# Patient Record
Sex: Male | Born: 1935 | Race: White | Hispanic: No | Marital: Married | Smoking: Former smoker
Health system: Southern US, Community
[De-identification: ages and names within clinical notes are randomized; demographics above are authoritative.]

## PROBLEM LIST (undated history)

## (undated) DIAGNOSIS — I10 Essential (primary) hypertension: Secondary | ICD-10-CM

## (undated) DIAGNOSIS — E119 Type 2 diabetes mellitus without complications: Secondary | ICD-10-CM

## (undated) DIAGNOSIS — I9789 Other postprocedural complications and disorders of the circulatory system, not elsewhere classified: Secondary | ICD-10-CM

## (undated) DIAGNOSIS — C61 Malignant neoplasm of prostate: Secondary | ICD-10-CM

## (undated) DIAGNOSIS — G459 Transient cerebral ischemic attack, unspecified: Secondary | ICD-10-CM

## (undated) DIAGNOSIS — K219 Gastro-esophageal reflux disease without esophagitis: Secondary | ICD-10-CM

## (undated) DIAGNOSIS — E1142 Type 2 diabetes mellitus with diabetic polyneuropathy: Secondary | ICD-10-CM

## (undated) DIAGNOSIS — E162 Hypoglycemia, unspecified: Secondary | ICD-10-CM

## (undated) DIAGNOSIS — M1990 Unspecified osteoarthritis, unspecified site: Secondary | ICD-10-CM

## (undated) DIAGNOSIS — I2510 Atherosclerotic heart disease of native coronary artery without angina pectoris: Secondary | ICD-10-CM

## (undated) DIAGNOSIS — I639 Cerebral infarction, unspecified: Secondary | ICD-10-CM

## (undated) DIAGNOSIS — I4891 Unspecified atrial fibrillation: Secondary | ICD-10-CM

## (undated) DIAGNOSIS — E785 Hyperlipidemia, unspecified: Secondary | ICD-10-CM

## (undated) HISTORY — DX: Gastro-esophageal reflux disease without esophagitis: K21.9

## (undated) HISTORY — DX: Atherosclerotic heart disease of native coronary artery without angina pectoris: I25.10

## (undated) HISTORY — PX: CATARACT EXTRACTION W/ INTRAOCULAR LENS  IMPLANT, BILATERAL: SHX1307

## (undated) HISTORY — DX: Hyperlipidemia, unspecified: E78.5

## (undated) HISTORY — PX: TONSILLECTOMY: SUR1361

## (undated) HISTORY — DX: Essential (primary) hypertension: I10

---

## 2015-07-16 HISTORY — PX: CORONARY ARTERY BYPASS GRAFT: SHX141

## 2015-07-16 HISTORY — PX: CARDIAC CATHETERIZATION: SHX172

## 2016-07-16 DIAGNOSIS — E119 Type 2 diabetes mellitus without complications: Secondary | ICD-10-CM | POA: Diagnosis not present

## 2016-08-08 DIAGNOSIS — R269 Unspecified abnormalities of gait and mobility: Secondary | ICD-10-CM | POA: Diagnosis not present

## 2016-08-12 DIAGNOSIS — E119 Type 2 diabetes mellitus without complications: Secondary | ICD-10-CM | POA: Diagnosis not present

## 2016-08-13 DIAGNOSIS — R269 Unspecified abnormalities of gait and mobility: Secondary | ICD-10-CM | POA: Diagnosis not present

## 2016-08-19 DIAGNOSIS — E119 Type 2 diabetes mellitus without complications: Secondary | ICD-10-CM | POA: Diagnosis not present

## 2016-08-19 DIAGNOSIS — E7800 Pure hypercholesterolemia, unspecified: Secondary | ICD-10-CM | POA: Diagnosis not present

## 2016-08-20 DIAGNOSIS — R269 Unspecified abnormalities of gait and mobility: Secondary | ICD-10-CM | POA: Diagnosis not present

## 2016-08-22 DIAGNOSIS — R269 Unspecified abnormalities of gait and mobility: Secondary | ICD-10-CM | POA: Diagnosis not present

## 2016-08-26 DIAGNOSIS — R269 Unspecified abnormalities of gait and mobility: Secondary | ICD-10-CM | POA: Diagnosis not present

## 2016-08-30 DIAGNOSIS — R269 Unspecified abnormalities of gait and mobility: Secondary | ICD-10-CM | POA: Diagnosis not present

## 2016-09-03 DIAGNOSIS — R269 Unspecified abnormalities of gait and mobility: Secondary | ICD-10-CM | POA: Diagnosis not present

## 2016-09-04 DIAGNOSIS — H905 Unspecified sensorineural hearing loss: Secondary | ICD-10-CM | POA: Diagnosis not present

## 2016-09-04 DIAGNOSIS — R42 Dizziness and giddiness: Secondary | ICD-10-CM | POA: Diagnosis not present

## 2016-09-05 DIAGNOSIS — R269 Unspecified abnormalities of gait and mobility: Secondary | ICD-10-CM | POA: Diagnosis not present

## 2016-09-10 DIAGNOSIS — R269 Unspecified abnormalities of gait and mobility: Secondary | ICD-10-CM | POA: Diagnosis not present

## 2016-09-12 DIAGNOSIS — R269 Unspecified abnormalities of gait and mobility: Secondary | ICD-10-CM | POA: Diagnosis not present

## 2016-09-18 DIAGNOSIS — R269 Unspecified abnormalities of gait and mobility: Secondary | ICD-10-CM | POA: Diagnosis not present

## 2016-09-20 DIAGNOSIS — R269 Unspecified abnormalities of gait and mobility: Secondary | ICD-10-CM | POA: Diagnosis not present

## 2016-09-23 DIAGNOSIS — R269 Unspecified abnormalities of gait and mobility: Secondary | ICD-10-CM | POA: Diagnosis not present

## 2016-09-25 DIAGNOSIS — R269 Unspecified abnormalities of gait and mobility: Secondary | ICD-10-CM | POA: Diagnosis not present

## 2016-09-30 DIAGNOSIS — R269 Unspecified abnormalities of gait and mobility: Secondary | ICD-10-CM | POA: Diagnosis not present

## 2016-10-01 DIAGNOSIS — H818X3 Other disorders of vestibular function, bilateral: Secondary | ICD-10-CM | POA: Diagnosis not present

## 2016-10-02 DIAGNOSIS — R269 Unspecified abnormalities of gait and mobility: Secondary | ICD-10-CM | POA: Diagnosis not present

## 2016-10-04 DIAGNOSIS — H905 Unspecified sensorineural hearing loss: Secondary | ICD-10-CM | POA: Diagnosis not present

## 2016-10-04 DIAGNOSIS — R42 Dizziness and giddiness: Secondary | ICD-10-CM | POA: Diagnosis not present

## 2016-10-08 DIAGNOSIS — R269 Unspecified abnormalities of gait and mobility: Secondary | ICD-10-CM | POA: Diagnosis not present

## 2016-10-08 DIAGNOSIS — E0842 Diabetes mellitus due to underlying condition with diabetic polyneuropathy: Secondary | ICD-10-CM | POA: Diagnosis not present

## 2016-10-08 DIAGNOSIS — R413 Other amnesia: Secondary | ICD-10-CM | POA: Diagnosis not present

## 2016-10-09 DIAGNOSIS — R269 Unspecified abnormalities of gait and mobility: Secondary | ICD-10-CM | POA: Diagnosis not present

## 2016-12-05 DIAGNOSIS — I10 Essential (primary) hypertension: Secondary | ICD-10-CM | POA: Diagnosis not present

## 2016-12-05 DIAGNOSIS — I2510 Atherosclerotic heart disease of native coronary artery without angina pectoris: Secondary | ICD-10-CM | POA: Diagnosis not present

## 2016-12-05 DIAGNOSIS — E119 Type 2 diabetes mellitus without complications: Secondary | ICD-10-CM | POA: Diagnosis not present

## 2016-12-06 DIAGNOSIS — R300 Dysuria: Secondary | ICD-10-CM | POA: Diagnosis not present

## 2016-12-11 DIAGNOSIS — I10 Essential (primary) hypertension: Secondary | ICD-10-CM | POA: Diagnosis not present

## 2016-12-11 DIAGNOSIS — I480 Paroxysmal atrial fibrillation: Secondary | ICD-10-CM | POA: Diagnosis not present

## 2016-12-11 DIAGNOSIS — E782 Mixed hyperlipidemia: Secondary | ICD-10-CM | POA: Diagnosis not present

## 2016-12-11 DIAGNOSIS — I25119 Atherosclerotic heart disease of native coronary artery with unspecified angina pectoris: Secondary | ICD-10-CM | POA: Diagnosis not present

## 2017-01-02 DIAGNOSIS — I2510 Atherosclerotic heart disease of native coronary artery without angina pectoris: Secondary | ICD-10-CM | POA: Diagnosis not present

## 2017-01-02 DIAGNOSIS — I10 Essential (primary) hypertension: Secondary | ICD-10-CM | POA: Diagnosis not present

## 2017-01-02 DIAGNOSIS — E119 Type 2 diabetes mellitus without complications: Secondary | ICD-10-CM | POA: Diagnosis not present

## 2017-01-28 DIAGNOSIS — N401 Enlarged prostate with lower urinary tract symptoms: Secondary | ICD-10-CM | POA: Diagnosis not present

## 2017-01-28 DIAGNOSIS — C61 Malignant neoplasm of prostate: Secondary | ICD-10-CM | POA: Diagnosis not present

## 2017-01-28 DIAGNOSIS — R351 Nocturia: Secondary | ICD-10-CM | POA: Diagnosis not present

## 2017-03-25 DIAGNOSIS — Z23 Encounter for immunization: Secondary | ICD-10-CM | POA: Diagnosis not present

## 2017-03-25 DIAGNOSIS — K219 Gastro-esophageal reflux disease without esophagitis: Secondary | ICD-10-CM | POA: Diagnosis not present

## 2017-03-25 DIAGNOSIS — E785 Hyperlipidemia, unspecified: Secondary | ICD-10-CM | POA: Diagnosis not present

## 2017-03-25 DIAGNOSIS — E119 Type 2 diabetes mellitus without complications: Secondary | ICD-10-CM | POA: Diagnosis not present

## 2017-06-27 DIAGNOSIS — E119 Type 2 diabetes mellitus without complications: Secondary | ICD-10-CM | POA: Diagnosis not present

## 2017-06-27 DIAGNOSIS — E785 Hyperlipidemia, unspecified: Secondary | ICD-10-CM | POA: Diagnosis not present

## 2017-06-27 DIAGNOSIS — I2510 Atherosclerotic heart disease of native coronary artery without angina pectoris: Secondary | ICD-10-CM | POA: Diagnosis not present

## 2017-08-11 DIAGNOSIS — I25810 Atherosclerosis of coronary artery bypass graft(s) without angina pectoris: Secondary | ICD-10-CM | POA: Diagnosis not present

## 2017-08-11 DIAGNOSIS — C61 Malignant neoplasm of prostate: Secondary | ICD-10-CM | POA: Diagnosis not present

## 2017-08-11 DIAGNOSIS — E119 Type 2 diabetes mellitus without complications: Secondary | ICD-10-CM | POA: Diagnosis not present

## 2017-08-11 DIAGNOSIS — Z7689 Persons encountering health services in other specified circumstances: Secondary | ICD-10-CM | POA: Diagnosis not present

## 2017-08-11 DIAGNOSIS — G629 Polyneuropathy, unspecified: Secondary | ICD-10-CM | POA: Diagnosis not present

## 2017-08-11 DIAGNOSIS — Z794 Long term (current) use of insulin: Secondary | ICD-10-CM | POA: Diagnosis not present

## 2017-11-11 DIAGNOSIS — E1165 Type 2 diabetes mellitus with hyperglycemia: Secondary | ICD-10-CM | POA: Diagnosis not present

## 2017-11-11 DIAGNOSIS — E7800 Pure hypercholesterolemia, unspecified: Secondary | ICD-10-CM | POA: Diagnosis not present

## 2017-11-11 DIAGNOSIS — E1142 Type 2 diabetes mellitus with diabetic polyneuropathy: Secondary | ICD-10-CM | POA: Diagnosis not present

## 2017-11-11 DIAGNOSIS — Z8546 Personal history of malignant neoplasm of prostate: Secondary | ICD-10-CM | POA: Diagnosis not present

## 2017-11-11 DIAGNOSIS — G629 Polyneuropathy, unspecified: Secondary | ICD-10-CM | POA: Diagnosis not present

## 2017-11-11 DIAGNOSIS — I25810 Atherosclerosis of coronary artery bypass graft(s) without angina pectoris: Secondary | ICD-10-CM | POA: Diagnosis not present

## 2017-11-11 DIAGNOSIS — Z794 Long term (current) use of insulin: Secondary | ICD-10-CM | POA: Diagnosis not present

## 2017-11-21 ENCOUNTER — Ambulatory Visit: Payer: Medicare Other | Admitting: Cardiology

## 2017-11-24 NOTE — Progress Notes (Signed)
Cardiology Office Note   Date:  11/27/2017   ID:  Andre Wolfe, DOB 1934/08/07, MRN [id number]  PCP:  [name], MD  Cardiologist:   Orton Capell Martinique, MD   Chief Complaint  Patient presents with  . New Patient (Initial Visit)      History of Present Illness: Andre Wolfe is a 83 y.o. male who presents for evaluation of CAD at the request of Dr. Jacelyn Grip. He moved recently from the [place] area to be closer to family. 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 In 2017 he began having angina and had an abnormal stress test. This led to cardiac cath and subsequent CABG in [place]. He had post op AFib and was on amiodarone for a few weeks and anticoagulated with Eliquis. He had DCCV. Apparently has no recurrence of Afib since then. Also has a history of TIAs ? Hospitalized in [place]. He states he has never really recovered post CABG but appears to be primarily limited by peripheral neuropathy. He has DM on insulin, HTN, HLD. EF is unknown. He denies any angina. No SOB. Complains mostly of lack of strength in legs and fatigue. No edema. Denies any palpitations. Notes imbalance with walking. He does have chronic dizziness. Still smokes a few cigarettes daily.    Past Medical History:  Diagnosis Date  . Arrhythmia    post op Afib.  . Cancer [facility])    prostate  . Coronary artery disease   . Diabetes mellitus without complication ([place])   . GERD (gastroesophageal reflux disease)   . Hyperlipidemia   . Hypertension     Past Surgical History:  Procedure Laterality Date  . CATARACT EXTRACTION    . CORONARY ARTERY BYPASS GRAFT  2017  . TONSILLECTOMY       Current Outpatient Medications  Medication Sig Dispense Refill  . amLODipine (NORVASC) 5 MG tablet   3  . [facility] OMEPRAZOLE PO Take 20 mg by mouth 4 (four) times daily.    Marland Kitchen ELIQUIS 5 MG TABS tablet   99  . gabapentin (NEURONTIN) 600 MG tablet   1  . losartan (COZAAR) 100 MG tablet Take 100 mg by mouth daily.    .  metoprolol tartrate (LOPRESSOR) 25 MG tablet   3  . NOVOLOG FLEXPEN 100 UNIT/ML FlexPen   0  . XULTOPHY 100-3.6 UNIT-MG/ML SOPN   5  . ezetimibe (ZETIA) 10 MG tablet Take 1 tablet (10 mg total) by mouth daily. 90 tablet 3   No current facility-administered medications for this visit.     Allergies:   Patient has no known allergies.    Social History:  The patient  reports that he has been smoking.  He has a 10.00 pack-year smoking history. He has never used smokeless tobacco. He reports that he drank alcohol.   Family History:  The patient's family history includes Breast cancer in his mother and sister; Diabetes in his brother; Heart disease in his mother; Leukemia in his father.    ROS:  Please see the history of present illness.   Otherwise, review of systems are positive for none.   All other systems are reviewed and negative.    PHYSICAL EXAM: VS:  BP 132/64 (BP Location: Right Arm)   Pulse 62   Ht 6' (1.829 m)   Wt 213 lb (96.6 kg)   BMI 28.89 kg/m  , BMI Body mass index is 28.89 kg/m. GEN: Well nourished, well  developed elderly WM, in no acute distress  HEENT: normal  Neck: no JVD, soft left carotid bruit, or masses Cardiac: RRR; no murmurs, rubs, or gallops,no edema. Median sternotomy scar Respiratory:  clear to auscultation bilaterally, normal work of breathing GI: soft, nontender, nondistended, + BS MS: no deformity or atrophy  Skin: warm and dry, no rash Neuro:  Strength and sensation are intact Psych: euthymic mood, full affect   EKG:  EKG is ordered today. The ekg ordered today demonstrates NSR rate 62. biatrial enlargement. Septal infarct age undetermined. I have personally reviewed and interpreted this study.    Recent Labs: No results found for requested labs within last 8760 hours.    Lipid Panel No results found for: CHOL, TRIG, HDL, CHOLHDL, VLDL, LDLCALC, LDLDIRECT    Wt Readings from Last 3 Encounters:  11/27/17 213 lb (96.6 kg)    Dated  11/11/17: cholesterol 215, triglycerides 209, HDL 32, LDL 141. A1c 8.4%. Chemistries normal.  Other studies Reviewed: Additional studies/ records that were reviewed today include: none   ASSESSMENT AND PLAN:  1.  CAD. S/p CABG x 4 in 2017. Details unknown. EF unknown. No current angina. On Metoprolol and amlodipine. Not on statin due to intolerance. Will request records from [facility] hospital  2. Post operative Afib. No clear recurrence. Will request records from his prior cardiologist. If he truly only had post op Afib without recurrence we may be able to stop Eliquis. Until I have more information will continue Eliquis for now but hold ASA.   3. History of TIAs. Extent of work up unclear. Again request old records for any head scans, carotid dopplers. Echo etc.   4. HTN controlled.  5. HLD. Goal LDL <70. Intolerant of statins. He has been reluctant to take Zetia because of prior drug reactions. Explained that this is different than statins and encouraged him to take.  6. DM on insulin with peripheral neuropathy. Per primary care.   7. Tobacco use- recommend complete cessation.    Current medicines are reviewed at length with the patient today.  The patient does not have concerns regarding medicines.  The following changes have been made:  Add Zetia 10 mg daily. Stop ASA.   Labs/ tests ordered today include: none   Old records requested.  Orders Placed This Encounter  Procedures  . EKG 12-Lead     Disposition:   FU with me in 3 months  Signed, Kurk Corniel Martinique, MD  11/27/2017 4:45 PM    [facility] [address], [place], [place], [address] Phone [phone], Fax [phone]

## 2017-11-27 ENCOUNTER — Ambulatory Visit (INDEPENDENT_AMBULATORY_CARE_PROVIDER_SITE_OTHER): Payer: Medicare Other | Admitting: Cardiology

## 2017-11-27 ENCOUNTER — Encounter: Payer: Self-pay | Admitting: Cardiology

## 2017-11-27 VITALS — BP 132/64 | HR 62 | Ht 72.0 in | Wt 213.0 lb

## 2017-11-27 DIAGNOSIS — Z794 Long term (current) use of insulin: Secondary | ICD-10-CM

## 2017-11-27 DIAGNOSIS — E7800 Pure hypercholesterolemia, unspecified: Secondary | ICD-10-CM

## 2017-11-27 DIAGNOSIS — I25810 Atherosclerosis of coronary artery bypass graft(s) without angina pectoris: Secondary | ICD-10-CM | POA: Diagnosis not present

## 2017-11-27 DIAGNOSIS — I480 Paroxysmal atrial fibrillation: Secondary | ICD-10-CM | POA: Diagnosis not present

## 2017-11-27 DIAGNOSIS — E1142 Type 2 diabetes mellitus with diabetic polyneuropathy: Secondary | ICD-10-CM | POA: Diagnosis not present

## 2017-11-27 DIAGNOSIS — I10 Essential (primary) hypertension: Secondary | ICD-10-CM | POA: Diagnosis not present

## 2017-11-27 MED ORDER — EZETIMIBE 10 MG PO TABS: 10.0000 mg | ORAL_TABLET | Freq: Every day | ORAL | 3 refills | Status: DC

## 2017-11-27 NOTE — Patient Instructions (Signed)
Stop taking ASA now  Start Zetia 10 mg daily for cholesterol   We will request your prior records  I will follow up in 3 months.

## 2018-01-22 DIAGNOSIS — I10 Essential (primary) hypertension: Secondary | ICD-10-CM | POA: Diagnosis not present

## 2018-01-22 DIAGNOSIS — E1165 Type 2 diabetes mellitus with hyperglycemia: Secondary | ICD-10-CM | POA: Diagnosis not present

## 2018-01-22 DIAGNOSIS — G629 Polyneuropathy, unspecified: Secondary | ICD-10-CM | POA: Diagnosis not present

## 2018-01-22 DIAGNOSIS — E7800 Pure hypercholesterolemia, unspecified: Secondary | ICD-10-CM | POA: Diagnosis not present

## 2018-03-17 ENCOUNTER — Encounter: Payer: Self-pay | Admitting: Cardiology

## 2018-03-17 DIAGNOSIS — E7800 Pure hypercholesterolemia, unspecified: Secondary | ICD-10-CM | POA: Diagnosis not present

## 2018-03-17 DIAGNOSIS — I25810 Atherosclerosis of coronary artery bypass graft(s) without angina pectoris: Secondary | ICD-10-CM | POA: Diagnosis not present

## 2018-03-17 DIAGNOSIS — G629 Polyneuropathy, unspecified: Secondary | ICD-10-CM | POA: Diagnosis not present

## 2018-03-17 DIAGNOSIS — R29898 Other symptoms and signs involving the musculoskeletal system: Secondary | ICD-10-CM | POA: Diagnosis not present

## 2018-03-17 DIAGNOSIS — R152 Fecal urgency: Secondary | ICD-10-CM | POA: Diagnosis not present

## 2018-03-17 DIAGNOSIS — C61 Malignant neoplasm of prostate: Secondary | ICD-10-CM | POA: Diagnosis not present

## 2018-03-17 DIAGNOSIS — Z794 Long term (current) use of insulin: Secondary | ICD-10-CM | POA: Diagnosis not present

## 2018-03-17 DIAGNOSIS — E1142 Type 2 diabetes mellitus with diabetic polyneuropathy: Secondary | ICD-10-CM | POA: Diagnosis not present

## 2018-03-23 NOTE — Progress Notes (Signed)
Cardiology Office Note   Date:  03/26/2018   ID:  Andre Wolfe, DOB Nov 04, 1934, MRN [id number]  PCP:  [name], MD  Cardiologist:   [name], MD   Chief Complaint  Patient presents with  . Follow-up    denies chest pains, denies SOB, denies swelling in hands/feet  . Coronary Artery Disease      History of Present Illness: Andre Wolfe is a 83 y.o. male who is seen for follow up  of CAD. He moved  from the [place] area to be closer to family. 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 In 2017 he began having angina and had an abnormal stress test. This led to cardiac cath and subsequent CABG in [place] on 01/01/16. This was done off pump and included LIMA to the LAD, SVG to  [place] intermediate and SVG sequentially to OM1 and left posterolateral branch as a Y graft off the other SVG. He had post op AFib and was on amiodarone for a few weeks and anticoagulated with Eliquis. He had DCCV day 4 post op. Apparently has no recurrence of Afib since then. EF noted to be mildly reduced on Op note. No cath report or Echo available.   Also has a history of ? TIAs.  Hospitalized in [place] in October 2017. Had generalized leg weakness. Was in NSR then. CT head was normal. No other work up.    He states he has never really recovered post CABG but appears to be primarily limited by peripheral neuropathy. He has DM on insulin, HTN, HLD.  He denies any angina. No SOB. Occasional indigestion relieved with belching. History of intolerance to statins due to myalgias- states he has tried them all. On Zetia now. Notes some diarrhea with this about once a week.  Has poor strength in legs and fatigue. No edema. Denies any palpitations. Notes imbalance with walking. He does have chronic dizziness. Still smokes a few cigarettes daily.    Past Medical History:  Diagnosis Date  . Arrhythmia    post op Afib.  . Cancer [facility])    prostate  . Coronary artery disease   . Diabetes  mellitus without complication ([place])   . GERD (gastroesophageal reflux disease)   . Hyperlipidemia   . Hypertension   . Peripheral neuropathy     Past Surgical History:  Procedure Laterality Date  . CATARACT EXTRACTION    . CORONARY ARTERY BYPASS GRAFT  2017  . TONSILLECTOMY       Current Outpatient Medications  Medication Sig Dispense Refill  . amLODipine (NORVASC) 5 MG tablet   3  . [facility]S OMEPRAZOLE PO Take 20 mg by mouth 4 (four) times daily.    Marland Kitchen ELIQUIS 5 MG TABS tablet   99  . ezetimibe (ZETIA) 10 MG tablet Take 1 tablet (10 mg total) by mouth daily. 90 tablet 3  . gabapentin (NEURONTIN) 600 MG tablet   1  . losartan (COZAAR) 100 MG tablet Take 100 mg by mouth daily.    . metoprolol tartrate (LOPRESSOR) 25 MG tablet   3  . NOVOLOG FLEXPEN 100 UNIT/ML FlexPen   0  . XULTOPHY 100-3.6 UNIT-MG/ML SOPN   5   No current facility-administered medications for this visit.     Allergies:   Patient has no known allergies.    Social History:  The patient  reports that he has been smoking. He has a 10.00 pack-year smoking history.  He has never used smokeless tobacco. He reports that he drank alcohol.   Family History:  The patient's family history includes Breast cancer in his mother and sister; Diabetes in his brother; Heart disease in his mother; Leukemia in his father.    ROS:  Please see the history of present illness.   Otherwise, review of systems are positive for none.   All other systems are reviewed and negative.    PHYSICAL EXAM: VS:  BP 124/72 (BP Location: Left Arm, Patient Position: Sitting)   Pulse 63   Ht 5\' 11"  (1.803 m)   Wt 209 lb 6.4 oz (95 kg)   SpO2 96%   BMI 29.21 kg/m  , BMI Body mass index is 29.21 kg/m. GENERAL:  Well appearing WM in NAD HEENT:  PERRL, EOMI, sclera are clear. Oropharynx is clear. NECK:  No jugular venous distention, carotid upstroke brisk and symmetric, no bruits, no thyromegaly or adenopathy LUNGS:  Clear to auscultation  bilaterally CHEST:  Unremarkable HEART:  RRR,  PMI not displaced or sustained,S1 and S2 within normal limits, no S3, no S4: no clicks, no rubs, no murmurs ABD:  Soft, nontender. BS +, no masses or bruits. No hepatomegaly, no splenomegaly EXT:  2 + pulses throughout, no edema, no cyanosis no clubbing SKIN:  Warm and dry.  No rashes NEURO:  Alert and oriented x 3. Cranial nerves II through XII intact. PSYCH:  Cognitively intact  EKG:  EKG is not ordered today.   Recent Labs: No results found for requested labs within last 8760 hours.    Lipid Panel No results found for: CHOL, TRIG, HDL, CHOLHDL, VLDL, LDLCALC, LDLDIRECT    Wt Readings from Last 3 Encounters:  03/26/18 209 lb 6.4 oz (95 kg)  11/27/17 213 lb (96.6 kg)    Dated 11/11/17: cholesterol 215, triglycerides 209, HDL 32, LDL 141. A1c 8.4%. Chemistries normal. Dated 03/17/18: cholesterol 183, triglycerides 189, HDL 33, LDL 112.   Other studies Reviewed: Additional studies/ records that were reviewed today include: none   ASSESSMENT AND PLAN:  1.  CAD. S/p CABG x 4 in 2017. See above.  No current angina. On Metoprolol and amlodipine. Not on statin due to intolerance. Will resume ASA 81 mg daily.  2. Post operative Afib. No recurrence. Will stop Eliquis now and resume ASA.  3. Peripheral neuropathy.  4. HTN controlled. Will try and reduce amlodipine to 2.5 mg daily and monitor.   5. HLD. Goal LDL <70. Intolerant of statins. LDL reduced 141>>112 on Zetia. Some diarrhea noted with this. Still significant residual risk. Will have him see Pharm D to see if he can get started on PCSK 9 inhibitor.   6. DM on insulin with peripheral neuropathy. Per primary care.   7. Tobacco use- recommend complete cessation.    Disposition:   FU with me in 6 months  Signed, Andre Elsen Martinique, MD  03/26/2018 1:47 PM    [facility] [address] [place], [place], [address] Phone [phone], Fax [phone]

## 2018-03-26 ENCOUNTER — Ambulatory Visit (INDEPENDENT_AMBULATORY_CARE_PROVIDER_SITE_OTHER): Payer: Medicare Other | Admitting: Cardiology

## 2018-03-26 ENCOUNTER — Encounter: Payer: Self-pay | Admitting: Cardiology

## 2018-03-26 VITALS — BP 124/72 | HR 63 | Ht 71.0 in | Wt 209.4 lb

## 2018-03-26 DIAGNOSIS — E7800 Pure hypercholesterolemia, unspecified: Secondary | ICD-10-CM

## 2018-03-26 DIAGNOSIS — Z794 Long term (current) use of insulin: Secondary | ICD-10-CM

## 2018-03-26 DIAGNOSIS — I10 Essential (primary) hypertension: Secondary | ICD-10-CM | POA: Diagnosis not present

## 2018-03-26 DIAGNOSIS — I25810 Atherosclerosis of coronary artery bypass graft(s) without angina pectoris: Secondary | ICD-10-CM | POA: Diagnosis not present

## 2018-03-26 DIAGNOSIS — E1142 Type 2 diabetes mellitus with diabetic polyneuropathy: Secondary | ICD-10-CM | POA: Diagnosis not present

## 2018-03-26 NOTE — Patient Instructions (Addendum)
Stop Eliquis   Start ASA 81 mg daily  We will have you see our Pharm D to consider cholesterol lowering therapy  Reduce amlodipine to 2.5 mg daily  Follow up in 6 months

## 2018-03-31 ENCOUNTER — Ambulatory Visit: Payer: Medicare Other

## 2018-04-23 DIAGNOSIS — R5383 Other fatigue: Secondary | ICD-10-CM | POA: Diagnosis not present

## 2018-04-23 DIAGNOSIS — I2510 Atherosclerotic heart disease of native coronary artery without angina pectoris: Secondary | ICD-10-CM | POA: Diagnosis not present

## 2018-04-30 ENCOUNTER — Ambulatory Visit (INDEPENDENT_AMBULATORY_CARE_PROVIDER_SITE_OTHER): Payer: Medicare Other | Admitting: Pharmacist

## 2018-04-30 DIAGNOSIS — I25810 Atherosclerosis of coronary artery bypass graft(s) without angina pectoris: Secondary | ICD-10-CM | POA: Diagnosis not present

## 2018-04-30 DIAGNOSIS — E7800 Pure hypercholesterolemia, unspecified: Secondary | ICD-10-CM | POA: Diagnosis not present

## 2018-04-30 NOTE — Progress Notes (Signed)
Patient ID: Andre Wolfe                 DOB: Jul 07, 1935                    MRN: [id number]     HPI: Andre Wolfe is a 83 y.o. male patient referred to lipid clinic by Dr Martinique. PMH is significant for CAD s/p CABG, peripheral neuropathy, DM-II, hypertension, and hyperlipidemia. Noted history of statin intolerance due to severe myalgias. Patient presents to clinic for potential PCSK9i therapy initiation.   Current Medications:  Ezetimibe 10mg  daily  Intolerances:  lipitor (atorvastatin) daily - muscle weakness and arm pain crestor (rosuvsatatin) daily Zocor (simvastatin) daily  LDL goal: 70mg /dL  Diet: egg x 2 week or toast for breakfast; likes bacon 2-3 times per week, fisk mainly fried  Exercise: limited physical activity due to back pain. Waiting to start PT soon  Family History: high cholesterol in brother   Social History: smoke 3/cigarete per day; no alcohol  Labs: 03/17/2018: CHO 183; TG 189; HDL 33; LDL-c 112 (on ezetimibe 10mg )  Past Medical History:  Diagnosis Date  . Arrhythmia    post op Afib.  . Cancer [facility])    prostate  . Coronary artery disease   . Diabetes mellitus without complication ([place])   . GERD (gastroesophageal reflux disease)   . Hyperlipidemia   . Hypertension   . Peripheral neuropathy     Current Outpatient Medications on File Prior to Visit  Medication Sig Dispense Refill  . amLODipine (NORVASC) 5 MG tablet   3  . [facility]S OMEPRAZOLE PO Take 20 mg by mouth 4 (four) times daily.    Marland Kitchen ELIQUIS 5 MG TABS tablet   99  . ezetimibe (ZETIA) 10 MG tablet Take 1 tablet (10 mg total) by mouth daily. 90 tablet 3  . gabapentin (NEURONTIN) 600 MG tablet   1  . losartan (COZAAR) 100 MG tablet Take 100 mg by mouth daily.    . metoprolol tartrate (LOPRESSOR) 25 MG tablet   3  . NOVOLOG FLEXPEN 100 UNIT/ML FlexPen   0  . XULTOPHY 100-3.6 UNIT-MG/ML SOPN   5   No current facility-administered medications on file prior to visit.     No Known  Allergies  Hypercholesterolemia LDL remains above goal for secondary prevention. We spent long time discussing positive lifestyle modifications like increase physical activity and decrease saturated fat in diet. Patient and daughter currently working on positive lifestyle modifications and willing to continue improvement as recommended.   Patient is a good candidate for PCSK9i therapy and willing to start product ASAP. We need to wait until new insurance active but patient will notify clinic and bring new card as soon as available.  PCSK9i Repatha/Praluent indication, MOA, prior authorization process, patient assistance program, common side effects, storage, and administration were discussed during this visit. All questions answered and patient expressed understating.   Andre Wolfe PharmD, BCPS, [place] [address] [place] [address] 05/04/2018 3:54 PM

## 2018-04-30 NOTE — Patient Instructions (Signed)
Lipid Clinic (pharmacy) [phone] Ellana Kawa/Kristin  *START paperwork for Repatha/Praluent*   Cholesterol Cholesterol is a fat. Your body needs a small amount of cholesterol. Cholesterol (plaque) may build up in your blood vessels (arteries). That makes you more likely to have a heart attack or stroke. You cannot feel your cholesterol level. Having a blood test is the only way to find out if your level is high. Keep your test results. Work with your doctor to keep your cholesterol at a good level. What do the results mean?  Total cholesterol is how much cholesterol is in your blood.  LDL is bad cholesterol. This is the type that can build up. Try to have low LDL.  HDL is good cholesterol. It cleans your blood vessels and carries LDL away. Try to have high HDL.  Triglycerides are fat that the body can store or burn for energy. What are good levels of cholesterol?  Total cholesterol below 200.  LDL below 100 is good for people who have health risks. LDL below 70 is good for people who have very high risks.  HDL above 40 is good. It is best to have HDL of 60 or higher.  Triglycerides below 150. How can I lower my cholesterol? Diet Follow your diet program as told by your doctor.  Choose fish, white meat chicken, or [place] that is roasted or baked. Try not to eat red meat, fried foods, sausage, or lunch meats.  Eat lots of fresh fruits and vegetables.  Choose whole grains, beans, pasta, potatoes, and cereals.  Choose olive oil, corn oil, or canola oil. Only use small amounts.  Try not to eat butter, mayonnaise, shortening, or palm kernel oils.  Try not to eat foods with trans fats.  Choose low-fat or nonfat dairy foods. ? Drink skim or nonfat milk. ? Eat low-fat or nonfat yogurt and cheeses. ? Try not to drink whole milk or cream. ? Try not to eat ice cream, egg yolks, or full-fat cheeses.  Healthy desserts include angel food cake, ginger snaps, animal crackers, hard  candy, popsicles, and low-fat or nonfat frozen yogurt. Try not to eat pastries, cakes, pies, and cookies.  Exercise Follow your exercise program as told by your doctor.  Be more active. Try gardening, walking, and taking the stairs.  Ask your doctor about ways that you can be more active.  Medicine  Take over-the-counter and prescription medicines only as told by your doctor. This information is not intended to replace advice given to you by your health care provider. Make sure you discuss any questions you have with your health care provider. Document Released: 09/27/2008 Document Revised: 01/31/2016 Document Reviewed: 01/11/2016 Elsevier Interactive Patient Education  [employer].

## 2018-05-04 ENCOUNTER — Encounter: Payer: Self-pay | Admitting: Pharmacist

## 2018-05-04 DIAGNOSIS — E7800 Pure hypercholesterolemia, unspecified: Secondary | ICD-10-CM | POA: Insufficient documentation

## 2018-05-04 NOTE — Assessment & Plan Note (Signed)
LDL remains above goal for secondary prevention. We spent long time discussing positive lifestyle modifications like increase physical activity and decrease saturated fat in diet. Patient and daughter currently working on positive lifestyle modifications and willing to continue improvement as recommended.   Patient is a good candidate for PCSK9i therapy and willing to start product ASAP. We need to wait until new insurance active but patient will notify clinic and bring new card as soon as available.  PCSK9i Repatha/Praluent indication, MOA, prior authorization process, patient assistance program, common side effects, storage, and administration were discussed during this visit. All questions answered and patient expressed understating.

## 2018-05-25 ENCOUNTER — Other Ambulatory Visit: Payer: Self-pay | Admitting: Cardiology

## 2018-05-25 DIAGNOSIS — I10 Essential (primary) hypertension: Secondary | ICD-10-CM | POA: Diagnosis not present

## 2018-05-25 DIAGNOSIS — E1165 Type 2 diabetes mellitus with hyperglycemia: Secondary | ICD-10-CM | POA: Diagnosis not present

## 2018-05-25 DIAGNOSIS — G629 Polyneuropathy, unspecified: Secondary | ICD-10-CM | POA: Diagnosis not present

## 2018-05-25 DIAGNOSIS — E7800 Pure hypercholesterolemia, unspecified: Secondary | ICD-10-CM | POA: Diagnosis not present

## 2018-05-25 MED ORDER — AMLODIPINE BESYLATE 5 MG PO TABS: 2.5000 mg | ORAL_TABLET | Freq: Every day | ORAL | 3 refills | Status: DC

## 2018-05-25 NOTE — Telephone Encounter (Signed)
°*  STAT* If patient is at the pharmacy, call can be transferred to refill team.   1. Which medications need to be refilled? (please list name of each medication and dose if known)   amLODipine (NORVASC) 2.5 MG tablet   2. Which pharmacy/location (including street and city if local pharmacy) is medication to be sent to?   [place], [place] - [address]  3. Do they need a 30 day or 90 day supply? 90 days

## 2018-05-27 ENCOUNTER — Telehealth: Payer: Self-pay | Admitting: Cardiology

## 2018-05-27 MED ORDER — AMLODIPINE BESYLATE 2.5 MG PO TABS: 2.5000 mg | ORAL_TABLET | Freq: Every day | ORAL | 3 refills | Status: DC

## 2018-05-27 NOTE — Telephone Encounter (Signed)
Informed patient's daughter - prescription amlodipine 2.5 mg daily--- e-sent to pharmacy

## 2018-05-27 NOTE — Telephone Encounter (Signed)
New message  Pt c/o medication issue:  1. Name of Medication: amLODipine (NORVASC) 5 MG tablet  2. How are you currently taking this medication (dosage and times per day)? 1 time daily  3. Are you having a reaction (difficulty breathing--STAT)? No   4. What is your medication issue? Patient's daughter states that need a new prescription for a 2.5 mg patient was previously taking 5 mg

## 2018-06-26 ENCOUNTER — Telehealth: Payer: Self-pay | Admitting: Pharmacist

## 2018-06-26 MED ORDER — EVOLOCUMAB 140 MG/ML ~~LOC~~ SOAJ: 140.0000 mg | SUBCUTANEOUS | 11 refills | Status: AC

## 2018-06-26 NOTE — Telephone Encounter (Signed)
PA for Repatha approved, Rx sent to [facility]. Patient to call back if unable to afford co-pay. Option for patient assistance already discussed with patient.

## 2018-07-13 ENCOUNTER — Encounter (HOSPITAL_COMMUNITY): Payer: Medicare Other

## 2018-07-13 ENCOUNTER — Other Ambulatory Visit: Payer: Self-pay

## 2018-07-13 ENCOUNTER — Inpatient Hospital Stay (HOSPITAL_COMMUNITY)
Admission: EM | Admit: 2018-07-13 | Discharge: 2018-07-16 | DRG: 065 | Disposition: A | Discharge status: Rehabilitation Facility | Payer: Medicare Other | Attending: Internal Medicine | Admitting: Internal Medicine

## 2018-07-13 ENCOUNTER — Emergency Department (HOSPITAL_COMMUNITY): Payer: Medicare Other

## 2018-07-13 ENCOUNTER — Encounter (HOSPITAL_COMMUNITY): Payer: Self-pay | Admitting: Emergency Medicine

## 2018-07-13 ENCOUNTER — Other Ambulatory Visit (HOSPITAL_COMMUNITY): Payer: Medicare Other

## 2018-07-13 DIAGNOSIS — Z720 Tobacco use: Secondary | ICD-10-CM | POA: Diagnosis not present

## 2018-07-13 DIAGNOSIS — I639 Cerebral infarction, unspecified: Secondary | ICD-10-CM

## 2018-07-13 DIAGNOSIS — Z9181 History of falling: Secondary | ICD-10-CM

## 2018-07-13 DIAGNOSIS — I63511 Cerebral infarction due to unspecified occlusion or stenosis of right middle cerebral artery: Secondary | ICD-10-CM | POA: Diagnosis not present

## 2018-07-13 DIAGNOSIS — I2510 Atherosclerotic heart disease of native coronary artery without angina pectoris: Secondary | ICD-10-CM | POA: Diagnosis present

## 2018-07-13 DIAGNOSIS — F17200 Nicotine dependence, unspecified, uncomplicated: Secondary | ICD-10-CM | POA: Diagnosis present

## 2018-07-13 DIAGNOSIS — R402253 Coma scale, best verbal response, oriented, at hospital admission: Secondary | ICD-10-CM | POA: Diagnosis present

## 2018-07-13 DIAGNOSIS — E1151 Type 2 diabetes mellitus with diabetic peripheral angiopathy without gangrene: Secondary | ICD-10-CM | POA: Diagnosis present

## 2018-07-13 DIAGNOSIS — K219 Gastro-esophageal reflux disease without esophagitis: Secondary | ICD-10-CM | POA: Diagnosis present

## 2018-07-13 DIAGNOSIS — Z66 Do not resuscitate: Secondary | ICD-10-CM | POA: Diagnosis present

## 2018-07-13 DIAGNOSIS — I480 Paroxysmal atrial fibrillation: Secondary | ICD-10-CM | POA: Diagnosis present

## 2018-07-13 DIAGNOSIS — I6350 Cerebral infarction due to unspecified occlusion or stenosis of unspecified cerebral artery: Secondary | ICD-10-CM | POA: Diagnosis not present

## 2018-07-13 DIAGNOSIS — Z8546 Personal history of malignant neoplasm of prostate: Secondary | ICD-10-CM

## 2018-07-13 DIAGNOSIS — I63233 Cerebral infarction due to unspecified occlusion or stenosis of bilateral carotid arteries: Secondary | ICD-10-CM | POA: Diagnosis not present

## 2018-07-13 DIAGNOSIS — E1142 Type 2 diabetes mellitus with diabetic polyneuropathy: Secondary | ICD-10-CM | POA: Diagnosis present

## 2018-07-13 DIAGNOSIS — I69322 Dysarthria following cerebral infarction: Secondary | ICD-10-CM | POA: Diagnosis not present

## 2018-07-13 DIAGNOSIS — I499 Cardiac arrhythmia, unspecified: Secondary | ICD-10-CM | POA: Diagnosis not present

## 2018-07-13 DIAGNOSIS — R001 Bradycardia, unspecified: Secondary | ICD-10-CM | POA: Diagnosis not present

## 2018-07-13 DIAGNOSIS — Z8249 Family history of ischemic heart disease and other diseases of the circulatory system: Secondary | ICD-10-CM | POA: Diagnosis not present

## 2018-07-13 DIAGNOSIS — R402143 Coma scale, eyes open, spontaneous, at hospital admission: Secondary | ICD-10-CM | POA: Diagnosis present

## 2018-07-13 DIAGNOSIS — I4430 Unspecified atrioventricular block: Secondary | ICD-10-CM | POA: Diagnosis not present

## 2018-07-13 DIAGNOSIS — Z6831 Body mass index (BMI) 31.0-31.9, adult: Secondary | ICD-10-CM

## 2018-07-13 DIAGNOSIS — I69398 Other sequelae of cerebral infarction: Secondary | ICD-10-CM | POA: Diagnosis not present

## 2018-07-13 DIAGNOSIS — Z806 Family history of leukemia: Secondary | ICD-10-CM | POA: Diagnosis not present

## 2018-07-13 DIAGNOSIS — I440 Atrioventricular block, first degree: Secondary | ICD-10-CM | POA: Diagnosis not present

## 2018-07-13 DIAGNOSIS — Z794 Long term (current) use of insulin: Secondary | ICD-10-CM | POA: Diagnosis not present

## 2018-07-13 DIAGNOSIS — R7309 Other abnormal glucose: Secondary | ICD-10-CM | POA: Diagnosis not present

## 2018-07-13 DIAGNOSIS — R29702 NIHSS score 2: Secondary | ICD-10-CM | POA: Diagnosis present

## 2018-07-13 DIAGNOSIS — R29810 Facial weakness: Secondary | ICD-10-CM | POA: Diagnosis present

## 2018-07-13 DIAGNOSIS — Z79899 Other long term (current) drug therapy: Secondary | ICD-10-CM

## 2018-07-13 DIAGNOSIS — R471 Dysarthria and anarthria: Secondary | ICD-10-CM | POA: Diagnosis present

## 2018-07-13 DIAGNOSIS — E1165 Type 2 diabetes mellitus with hyperglycemia: Secondary | ICD-10-CM | POA: Diagnosis present

## 2018-07-13 DIAGNOSIS — R402363 Coma scale, best motor response, obeys commands, at hospital admission: Secondary | ICD-10-CM | POA: Diagnosis present

## 2018-07-13 DIAGNOSIS — I63533 Cerebral infarction due to unspecified occlusion or stenosis of bilateral posterior cerebral arteries: Secondary | ICD-10-CM | POA: Diagnosis not present

## 2018-07-13 DIAGNOSIS — E119 Type 2 diabetes mellitus without complications: Secondary | ICD-10-CM | POA: Diagnosis not present

## 2018-07-13 DIAGNOSIS — R269 Unspecified abnormalities of gait and mobility: Secondary | ICD-10-CM | POA: Diagnosis not present

## 2018-07-13 DIAGNOSIS — E785 Hyperlipidemia, unspecified: Secondary | ICD-10-CM | POA: Diagnosis present

## 2018-07-13 DIAGNOSIS — I6340 Cerebral infarction due to embolism of unspecified cerebral artery: Principal | ICD-10-CM | POA: Diagnosis present

## 2018-07-13 DIAGNOSIS — R4781 Slurred speech: Secondary | ICD-10-CM | POA: Diagnosis present

## 2018-07-13 DIAGNOSIS — Z951 Presence of aortocoronary bypass graft: Secondary | ICD-10-CM

## 2018-07-13 DIAGNOSIS — G8194 Hemiplegia, unspecified affecting left nondominant side: Secondary | ICD-10-CM | POA: Diagnosis present

## 2018-07-13 DIAGNOSIS — R2681 Unsteadiness on feet: Secondary | ICD-10-CM | POA: Diagnosis present

## 2018-07-13 DIAGNOSIS — Z538 Procedure and treatment not carried out for other reasons: Secondary | ICD-10-CM | POA: Diagnosis not present

## 2018-07-13 DIAGNOSIS — M1990 Unspecified osteoarthritis, unspecified site: Secondary | ICD-10-CM | POA: Diagnosis present

## 2018-07-13 DIAGNOSIS — I10 Essential (primary) hypertension: Secondary | ICD-10-CM | POA: Diagnosis present

## 2018-07-13 DIAGNOSIS — E669 Obesity, unspecified: Secondary | ICD-10-CM | POA: Diagnosis present

## 2018-07-13 DIAGNOSIS — I69318 Other symptoms and signs involving cognitive functions following cerebral infarction: Secondary | ICD-10-CM | POA: Diagnosis not present

## 2018-07-13 DIAGNOSIS — Z833 Family history of diabetes mellitus: Secondary | ICD-10-CM | POA: Diagnosis not present

## 2018-07-13 DIAGNOSIS — F988 Other specified behavioral and emotional disorders with onset usually occurring in childhood and adolescence: Secondary | ICD-10-CM | POA: Diagnosis present

## 2018-07-13 DIAGNOSIS — I69354 Hemiplegia and hemiparesis following cerebral infarction affecting left non-dominant side: Secondary | ICD-10-CM | POA: Diagnosis present

## 2018-07-13 DIAGNOSIS — I6931 Attention and concentration deficit following cerebral infarction: Secondary | ICD-10-CM | POA: Diagnosis not present

## 2018-07-13 DIAGNOSIS — Z7982 Long term (current) use of aspirin: Secondary | ICD-10-CM

## 2018-07-13 DIAGNOSIS — Z803 Family history of malignant neoplasm of breast: Secondary | ICD-10-CM | POA: Diagnosis not present

## 2018-07-13 DIAGNOSIS — F17210 Nicotine dependence, cigarettes, uncomplicated: Secondary | ICD-10-CM | POA: Diagnosis present

## 2018-07-13 DIAGNOSIS — I361 Nonrheumatic tricuspid (valve) insufficiency: Secondary | ICD-10-CM | POA: Diagnosis not present

## 2018-07-13 HISTORY — DX: Malignant neoplasm of prostate: C61

## 2018-07-13 HISTORY — DX: Unspecified atrial fibrillation: I48.91

## 2018-07-13 HISTORY — DX: Other postprocedural complications and disorders of the circulatory system, not elsewhere classified: I97.89

## 2018-07-13 HISTORY — DX: Cerebral infarction, unspecified: I63.9

## 2018-07-13 HISTORY — DX: Type 2 diabetes mellitus without complications: E11.9

## 2018-07-13 HISTORY — DX: Transient cerebral ischemic attack, unspecified: G45.9

## 2018-07-13 HISTORY — DX: Type 2 diabetes mellitus with diabetic polyneuropathy: E11.42

## 2018-07-13 HISTORY — DX: Unspecified osteoarthritis, unspecified site: M19.90

## 2018-07-13 LAB — URINALYSIS, ROUTINE W REFLEX MICROSCOPIC
Bacteria, UA: NONE SEEN
Bilirubin Urine: NEGATIVE
Glucose, UA: >=500 mg/dL — AB
Hgb urine dipstick: NEGATIVE
Ketones, ur: NEGATIVE mg/dL
Leukocytes, UA: NEGATIVE
NITRITE: NEGATIVE
Protein, ur: 30 mg/dL — AB
Specific Gravity, Urine: 1.028 (ref 1.005–1.030)
pH: 5 (ref 5.0–8.0)

## 2018-07-13 LAB — BASIC METABOLIC PANEL
Anion gap: 10 (ref 5–15)
BUN: 12 mg/dL (ref 8–23)
CO2: 25 mmol/L (ref 22–32)
CREATININE: 1.03 mg/dL (ref 0.61–1.24)
Calcium: 9.1 mg/dL (ref 8.9–10.3)
Chloride: 103 mmol/L (ref 98–111)
GFR calc non Af Amer: >60 mL/min (ref >60)
Glucose, Bld: 281 mg/dL — ABNORMAL HIGH (ref 70–99)
Potassium: 4.2 mmol/L (ref 3.5–5.1)
SODIUM: 138 mmol/L (ref 135–145)

## 2018-07-13 LAB — CBC
HCT: 46.9 % (ref 39.0–52.0)
Hemoglobin: 16 g/dL (ref 13.0–17.0)
MCH: 30 pg (ref 26.0–34.0)
MCHC: 34.1 g/dL (ref 30.0–36.0)
MCV: 88 fL (ref 80.0–100.0)
Platelets: 225 10*3/uL (ref 150–400)
RBC: 5.33 MIL/uL (ref 4.22–5.81)
RDW: 12.5 % (ref 11.5–15.5)
WBC: 7.5 10*3/uL (ref 4.0–10.5)
nRBC: 0 % (ref 0.0–0.2)

## 2018-07-13 LAB — CBG MONITORING, ED: GLUCOSE-CAPILLARY: 279 mg/dL — AB (ref 70–99)

## 2018-07-13 LAB — GLUCOSE, CAPILLARY
Glucose-Capillary: 177 mg/dL — ABNORMAL HIGH (ref 70–99)
Glucose-Capillary: 233 mg/dL — ABNORMAL HIGH (ref 70–99)

## 2018-07-13 LAB — APTT: aPTT: 26 seconds (ref 24–36)

## 2018-07-13 LAB — PROTIME-INR
INR: 1.12
PROTHROMBIN TIME: 14.3 s (ref 11.4–15.2)

## 2018-07-13 MED ORDER — INSULIN ASPART 100 UNIT/ML ~~LOC~~ SOLN
0.0000 [IU] | Freq: Every day | SUBCUTANEOUS | Status: DC
  Administered 2018-07-13: 2 [IU] via SUBCUTANEOUS

## 2018-07-13 MED ORDER — INSULIN ASPART PROT & ASPART (70-30 MIX) 100 UNIT/ML ~~LOC~~ SUSP
5.0000 [IU] | Freq: Every day | SUBCUTANEOUS | Status: DC
  Administered 2018-07-14 – 2018-07-16 (×3): 5 [IU] via SUBCUTANEOUS

## 2018-07-13 MED ORDER — PANTOPRAZOLE SODIUM 40 MG PO TBEC
40.0000 mg | DELAYED_RELEASE_TABLET | Freq: Every day | ORAL | Status: DC
  Administered 2018-07-13 – 2018-07-16 (×4): 40 mg via ORAL
  Filled 2018-07-13 (×4): qty 1

## 2018-07-13 MED ORDER — ASPIRIN EC 81 MG PO TBEC
81.0000 mg | DELAYED_RELEASE_TABLET | Freq: Every day | ORAL | Status: DC
  Administered 2018-07-13 – 2018-07-14 (×2): 81 mg via ORAL
  Filled 2018-07-13 (×2): qty 1

## 2018-07-13 MED ORDER — INSULIN ASPART 100 UNIT/ML ~~LOC~~ SOLN
0.0000 [IU] | Freq: Three times a day (TID) | SUBCUTANEOUS | Status: DC
  Administered 2018-07-13 – 2018-07-14 (×3): 3 [IU] via SUBCUTANEOUS
  Administered 2018-07-14: 5 [IU] via SUBCUTANEOUS
  Administered 2018-07-15: 2 [IU] via SUBCUTANEOUS
  Administered 2018-07-15: 5 [IU] via SUBCUTANEOUS
  Administered 2018-07-15: 3 [IU] via SUBCUTANEOUS
  Administered 2018-07-16: 2 [IU] via SUBCUTANEOUS
  Administered 2018-07-16: 3 [IU] via SUBCUTANEOUS

## 2018-07-13 MED ORDER — ACETAMINOPHEN 650 MG RE SUPP: 650.0000 mg | RECTAL | Status: DC | PRN

## 2018-07-13 MED ORDER — SENNOSIDES-DOCUSATE SODIUM 8.6-50 MG PO TABS: 1.0000 | ORAL_TABLET | Freq: Every evening | ORAL | Status: DC | PRN

## 2018-07-13 MED ORDER — STROKE: EARLY STAGES OF RECOVERY BOOK
Freq: Once | Status: AC
  Administered 2018-07-13: 17:00:00
  Filled 2018-07-13: qty 1

## 2018-07-13 MED ORDER — ACETAMINOPHEN 325 MG PO TABS: 650.0000 mg | ORAL_TABLET | ORAL | Status: DC | PRN

## 2018-07-13 MED ORDER — ACETAMINOPHEN 160 MG/5ML PO SOLN: 650.0000 mg | ORAL | Status: DC | PRN

## 2018-07-13 MED ORDER — GABAPENTIN 600 MG PO TABS
600.0000 mg | ORAL_TABLET | Freq: Every day | ORAL | Status: DC
  Administered 2018-07-13 – 2018-07-15 (×3): 600 mg via ORAL
  Filled 2018-07-13 (×3): qty 1

## 2018-07-13 MED ORDER — SODIUM CHLORIDE 0.9 % IV SOLN
INTRAVENOUS | Status: DC
  Administered 2018-07-13: 18:00:00 via INTRAVENOUS

## 2018-07-13 MED ORDER — INSULIN ISOPHANE & REGULAR (HUMAN 70-30)100 UNIT/ML KWIKPEN: 5.0000 [IU] | PEN_INJECTOR | Freq: Three times a day (TID) | SUBCUTANEOUS | Status: DC

## 2018-07-13 MED ORDER — INSULIN ASPART PROT & ASPART (70-30 MIX) 100 UNIT/ML ~~LOC~~ SUSP
25.0000 [IU] | Freq: Two times a day (BID) | SUBCUTANEOUS | Status: DC
  Administered 2018-07-13 – 2018-07-16 (×6): 25 [IU] via SUBCUTANEOUS
  Filled 2018-07-13: qty 10

## 2018-07-13 MED ORDER — HEPARIN (PORCINE) 25000 UT/250ML-% IV SOLN
1200.0000 [IU]/h | INTRAVENOUS | Status: DC
  Administered 2018-07-13 – 2018-07-14 (×2): 1300 [IU]/h via INTRAVENOUS
  Filled 2018-07-13 (×2): qty 250

## 2018-07-13 NOTE — H&P (Signed)
History and Physical    Andre Wolfe ZOX:[id number] DOB: 04/22/1935 DOA: 07/13/2018  PCP: [name], MD Consultants:  Martinique - cardiology; Debbora Presto - endocrinology Patient coming from:  Home - lives with wife; NOK: Wife and daughter, [phone]  Chief Complaint: Left leg Weakness  HPI: Andre Wolfe is a 83 y.o. male with medical history significant of HTN; HLD; CAD s/p CABG; prostate CA; post-op afib; and DM presenting with left leg weakness.  On Christmas Day, his family noticed a change with walking, talking, weakness, increased falls.  He was using a walker recently but lately he can't walk at the grocery or [employer].  Now he just tires too easily.  Left facial droop.  +slurred speech.  No dysphagia - although his wife reported some coughing with eating; he does not think that is an issue.  He has not noticed weakness of one arm or leg - he was dragging his left leg last night and he couldn't tell.   ED Course:   Left-sided weakness since Christmas Day.  Appears to have a completed R pontine stroke.  MRI pending.  Neurology consulted.  Review of Systems: As per HPI; otherwise review of systems reviewed and negative.   Ambulatory Status:  Ambulates with a cane; has used a walker since Christmas Day; currently too unsteady to even use a walker  Past Medical History:  Diagnosis Date  . Arrhythmia    post op Afib.  . Cancer [facility])    prostate  . Coronary artery disease   . Diabetes mellitus without complication ([place])   . GERD (gastroesophageal reflux disease)   . Hyperlipidemia   . Hypertension   . Peripheral neuropathy     Past Surgical History:  Procedure Laterality Date  . CATARACT EXTRACTION    . CORONARY ARTERY BYPASS GRAFT  2017  . TONSILLECTOMY      Social History   Socioeconomic History  . Marital status: Married    Spouse name: Not on file  . Number of children: 2  . Years of education: Not on file  . Highest education level: Not on file  Occupational  History  . Occupation: farmer  Social Needs  . Financial resource strain: Not on file  . Food insecurity:    Worry: Not on file    Inability: Not on file  . Transportation needs:    Medical: Not on file    Non-medical: Not on file  Tobacco Use  . Smoking status: Current Every Day Smoker    Packs/day: 0.25    Years: 40.00    Pack years: 10.00  . Smokeless tobacco: Never Used  Substance and Sexual Activity  . Alcohol use: Not Currently  . Drug use: Never  . Sexual activity: Not on file  Lifestyle  . Physical activity:    Days per week: Not on file    Minutes per session: Not on file  . Stress: Not on file  Relationships  . Social connections:    Talks on phone: Not on file    Gets together: Not on file    Attends religious service: Not on file    Active member of club or organization: Not on file    Attends meetings of clubs or organizations: Not on file    Relationship status: Not on file  . Intimate partner violence:    Fear of current or ex partner: Not on file    Emotionally abused: Not on file    Physically abused: Not  on file    Forced sexual activity: Not on file  Other Topics Concern  . Not on file  Social History Narrative  . Not on file    No Known Allergies  Family History  Problem Relation Age of Onset  . Heart disease Mother   . Breast cancer Mother   . Leukemia Father   . Diabetes Brother   . Breast cancer Sister   . Stroke Neg Hx     Prior to Admission medications   Medication Sig Start Date End Date Taking? Authorizing Provider  amLODipine (NORVASC) 2.5 MG tablet Take 1 tablet (2.5 mg total) by mouth daily. 05/27/18 08/25/18 Yes Martinique, Peter M, MD  aspirin EC 81 MG tablet Take 81 mg by mouth daily.   Yes [provider]  [facility] OMEPRAZOLE PO Take 20 mg by mouth 4 (four) times daily.   Yes [provider]  gabapentin (NEURONTIN) 600 MG tablet Take 600 mg by mouth at bedtime.  11/20/17  Yes [provider]  ibuprofen  (ADVIL,MOTRIN) 200 MG tablet Take 600 mg by mouth every 6 (six) hours as needed for moderate pain.   Yes [provider]  Insulin Isophane & Regular Human (NOVOLIN 70/30 FLEXPEN) (70-30) 100 UNIT/ML PEN Inject 5-25 Units into the skin 3 (three) times daily. Taking 25 units in the Am and 5 units Midday, and 25 units before dinner.   Yes [provider]  losartan (COZAAR) 100 MG tablet Take 100 mg by mouth daily.   Yes [provider]  metoprolol tartrate (LOPRESSOR) 25 MG tablet Take 25 mg by mouth 2 (two) times daily.  11/07/17  Yes [provider]  Evolocumab (REPATHA SURECLICK) 992 MG/ML SOAJ Inject 140 mg into the skin every 14 (fourteen) days. 06/26/18   Martinique, Peter M, MD  ezetimibe (ZETIA) 10 MG tablet Take 1 tablet (10 mg total) by mouth daily. Patient not taking: Reported on 07/13/2018 11/27/17 11/22/18  Martinique, Peter M, MD    Physical Exam: Vitals:   07/13/18 1330 07/13/18 1345 07/13/18 1445 07/13/18 1525  BP: (!) 175/74 (!) 187/79 (!) 209/73 (!) 188/72  Pulse:    (!) 54  Resp: 16 15 16 18   Temp:    97.9 F (36.6 C)  TempSrc:    Oral  SpO2:    97%  Weight:      Height:         General:  Appears calm and comfortable and is NAD; he did become emotionally labile when discussing his CVA and code status Eyes:  PERRL, EOMI, normal lids, iris ENT:  grossly normal hearing, lips & tongue, mmm Neck:  no LAD, masses or thyromegaly; no carotid bruits Cardiovascular:  RRR, no m/r/g. No LE edema.  Respiratory:   CTA bilaterally with no wheezes/rales/rhonchi.  Normal respiratory effort. Abdomen:  soft, NT, ND, NABS Skin:  no rash or induration seen on limited exam Musculoskeletal:  4-5/5 LUE strength, 4/5 LLE strength, good ROM, no bony abnormality Lower extremity:  No LE edema.  Limited foot exam with no ulcerations.  2+ distal pulses. Psychiatric:  Blunted mood and affect with mild emotional lability as above, speech fluent and appropriate,  AOx3 Neurologic:  CN 2-12 grossly intact other than mild left facial droop, moves all extremities in coordinated fashion, sensation intact    Radiological Exams on Admission: Ct Head Wo Contrast  Result Date: 07/13/2018 CLINICAL DATA:  Left leg weakness, left facial droop and slurred speech since 07/08/2018. EXAM: CT HEAD WITHOUT  CONTRAST TECHNIQUE: Contiguous axial images were obtained from the base of the skull through the vertex without intravenous contrast. COMPARISON:  None. FINDINGS: Brain: The patient has an age indeterminate infarct in the right pons. Left occipital lobe infarct appears more remote but can not be definitively characterized. No mass, hemorrhage, midline shift or abnormal extra-axial fluid collection is identified. No hydrocephalus or pneumocephalus. Vascular: Atherosclerosis is noted. Skull: Intact.  No focal lesion. Sinuses/Orbits: Small mucous retention cyst or polyp left maxillary sinus noted. There is minimal mucosal thickening in the right maxillary sinus. Other: None. IMPRESSION: Right pontine infarct is age indeterminate. Left occipital lobe infarct appears more remote but also can not be definitively characterized. Atrophy and chronic microvascular ischemic change. Atherosclerosis. Electronically Signed   By: Inge Rise M.D.   On: 07/13/2018 11:27   Mr Jodene Nam Head Wo Contrast  Result Date: 07/13/2018 CLINICAL DATA:  83 y/o M; left leg weakness, slurred speech, left facial droop 07/08/2018. EXAM: MRI HEAD WITHOUT CONTRAST MRA HEAD WITHOUT CONTRAST TECHNIQUE: Multiplanar, multiecho pulse sequences of the brain and surrounding structures were obtained without intravenous contrast. Angiographic images of the head were obtained using MRA technique without contrast. COMPARISON:  07/13/2018 CT head. FINDINGS: MRI HEAD FINDINGS Brain: Right paramedian pontine focus of reduced diffusion measuring 15 x 10 mm (AP by ML series 5, image 64) compatible with acute/early subacute  infarction. Additional punctate focus of infarction within the left parietal cortex. No associated hemorrhage or mass effect. Several nonspecific T2 FLAIR hyperintensities in subcortical and periventricular white matter are compatible with mild chronic microvascular ischemic changes for age. Mild volume loss of the brain. No extra-axial collection, hydrocephalus, mass effect, or herniation. No abnormal susceptibility hypointensity to indicate intracranial hemorrhage. Vascular: Normal flow voids. Skull and upper cervical spine: Normal marrow signal. Sinuses/Orbits: Mild left maxillary sinus mucosal thickening. No additional abnormal signal of the visible paranasal sinuses and mastoid air cells. Bilateral intra-ocular lens replacement. Other: None. MRA HEAD FINDINGS Internal carotid arteries: Patent. Lumen irregularity of the internal carotid arteries compatible with atherosclerosis. Mild proximal left cavernous stenosis and mild-to-moderate right paraclinoid ICA stenosis. Anterior cerebral arteries: Patent. Large left A1 and anterior communicating artery, normal variant. Middle cerebral arteries: Patent. Mild non stenotic lumen irregularity of M1 segments compatible with atherosclerosis. Right M2 inferior division origin mild stenosis. Anterior communicating artery: Patent. Posterior communicating arteries:  Patent. Posterior cerebral arteries: Patent. Moderate left P1 stenosis. Mild distal right P2 stenosis. Basilar artery:  Patent.  Mild mid basilar stenosis. Vertebral arteries:  Patent. No aneurysm or large vessel occlusion. IMPRESSION: MRI head: 1. Right paramedian pons acute/early subacute infarction. Additional punctate infarction in left parietal cortex. No hemorrhage or mass effect. 2. Mild for age chronic microvascular ischemic changes and volume loss of the brain. MRA head: 1. No large vessel occlusion or aneurysm identified. 2. Intracranial atherosclerosis with multiple segments of mild-to-moderate  stenosis in the anterior and posterior circulation. Electronically Signed   By: Kristine Garbe M.D.   On: 07/13/2018 13:45   Mr Brain Wo Contrast  Result Date: 07/13/2018 CLINICAL DATA:  82 y/o M; left leg weakness, slurred speech, left facial droop 07/08/2018. EXAM: MRI HEAD WITHOUT CONTRAST MRA HEAD WITHOUT CONTRAST TECHNIQUE: Multiplanar, multiecho pulse sequences of the brain and surrounding structures were obtained without intravenous contrast. Angiographic images of the head were obtained using MRA technique without contrast. COMPARISON:  07/13/2018 CT head. FINDINGS: MRI HEAD FINDINGS Brain: Right paramedian pontine focus of reduced diffusion measuring 15 x 10 mm (AP by  ML series 5, image 64) compatible with acute/early subacute infarction. Additional punctate focus of infarction within the left parietal cortex. No associated hemorrhage or mass effect. Several nonspecific T2 FLAIR hyperintensities in subcortical and periventricular white matter are compatible with mild chronic microvascular ischemic changes for age. Mild volume loss of the brain. No extra-axial collection, hydrocephalus, mass effect, or herniation. No abnormal susceptibility hypointensity to indicate intracranial hemorrhage. Vascular: Normal flow voids. Skull and upper cervical spine: Normal marrow signal. Sinuses/Orbits: Mild left maxillary sinus mucosal thickening. No additional abnormal signal of the visible paranasal sinuses and mastoid air cells. Bilateral intra-ocular lens replacement. Other: None. MRA HEAD FINDINGS Internal carotid arteries: Patent. Lumen irregularity of the internal carotid arteries compatible with atherosclerosis. Mild proximal left cavernous stenosis and mild-to-moderate right paraclinoid ICA stenosis. Anterior cerebral arteries: Patent. Large left A1 and anterior communicating artery, normal variant. Middle cerebral arteries: Patent. Mild non stenotic lumen irregularity of M1 segments compatible with  atherosclerosis. Right M2 inferior division origin mild stenosis. Anterior communicating artery: Patent. Posterior communicating arteries:  Patent. Posterior cerebral arteries: Patent. Moderate left P1 stenosis. Mild distal right P2 stenosis. Basilar artery:  Patent.  Mild mid basilar stenosis. Vertebral arteries:  Patent. No aneurysm or large vessel occlusion. IMPRESSION: MRI head: 1. Right paramedian pons acute/early subacute infarction. Additional punctate infarction in left parietal cortex. No hemorrhage or mass effect. 2. Mild for age chronic microvascular ischemic changes and volume loss of the brain. MRA head: 1. No large vessel occlusion or aneurysm identified. 2. Intracranial atherosclerosis with multiple segments of mild-to-moderate stenosis in the anterior and posterior circulation. Electronically Signed   By: Kristine Garbe M.D.   On: 07/13/2018 13:45    EKG: Independently reviewed.  NSR with rate 57; prolonged QT 536; nonspecific ST changes with no evidence of acute ischemia   Labs on Admission: I have personally reviewed the available labs and imaging studies at the time of the admission.  Pertinent labs:   Glucose 281 Normal CBC INR 1.12 UA: >500 glucose, 30 protein  Assessment/Plan Active Problems:   Right pontine CVA (HCC)    CVA -Patient presenting with left leg weakness and left facial droop starting on 12/25 - concerning for completed CVA -CT/MRI confirms acute/subacute pontine CVA as well as left parietal cortex infarct -Will admit for further CVA evaluation -Telemetry monitoring -Carotid dopplers -Echo -Risk stratification with FLP, A1c; will also check TSH and UDS -ASA daily -Neurology consult -PT/OT/ST/Nutrition Consults -Rehab consult for CIR placement  Afib -Patient was seen by Dr. Martinique in 9/19 and taken off Eliquis -At the time, the patient had only had 1 episode of short-term afib which was post-operative in nature and so d/c of AC was  reasonable -However, he appears to have PAF given his multiple infarcts and would likely benefit from [facility] -For now, will start heparin per pharmacy -Cardiology consult requested for tomorrow  HTN -Allow permissive HTN for now -Treat BP only if >220/120, and then with goal of 15% reduction -Hold ARB, CCB, BB and plan to restart in 48-72 hours   HLD -He has had difficulty with statins in the past (myalgias) -I do not see a record of lipid testing in Epic including Care Everywhere; however, his last note from Dr. Martinique reports LDL decreased from 141 to 112 on Zetia but his goal is <70 and so he was referred to PharmD for PCSK 9 inhibitor (Repatha); he has not yet started this medication -Pravastatin would be another consideration since its hydrophilic properties may make  it better tolerated -Will defer to neurology/cardiology   DM -Check A1c -Continue 70/30 -Will order moderate-scale SSI     DVT prophylaxis:  Heparin Code Status: DNR - confirmed with patient/family Family Communication: Daughter present throughout evaluation Disposition Plan:  To be determined - would benefit from CIR if possible Consults called: Neurology; Cardiology; PM&R; PT/OT/ST/Nutrition  Admission status: Admit - It is my clinical opinion that admission to INPATIENT is reasonable and necessary because of the expectation that this patient will require hospital care that crosses at least 2 midnights to treat this condition based on the medical complexity of the problems presented.  Given the aforementioned information, the predictability of an adverse outcome is felt to be significant.    Karmen Bongo MD [facility]  If note is complete, please contact covering daytime or nighttime physician. [email or web address] Password [facility]  07/13/2018, 4:43 PM

## 2018-07-13 NOTE — Plan of Care (Signed)
Pt is progressing toward goal 

## 2018-07-13 NOTE — ED Triage Notes (Signed)
Patient arrived from home via [facility] reporting left leg weakness, slurred speech, left face droop since 07/08/18. Patient reports that he feels unsteady. Walker/cane at baseline.

## 2018-07-13 NOTE — ED Provider Notes (Signed)
[facility] Emergency Department Provider Note MRN:  [id number]  Arrival date & time: 07/13/18     Chief Complaint   Weakness History of Present Illness   Andre Wolfe is a 83 y.o. year-old male with a history of CAD status post CABG presenting to the ED with chief complaint of weakness.  Christmas morning, 5 days ago, patient was feeling his normal self, walking without issue.  At some point in the afternoon he began experiencing weakness to the point where he was unable to walk without the assistance of a walker.  The weakness seems to be more on the left side affecting the left leg.  His walking is only gotten worse over the past 5 days.  Family also noticing a subtle left facial droop.  Patient denies headache or vision change, no chest pain or shortness of breath, no abdominal pain.  Symptoms are constant, no exacerbating relieving factors.  No head trauma.  Review of Systems  A complete 10 system review of systems was obtained and all systems are negative except as noted in the HPI and PMH.   Patient's Health History    Past Medical History:  Diagnosis Date  . Arrhythmia    post op Afib.  . Cancer [facility])    prostate  . Coronary artery disease   . Diabetes mellitus without complication ([place])   . GERD (gastroesophageal reflux disease)   . Hyperlipidemia   . Hypertension   . Peripheral neuropathy     Past Surgical History:  Procedure Laterality Date  . CATARACT EXTRACTION    . CORONARY ARTERY BYPASS GRAFT  2017  . TONSILLECTOMY      Family History  Problem Relation Age of Onset  . Heart disease Mother   . Breast cancer Mother   . Leukemia Father   . Diabetes Brother   . Breast cancer Sister   . Stroke Neg Hx     Social History   Socioeconomic History  . Marital status: Married    Spouse name: Not on file  . Number of children: 2  . Years of education: Not on file  . Highest education level: Not on file  Occupational History  . Occupation:  farmer  Social Needs  . Financial resource strain: Not on file  . Food insecurity:    Worry: Not on file    Inability: Not on file  . Transportation needs:    Medical: Not on file    Non-medical: Not on file  Tobacco Use  . Smoking status: Current Every Day Smoker    Packs/day: 0.25    Years: 40.00    Pack years: 10.00  . Smokeless tobacco: Never Used  Substance and Sexual Activity  . Alcohol use: Not Currently  . Drug use: Never  . Sexual activity: Not on file  Lifestyle  . Physical activity:    Days per week: Not on file    Minutes per session: Not on file  . Stress: Not on file  Relationships  . Social connections:    Talks on phone: Not on file    Gets together: Not on file    Attends religious service: Not on file    Active member of club or organization: Not on file    Attends meetings of clubs or organizations: Not on file    Relationship status: Not on file  . Intimate partner violence:    Fear of current or ex partner: Not on file    Emotionally  abused: Not on file    Physically abused: Not on file    Forced sexual activity: Not on file  Other Topics Concern  . Not on file  Social History Narrative  . Not on file     Physical Exam  Vital Signs and Nursing Notes reviewed Vitals:   07/13/18 1445 07/13/18 1525  BP: (!) 209/73 (!) 188/72  Pulse:  (!) 54  Resp: 16 18  Temp:  97.9 F (36.6 C)  SpO2:      CONSTITUTIONAL: Well-appearing, NAD NEURO:  Alert and oriented x 3, no visual field cuts, slight dysarthria, no aphasia, no neglect, subtle left facial droop, subtle left pronator drift EYES:  eyes equal and reactive ENT/NECK:  no LAD, no JVD CARDIO: Regular rate, well-perfused, normal S1 and S2 PULM:  CTAB no wheezing or rhonchi GI/GU:  normal bowel sounds, non-distended, non-tender MSK/SPINE:  No gross deformities, no edema SKIN:  no rash, atraumatic PSYCH:  Appropriate speech and behavior  Diagnostic and Interventional Summary    EKG  Interpretation  Date/Time:  Monday July 13 2018 11:00:35 EST Ventricular Rate:  57 PR Interval:    QRS Duration: 97 QT Interval:  550 QTC Calculation: 536 R Axis:   -29 Text Interpretation:  Sinus rhythm Probable left atrial enlargement Borderline left axis deviation Anterior infarct, age indeterminate Prolonged QT interval Confirmed by Gerlene Fee [phone]) on 07/13/2018 11:50:50 AM      Labs Reviewed  BASIC METABOLIC PANEL - Abnormal; Notable for the following components:      Result Value   Glucose, Bld 281 (*)    All other components within normal limits  URINALYSIS, ROUTINE W REFLEX MICROSCOPIC - Abnormal; Notable for the following components:   Glucose, UA >=500 (*)    Protein, ur 30 (*)    All other components within normal limits  CBG MONITORING, ED - Abnormal; Notable for the following components:   Glucose-Capillary 279 (*)    All other components within normal limits  CBC  PROTIME-INR  APTT  HEPARIN LEVEL (UNFRACTIONATED)  CBC    MR BRAIN WO CONTRAST  Final Result    MR MRA HEAD WO CONTRAST  Final Result    CT HEAD WO CONTRAST  Final Result    VAS US CAROTID (at [facility] and [facility] only)    (Results Pending)    Medications  aspirin EC tablet 81 mg (has no administration in time range)  gabapentin (NEURONTIN) tablet 600 mg (has no administration in time range)  pantoprazole (PROTONIX) EC tablet 40 mg (has no administration in time range)   stroke: mapping our early stages of recovery book (has no administration in time range)  0.9 %  sodium chloride infusion (has no administration in time range)  acetaminophen (TYLENOL) tablet 650 mg (has no administration in time range)    Or  acetaminophen (TYLENOL) solution 650 mg (has no administration in time range)    Or  acetaminophen (TYLENOL) suppository 650 mg (has no administration in time range)  senna-docusate (Senokot-S) tablet 1 tablet (has no administration in time range)  insulin aspart (novoLOG) injection  0-15 Units (has no administration in time range)  insulin aspart (novoLOG) injection 0-5 Units (has no administration in time range)  insulin aspart protamine- aspart (NOVOLOG MIX 70/30) injection 25 Units (has no administration in time range)  insulin aspart protamine- aspart (NOVOLOG MIX 70/30) injection 5 Units (has no administration in time range)  heparin ADULT infusion 100 units/mL (25000 units/24mL sodium chloride 0.45%) (has no  administration in time range)     Procedures Critical Care Critical Care Documentation Critical care time provided by me (excluding procedures): 33 minutes  Condition necessitating critical care: Acute ischemic stroke  Components of critical care management: reviewing of prior records, laboratory and imaging interpretation, frequent re-examination and reassessment of vital signs, discussion with consulting services.    ED Course and Medical Decision Making  I have reviewed the triage vital signs and the nursing notes.  Pertinent labs & imaging results that were available during my care of the patient were reviewed by me and considered in my medical decision making (see below for details).  Concern for completed stroke in this 83 year old male with history of CAD.  Unable to ambulate without significant assistance, lives alone with elderly wife, will likely need admission.  Will start with CT noncontrast, likely move onto MRI.  MRI pending, neurology made aware, admitted to hospitalist service for further care and evaluation.  Andre Kirks. Andre Wolfe, [place] [email or web address]  Final Clinical Impressions(s) / ED Diagnoses     ICD-10-CM   1. Acute ischemic stroke [facility]) I63.9     ED Discharge Orders    None         Maudie Flakes, MD 07/13/18 1615

## 2018-07-13 NOTE — ED Notes (Signed)
Admitting MD at bedside.

## 2018-07-13 NOTE — Consult Note (Addendum)
Neurology Consultation  Reason for Consult: Stroke Referring Physician: Dr. Lorin Mercy  CC: Left-sided weakness  History is obtained from: Daughter and patient  HPI: Andre Wolfe is a 83 y.o. male with history of peripheral neuropathy, hypertension, hyperlipidemia, diabetes, tobacco abuse, and now atrial fibrillation.  Patient is on aspirin as his prior atrial fibrillation was only secondary to postoperative issue however now it looks as though he is chronically in atrial fibrillation.  Patient and daughter do admit that he does not take care of his glucose, smokes approximately 3 to 4 cigarettes a day of however this is much better than prior.  He has been on statins before for his cholesterol however apparently they do cause myalgias.  As of December 24 he was deconditioned but on Christmas day they noticed a significant difference in his strength in his legs.  He feels as though bilateral legs are seriously weak however family noted that his left was worse than his right.  Thus they brought him in for evaluation of possible stroke.  Daughter admits that the patient is very deconditioned.  On a daily basis patient will sit watch TV, go to the bathroom and then back to the couch.  He also is out of breath even just getting up from the couch and going to the bathroom.  They have tried to get him into Silver sneakers or any type of activity however patient seems to be resistant.   ED course : CT of head was obtained and showed a right pontine infarct which was age-indeterminate.  It also showed left occipital lobe infarcts that appear to be remote.  Glucose was also obtained which was greater than 500  LKW: 07/07/2018 tpa given?: no, out of window Premorbid modified Rankin scale (mRS): 2 NIH stroke scale of 2 ROS: A 14 point ROS was performed and is negative except as noted in the HPI.   Past Medical History:  Diagnosis Date  . Arrhythmia    post op Afib.  . Cancer [facility])    prostate  . Coronary  artery disease   . Diabetes mellitus without complication ([place])   . GERD (gastroesophageal reflux disease)   . Hyperlipidemia   . Hypertension   . Peripheral neuropathy    Family History  Problem Relation Age of Onset  . Heart disease Mother   . Breast cancer Mother   . Leukemia Father   . Diabetes Brother   . Breast cancer Sister    Social History:   reports that he has been smoking. He has a 10.00 pack-year smoking history. He has never used smokeless tobacco. He reports previous alcohol use. No history on file for drug.  Medications No current facility-administered medications for this encounter.   Current Outpatient Medications:  .  amLODipine (NORVASC) 2.5 MG tablet, Take 1 tablet (2.5 mg total) by mouth daily., Disp: 90 tablet, Rfl: 3 .  aspirin EC 81 MG tablet, Take 81 mg by mouth daily., Disp: , Rfl:  .  [employer] OMEPRAZOLE PO, Take 20 mg by mouth 4 (four) times daily., Disp: , Rfl:  .  gabapentin (NEURONTIN) 600 MG tablet, Take 600 mg by mouth at bedtime. , Disp: , Rfl: 1 .  ibuprofen (ADVIL,MOTRIN) 200 MG tablet, Take 600 mg by mouth every 6 (six) hours as needed for moderate pain., Disp: , Rfl:  .  Insulin Isophane & Regular Human (NOVOLIN 70/30 FLEXPEN) (70-30) 100 UNIT/ML PEN, Inject 5-25 Units into the skin 3 (three) times daily. Taking 25 units  in the Am and 5 units Midday, and 25 units before dinner., Disp: , Rfl:  .  losartan (COZAAR) 100 MG tablet, Take 100 mg by mouth daily., Disp: , Rfl:  .  metoprolol tartrate (LOPRESSOR) 25 MG tablet, Take 25 mg by mouth 2 (two) times daily. , Disp: , Rfl: 3 .  Evolocumab (REPATHA SURECLICK) 034 MG/ML SOAJ, Inject 140 mg into the skin every 14 (fourteen) days., Disp: 2 pen, Rfl: 11 .  ezetimibe (ZETIA) 10 MG tablet, Take 1 tablet (10 mg total) by mouth daily. (Patient not taking: Reported on 07/13/2018), Disp: 90 tablet, Rfl: 3  Exam: Current vital signs: BP (!) 165/69   Pulse (!) 54   Temp 98.5 F (36.9 C) (Oral)   Resp 19    Ht 5\' 9"  (1.753 m)   Wt 96.2 kg   SpO2 97%   BMI 31.31 kg/m  Vital signs in last 24 hours: Temp:  [98.5 F (36.9 C)] 98.5 F (36.9 C) (12/30 0937) Pulse Rate:  [51-59] 54 (12/30 1145) Resp:  [15-24] 19 (12/30 1145) BP: (127-165)/(59-115) 165/69 (12/30 1145) SpO2:  [96 %-100 %] 97 % (12/30 1145) Weight:  [96.2 kg] 96.2 kg (12/30 0932)  Physical Exam  Constitutional: Overweight and well-nourished.  Psych: Affect appropriate to situation Eyes: No scleral injection HENT: No OP obstrucion Head: Normocephalic.  Cardiovascular: Normal rate and regular rhythm.  Respiratory: Effort normal, non-labored breathing GI: Soft.  No distension. There is no tenderness.  Skin: WDI  Neuro: Mental Status: Patient is awake, alert, oriented to person, place, month, year, and situation. Patient is able to give a clear and coherent history. No signs of aphasia but does have dysarthria Cranial Nerves: II: Visual Fields are full. Pupils are equal, round, and reactive to light.   III,IV, VI: EOMI without ptosis or diploplia.  V: Facial sensation is symmetric to temperature VII: Left facial droop and smiles as I can see more teeth on the right than the left.  VIII: hearing is intact to voice X: Uvula elevates symmetrically XI: Shoulder shrug is symmetric. XII: tongue is midline without atrophy or fasciculations.  Motor: Right upper extremity shows 5/5, left upper extremity shows 4/5 with tricep extension, right leg is 5 out of 5, left leg is 4/5 with slight drift Sensory: Sensation is symmetric to light touch and temperature in the arms and legs. Deep Tendon Reflexes: 2+ and symmetric upper extremities with 1+ at knees and I did not notice a significant Achilles reflex however patient was not relaxing completely as he became depressed about the issue Plantars: Toes are downgoing bilaterally. Cerebellar: Finger finger showed no dysmetria, left heel-to-shin did show dysmetria and no dysmetria on  right heel shin-believe this is because of strength  Labs I have reviewed labs in epic and the results pertinent to this consultation are:   CBC    Component Value Date/Time   WBC 7.5 07/13/2018 1000   RBC 5.33 07/13/2018 1000   HGB 16.0 07/13/2018 1000   HCT 46.9 07/13/2018 1000   PLT 225 07/13/2018 1000   MCV 88.0 07/13/2018 1000   MCH 30.0 07/13/2018 1000   MCHC 34.1 07/13/2018 1000   RDW 12.5 07/13/2018 1000    CMP     Component Value Date/Time   NA 138 07/13/2018 1000   K 4.2 07/13/2018 1000   CL 103 07/13/2018 1000   CO2 25 07/13/2018 1000   GLUCOSE 281 (H) 07/13/2018 1000   BUN 12 07/13/2018 1000   CREATININE 1.03  07/13/2018 1000   CALCIUM 9.1 07/13/2018 1000   GFRNONAA >60 07/13/2018 1000   GFRAA >60 07/13/2018 1000    Lipid Panel  No results found for: CHOL, TRIG, HDL, CHOLHDL, VLDL, LDLCALC, LDLDIRECT   Imaging I have reviewed the images obtained:  CT-scan of the brain- right pontine infarct which is age-indeterminate.  Left occipital lobe infarct appears more remote but also cannot be definitively characterized.  MRI examination of the brain--right paramedian pontine acute/early subacute infarction.  Additional punctate infarct in left parietal cortex.  No hemorrhage or mass affect.  Patient does have mild for age chronic microvascular ischemia changes and volume loss.  MRA head shows-no large vessel occlusion or aneurysm.  Intracranial atherosclerosis with multiple segments of mild to moderate stenosis in the anterior and posterior circulation  Etta Quill PA-C [facility] [phone]  M-F  (9:00 am- 5:00 PM)  07/13/2018, 2:23 PM  I have seen the patient and reviewed the above note   Assessment:  This is a 83 year old male with multiple stroke risk including new onset A. fib with pontine infarct as well as small cortical infarct.  I suspect cardioembolism given the 2 different locations  Recommendations:Recommend #Transthoracic  Echo,   #Continue  patient on ASA 325mg  daily, though he will likely need long-term anticoagulation #Start statin if patient can tolerate it.  Per daughter he does have a an injection of some form of anti-hyperlipidemic drug # BP goal: permissive HTN upto 220/120 mmHg # HBAIC and Lipid profile # Telemetry monitoring # Frequent neuro checks # NPO until passes stroke swallow screen # please page stroke NP  Or  PA  Or MD from 8am -4 pm  as this patient from this time will be  followed by the stroke.   You can look them up on [email or web address]  Password [email or web address]   Roland Rack, MD [facility] [phone]  If 7pm- 7am, please page neurology on call as listed in [place].

## 2018-07-13 NOTE — ED Notes (Signed)
Patient transported to MRI 

## 2018-07-13 NOTE — Progress Notes (Signed)
ANTICOAGULATION CONSULT NOTE - Initial Consult  Pharmacy Consult for heparin Indication: atrial fibrillation  No Known Allergies  Patient Measurements: Height: 5\' 9"  (175.3 cm) Weight: 212 lb (96.2 kg) IBW/kg (Calculated) : 70.7 Heparin Dosing Weight: 91 kg  Vital Signs: Temp: 98.5 F (36.9 C) (12/30 0937) Temp Source: Oral (12/30 0937) BP: 209/73 (12/30 1445) Pulse Rate: 54 (12/30 1145)  Labs: Recent Labs    07/13/18 1000  HGB 16.0  HCT 46.9  PLT 225  APTT 26  LABPROT 14.3  INR 1.12  CREATININE 1.03    Estimated Creatinine Clearance: 64.4 mL/min (by C-G formula based on SCr of 1.03 mg/dL).   Medical History: Past Medical History:  Diagnosis Date  . Arrhythmia    post op Afib.  . Cancer [facility])    prostate  . Coronary artery disease   . Diabetes mellitus without complication ([place])   . GERD (gastroesophageal reflux disease)   . Hyperlipidemia   . Hypertension   . Peripheral neuropathy     Medications:  Medications Prior to Admission  Medication Sig Dispense Refill Last Dose  . amLODipine (NORVASC) 2.5 MG tablet Take 1 tablet (2.5 mg total) by mouth daily. 90 tablet 3 07/13/2018 at 0730  . aspirin EC 81 MG tablet Take 81 mg by mouth daily.   07/12/2018  . [facility] OMEPRAZOLE PO Take 20 mg by mouth 4 (four) times daily.   07/13/2018 at Unknown time  . gabapentin (NEURONTIN) 600 MG tablet Take 600 mg by mouth at bedtime.   1 07/12/2018 at Unknown time  . ibuprofen (ADVIL,MOTRIN) 200 MG tablet Take 600 mg by mouth every 6 (six) hours as needed for moderate pain.   unk at prn  . Insulin Isophane & Regular Human (NOVOLIN 70/30 FLEXPEN) (70-30) 100 UNIT/ML PEN Inject 5-25 Units into the skin 3 (three) times daily. Taking 25 units in the Am and 5 units Midday, and 25 units before dinner.   07/13/2018 at Unknown time  . losartan (COZAAR) 100 MG tablet Take 100 mg by mouth daily.   07/12/2018 at Unknown time  . metoprolol tartrate (LOPRESSOR) 25 MG tablet Take 25 mg by  mouth 2 (two) times daily.   3 07/13/2018 at 0730  . Evolocumab (REPATHA SURECLICK) 323 MG/ML SOAJ Inject 140 mg into the skin every 14 (fourteen) days. 2 pen 11     Assessment: 83 y/o male who presented to the ED with left leg weakness, slurred speech and left facial droop found to have right pontine infarct. He also has new onset Afib. Pharmacy consulted to begin IV heparin.   Goal of Therapy:  Heparin level 0.3-0.5 units/ml Monitor platelets by anticoagulation protocol: Yes   Plan:  - Begin heparin drip at 1300 units/hr with no bolus - 8 hr heparin level - Daily heparin level and CBC - Monitor for s/sx of bleeding   Renold Genta, PharmD, BCPS Clinical Pharmacist Clinical phone for 07/13/2018 until 10p is [phone] 07/13/2018 3:16 PM  **Pharmacist phone directory can now be found on [email or web address] listed under [place]**

## 2018-07-14 ENCOUNTER — Inpatient Hospital Stay (HOSPITAL_COMMUNITY): Payer: Medicare Other

## 2018-07-14 ENCOUNTER — Encounter (HOSPITAL_COMMUNITY): Payer: Self-pay | Admitting: Physician Assistant

## 2018-07-14 DIAGNOSIS — I10 Essential (primary) hypertension: Secondary | ICD-10-CM

## 2018-07-14 DIAGNOSIS — Z720 Tobacco use: Secondary | ICD-10-CM

## 2018-07-14 DIAGNOSIS — I639 Cerebral infarction, unspecified: Secondary | ICD-10-CM

## 2018-07-14 DIAGNOSIS — E1165 Type 2 diabetes mellitus with hyperglycemia: Secondary | ICD-10-CM

## 2018-07-14 DIAGNOSIS — I361 Nonrheumatic tricuspid (valve) insufficiency: Secondary | ICD-10-CM

## 2018-07-14 DIAGNOSIS — I6350 Cerebral infarction due to unspecified occlusion or stenosis of unspecified cerebral artery: Secondary | ICD-10-CM

## 2018-07-14 DIAGNOSIS — I480 Paroxysmal atrial fibrillation: Secondary | ICD-10-CM

## 2018-07-14 DIAGNOSIS — E1142 Type 2 diabetes mellitus with diabetic polyneuropathy: Secondary | ICD-10-CM

## 2018-07-14 LAB — LIPID PANEL
CHOL/HDL RATIO: 7 ratio
Cholesterol: 211 mg/dL — ABNORMAL HIGH (ref 0–200)
HDL: 30 mg/dL — AB (ref >40)
LDL Cholesterol: 153 mg/dL — ABNORMAL HIGH (ref 0–99)
Triglycerides: 139 mg/dL (ref <150)
VLDL: 28 mg/dL (ref 0–40)

## 2018-07-14 LAB — COMPREHENSIVE METABOLIC PANEL
ALT: 23 U/L (ref 0–44)
ANION GAP: 14 (ref 5–15)
AST: 18 U/L (ref 15–41)
Albumin: 3.5 g/dL (ref 3.5–5.0)
Alkaline Phosphatase: 38 U/L (ref 38–126)
BUN: 15 mg/dL (ref 8–23)
CO2: 21 mmol/L — ABNORMAL LOW (ref 22–32)
Calcium: 8.9 mg/dL (ref 8.9–10.3)
Chloride: 103 mmol/L (ref 98–111)
Creatinine, Ser: 0.94 mg/dL (ref 0.61–1.24)
GFR calc Af Amer: >60 mL/min (ref >60)
GFR calc non Af Amer: >60 mL/min (ref >60)
Glucose, Bld: 158 mg/dL — ABNORMAL HIGH (ref 70–99)
Potassium: 3.5 mmol/L (ref 3.5–5.1)
Sodium: 138 mmol/L (ref 135–145)
Total Bilirubin: 0.7 mg/dL (ref 0.3–1.2)
Total Protein: 6.6 g/dL (ref 6.5–8.1)

## 2018-07-14 LAB — CBC
HCT: 43.4 % (ref 39.0–52.0)
Hemoglobin: 14.7 g/dL (ref 13.0–17.0)
MCH: 29.1 pg (ref 26.0–34.0)
MCHC: 33.9 g/dL (ref 30.0–36.0)
MCV: 85.9 fL (ref 80.0–100.0)
Platelets: 212 10*3/uL (ref 150–400)
RBC: 5.05 MIL/uL (ref 4.22–5.81)
RDW: 12.3 % (ref 11.5–15.5)
WBC: 6.7 10*3/uL (ref 4.0–10.5)
nRBC: 0 % (ref 0.0–0.2)

## 2018-07-14 LAB — TSH: TSH: 2.482 u[IU]/mL (ref 0.350–4.500)

## 2018-07-14 LAB — HEPARIN LEVEL (UNFRACTIONATED)
Heparin Unfractionated: 0.32 IU/mL (ref 0.30–0.70)
Heparin Unfractionated: 0.49 IU/mL (ref 0.30–0.70)

## 2018-07-14 LAB — GLUCOSE, CAPILLARY
GLUCOSE-CAPILLARY: 170 mg/dL — AB (ref 70–99)
Glucose-Capillary: 209 mg/dL — ABNORMAL HIGH (ref 70–99)
Glucose-Capillary: 194 mg/dL — ABNORMAL HIGH (ref 70–99)
Glucose-Capillary: 165 mg/dL — ABNORMAL HIGH (ref 70–99)

## 2018-07-14 LAB — HEMOGLOBIN A1C
HEMOGLOBIN A1C: 8.9 % — AB (ref 4.8–5.6)
MEAN PLASMA GLUCOSE: 208.73 mg/dL

## 2018-07-14 LAB — ECHOCARDIOGRAM COMPLETE
Height: 69 in
Weight: 3392 oz

## 2018-07-14 LAB — MAGNESIUM: Magnesium: 1.7 mg/dL (ref 1.7–2.4)

## 2018-07-14 LAB — RAPID URINE DRUG SCREEN, HOSP PERFORMED
AMPHETAMINES: NOT DETECTED
Barbiturates: NOT DETECTED
Benzodiazepines: NOT DETECTED
Cocaine: NOT DETECTED
Opiates: NOT DETECTED
Tetrahydrocannabinol: NOT DETECTED

## 2018-07-14 LAB — PHOSPHORUS: Phosphorus: 3.7 mg/dL (ref 2.5–4.6)

## 2018-07-14 MED ORDER — PERFLUTREN LIPID MICROSPHERE
1.0000 mL | INTRAVENOUS | Status: AC | PRN
  Administered 2018-07-14: 2 mL via INTRAVENOUS
  Filled 2018-07-14: qty 10

## 2018-07-14 NOTE — Progress Notes (Signed)
Occupational Therapy Evaluation Patient Details Name: Andre Wolfe MRN: [id number] DOB: 04-23-1935 Today's Date: 07/14/2018    History of Present Illness 83 y.o. male with history of peripheral neuropathy, hypertension, hyperlipidemia, diabetes, tobacco abuse, and now atrial fibrillation.    As of December 24 he was deconditioned but on Christmas day they noticed a significant difference in his strength in his legs. MRI + R paramedian pons acute/subacute infarct; additional punctate infact L parietal cortex.   Clinical Impression   PTA, pt lived at home with his wife and was modified independent with ADL and mobility @ cane level in community. Since Christmas, pt has had more difficulty with mobility and started using a RW. Pt has experienced 2 falls since that time and has required more assistance with his self care. Feel pt is an excellent candidate for CIR to reach modified independent level goals. Supportive family who can provide 10 S at DC. OT will follow acutely to maximize independence.  I have discussed the patient's current level of function related to ADL and mobility with the patient and his daughter.  They acknowledge understanding of this and do not feel the patient would be able to have their care needs met at home.  They are interested in post-acute rehab in an inpatient setting.   .     Follow Up Recommendations  CIR;Supervision/Assistance - 24 hour    Equipment Recommendations  None recommended by OT    Recommendations for Other Services Rehab consult     Precautions / Restrictions Precautions Precautions: Fall      Mobility Bed Mobility               General bed mobility comments: OOB in chair  Transfers Overall transfer level: Needs assistance Equipment used: Rolling walker (2 wheeled) Transfers: Sit to/from Stand Sit to Stand: Mod assist         General transfer comment: Pt rocking to get up unsuccessfully; required physical assist to power up  and prevent fall posteriorly    Balance Overall balance assessment: Needs assistance;History of Falls(2 falls in 1 week )   Sitting balance-Leahy Scale: Good       Standing balance-Leahy Scale: Poor                             ADL either performed or assessed with clinical judgement   ADL Overall ADL's : Needs assistance/impaired Eating/Feeding: Set up   Grooming: Minimal assistance;Standing Grooming Details (indicate cue type and reason): Appeasr to demonstrate motor impersistance in standing LLE; "melts down" Upper Body Bathing: Minimal assistance;Sitting   Lower Body Bathing: Minimal assistance;Sit to/from stand   Upper Body Dressing : Minimal assistance;Sitting   Lower Body Dressing: Moderate assistance;Sit to/from stand   Toilet Transfer: Minimal assistance;Ambulation;Comfort height toilet;Grab bars   Toileting- Clothing Manipulation and Hygiene: Minimal assistance;Sit to/from stand Toileting - Clothing Manipulation Details (indicate cue type and reason): vc to clean thoroughly     Functional mobility during ADLs: Minimal assistance;Rolling walker;Cueing for safety;Cueing for sequencing General ADL Comments: Problem solving appeasr slow; decreased awareness at times; unaware of "draggin LLE when walking; trying to step at times when LLE not fully underneath him; posteiror lean whe attempting to donn pants; appesr unaware that he is leaning posteriorly     Vision Baseline Vision/History: Wears glasses Wears Glasses: Reading only Vision Assessment?: Yes Eye Alignment: Within Functional Limits Ocular Range of Motion: Within Functional Limits Alignment/Gaze Preference: Within Defined Limits  Tracking/Visual Pursuits: Able to track stimulus in all quads without difficulty Saccades: Within functional limits Convergence: Within functional limits Visual Fields: (appeasr intact)     Perception Perception Comments: L inattention at times   Praxis  Praxis Praxis tested?: Deficits Deficits: Motor Impersistence    Pertinent Vitals/Pain Pain Assessment: No/denies pain     Hand Dominance Right   Extremity/Trunk Assessment Upper Extremity Assessment Upper Extremity Assessment: LUE deficits/detail LUE Deficits / Details: generalized weakness; moving WFL AROM with exception of shoulder flexion limited to @ 100. Poor inhand manipulation skills; strength @ 3+/5 throughout although stronger proxmially LUE Coordination: decreased fine motor;decreased gross motor   Lower Extremity Assessment Lower Extremity Assessment: Defer to PT evaluation(Difficulty maintaining contraction of LLE in standing)   Cervical / Trunk Assessment Cervical / Trunk Assessment: Kyphotic;Other exceptions(forward head)   Communication Communication Communication: HOH   Cognition Arousal/Alertness: Awake/alert Behavior During Therapy: WFL for tasks assessed/performed                                       General Comments       Exercises     Shoulder Instructions      Home Living Family/patient expects to be discharged to:: Private residence Living Arrangements: Spouse/significant other Available Help at Discharge: Family;Available 24 hours/day Type of Home: House Home Access: Level entry;Ramped entrance     Home Layout: One level     Bathroom Shower/Tub: Occupational psychologist: Standard Bathroom Accessibility: No   Home Equipment: Environmental consultant - 2 wheels;Cane - single point;Bedside commode;Shower seat;Grab bars - tub/shower;Wheelchair - manual          Prior Functioning/Environment Level of Independence: Independent with assistive device(s)        Comments: Just prior to coming to the hospital was using a RW; @ 2 months ago was using a cane in the community only; went shopping with his wife - used grocery cart; does not drive(Had 2 falls in 1 week since Christmas)        OT Problem List: Decreased  strength;Decreased activity tolerance;Decreased range of motion;Impaired balance (sitting and/or standing);Decreased coordination;Decreased cognition;Decreased safety awareness;Decreased knowledge of use of DME or AE;Impaired UE functional use      OT Treatment/Interventions: Self-care/ADL training;Therapeutic exercise;Neuromuscular education;Energy conservation;DME and/or AE instruction;Therapeutic activities;Cognitive remediation/compensation;Visual/perceptual remediation/compensation;Patient/family education;Balance training    OT Goals(Current goals can be found in the care plan section) Acute Rehab OT Goals Patient Stated Goal: " to get stronger" OT Goal Formulation: With patient/family Time For Goal Achievement: 07/28/18 Potential to Achieve Goals: Good  OT Frequency: Min 2X/week   Barriers to D/C:            Co-evaluation              AM-PAC OT "6 Clicks" Daily Activity     Outcome Measure Help from another person eating meals?: None Help from another person taking care of personal grooming?: A Little Help from another person toileting, which includes using toliet, bedpan, or urinal?: A Lot Help from another person bathing (including washing, rinsing, drying)?: A Little Help from another person to put on and taking off regular upper body clothing?: A Lot Help from another person to put on and taking off regular lower body clothing?: A Lot 6 Click Score: 16   End of Session Equipment Utilized During Treatment: Gait belt;Rolling walker Nurse Communication: Mobility status  Activity Tolerance: Patient tolerated treatment  well Patient left: in chair;with call bell/phone within reach;with chair alarm set;with family/visitor present  OT Visit Diagnosis: Other abnormalities of gait and mobility (R26.89);Repeated falls (R29.6);Muscle weakness (generalized) (M62.81);Other symptoms and signs involving cognitive function;Hemiplegia and hemiparesis Hemiplegia - Right/Left:  Left Hemiplegia - dominant/non-dominant: Non-Dominant Hemiplegia - caused by: Cerebral infarction                Time: 8483-5075 OT Time Calculation (min): 34 min Charges:  OT General Charges $OT Visit: 1 Visit OT Evaluation $OT Eval Moderate Complexity: 1 Mod OT Treatments $Self Care/Home Management : 8-22 mins  Maurie Boettcher, OT/L   Acute OT Clinical Specialist Acute Rehabilitation Services Pager [phone] Office [phone]   [facility] 07/14/2018, 12:49 PM

## 2018-07-14 NOTE — Consult Note (Signed)
Physical Medicine and Rehabilitation Consult Reason for Consult:  Left side weakness Referring Physician: [facility]   HPI: Andre Wolfe is a 83 y.o.right handed male with history of peripheral neuropathy, hypertension, hyperlipidemia, diabetes mellitus, tobacco abuse, atrial fibrillation maintained on aspirin. Per chart review, patient, and daughter,patient lives with wife. Used a straight cane prior to admission. One level home with ramped entrance. Daughter works during the day. Presented 07/13/2018 with left-sided weakness. Daughter notes patient very sedentary on a daily basis will sit and watch TV go to the bathroom and back to the couch. Cranial CT scan reviewed showing brainstem infarct.  Per report, right pontine infarction age-indeterminate. Left occipital lobe infarct appeared to be more remote. MRI/MRA showed right paramedian pons acute early subacute infarction additional punctate infarcts in the left parietal cortex. No large vessel occlusion or aneurysm. Patient did not receive TPA. Echocardiogram pending. Neurology consulted and workup currently ongoing maintained on heparin. Therapy evaluations pending. M.D. has requested physical medicine rehabilitation consult.  Review of Systems  Constitutional: Negative for chills and fever.  HENT: Negative for hearing loss.   Eyes: Negative for blurred vision and double vision.  Respiratory: Negative for cough and shortness of breath.   Cardiovascular: Negative for chest pain, palpitations and leg swelling.  Gastrointestinal: Positive for constipation. Negative for nausea.       GERD  Genitourinary: Positive for urgency.  Musculoskeletal: Positive for joint pain and myalgias.  Skin: Negative for rash.  Neurological: Positive for tingling, sensory change, speech change and focal weakness.  All other systems reviewed and are negative.  Past Medical History:  Diagnosis Date  . Arrhythmia    post op Afib.  . Arthritis    "joints"  (07/13/2018)  . Coronary artery disease   . CVA (cerebral vascular accident) ([place]) 07/13/2018   right pontine infarct; "said he's had one in the past; probably 2 yrs ago; same symptoms" (07/13/2018)  . Diabetic peripheral neuropathy ([place])   . GERD (gastroesophageal reflux disease)   . Hyperlipidemia   . Hypertension   . Prostate cancer ([place])    d'x 2015/2016  . Type II diabetes mellitus ([place])    Past Surgical History:  Procedure Laterality Date  . CARDIAC CATHETERIZATION  2017   "not completed; went straight to [facility]"  . CATARACT EXTRACTION W/ INTRAOCULAR LENS  IMPLANT, BILATERAL Bilateral   . CORONARY ARTERY BYPASS GRAFT  2017   "CABG X 4"  . TONSILLECTOMY     Family History  Problem Relation Age of Onset  . Heart disease Mother   . Breast cancer Mother   . Leukemia Father   . Diabetes Brother   . Breast cancer Sister   . Stroke Neg Hx    Social History:  reports that he has been smoking. He has a 7.80 pack-year smoking history. He has never used smokeless tobacco. He reports previous alcohol use. He reports that he does not use drugs. Allergies: No Known Allergies Medications Prior to Admission  Medication Sig Dispense Refill  . amLODipine (NORVASC) 2.5 MG tablet Take 1 tablet (2.5 mg total) by mouth daily. 90 tablet 3  . aspirin EC 81 MG tablet Take 81 mg by mouth daily.    . [facility] OMEPRAZOLE PO Take 20 mg by mouth 4 (four) times daily.    Marland Kitchen gabapentin (NEURONTIN) 600 MG tablet Take 600 mg by mouth at bedtime.   1  . ibuprofen (ADVIL,MOTRIN) 200 MG tablet Take 600 mg by mouth every 6 (  six) hours as needed for moderate pain.    . Insulin Isophane & Regular Human (NOVOLIN 70/30 FLEXPEN) (70-30) 100 UNIT/ML PEN Inject 5-25 Units into the skin 3 (three) times daily. Taking 25 units in the Am and 5 units Midday, and 25 units before dinner.    Marland Kitchen losartan (COZAAR) 100 MG tablet Take 100 mg by mouth daily.    . metoprolol tartrate (LOPRESSOR) 25 MG tablet Take 25 mg by mouth 2 (two)  times daily.   3  . Evolocumab (REPATHA SURECLICK) 409 MG/ML SOAJ Inject 140 mg into the skin every 14 (fourteen) days. 2 pen 11    Home: Home Living Family/patient expects to be discharged to:: Unsure Living Arrangements: Spouse/significant other  Functional History:   Functional Status:  Mobility:          ADL:    Cognition: Cognition Orientation Level: Oriented X4    Blood pressure (!) 128/59, pulse (!) 51, temperature 98.3 F (36.8 C), temperature source Oral, resp. rate 18, height 5\' 9"  (1.753 m), weight 96.2 kg, SpO2 98 %. Physical Exam  Vitals reviewed. Constitutional: He is oriented to person, place, and time. He appears well-developed.  Obese  HENT:  Head: Normocephalic and atraumatic.  Eyes: EOM are normal. Right eye exhibits no discharge. Left eye exhibits no discharge.  Neck: Normal range of motion. Neck supple. No thyromegaly present.  Cardiovascular: Normal rate and regular rhythm.  Respiratory: Effort normal and breath sounds normal. No respiratory distress.  GI: Soft. Bowel sounds are normal. He exhibits no distension.  Musculoskeletal:     Comments: No edema or tenderness in extremities  Neurological: He is alert and oriented to person, place, and time.  Motor: Grossly 4+/5 throughout (right stronger than left) Sensation diminished to light touch plantar feet Dysarthria  Skin: Skin is warm and dry.  Psychiatric: He has a normal mood and affect. His behavior is normal.    Results for orders placed or performed during the hospital encounter of 07/13/18 (from the past 24 hour(s))  CBG monitoring, ED     Status: Abnormal   Collection Time: 07/13/18  9:42 AM  Result Value Ref Range   Glucose-Capillary 279 (H) 70 - 99 mg/dL  CBC     Status: None   Collection Time: 07/13/18 10:00 AM  Result Value Ref Range   WBC 7.5 4.0 - 10.5 K/uL   RBC 5.33 4.22 - 5.81 MIL/uL   Hemoglobin 16.0 13.0 - 17.0 g/dL   HCT 46.9 39.0 - 52.0 %   MCV 88.0 80.0 - 100.0 fL     MCH 30.0 26.0 - 34.0 pg   MCHC 34.1 30.0 - 36.0 g/dL   RDW 12.5 11.5 - 15.5 %   Platelets 225 150 - 400 K/uL   nRBC 0.0 0.0 - 0.2 %  Basic metabolic panel     Status: Abnormal   Collection Time: 07/13/18 10:00 AM  Result Value Ref Range   Sodium 138 135 - 145 mmol/L   Potassium 4.2 3.5 - 5.1 mmol/L   Chloride 103 98 - 111 mmol/L   CO2 25 22 - 32 mmol/L   Glucose, Bld 281 (H) 70 - 99 mg/dL   BUN 12 8 - 23 mg/dL   Creatinine, Ser 1.03 0.61 - 1.24 mg/dL   Calcium 9.1 8.9 - 10.3 mg/dL   GFR calc non Af Amer >60 >60 mL/min   GFR calc Af Amer >60 >60 mL/min   Anion gap 10 5 - 15  Protime-INR  Status: None   Collection Time: 07/13/18 10:00 AM  Result Value Ref Range   Prothrombin Time 14.3 11.4 - 15.2 seconds   INR 1.12   APTT     Status: None   Collection Time: 07/13/18 10:00 AM  Result Value Ref Range   aPTT 26 24 - 36 seconds  Urinalysis, Routine w reflex microscopic     Status: Abnormal   Collection Time: 07/13/18  1:21 PM  Result Value Ref Range   Color, Urine YELLOW YELLOW   APPearance CLEAR CLEAR   Specific Gravity, Urine 1.028 1.005 - 1.030   pH 5.0 5.0 - 8.0   Glucose, UA >=500 (A) NEGATIVE mg/dL   Hgb urine dipstick NEGATIVE NEGATIVE   Bilirubin Urine NEGATIVE NEGATIVE   Ketones, ur NEGATIVE NEGATIVE mg/dL   Protein, ur 30 (A) NEGATIVE mg/dL   Nitrite NEGATIVE NEGATIVE   Leukocytes, UA NEGATIVE NEGATIVE   RBC / HPF 0-5 0 - 5 RBC/hpf   WBC, UA 0-5 0 - 5 WBC/hpf   Bacteria, UA NONE SEEN NONE SEEN   Mucus PRESENT   Glucose, capillary     Status: Abnormal   Collection Time: 07/13/18  4:35 PM  Result Value Ref Range   Glucose-Capillary 177 (H) 70 - 99 mg/dL   Comment 1 Notify RN    Comment 2 Document in Chart   Glucose, capillary     Status: Abnormal   Collection Time: 07/13/18  9:07 PM  Result Value Ref Range   Glucose-Capillary 233 (H) 70 - 99 mg/dL  Heparin level (unfractionated)     Status: None   Collection Time: 07/14/18 12:13 AM  Result Value Ref  Range   Heparin Unfractionated 0.32 0.30 - 0.70 IU/mL  CBC     Status: None   Collection Time: 07/14/18 12:13 AM  Result Value Ref Range   WBC 6.7 4.0 - 10.5 K/uL   RBC 5.05 4.22 - 5.81 MIL/uL   Hemoglobin 14.7 13.0 - 17.0 g/dL   HCT 43.4 39.0 - 52.0 %   MCV 85.9 80.0 - 100.0 fL   MCH 29.1 26.0 - 34.0 pg   MCHC 33.9 30.0 - 36.0 g/dL   RDW 12.3 11.5 - 15.5 %   Platelets 212 150 - 400 K/uL   nRBC 0.0 0.0 - 0.2 %  Hemoglobin A1c     Status: Abnormal   Collection Time: 07/14/18 12:13 AM  Result Value Ref Range   Hgb A1c MFr Bld 8.9 (H) 4.8 - 5.6 %   Mean Plasma Glucose 208.73 mg/dL  Lipid panel     Status: Abnormal   Collection Time: 07/14/18 12:13 AM  Result Value Ref Range   Cholesterol 211 (H) 0 - 200 mg/dL   Triglycerides 139 <150 mg/dL   HDL 30 (L) >40 mg/dL   Total CHOL/HDL Ratio 7.0 RATIO   VLDL 28 0 - 40 mg/dL   LDL Cholesterol 153 (H) 0 - 99 mg/dL  TSH     Status: None   Collection Time: 07/14/18 12:13 AM  Result Value Ref Range   TSH 2.482 0.350 - 4.500 uIU/mL   Ct Head Wo Contrast  Result Date: 07/13/2018 CLINICAL DATA:  Left leg weakness, left facial droop and slurred speech since 07/08/2018. EXAM: CT HEAD WITHOUT CONTRAST TECHNIQUE: Contiguous axial images were obtained from the base of the skull through the vertex without intravenous contrast. COMPARISON:  None. FINDINGS: Brain: The patient has an age indeterminate infarct in the right pons. Left occipital lobe infarct appears  more remote but can not be definitively characterized. No mass, hemorrhage, midline shift or abnormal extra-axial fluid collection is identified. No hydrocephalus or pneumocephalus. Vascular: Atherosclerosis is noted. Skull: Intact.  No focal lesion. Sinuses/Orbits: Small mucous retention cyst or polyp left maxillary sinus noted. There is minimal mucosal thickening in the right maxillary sinus. Other: None. IMPRESSION: Right pontine infarct is age indeterminate. Left occipital lobe infarct appears  more remote but also can not be definitively characterized. Atrophy and chronic microvascular ischemic change. Atherosclerosis. Electronically Signed   By: Inge Rise M.D.   On: 07/13/2018 11:27   Mr Jodene Nam Head Wo Contrast  Result Date: 07/13/2018 CLINICAL DATA:  83 y/o M; left leg weakness, slurred speech, left facial droop 07/08/2018. EXAM: MRI HEAD WITHOUT CONTRAST MRA HEAD WITHOUT CONTRAST TECHNIQUE: Multiplanar, multiecho pulse sequences of the brain and surrounding structures were obtained without intravenous contrast. Angiographic images of the head were obtained using MRA technique without contrast. COMPARISON:  07/13/2018 CT head. FINDINGS: MRI HEAD FINDINGS Brain: Right paramedian pontine focus of reduced diffusion measuring 15 x 10 mm (AP by ML series 5, image 64) compatible with acute/early subacute infarction. Additional punctate focus of infarction within the left parietal cortex. No associated hemorrhage or mass effect. Several nonspecific T2 FLAIR hyperintensities in subcortical and periventricular white matter are compatible with mild chronic microvascular ischemic changes for age. Mild volume loss of the brain. No extra-axial collection, hydrocephalus, mass effect, or herniation. No abnormal susceptibility hypointensity to indicate intracranial hemorrhage. Vascular: Normal flow voids. Skull and upper cervical spine: Normal marrow signal. Sinuses/Orbits: Mild left maxillary sinus mucosal thickening. No additional abnormal signal of the visible paranasal sinuses and mastoid air cells. Bilateral intra-ocular lens replacement. Other: None. MRA HEAD FINDINGS Internal carotid arteries: Patent. Lumen irregularity of the internal carotid arteries compatible with atherosclerosis. Mild proximal left cavernous stenosis and mild-to-moderate right paraclinoid ICA stenosis. Anterior cerebral arteries: Patent. Large left A1 and anterior communicating artery, normal variant. Middle cerebral arteries:  Patent. Mild non stenotic lumen irregularity of M1 segments compatible with atherosclerosis. Right M2 inferior division origin mild stenosis. Anterior communicating artery: Patent. Posterior communicating arteries:  Patent. Posterior cerebral arteries: Patent. Moderate left P1 stenosis. Mild distal right P2 stenosis. Basilar artery:  Patent.  Mild mid basilar stenosis. Vertebral arteries:  Patent. No aneurysm or large vessel occlusion. IMPRESSION: MRI head: 1. Right paramedian pons acute/early subacute infarction. Additional punctate infarction in left parietal cortex. No hemorrhage or mass effect. 2. Mild for age chronic microvascular ischemic changes and volume loss of the brain. MRA head: 1. No large vessel occlusion or aneurysm identified. 2. Intracranial atherosclerosis with multiple segments of mild-to-moderate stenosis in the anterior and posterior circulation. Electronically Signed   By: Kristine Garbe M.D.   On: 07/13/2018 13:45   Mr Brain Wo Contrast  Result Date: 07/13/2018 CLINICAL DATA:  83 y/o M; left leg weakness, slurred speech, left facial droop 07/08/2018. EXAM: MRI HEAD WITHOUT CONTRAST MRA HEAD WITHOUT CONTRAST TECHNIQUE: Multiplanar, multiecho pulse sequences of the brain and surrounding structures were obtained without intravenous contrast. Angiographic images of the head were obtained using MRA technique without contrast. COMPARISON:  07/13/2018 CT head. FINDINGS: MRI HEAD FINDINGS Brain: Right paramedian pontine focus of reduced diffusion measuring 15 x 10 mm (AP by ML series 5, image 64) compatible with acute/early subacute infarction. Additional punctate focus of infarction within the left parietal cortex. No associated hemorrhage or mass effect. Several nonspecific T2 FLAIR hyperintensities in subcortical and periventricular white matter are compatible with  mild chronic microvascular ischemic changes for age. Mild volume loss of the brain. No extra-axial collection,  hydrocephalus, mass effect, or herniation. No abnormal susceptibility hypointensity to indicate intracranial hemorrhage. Vascular: Normal flow voids. Skull and upper cervical spine: Normal marrow signal. Sinuses/Orbits: Mild left maxillary sinus mucosal thickening. No additional abnormal signal of the visible paranasal sinuses and mastoid air cells. Bilateral intra-ocular lens replacement. Other: None. MRA HEAD FINDINGS Internal carotid arteries: Patent. Lumen irregularity of the internal carotid arteries compatible with atherosclerosis. Mild proximal left cavernous stenosis and mild-to-moderate right paraclinoid ICA stenosis. Anterior cerebral arteries: Patent. Large left A1 and anterior communicating artery, normal variant. Middle cerebral arteries: Patent. Mild non stenotic lumen irregularity of M1 segments compatible with atherosclerosis. Right M2 inferior division origin mild stenosis. Anterior communicating artery: Patent. Posterior communicating arteries:  Patent. Posterior cerebral arteries: Patent. Moderate left P1 stenosis. Mild distal right P2 stenosis. Basilar artery:  Patent.  Mild mid basilar stenosis. Vertebral arteries:  Patent. No aneurysm or large vessel occlusion. IMPRESSION: MRI head: 1. Right paramedian pons acute/early subacute infarction. Additional punctate infarction in left parietal cortex. No hemorrhage or mass effect. 2. Mild for age chronic microvascular ischemic changes and volume loss of the brain. MRA head: 1. No large vessel occlusion or aneurysm identified. 2. Intracranial atherosclerosis with multiple segments of mild-to-moderate stenosis in the anterior and posterior circulation. Electronically Signed   By: Kristine Garbe M.D.   On: 07/13/2018 13:45    Assessment/Plan: Diagnosis: Right pontine infarction  Labs and images (see above) independently reviewed.  Records reviewed and summated above. Stroke: Continue secondary stroke prophylaxis and Risk Factor  Modification listed below:   Antiplatelet therapy:   Blood Pressure Management:  Continue current medication with prn's with permisive HTN per primary team Statin Agent:   Diabetes management:   Tobacco abuse:   Right sided hemiparesisMotor recovery: Fluoxetine  1. Does the need for close, 24 hr/day medical supervision in concert with the patient's rehab needs make it unreasonable for this patient to be served in a less intensive setting? Potentially  2. Co-Morbidities requiring supervision/potential complications: peripheral neuropathy, HTN (monitor and provide prns in accordance with increased physical exertion and pain), hyperlipidemia, DM (Monitor in accordance with exercise and adjust meds as necessary), tobacco abuse (counsel), atrial fibrillation (resume meds when appropriate) 3. Due to safety, disease management and patient education, does the patient require 24 hr/day rehab nursing? Potentially 4. Does the patient require coordinated care of a physician, rehab nurse, PT (1-2 hrs/day, 5 days/week) and OT (1-2 hrs/day, 5 days/week) to address physical and functional deficits in the context of the above medical diagnosis(es)? Potentially Addressing deficits in the following areas: balance, endurance, locomotion, strength, transferring, bathing, dressing, toileting and psychosocial support 5. Can the patient actively participate in an intensive therapy program of at least 3 hrs of therapy per day at least 5 days per week? Potentially 6. The potential for patient to make measurable gains while on inpatient rehab is TBD 7. Anticipated functional outcomes upon discharge from inpatient rehab are TBD  with PT, TBD with OT, n/a with SLP. 8. Estimated rehab length of stay to reach the above functional goals is: TBD. 9. Anticipated D/C setting: Home 10. Anticipated post D/C treatments: HH therapy and Home excercise program 11. Overall Rehab/Functional Prognosis: good  RECOMMENDATIONS: This  patient's condition is appropriate for continued rehabilitative care in the following setting: Will await completion of therapy eval's, however patient sedentary at baseline.  Will consider CIR if patient far from baseline  and able to tolerate 3 hours of therapy per day after completion of medical work-up. Patient has agreed to participate in recommended program. Potentially Note that insurance prior authorization may be required for reimbursement for recommended care.  Comment: Rehab Admissions Coordinator to follow up.   I have personally performed a face to face diagnostic evaluation, including, but not limited to relevant history and physical exam findings, of this patient and developed relevant assessment and plan.  Additionally, I have reviewed and concur with the physician assistant's documentation above.   Delice Lesch, MD, ABPMR Lavon Paganini Angiulli, PA-C 07/14/2018

## 2018-07-14 NOTE — Progress Notes (Addendum)
STROKE TEAM PROGRESS NOTE   INTERVAL HISTORY His daughter is at the bedside.  He is anxious to get OOB. Has not yet worked with therapy. Anticipate CIR stay, depending on recommendations. 2D pending. Per dtr, tech said pt had an "event" during echo that sounds to confirms AF and long-term AC decision.   Vitals:   07/13/18 2006 07/13/18 2337 07/14/18 0333 07/14/18 0739  BP: (!) 112/58 (!) 119/51 (!) 128/59 (!) 146/68  Pulse: (!) 54 (!) 55 (!) 51 (!) 58  Resp: 18 18 18 15   Temp: 98.1 F (36.7 C) 98.4 F (36.9 C) 98.3 F (36.8 C) 98 F (36.7 C)  TempSrc: Oral Oral Oral Oral  SpO2: 100% 100% 98% 96%  Weight:      Height:        CBC:  Recent Labs  Lab 07/13/18 1000 07/14/18 0013  WBC 7.5 6.7  HGB 16.0 14.7  HCT 46.9 43.4  MCV 88.0 85.9  PLT 225 354    Basic Metabolic Panel:  Recent Labs  Lab 07/13/18 1000  NA 138  K 4.2  CL 103  CO2 25  GLUCOSE 281*  BUN 12  CREATININE 1.03  CALCIUM 9.1   Lipid Panel:     Component Value Date/Time   CHOL 211 (H) 07/14/2018 0013   TRIG 139 07/14/2018 0013   HDL 30 (L) 07/14/2018 0013   CHOLHDL 7.0 07/14/2018 0013   VLDL 28 07/14/2018 0013   LDLCALC 153 (H) 07/14/2018 0013   HgbA1c:  Lab Results  Component Value Date   HGBA1C 8.9 (H) 07/14/2018   Urine Drug Screen:     Component Value Date/Time   LABOPIA NONE DETECTED 07/14/2018 0754   COCAINSCRNUR NONE DETECTED 07/14/2018 0754   LABBENZ NONE DETECTED 07/14/2018 0754   AMPHETMU NONE DETECTED 07/14/2018 0754   THCU NONE DETECTED 07/14/2018 0754   LABBARB NONE DETECTED 07/14/2018 0754    Alcohol Level No results found for: ETH  IMAGING Ct Head Wo Contrast  Result Date: 07/13/2018 CLINICAL DATA:  Left leg weakness, left facial droop and slurred speech since 07/08/2018. EXAM: CT HEAD WITHOUT CONTRAST TECHNIQUE: Contiguous axial images were obtained from the base of the skull through the vertex without intravenous contrast. COMPARISON:  None. FINDINGS: Brain: The  patient has an age indeterminate infarct in the right pons. Left occipital lobe infarct appears more remote but can not be definitively characterized. No mass, hemorrhage, midline shift or abnormal extra-axial fluid collection is identified. No hydrocephalus or pneumocephalus. Vascular: Atherosclerosis is noted. Skull: Intact.  No focal lesion. Sinuses/Orbits: Small mucous retention cyst or polyp left maxillary sinus noted. There is minimal mucosal thickening in the right maxillary sinus. Other: None. IMPRESSION: Right pontine infarct is age indeterminate. Left occipital lobe infarct appears more remote but also can not be definitively characterized. Atrophy and chronic microvascular ischemic change. Atherosclerosis. Electronically Signed   By: Inge Rise M.D.   On: 07/13/2018 11:27   Mr Jodene Nam Head Wo Contrast  Result Date: 07/13/2018 CLINICAL DATA:  83 y/o M; left leg weakness, slurred speech, left facial droop 07/08/2018. EXAM: MRI HEAD WITHOUT CONTRAST MRA HEAD WITHOUT CONTRAST TECHNIQUE: Multiplanar, multiecho pulse sequences of the brain and surrounding structures were obtained without intravenous contrast. Angiographic images of the head were obtained using MRA technique without contrast. COMPARISON:  07/13/2018 CT head. FINDINGS: MRI HEAD FINDINGS Brain: Right paramedian pontine focus of reduced diffusion measuring 15 x 10 mm (AP by ML series 5, image 64) compatible with acute/early subacute infarction.  Additional punctate focus of infarction within the left parietal cortex. No associated hemorrhage or mass effect. Several nonspecific T2 FLAIR hyperintensities in subcortical and periventricular white matter are compatible with mild chronic microvascular ischemic changes for age. Mild volume loss of the brain. No extra-axial collection, hydrocephalus, mass effect, or herniation. No abnormal susceptibility hypointensity to indicate intracranial hemorrhage. Vascular: Normal flow voids. Skull and upper  cervical spine: Normal marrow signal. Sinuses/Orbits: Mild left maxillary sinus mucosal thickening. No additional abnormal signal of the visible paranasal sinuses and mastoid air cells. Bilateral intra-ocular lens replacement. Other: None. MRA HEAD FINDINGS Internal carotid arteries: Patent. Lumen irregularity of the internal carotid arteries compatible with atherosclerosis. Mild proximal left cavernous stenosis and mild-to-moderate right paraclinoid ICA stenosis. Anterior cerebral arteries: Patent. Large left A1 and anterior communicating artery, normal variant. Middle cerebral arteries: Patent. Mild non stenotic lumen irregularity of M1 segments compatible with atherosclerosis. Right M2 inferior division origin mild stenosis. Anterior communicating artery: Patent. Posterior communicating arteries:  Patent. Posterior cerebral arteries: Patent. Moderate left P1 stenosis. Mild distal right P2 stenosis. Basilar artery:  Patent.  Mild mid basilar stenosis. Vertebral arteries:  Patent. No aneurysm or large vessel occlusion. IMPRESSION: MRI head: 1. Right paramedian pons acute/early subacute infarction. Additional punctate infarction in left parietal cortex. No hemorrhage or mass effect. 2. Mild for age chronic microvascular ischemic changes and volume loss of the brain. MRA head: 1. No large vessel occlusion or aneurysm identified. 2. Intracranial atherosclerosis with multiple segments of mild-to-moderate stenosis in the anterior and posterior circulation. Electronically Signed   By: Kristine Garbe M.D.   On: 07/13/2018 13:45   Mr Brain Wo Contrast  Result Date: 07/13/2018 CLINICAL DATA:  83 y/o M; left leg weakness, slurred speech, left facial droop 07/08/2018. EXAM: MRI HEAD WITHOUT CONTRAST MRA HEAD WITHOUT CONTRAST TECHNIQUE: Multiplanar, multiecho pulse sequences of the brain and surrounding structures were obtained without intravenous contrast. Angiographic images of the head were obtained using MRA  technique without contrast. COMPARISON:  07/13/2018 CT head. FINDINGS: MRI HEAD FINDINGS Brain: Right paramedian pontine focus of reduced diffusion measuring 15 x 10 mm (AP by ML series 5, image 64) compatible with acute/early subacute infarction. Additional punctate focus of infarction within the left parietal cortex. No associated hemorrhage or mass effect. Several nonspecific T2 FLAIR hyperintensities in subcortical and periventricular white matter are compatible with mild chronic microvascular ischemic changes for age. Mild volume loss of the brain. No extra-axial collection, hydrocephalus, mass effect, or herniation. No abnormal susceptibility hypointensity to indicate intracranial hemorrhage. Vascular: Normal flow voids. Skull and upper cervical spine: Normal marrow signal. Sinuses/Orbits: Mild left maxillary sinus mucosal thickening. No additional abnormal signal of the visible paranasal sinuses and mastoid air cells. Bilateral intra-ocular lens replacement. Other: None. MRA HEAD FINDINGS Internal carotid arteries: Patent. Lumen irregularity of the internal carotid arteries compatible with atherosclerosis. Mild proximal left cavernous stenosis and mild-to-moderate right paraclinoid ICA stenosis. Anterior cerebral arteries: Patent. Large left A1 and anterior communicating artery, normal variant. Middle cerebral arteries: Patent. Mild non stenotic lumen irregularity of M1 segments compatible with atherosclerosis. Right M2 inferior division origin mild stenosis. Anterior communicating artery: Patent. Posterior communicating arteries:  Patent. Posterior cerebral arteries: Patent. Moderate left P1 stenosis. Mild distal right P2 stenosis. Basilar artery:  Patent.  Mild mid basilar stenosis. Vertebral arteries:  Patent. No aneurysm or large vessel occlusion. IMPRESSION: MRI head: 1. Right paramedian pons acute/early subacute infarction. Additional punctate infarction in left parietal cortex. No hemorrhage or mass  effect. 2. Mild  for age chronic microvascular ischemic changes and volume loss of the brain. MRA head: 1. No large vessel occlusion or aneurysm identified. 2. Intracranial atherosclerosis with multiple segments of mild-to-moderate stenosis in the anterior and posterior circulation. Electronically Signed   By: Kristine Garbe M.D.   On: 07/13/2018 13:45   Carotid Doppler   There is 1-39% bilateral ICA stenosis. Vertebral artery flow is antegrade. > 50% ECA stenosis.  2D Echocardiogram  - Left ventricle: The cavity size was normal. Wall thickness was normal. Systolic function was normal. The estimated ejection fraction was in the range of 60% to 65%. Wall motion was normal; there were no regional wall motion abnormalities. Features are consistent with a pseudonormal left ventricular filling pattern, with concomitant abnormal relaxation and increased filling pressure (grade 2 diastolic dysfunction). Acoustic contrast opacification revealed no evidence ofthrombus. - Ventricular septum: Septal motion showed paradox. - Mitral valve: Calcified annulus. Valve area by pressure half-time: 2.32 cm^2. - Left atrium: The atrium was mildly dilated.   PHYSICAL EXAM Constitutional: Overweight and well-nourished.  Psych: Affect appropriate to situation Eyes: No scleral injection HENT: No OP obstrucion Head: Normocephalic.  Cardiovascular: Normal rate and regular rhythm.  Respiratory: Effort normal, non-labored breathing GI: Soft.  No distension. There is no tenderness.  Skin: WDI  Neuro: Mental Status: Patient is awake, alert, oriented to person, place, month, year, and situation. Patient is able to give a clear and coherent history. No signs of aphasia but does have mild dysarthria Cranial Nerves: II: Visual Fields are full. Pupils are equal, round, and reactive to light.   III,IV, VI: EOMI without ptosis or diploplia.  V: Facial sensation is symmetric to temperature VII: Left facial droop   VIII: hearing is intact to voice X: Uvula elevates symmetrically XI: Shoulder shrug is symmetric. XII: tongue is midline without atrophy or fasciculations.  Motor: Right upper extremity shows 5/5, left upper extremity shows 4/5 with tricep extension, L arm drift; right leg is 5 out of 5, left leg is 4/5 with drift. R orbits L. Decreased FMM L hand Sensory: Sensation is symmetric to light touch and temperature in the arms and legs. Plantars: Toes are downgoing bilaterally. Cerebellar: Finger finger showed no dysmetria, left heel-to-shin did show dysmetria and no dysmetria on right heel shin-believe this is because of strength   ASSESSMENT/PLAN Mr. NAOD SWEETLAND is a 83 y.o. male with history of post-op AF with AC stopped in Sept, HTN, HLD, DB, PN, tobacco use, CAD s/p CABG and GERD presenting with deconditioning and L HP.   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 Small vessel disease. Atrophy. Atherosclerosis.  MRI  R paramedian pontine infarct. Lparietal cortex infarct. Small vessel disease. Atrophy.   MRA  No LVO. atherosclerosis w/ mild to mod stenosis   Carotid Doppler  B ICA 1-39% stenosis, VAs antegrade. > 50% ECA stenosis  2D Echo  EF 60-65%. No source of embolus - though NO AF mentioned in report.   LDL 153  HgbA1c 8.9  IV heparin for VTE prophylaxis  aspirin 81 mg daily prior to admission, now on aspirin 81 mg daily and heparin IV. Recommend conversion to Eliquis for secondary stroke prevention. No indication for additional aspirin from stroke standpoint  Therapy recommendations:  CIR. Consult in place  Disposition:  pending   Unconfirmed Atrial Fibrillation  Hx post op  AF w/ AC stopped in Sept 2019, followed by Dr. Martinique  Home anticoagulation:  none   AC not recommended for transient post-op AF  Current infarcts appear  embolic  Now on IV heparin  Spoke with [place], echo tech to confirm pt did not have AF during echo as pt reported he had a "spell" while there  Stroke guidelines recommend confirmation of embolic source prior to starting [facility]. Await cardiology opinion  Cardiology consult pending   Hypertension  Stable . Permissive hypertension (OK if < 220/120) but gradually normalize in 5-7 days . Long-term BP goal normotensive  Hyperlipidemia  Home meds:  Repatha - prescribed and has at home though pt has not yet started  LDL 153, goal < 70  Encouraged pt and dtr to start ASAP  Continue statin at discharge  Diabetes type II  HgbA1c 8.9, goal < 7.0  Uncontrolled  Other Stroke Risk Factors  Advanced age  Cigarette smoker, advised to stop smoking  Obesity, Body mass index is 31.31 kg/m., recommend weight loss, diet and exercise as appropriate   Coronary artery disease s/p CABG  Hospital day # [place], MSN, APRN, ANVP-BC, AGPCNP-BC Advanced Practice Stroke Nurse [place] for Schedule & Pager information 07/14/2018 2:47 PM   ATTENDING NOTE: I reviewed above note and agree with the assessment and plan. Pt was seen and examined.   83 year old male with history of prostate cancer, CAD, DM, hypertension, HLD on Repatha, smoker, postop A. fib off Eliquis admitted for left leg weakness and deconditioning.  CT showed right pontine infarct and questionable left PCA infarct.  MRI showed right pontine infarct and left parietal cortex punctate infarct, but no PCA infarct on the left.  MRI showed bilateral siphon and bilateral PCA atherosclerosis.  Carotid Doppler and 2D echo unremarkable.  EF 60 to 65%.  LDL 153 and A1c 8.9.  On examination, patient awake alert, AAO x3.  Cranial nerve intact.  Bilateral upper extremity equal strength, finger-to-nose intact.  Right lower extremity 5/5, right lower extremity proximal and distal 4/5.  Sensation symmetrical.   Gait not tested.  Patient stroke embolic pattern, concerning for cardioembolic infarct.  Given patient history of postop A. fib treated with amiodarone and Eliquis and underwent DCCV, likelihood of patient having PAF is high.  However, so far there is no definite evidence showing PAF by EKG and Dr. Martinique had discontinued Eliquis in 03/2018.  On this admission patient was put on heparin IV.  Cardiology consulted and recommended TEE on Thursday and plan for loop recorder if TEE negative for LV thrombus.  Will defer to cardiology for further management of antithrombotics.  Continue Repatha for hyperlipidemia, and stroke risk factor modification.  PT/OT recommend CIR.  Neurology will sign off. Please call with questions. Pt will follow up with stroke clinic NP at [facility] in about 4 weeks. Thanks for the consult.   Rosalin Hawking, MD PhD Stroke Neurology 07/14/2018 6:29 PM  I spent  35 minutes in total face-to-face time with the patient, more than 50% of which was spent in counseling and coordination of care, reviewing test results, images and medication, and discussing the diagnosis of PAF, embolic stroke, uncontrolled diabetes and hyperlipidemia, treatment plan and potential prognosis. This patient's care requiresreview of multiple databases, neurological assessment, discussion with family, other specialists and medical decision making of high complexity. I had long discussion with patient and the daughter at bedside, updated pt current condition, treatment plan and potential prognosis. They  expressed understanding and appreciation.      To contact Stroke Continuity provider, please refer to [email or web address] After hours, contact General Neurology

## 2018-07-14 NOTE — Evaluation (Signed)
Physical Therapy Evaluation Patient Details Name: Andre Wolfe MRN: [id number] DOB: Dec 08, 1934 Today's Date: 07/14/2018   History of Present Illness  83 y.o. male with history of peripheral neuropathy, hypertension, hyperlipidemia, diabetes, tobacco abuse, and now atrial fibrillation.    As of December 24 he was deconditioned but on Christmas day they noticed a significant difference in his strength in his legs. MRI + R paramedian pons acute/subacute infarct; additional punctate infact L parietal cortex.  Clinical Impression  Patient presents with decreased independence and safety with mobility due to poor safety and deficit awareness, decreased balance and decreased L LE strength/endurance.  Feel he is fall risk which with now possibly starting blood thinner needs to maximize safety and independence prior to d/c home so recommending inpatient rehab prior to d/c home. Issues discussed with pt, daughter and son.  They acknowledge understanding of this and do not feel the patient would be able to have their care needs met at home. They are interested in post-acute rehab in an inpatient setting. Family and patient in agreement.  Will follow up acutely as well.     Follow Up Recommendations CIR    Equipment Recommendations  Rolling walker with 5" wheels    Recommendations for Other Services       Precautions / Restrictions Precautions Precautions: Fall      Mobility  Bed Mobility Overal bed mobility: Needs Assistance Bed Mobility: Supine to Sit     Supine to sit: Min assist;HOB elevated     General bed mobility comments: to lift trunk, to supine assist for positioning  Transfers Overall transfer level: Needs assistance Equipment used: Rolling walker (2 wheeled) Transfers: Sit to/from Stand Sit to Stand: Min assist         General transfer comment: cues to push up from bed, sat on bed with uncontrolled descent despite cues to reach back  Ambulation/Gait Ambulation/Gait  assistance: Min assist Gait Distance (Feet): 200 Feet Assistive device: Rolling walker (2 wheeled) Gait Pattern/deviations: Step-to pattern;Step-through pattern;Decreased stride length;Decreased dorsiflexion - left;Shuffle     General Gait Details: over time with L LE dragging on floor and increased assist on turns with cues to pick up foot and to stay inside walker  Stairs            Wheelchair Mobility    Modified Rankin (Stroke Patients Only) Modified Rankin (Stroke Patients Only) Pre-Morbid Rankin Score: No symptoms Modified Rankin: Moderately severe disability     Balance Overall balance assessment: Needs assistance;History of Falls   Sitting balance-Leahy Scale: Fair Sitting balance - Comments: static balance okay, when testing coordination in sitting, LOB posterior and to L     Standing balance-Leahy Scale: Poor Standing balance comment: needs UE support for balance                             Pertinent Vitals/Pain Pain Assessment: No/denies pain    Home Living Family/patient expects to be discharged to:: Private residence Living Arrangements: Spouse/significant other Available Help at Discharge: Family;Available 24 hours/day Type of Home: House Home Access: Level entry;Ramped entrance     Home Layout: One level Home Equipment: Walker - 2 wheels;Cane - single point;Bedside commode;Shower seat;Grab bars - tub/shower;Wheelchair - manual      Prior Function Level of Independence: Independent with assistive device(s)         Comments: Just prior to coming to the hospital was using a RW; @ 2 months ago was using  a cane in the community only; went shopping with his wife - used grocery cart; does not drive     Hand Dominance   Dominant Hand: Right    Extremity/Trunk Assessment   Upper Extremity Assessment Upper Extremity Assessment: Defer to OT evaluation    Lower Extremity Assessment Lower Extremity Assessment: LLE deficits/detail;RLE  deficits/detail RLE Deficits / Details: AROM WFL, strength hip flexion 4+/5, knee extension 4+/5 RLE Sensation: history of peripheral neuropathy LLE Deficits / Details: AROM WFL, strength hip flexion 4/5, knee extension 4-/5 LLE Sensation: history of peripheral neuropathy    Cervical / Trunk Assessment Cervical / Trunk Assessment: Kyphotic;Other exceptions Cervical / Trunk Exceptions: forward head  Communication   Communication: HOH  Cognition Arousal/Alertness: Awake/alert Behavior During Therapy: WFL for tasks assessed/performed Overall Cognitive Status: Impaired/Different from baseline Area of Impairment: Safety/judgement                         Safety/Judgement: Decreased awareness of safety;Decreased awareness of deficits            General Comments General comments (skin integrity, edema, etc.): family in room awaiting MD visits; agreeable to CIR    Exercises     Assessment/Plan    PT Assessment Patient needs continued PT services  PT Problem List Decreased strength;Decreased balance;Decreased mobility;Decreased activity tolerance;Decreased safety awareness;Decreased coordination;Decreased knowledge of use of DME;Decreased knowledge of precautions       PT Treatment Interventions DME instruction;Functional mobility training;Balance training;Patient/family education;Therapeutic activities;Gait training;Therapeutic exercise;Neuromuscular re-education    PT Goals (Current goals can be found in the Care Plan section)  Acute Rehab PT Goals Patient Stated Goal: to go home, but agreeable to rehab PT Goal Formulation: With patient Time For Goal Achievement: 07/21/18    Frequency Min 4X/week   Barriers to discharge        Co-evaluation               AM-PAC PT "6 Clicks" Mobility  Outcome Measure Help needed turning from your back to your side while in a flat bed without using bedrails?: A Little Help needed moving from lying on your back to sitting  on the side of a flat bed without using bedrails?: A Little Help needed moving to and from a bed to a chair (including a wheelchair)?: A Little Help needed standing up from a chair using your arms (e.g., wheelchair or bedside chair)?: A Little Help needed to walk in hospital room?: A Little Help needed climbing 3-5 steps with a railing? : A Lot 6 Click Score: 17    End of Session Equipment Utilized During Treatment: Gait belt Activity Tolerance: Patient tolerated treatment well Patient left: with call bell/phone within reach;in bed;with family/visitor present   PT Visit Diagnosis: History of falling (Z91.81);Other abnormalities of gait and mobility (R26.89);Hemiplegia and hemiparesis Hemiplegia - Right/Left: Left Hemiplegia - dominant/non-dominant: Non-dominant Hemiplegia - caused by: Cerebral infarction    Time: 3710-6269 PT Time Calculation (min) (ACUTE ONLY): 23 min   Charges:   PT Evaluation $PT Eval Moderate Complexity: 1 Mod PT Treatments $Gait Training: 8-22 mins        Magda Kiel, [place] [facility] [phone] 07/14/2018   Reginia Naas 07/14/2018, 3:55 PM

## 2018-07-14 NOTE — Consult Note (Signed)
Cardiology Consultation:   Patient ID: [id number]; [id number]; January 06, 1935   Admit date: 07/13/2018 Date of Consult: 07/14/2018  Primary Care Provider: [name], MD Primary Cardiologist: Peter Martinique, MD Primary Electrophysiologist:  None  Chief Complaint: weakness, facial droop  Patient Profile:   Andre Wolfe is a 83 y.o. male with a hx of CAD s/p CABG 12/2015 with post-op atrial fib, reduced EF on OP note, TIAs, IDDM, HTN, HLD, peripheral neuropathy, prostate CA, GERD who is being seen today for the evaluation of stroke at the request of Dr. Lorin Mercy.  History of Present Illness:   Dr. Martinique outlines the patient's prior cardiac history well in his 03/2018 note. He moved  from the [place] area to be closer to family. In 2017 he began having angina. Stress test abnormal and he had subsequent CABG in [facility] [place] on 01/01/16. This was done off pump and included LIMA to the LAD, SVG to  [place] intermediate and SVG sequentially to OM1 and left posterolateral branch as a Y graft off the other SVG. He had post op AFib and was on amiodarone for a few weeks and anticoagulated with Eliquis. He had DCCV day 4 post op without clinical recurrence since that time. EF noted to be mildly reduced on Op note. He also has a history of possible TIAs. He was hospitalized in [place] in October 2017 with leg weakness and was in NSR then. CT head was normal.   He saw Dr. Martinique 03/2018 to establish care and was felt to be doing well. He was maintaining NSR therefore Eliquis was stopped and he was started on ASA. He was referred to pharmD clinic to start PCSK9. He presented to [facility] yesterday with neurologic changes. On Christmas Day his family noticed a change with walking, talking, weakness, and increased falls. He had been very deconditioned lately. He also developed a left facial droop and slurred speech. Imaging revealed right paramedian pons acute early subacute infarction and additional punctate  infarcts in the left parietal cortex. The infarct pattern appeared to be embolic so neurology placed the patient on IV heparin with recommendation to consider restarting Eliquis but request cardiology opinion. Telemetry and EKGs have shown NSR with occasional PACs/atrial runs but no clear atrial fib. His daughter reports that they were told he had an episode of atrial fibrillation while getting echocardiogram. Neurology note states, "Spoke with [facility], echo tech to confirm pt did not have AF during echo as pt reported he had a 'spell' while there." 2D Echo today showed EF 60-65%, grade 2 DD, mild LAE. Typically diastolic function is not able to be reported if the patient is in afib for the study. I will ask MD to review associated telemetry tracings. He is making steady progress with PT and is being considered for CIR. He denies any CP, palpitations, irregular skips, SOB or syncope.   Past Medical History:  Diagnosis Date  . Arrhythmia    post op Afib.  . Arthritis    "joints" (07/13/2018)  . Coronary artery disease    a. s/p CABG 12/2015.  Marland Kitchen CVA (cerebral vascular accident) ([place]) 07/13/2018   right pontine infarct; "said he's had one in the past; probably 2 yrs ago; same symptoms" (07/13/2018)  . Diabetic peripheral neuropathy ([place])   . GERD (gastroesophageal reflux disease)   . Hyperlipidemia   . Hypertension   . Postoperative atrial fibrillation ([place])   . Prostate cancer ([place])    d'x 2015/2016  .  TIA (transient ischemic attack)   . Type II diabetes mellitus ([place])     Past Surgical History:  Procedure Laterality Date  . CARDIAC CATHETERIZATION  2017   "not completed; went straight to [facility]"  . CATARACT EXTRACTION W/ INTRAOCULAR LENS  IMPLANT, BILATERAL Bilateral   . CORONARY ARTERY BYPASS GRAFT  2017   "CABG X 4"  . TONSILLECTOMY       Inpatient Medications: Scheduled Meds: . aspirin EC  81 mg Oral Daily  . gabapentin  600 mg Oral QHS  . insulin aspart  0-15 Units Subcutaneous  TID WC  . insulin aspart  0-5 Units Subcutaneous QHS  . insulin aspart protamine- aspart  25 Units Subcutaneous BID WC  . insulin aspart protamine- aspart  5 Units Subcutaneous Q1200  . pantoprazole  40 mg Oral Daily   Continuous Infusions: . heparin 1,300 Units/hr (07/14/18 1340)   PRN Meds: acetaminophen **OR** acetaminophen (TYLENOL) oral liquid 160 mg/5 mL **OR** acetaminophen, senna-docusate  Home Meds: Prior to Admission medications   Medication Sig Start Date End Date Taking? Authorizing Provider  amLODipine (NORVASC) 2.5 MG tablet Take 1 tablet (2.5 mg total) by mouth daily. 05/27/18 08/25/18 Yes Martinique, Peter M, MD  aspirin EC 81 MG tablet Take 81 mg by mouth daily.   Yes [provider]  [facility] OMEPRAZOLE PO Take 20 mg by mouth 4 (four) times daily.   Yes [provider]  gabapentin (NEURONTIN) 600 MG tablet Take 600 mg by mouth at bedtime.  11/20/17  Yes [provider]  ibuprofen (ADVIL,MOTRIN) 200 MG tablet Take 600 mg by mouth every 6 (six) hours as needed for moderate pain.   Yes [provider]  Insulin Isophane & Regular Human (NOVOLIN 70/30 FLEXPEN) (70-30) 100 UNIT/ML PEN Inject 5-25 Units into the skin 3 (three) times daily. Taking 25 units in the Am and 5 units Midday, and 25 units before dinner.   Yes [provider]  losartan (COZAAR) 100 MG tablet Take 100 mg by mouth daily.   Yes [provider]  metoprolol tartrate (LOPRESSOR) 25 MG tablet Take 25 mg by mouth 2 (two) times daily.  11/07/17  Yes [provider]  Evolocumab (REPATHA SURECLICK) 494 MG/ML SOAJ Inject 140 mg into the skin every 14 (fourteen) days. 06/26/18   Martinique, Peter M, MD    Allergies:   No Known Allergies  Social History:   Social History   Socioeconomic History  . Marital status: Married    Spouse name: Not on file  . Number of children: 2  . Years of education: Not on file  . Highest education level: Not on file  Occupational  History  . Occupation: farmer  Social Needs  . Financial resource strain: Not on file  . Food insecurity:    Worry: Not on file    Inability: Not on file  . Transportation needs:    Medical: Not on file    Non-medical: Not on file  Tobacco Use  . Smoking status: Current Every Day Smoker    Packs/day: 0.12    Years: 65.00    Pack years: 7.80  . Smokeless tobacco: Never Used  Substance and Sexual Activity  . Alcohol use: Not Currently  . Drug use: Never  . Sexual activity: Not on file  Lifestyle  . Physical activity:    Days per week: Not on file    Minutes per session: Not on file  . Stress: Not on file  Relationships  .  Social connections:    Talks on phone: Not on file    Gets together: Not on file    Attends religious service: Not on file    Active member of club or organization: Not on file    Attends meetings of clubs or organizations: Not on file    Relationship status: Not on file  . Intimate partner violence:    Fear of current or ex partner: Not on file    Emotionally abused: Not on file    Physically abused: Not on file    Forced sexual activity: Not on file  Other Topics Concern  . Not on file  Social History Narrative  . Not on file    Family History:   The patient's family history includes Breast cancer in his mother and sister; Diabetes in his brother; Heart disease in his mother; Leukemia in his father. There is no history of Stroke.  ROS:  Please see the history of present illness.  All other ROS reviewed and negative.     Physical Exam/Data:   Vitals:   07/13/18 2337 07/14/18 0333 07/14/18 0739 07/14/18 1125  BP: (!) 119/51 (!) 128/59 (!) 146/68 136/74  Pulse: (!) 55 (!) 51 (!) 58 63  Resp: 18 18 15 17   Temp: 98.4 F (36.9 C) 98.3 F (36.8 C) 98 F (36.7 C) 98.2 F (36.8 C)  TempSrc: Oral Oral Oral   SpO2: 100% 98% 96% 98%  Weight:      Height:        Intake/Output Summary (Last 24 hours) at 07/14/2018 1535 Last data filed at  07/14/2018 1000 Gross per 24 hour  Intake 120.56 ml  Output 200 ml  Net -79.44 ml   Filed Weights   07/13/18 0932  Weight: 96.2 kg   Body mass index is 31.31 kg/m.  General: Well developed, well nourished WM, in no acute distress. Head: Normocephalic, atraumatic, sclera non-icteric, no xanthomas, nares are without discharge.  Neck: Negative for carotid bruits. JVD not elevated. Lungs: Clear bilaterally to auscultation without wheezes, rales, or rhonchi. Breathing is unlabored. Heart: RRR with S1 S2. No murmurs, rubs, or gallops appreciated. Abdomen: Soft, non-tender, non-distended with normoactive bowel sounds. No hepatomegaly. No rebound/guarding. No obvious abdominal masses. Msk:  Strength and tone appear normal for age. Extremities: No clubbing or cyanosis. No edema.  Distal pedal pulses are 2+ and equal bilaterally. Neuro: Alert and oriented X 3. No facial asymmetry. No focal deficit. Moves all extremities spontaneously. Psych:  Responds to questions appropriately with a normal affect.  EKG:  The EKG was personally reviewed and demonstrates sinus bradycardia 57bpm, probable LAE, border LAD, prolonged QT interval 518ms but hand calculated at 426ms  Relevant CV Studies: 2d echo 07/14/18 Study Conclusions  - Left ventricle: The cavity size was normal. Wall thickness was   normal. Systolic function was normal. The estimated ejection   fraction was in the range of 60% to 65%. Wall motion was normal;   there were no regional wall motion abnormalities. Features are   consistent with a pseudonormal left ventricular filling pattern,   with concomitant abnormal relaxation and increased filling   pressure (grade 2 diastolic dysfunction). Acoustic contrast   opacification revealed no evidence ofthrombus. - Ventricular septum: Septal motion showed paradox. - Mitral valve: Calcified annulus. Valve area by pressure   half-time: 2.32 cm^2. - Left atrium: The atrium was mildly  dilated.   Laboratory Data:  Chemistry Recent Labs  Lab 07/13/18 1000  NA 138  K 4.2  CL 103  CO2 25  GLUCOSE 281*  BUN 12  CREATININE 1.03  CALCIUM 9.1  GFRNONAA >60  GFRAA >60  ANIONGAP 10    Hematology Recent Labs  Lab 07/13/18 1000 07/14/18 0013  WBC 7.5 6.7  RBC 5.33 5.05  HGB 16.0 14.7  HCT 46.9 43.4  MCV 88.0 85.9  MCH 30.0 29.1  MCHC 34.1 33.9  RDW 12.5 12.3  PLT 225 212    Radiology/Studies:  Ct Head Wo Contrast  Result Date: 07/13/2018 CLINICAL DATA:  Left leg weakness, left facial droop and slurred speech since 07/08/2018. EXAM: CT HEAD WITHOUT CONTRAST TECHNIQUE: Contiguous axial images were obtained from the base of the skull through the vertex without intravenous contrast. COMPARISON:  None. FINDINGS: Brain: The patient has an age indeterminate infarct in the right pons. Left occipital lobe infarct appears more remote but can not be definitively characterized. No mass, hemorrhage, midline shift or abnormal extra-axial fluid collection is identified. No hydrocephalus or pneumocephalus. Vascular: Atherosclerosis is noted. Skull: Intact.  No focal lesion. Sinuses/Orbits: Small mucous retention cyst or polyp left maxillary sinus noted. There is minimal mucosal thickening in the right maxillary sinus. Other: None. IMPRESSION: Right pontine infarct is age indeterminate. Left occipital lobe infarct appears more remote but also can not be definitively characterized. Atrophy and chronic microvascular ischemic change. Atherosclerosis. Electronically Signed   By: Inge Rise M.D.   On: 07/13/2018 11:27   Mr Jodene Nam Head Wo Contrast  Result Date: 07/13/2018 CLINICAL DATA:  83 y/o M; left leg weakness, slurred speech, left facial droop 07/08/2018. EXAM: MRI HEAD WITHOUT CONTRAST MRA HEAD WITHOUT CONTRAST TECHNIQUE: Multiplanar, multiecho pulse sequences of the brain and surrounding structures were obtained without intravenous contrast. Angiographic images of the head  were obtained using MRA technique without contrast. COMPARISON:  07/13/2018 CT head. FINDINGS: MRI HEAD FINDINGS Brain: Right paramedian pontine focus of reduced diffusion measuring 15 x 10 mm (AP by ML series 5, image 64) compatible with acute/early subacute infarction. Additional punctate focus of infarction within the left parietal cortex. No associated hemorrhage or mass effect. Several nonspecific T2 FLAIR hyperintensities in subcortical and periventricular white matter are compatible with mild chronic microvascular ischemic changes for age. Mild volume loss of the brain. No extra-axial collection, hydrocephalus, mass effect, or herniation. No abnormal susceptibility hypointensity to indicate intracranial hemorrhage. Vascular: Normal flow voids. Skull and upper cervical spine: Normal marrow signal. Sinuses/Orbits: Mild left maxillary sinus mucosal thickening. No additional abnormal signal of the visible paranasal sinuses and mastoid air cells. Bilateral intra-ocular lens replacement. Other: None. MRA HEAD FINDINGS Internal carotid arteries: Patent. Lumen irregularity of the internal carotid arteries compatible with atherosclerosis. Mild proximal left cavernous stenosis and mild-to-moderate right paraclinoid ICA stenosis. Anterior cerebral arteries: Patent. Large left A1 and anterior communicating artery, normal variant. Middle cerebral arteries: Patent. Mild non stenotic lumen irregularity of M1 segments compatible with atherosclerosis. Right M2 inferior division origin mild stenosis. Anterior communicating artery: Patent. Posterior communicating arteries:  Patent. Posterior cerebral arteries: Patent. Moderate left P1 stenosis. Mild distal right P2 stenosis. Basilar artery:  Patent.  Mild mid basilar stenosis. Vertebral arteries:  Patent. No aneurysm or large vessel occlusion. IMPRESSION: MRI head: 1. Right paramedian pons acute/early subacute infarction. Additional punctate infarction in left parietal cortex.  No hemorrhage or mass effect. 2. Mild for age chronic microvascular ischemic changes and volume loss of the brain. MRA head: 1. No large vessel occlusion or aneurysm identified. 2. Intracranial atherosclerosis with multiple segments of  mild-to-moderate stenosis in the anterior and posterior circulation. Electronically Signed   By: Kristine Garbe M.D.   On: 07/13/2018 13:45   Mr Brain Wo Contrast  Result Date: 07/13/2018 CLINICAL DATA:  83 y/o M; left leg weakness, slurred speech, left facial droop 07/08/2018. EXAM: MRI HEAD WITHOUT CONTRAST MRA HEAD WITHOUT CONTRAST TECHNIQUE: Multiplanar, multiecho pulse sequences of the brain and surrounding structures were obtained without intravenous contrast. Angiographic images of the head were obtained using MRA technique without contrast. COMPARISON:  07/13/2018 CT head. FINDINGS: MRI HEAD FINDINGS Brain: Right paramedian pontine focus of reduced diffusion measuring 15 x 10 mm (AP by ML series 5, image 64) compatible with acute/early subacute infarction. Additional punctate focus of infarction within the left parietal cortex. No associated hemorrhage or mass effect. Several nonspecific T2 FLAIR hyperintensities in subcortical and periventricular white matter are compatible with mild chronic microvascular ischemic changes for age. Mild volume loss of the brain. No extra-axial collection, hydrocephalus, mass effect, or herniation. No abnormal susceptibility hypointensity to indicate intracranial hemorrhage. Vascular: Normal flow voids. Skull and upper cervical spine: Normal marrow signal. Sinuses/Orbits: Mild left maxillary sinus mucosal thickening. No additional abnormal signal of the visible paranasal sinuses and mastoid air cells. Bilateral intra-ocular lens replacement. Other: None. MRA HEAD FINDINGS Internal carotid arteries: Patent. Lumen irregularity of the internal carotid arteries compatible with atherosclerosis. Mild proximal left cavernous stenosis  and mild-to-moderate right paraclinoid ICA stenosis. Anterior cerebral arteries: Patent. Large left A1 and anterior communicating artery, normal variant. Middle cerebral arteries: Patent. Mild non stenotic lumen irregularity of M1 segments compatible with atherosclerosis. Right M2 inferior division origin mild stenosis. Anterior communicating artery: Patent. Posterior communicating arteries:  Patent. Posterior cerebral arteries: Patent. Moderate left P1 stenosis. Mild distal right P2 stenosis. Basilar artery:  Patent.  Mild mid basilar stenosis. Vertebral arteries:  Patent. No aneurysm or large vessel occlusion. IMPRESSION: MRI head: 1. Right paramedian pons acute/early subacute infarction. Additional punctate infarction in left parietal cortex. No hemorrhage or mass effect. 2. Mild for age chronic microvascular ischemic changes and volume loss of the brain. MRA head: 1. No large vessel occlusion or aneurysm identified. 2. Intracranial atherosclerosis with multiple segments of mild-to-moderate stenosis in the anterior and posterior circulation. Electronically Signed   By: Kristine Garbe M.D.   On: 07/13/2018 13:45   Vas US Carotid (at [place] Only)  Result Date: 07/14/2018 Carotid Arterial Duplex Study Indications: CVA. Performing Technologist: Abram Sander RVS  Examination Guidelines: A complete evaluation includes B-mode imaging, spectral Doppler, color Doppler, and power Doppler as needed of all accessible portions of each vessel. Bilateral testing is considered an integral part of a complete examination. Limited examinations for reoccurring indications may be performed as noted.  Right Carotid Findings: +----------+--------+--------+--------+------------+--------+           PSV cm/sEDV cm/sStenosisDescribe    Comments +----------+--------+--------+--------+------------+--------+ CCA Prox  85      7               heterogenous          +----------+--------+--------+--------+------------+--------+ CCA Distal94      12              heterogenous         +----------+--------+--------+--------+------------+--------+ ICA Prox  77      13      1-39%   heterogenous         +----------+--------+--------+--------+------------+--------+ ICA Distal75      14                                   +----------+--------+--------+--------+------------+--------+  ECA       381                                          +----------+--------+--------+--------+------------+--------+ +----------+--------+-------+--------+-------------------+           PSV cm/sEDV cmsDescribeArm Pressure (mmHG) +----------+--------+-------+--------+-------------------+ Subclavian170                                        +----------+--------+-------+--------+-------------------+ +---------+--------+--+--------+--+---------+ VertebralPSV cm/s39EDV cm/s10Antegrade +---------+--------+--+--------+--+---------+  Left Carotid Findings: +----------+--------+--------+--------+------------+--------+           PSV cm/sEDV cm/sStenosisDescribe    Comments +----------+--------+--------+--------+------------+--------+ CCA Prox  113     12              heterogenous         +----------+--------+--------+--------+------------+--------+ CCA Distal105     14              heterogenous         +----------+--------+--------+--------+------------+--------+ ICA Prox  89      30      1-39%   heterogenous         +----------+--------+--------+--------+------------+--------+ ICA Distal96      27                                   +----------+--------+--------+--------+------------+--------+ ECA       494                                          +----------+--------+--------+--------+------------+--------+ +----------+--------+--------+--------+-------------------+ SubclavianPSV cm/sEDV cm/sDescribeArm Pressure (mmHG)  +----------+--------+--------+--------+-------------------+           185                                         +----------+--------+--------+--------+-------------------+ +---------+--------+--+--------+-+---------+ VertebralPSV cm/s39EDV cm/s6Antegrade +---------+--------+--+--------+-+---------+  Summary: Right Carotid: Velocities in the right ICA are consistent with a 1-39% stenosis.                The ECA appears >50% stenosed. Left Carotid: Velocities in the left ICA are consistent with a 1-39% stenosis.               The ECA appears >50% stenosed. Vertebrals: Bilateral vertebral arteries demonstrate antegrade flow. *See table(s) above for measurements and observations.     Preliminary     Assessment and Plan:   1. Stroke, suspected embolic (with prior h/o post-op atrial fib 2017, but no overt afib on telemetry) - I will review recommendations with Dr. Radford Pax. The patient/daughter report he was told he had an episode of atrial fib during his echo. The report lists diastolic function out, which is typically not able to be assessed accurately when out of rhythm. I will ask Dr. Radford Pax to review the images' telemetry strips (I do not have access). Inpatient telemetry has shown NSR with occ PACs/brief atrial tach (4 beats), but no overt atrial fib. That being said, he certainly could have had asymptomatic persistent atrial fib as an outpatient that we have just not been as fortunate to detect. He has a  high CHADSVASC score therefore Eliquis seems reasonable but I will review final plans with Dr. Radford Pax. We will also need to weigh in on continuation of ASA.  2. CAD s/p CABG 2017 without angina - will discuss need for ASA with MD.  3. HTN - management per neuro team.  4. HLD - pending [place]atha as OP.  For questions or updates, please contact [place] Please consult [email or web address] for contact info under Cardiology/STEMI.    Signed, Charlie Pitter, PA-C  07/14/2018 3:35 PM

## 2018-07-14 NOTE — Progress Notes (Signed)
PROGRESS NOTE    Andre Wolfe  IRJ:[id number] DOB: 20-Oct-1934 DOA: 07/13/2018 PCP: [name], MD   Brief Narrative:  HPI per Dr. Karmen Bongo Andre Wolfe is a 83 y.o. male with medical history significant of HTN; HLD; CAD s/p CABG; prostate CA; post-op afib; and DM presenting with left leg weakness.  On Christmas Day, his family noticed a change with walking, talking, weakness, increased falls.  He was using a walker recently but lately he can't walk at the grocery or [place].  Now he just tires too easily.  Left facial droop.  +slurred speech.  No dysphagia - although his wife reported some coughing with eating; he does not think that is an issue.  He has not noticed weakness of one arm or leg - he was dragging his left leg last night and he couldn't tell.  ED Course:   Left-sided weakness since Christmas Day.  Appears to have a completed R pontine stroke.  MRI pending.  Neurology consulted.  **Still being worked up.  Neurology recommending placing the patient back on Eliquis.  Cardiology consult still pending. OT recommending CIR.  Assessment & Plan:   Active Problems:   Right pontine CVA (HCC)  Acute Right Pontine CVA as well as small Cortical Infarct -In the Setting of Atrial Fibrillation and felt to be Embolic  -Admitted to Stroke Floor -Neurology Stroke Team Following and appreciate Further Recommendations and evaluation -Patient presenting with left leg weakness and left facial droop starting on 12/25 - concerning for completed CVA -CT/MRI confirms acute/subacute pontine CVA as well as left parietal cortex infarct -Will admit for further CVA evaluation -C/w Telemetry monitoring -Preliminiary Carotid dopplers showed -ECHOCardiogram showed EF of 60-65% and Features are  consistent with a pseudonormal left ventricular filling pattern,  with concomitant abnormal relaxation and increased filling   pressure (grade 2 diastolic dysfunction). Acoustic contrast   opacification  revealed no evidence of thrombus. The Ventricular Septum showed Septal motion showed paradox. - Mitral valve: Calcified annulus. Valve area by pressure   half-time: 2.32 cm^2. -Placed on Anticoagulation with Heparin gtt and will defer to Cardiology for NOAC but Neurology recommending continuation of Apixaban Daily  -Risk stratification with FLP, A1c as below  -UDS was Negative and TSH was 2.482 -ASA 81 mg daily -Neurology consulted for further recommendations  -PT/OT/ST/Nutrition Consults -Rehab consult for CIR placement; Dr. Delice Lesch stating await completion of Therapy Evalus and will consider CIR if patient is far from baseline and able to tolerate 3 hours of Therapy per day after completion of medical workup  Paroxysmal Atrial Fibrillation -Likely the source of Stroke -Patient was seen by Dr. Martinique in 9/19 and taken off Eliquis -At the time, the patient had only had 1 episode of short-term afib which was post-operative in nature and so d/c of AC was reasonable -However, he appears to have PAF given his multiple infarcts and would likely benefit from [facility] -For now, will start heparin per pharmacy and Neurology recommending  -Cardiology consultation is pending   HTN -Allow permissive HTNfor now -Treat BP only if >220/120, and then with goal of 15% reduction -HoldARB, CCB, BBand plan to restart in 48-72 hours  HLD -He has had difficulty with statins in the past (myalgias) -I do not see a record of lipid testing in [place]; however, his last note from Dr. Martinique reports LDL decreased from 141 to 112 on Zetia but his goal is <70 and so he was referred to PharmD  for PCSK 9 inhibitor (Repatha); he has not yet started this medication -Panel done and showed total cholesterol ratio of 7.0, cholesterol level of 211, HDL of 30, LDL of 153, triglycerides of 139, VLDL of 28 -Pravastatin would be another consideration since its hydrophilic properties may make it better  tolerated -Will defer to Neurology/Cardiology to start   DM -Checked A1c and was 8.9 -Continue with Home 70/30 Novolog Mix 5 units sq Daily at 1200 and 25 units sq BID w/ Meals at 1700 -C/w Moderate-scale SSI -CBG's ranging from 177-233  Obesity -Estimated body mass index is 31.31 kg/m as calculated from the following:   Height as of this encounter: 5\' 9"  (1.753 m).   Weight as of this encounter: 96.2 kg. -Weight Loss Counseling Given   CAD s/p CABG -C/w ASA 81 mg po Daily  -Cardiology to formally consult and awaiting their evaluation  GERD -C/w Pantoprazole 40 mg po Daily   DVT prophylaxis: Anticoagulated with Heparin gtt Code Status: DO NOT RESUSICTATE Family Communication: Discussed with family present at bedside Disposition Plan: CIR when cleared by Cardiology and Neurology  Consultants:   Neurology  Cardiology   Procedures:  ECHOCARDIOGRAM ------------------------------------------------------------------- Study Conclusions  - Left ventricle: The cavity size was normal. Wall thickness was   normal. Systolic function was normal. The estimated ejection   fraction was in the range of 60% to 65%. Wall motion was normal;   there were no regional wall motion abnormalities. Features are   consistent with a pseudonormal left ventricular filling pattern,   with concomitant abnormal relaxation and increased filling   pressure (grade 2 diastolic dysfunction). Acoustic contrast   opacification revealed no evidence ofthrombus. - Ventricular septum: Septal motion showed paradox. - Mitral valve: Calcified annulus. Valve area by pressure   half-time: 2.32 cm^2. - Left atrium: The atrium was mildly dilated.  VASCULAR US CAROTID Preliminary Results showed: Summary: Right Carotid: Velocities in the right ICA are consistent with a 1-39% stenosis.                The ECA appears >50% stenosed.  Left Carotid: Velocities in the left ICA are consistent with a 1-39%  stenosis.               The ECA appears >50% stenosed.  Vertebrals: Bilateral vertebral arteries demonstrate antegrade flow.   Antimicrobials:  Anti-infectives (From admission, onward)   None     Subjective: At bedside and sitting in the chair.  Denying chest pain, lightheadedness or dizziness.  States that he still feels weak and his main complaint was that he was still unable to get out of bed.  No nausea or vomiting.  No other concerns or complaints at this time.  Objective: Vitals:   07/13/18 1730 07/13/18 2006 07/13/18 2337 07/14/18 0333  BP: (!) 157/81 (!) 112/58 (!) 119/51 (!) 128/59  Pulse: (!) 54 (!) 54 (!) 55 (!) 51  Resp:  18 18 18   Temp: 98.2 F (36.8 C) 98.1 F (36.7 C) 98.4 F (36.9 C) 98.3 F (36.8 C)  TempSrc: Oral Oral Oral Oral  SpO2: 98% 100% 100% 98%  Weight:      Height:        Intake/Output Summary (Last 24 hours) at 07/14/2018 0734 Last data filed at 07/13/2018 1825 Gross per 24 hour  Intake 0.56 ml  Output 200 ml  Net -199.44 ml   Filed Weights   07/13/18 0932  Weight: 96.2 kg   Examination: Physical Exam:  Constitutional: WN/WD  obese Caucasian male in NAD and appears calm sitting in chair bedside Eyes: Lids and conjunctivae normal, sclerae anicteric  ENMT: External Ears, Nose appear normal. Grossly normal hearing. Mucous membranes are moist.  Neck: Appears normal, supple, no cervical masses, normal ROM, no appreciable thyromegaly; no JVD Respiratory: Diminished to auscultation bilaterally, no wheezing, rales, rhonchi or crackles. Normal respiratory effort and patient is not tachypenic. No accessory muscle use.  Cardiovascular: RRR, no murmurs / rubs / gallops. S1 and S2 auscultated. No extremity edema.  Abdomen: Soft, non-tender, Distended due to body habitus. No masses palpated. No appreciable hepatosplenomegaly. Bowel sounds positive x4.  GU: Deferred. Musculoskeletal: No clubbing / cyanosis of digits/nails. No joint deformity upper and  lower extremities.  Skin: No rashes, lesions, ulcers on a limited skin evaluation. No induration; Warm and dry.  Neurologic: CN 2-12 grossly intact with no focal deficits. Sensation intact in all 4 Extremities, DTR normal. Diminished Strength 4/5 in Left sided extremities. Romberg sign and cerebellar reflexes not assessed.  Psychiatric: Normal judgment and insight. Alert and oriented x 3. Normal mood and appropriate affect.   Data Reviewed: I have personally reviewed following labs and imaging studies  CBC: Recent Labs  Lab 07/13/18 1000 07/14/18 0013  WBC 7.5 6.7  HGB 16.0 14.7  HCT 46.9 43.4  MCV 88.0 85.9  PLT 225 185   Basic Metabolic Panel: Recent Labs  Lab 07/13/18 1000  NA 138  K 4.2  CL 103  CO2 25  GLUCOSE 281*  BUN 12  CREATININE 1.03  CALCIUM 9.1   GFR: Estimated Creatinine Clearance: 64.4 mL/min (by C-G formula based on SCr of 1.03 mg/dL). Liver Function Tests: No results for input(s): AST, ALT, ALKPHOS, BILITOT, PROT, ALBUMIN in the last 168 hours. No results for input(s): LIPASE, AMYLASE in the last 168 hours. No results for input(s): AMMONIA in the last 168 hours. Coagulation Profile: Recent Labs  Lab 07/13/18 1000  INR 1.12   Cardiac Enzymes: No results for input(s): CKTOTAL, CKMB, CKMBINDEX, TROPONINI in the last 168 hours. BNP (last 3 results) No results for input(s): PROBNP in the last 8760 hours. HbA1C: Recent Labs    07/14/18 0013  HGBA1C 8.9*   CBG: Recent Labs  Lab 07/13/18 0942 07/13/18 1635 07/13/18 2107 07/14/18 0633  GLUCAP 279* 177* 233* 209*   Lipid Profile: Recent Labs    07/14/18 0013  CHOL 211*  HDL 30*  LDLCALC 153*  TRIG 139  CHOLHDL 7.0   Thyroid Function Tests: Recent Labs    07/14/18 0013  TSH 2.482   Anemia Panel: No results for input(s): VITAMINB12, FOLATE, FERRITIN, TIBC, IRON, RETICCTPCT in the last 72 hours. Sepsis Labs: No results for input(s): PROCALCITON, LATICACIDVEN in the last 168  hours.  No results found for this or any previous visit (from the past 240 hour(s)).   Radiology Studies: Ct Head Wo Contrast  Result Date: 07/13/2018 CLINICAL DATA:  Left leg weakness, left facial droop and slurred speech since 07/08/2018. EXAM: CT HEAD WITHOUT CONTRAST TECHNIQUE: Contiguous axial images were obtained from the base of the skull through the vertex without intravenous contrast. COMPARISON:  None. FINDINGS: Brain: The patient has an age indeterminate infarct in the right pons. Left occipital lobe infarct appears more remote but can not be definitively characterized. No mass, hemorrhage, midline shift or abnormal extra-axial fluid collection is identified. No hydrocephalus or pneumocephalus. Vascular: Atherosclerosis is noted. Skull: Intact.  No focal lesion. Sinuses/Orbits: Small mucous retention cyst or polyp left maxillary sinus noted.  There is minimal mucosal thickening in the right maxillary sinus. Other: None. IMPRESSION: Right pontine infarct is age indeterminate. Left occipital lobe infarct appears more remote but also can not be definitively characterized. Atrophy and chronic microvascular ischemic change. Atherosclerosis. Electronically Signed   By: Inge Rise M.D.   On: 07/13/2018 11:27   Mr Jodene Nam Head Wo Contrast  Result Date: 07/13/2018 CLINICAL DATA:  83 y/o M; left leg weakness, slurred speech, left facial droop 07/08/2018. EXAM: MRI HEAD WITHOUT CONTRAST MRA HEAD WITHOUT CONTRAST TECHNIQUE: Multiplanar, multiecho pulse sequences of the brain and surrounding structures were obtained without intravenous contrast. Angiographic images of the head were obtained using MRA technique without contrast. COMPARISON:  07/13/2018 CT head. FINDINGS: MRI HEAD FINDINGS Brain: Right paramedian pontine focus of reduced diffusion measuring 15 x 10 mm (AP by ML series 5, image 64) compatible with acute/early subacute infarction. Additional punctate focus of infarction within the left  parietal cortex. No associated hemorrhage or mass effect. Several nonspecific T2 FLAIR hyperintensities in subcortical and periventricular white matter are compatible with mild chronic microvascular ischemic changes for age. Mild volume loss of the brain. No extra-axial collection, hydrocephalus, mass effect, or herniation. No abnormal susceptibility hypointensity to indicate intracranial hemorrhage. Vascular: Normal flow voids. Skull and upper cervical spine: Normal marrow signal. Sinuses/Orbits: Mild left maxillary sinus mucosal thickening. No additional abnormal signal of the visible paranasal sinuses and mastoid air cells. Bilateral intra-ocular lens replacement. Other: None. MRA HEAD FINDINGS Internal carotid arteries: Patent. Lumen irregularity of the internal carotid arteries compatible with atherosclerosis. Mild proximal left cavernous stenosis and mild-to-moderate right paraclinoid ICA stenosis. Anterior cerebral arteries: Patent. Large left A1 and anterior communicating artery, normal variant. Middle cerebral arteries: Patent. Mild non stenotic lumen irregularity of M1 segments compatible with atherosclerosis. Right M2 inferior division origin mild stenosis. Anterior communicating artery: Patent. Posterior communicating arteries:  Patent. Posterior cerebral arteries: Patent. Moderate left P1 stenosis. Mild distal right P2 stenosis. Basilar artery:  Patent.  Mild mid basilar stenosis. Vertebral arteries:  Patent. No aneurysm or large vessel occlusion. IMPRESSION: MRI head: 1. Right paramedian pons acute/early subacute infarction. Additional punctate infarction in left parietal cortex. No hemorrhage or mass effect. 2. Mild for age chronic microvascular ischemic changes and volume loss of the brain. MRA head: 1. No large vessel occlusion or aneurysm identified. 2. Intracranial atherosclerosis with multiple segments of mild-to-moderate stenosis in the anterior and posterior circulation. Electronically Signed    By: Kristine Garbe M.D.   On: 07/13/2018 13:45   Mr Brain Wo Contrast  Result Date: 07/13/2018 CLINICAL DATA:  83 y/o M; left leg weakness, slurred speech, left facial droop 07/08/2018. EXAM: MRI HEAD WITHOUT CONTRAST MRA HEAD WITHOUT CONTRAST TECHNIQUE: Multiplanar, multiecho pulse sequences of the brain and surrounding structures were obtained without intravenous contrast. Angiographic images of the head were obtained using MRA technique without contrast. COMPARISON:  07/13/2018 CT head. FINDINGS: MRI HEAD FINDINGS Brain: Right paramedian pontine focus of reduced diffusion measuring 15 x 10 mm (AP by ML series 5, image 64) compatible with acute/early subacute infarction. Additional punctate focus of infarction within the left parietal cortex. No associated hemorrhage or mass effect. Several nonspecific T2 FLAIR hyperintensities in subcortical and periventricular white matter are compatible with mild chronic microvascular ischemic changes for age. Mild volume loss of the brain. No extra-axial collection, hydrocephalus, mass effect, or herniation. No abnormal susceptibility hypointensity to indicate intracranial hemorrhage. Vascular: Normal flow voids. Skull and upper cervical spine: Normal marrow signal. Sinuses/Orbits: Mild left maxillary  sinus mucosal thickening. No additional abnormal signal of the visible paranasal sinuses and mastoid air cells. Bilateral intra-ocular lens replacement. Other: None. MRA HEAD FINDINGS Internal carotid arteries: Patent. Lumen irregularity of the internal carotid arteries compatible with atherosclerosis. Mild proximal left cavernous stenosis and mild-to-moderate right paraclinoid ICA stenosis. Anterior cerebral arteries: Patent. Large left A1 and anterior communicating artery, normal variant. Middle cerebral arteries: Patent. Mild non stenotic lumen irregularity of M1 segments compatible with atherosclerosis. Right M2 inferior division origin mild stenosis. Anterior  communicating artery: Patent. Posterior communicating arteries:  Patent. Posterior cerebral arteries: Patent. Moderate left P1 stenosis. Mild distal right P2 stenosis. Basilar artery:  Patent.  Mild mid basilar stenosis. Vertebral arteries:  Patent. No aneurysm or large vessel occlusion. IMPRESSION: MRI head: 1. Right paramedian pons acute/early subacute infarction. Additional punctate infarction in left parietal cortex. No hemorrhage or mass effect. 2. Mild for age chronic microvascular ischemic changes and volume loss of the brain. MRA head: 1. No large vessel occlusion or aneurysm identified. 2. Intracranial atherosclerosis with multiple segments of mild-to-moderate stenosis in the anterior and posterior circulation. Electronically Signed   By: Kristine Garbe M.D.   On: 07/13/2018 13:45   Scheduled Meds: . aspirin EC  81 mg Oral Daily  . gabapentin  600 mg Oral QHS  . insulin aspart  0-15 Units Subcutaneous TID WC  . insulin aspart  0-5 Units Subcutaneous QHS  . insulin aspart protamine- aspart  25 Units Subcutaneous BID WC  . insulin aspart protamine- aspart  5 Units Subcutaneous Q1200  . pantoprazole  40 mg Oral Daily   Continuous Infusions: . sodium chloride 50 mL/hr at 07/13/18 1744  . heparin 1,300 Units/hr (07/13/18 1747)    LOS: 1 day   Kerney Elbe, DO [facility] PAGER is on AMION  If 7PM-7AM, please contact night-coverage [email or web address] Password [facility] 07/14/2018, 7:34 AM

## 2018-07-14 NOTE — Progress Notes (Addendum)
Nutrition Brief Note   RD consulted to assess patient's nutrition status.   Wt Readings from Last 3 Encounters:  07/13/18 96.2 kg  03/26/18 95 kg  11/27/17 96.6 kg   Body mass index is 31.31 kg/m. Patient meets criteria for obesity unspecified based on current BMI.   Current diet order is heart healthy/ carb modified with thins. Pt reported having a good appetite and eating 100% of his meals while in the hospital. Pt stated eating 3 meals a day at home. UBW around 212# with no recent weight loss. Labs and medications reviewed.   No nutrition interventions warranted at this time. If nutrition issues arise, please consult RD.  Mauricia Area, MS, Dietetic Intern Pager: [phone] After hours Pager: [phone]

## 2018-07-14 NOTE — Progress Notes (Signed)
Carotid duplex has been completed.   Preliminary results in CV Proc.   Andre Wolfe 07/14/2018 9:35 AM

## 2018-07-14 NOTE — Progress Notes (Signed)
ANTICOAGULATION CONSULT NOTE - Follow Up Consult  Pharmacy Consult for heparin Indication: new Afib in setting of CVA  Labs: Recent Labs    07/13/18 1000 07/14/18 0013  HGB 16.0 14.7  HCT 46.9 43.4  PLT 225 212  APTT 26  --   LABPROT 14.3  --   INR 1.12  --   HEPARINUNFRC  --  0.32  CREATININE 1.03  --     Assessment/Plan:  83yo male therapeutic on heparin with initial dosing for Afib/CVA. Will continue gtt at current rate and confirm stable with additional level.   Wynona Neat, PharmD, BCPS  07/14/2018,12:53 AM

## 2018-07-14 NOTE — Progress Notes (Signed)
  Echocardiogram 2D Echocardiogram has been performed.  Andre Wolfe 07/14/2018, 9:46 AM

## 2018-07-15 LAB — CBC
HCT: 46 % (ref 39.0–52.0)
HEMOGLOBIN: 16.1 g/dL (ref 13.0–17.0)
MCH: 30.1 pg (ref 26.0–34.0)
MCHC: 35 g/dL (ref 30.0–36.0)
MCV: 86 fL (ref 80.0–100.0)
Platelets: 211 10*3/uL (ref 150–400)
RBC: 5.35 MIL/uL (ref 4.22–5.81)
RDW: 12.3 % (ref 11.5–15.5)
WBC: 6.6 10*3/uL (ref 4.0–10.5)
nRBC: 0 % (ref 0.0–0.2)

## 2018-07-15 LAB — HEPARIN LEVEL (UNFRACTIONATED): Heparin Unfractionated: 0.66 IU/mL (ref 0.30–0.70)

## 2018-07-15 LAB — MAGNESIUM: Magnesium: 1.6 mg/dL — ABNORMAL LOW (ref 1.7–2.4)

## 2018-07-15 LAB — GLUCOSE, CAPILLARY
Glucose-Capillary: 191 mg/dL — ABNORMAL HIGH (ref 70–99)
Glucose-Capillary: 201 mg/dL — ABNORMAL HIGH (ref 70–99)
Glucose-Capillary: 135 mg/dL — ABNORMAL HIGH (ref 70–99)
Glucose-Capillary: 138 mg/dL — ABNORMAL HIGH (ref 70–99)

## 2018-07-15 LAB — COMPREHENSIVE METABOLIC PANEL
ALT: 22 U/L (ref 0–44)
AST: 17 U/L (ref 15–41)
Albumin: 3.3 g/dL — ABNORMAL LOW (ref 3.5–5.0)
Alkaline Phosphatase: 41 U/L (ref 38–126)
Anion gap: 11 (ref 5–15)
BILIRUBIN TOTAL: 1.1 mg/dL (ref 0.3–1.2)
BUN: 9 mg/dL (ref 8–23)
CO2: 22 mmol/L (ref 22–32)
CREATININE: 0.99 mg/dL (ref 0.61–1.24)
Calcium: 9 mg/dL (ref 8.9–10.3)
Chloride: 106 mmol/L (ref 98–111)
GFR calc Af Amer: >60 mL/min (ref >60)
GFR calc non Af Amer: >60 mL/min (ref >60)
Glucose, Bld: 204 mg/dL — ABNORMAL HIGH (ref 70–99)
Potassium: 3.8 mmol/L (ref 3.5–5.1)
Sodium: 139 mmol/L (ref 135–145)
Total Protein: 6.6 g/dL (ref 6.5–8.1)

## 2018-07-15 LAB — PHOSPHORUS: Phosphorus: 3.5 mg/dL (ref 2.5–4.6)

## 2018-07-15 MED ORDER — PHENOL 1.4 % MT LIQD
1.0000 | OROMUCOSAL | Status: DC | PRN
  Filled 2018-07-15: qty 177

## 2018-07-15 MED ORDER — METOPROLOL TARTRATE 50 MG PO TABS
50.0000 mg | ORAL_TABLET | Freq: Two times a day (BID) | ORAL | Status: DC
  Administered 2018-07-15 – 2018-07-16 (×3): 50 mg via ORAL
  Filled 2018-07-15 (×4): qty 1

## 2018-07-15 NOTE — Progress Notes (Signed)
PROGRESS NOTE    Andre Wolfe  IRS:[id number] DOB: 04-14-1935 DOA: 07/13/2018 PCP: [name], MD   Brief Narrative:  HPI per Dr. Karmen Bongo Andre Wolfe is a 84 y.o. male with medical history significant of HTN; HLD; CAD s/p CABG; prostate CA; post-op afib; and DM presenting with left leg weakness.  On Christmas Day, his family noticed a change with walking, talking, weakness, increased falls.  He was using a walker recently but lately he can't walk at the grocery or [place].  Now he just tires too easily.  Left facial droop.  +slurred speech.  No dysphagia - although his wife reported some coughing with eating; he does not think that is an issue.  He has not noticed weakness of one arm or leg - he was dragging his left leg last night and he couldn't tell.  ED Course:   Left-sided weakness since Christmas Day.  Appears to have a completed R pontine stroke.  MRI pending.  Neurology consulted.  **Still being worked up.  Neurology recommending placing the patient back on Eliquis and Cardiology agreeing.  Since the patient actually has atrial fibrillation there is no indication for TEE and loop recorder and tomorrow's TEE will be canceled.  Dr. Martinique recommending watching the patient today and likely discharge to [facility] tomorrow if heart rate is well controlled.  Assessment & Plan:   Active Problems:   Right pontine CVA (HCC)   Acute ischemic stroke [facility])   Essential hypertension   Poorly controlled type 2 diabetes mellitus with peripheral neuropathy (HCC)   Tobacco abuse   PAF (paroxysmal atrial fibrillation) (HCC)  Acute Right Pontine CVA as well as small Cortical Infarct -In the Setting of Atrial Fibrillation and felt to be Embolic  -Admitted to Stroke Floor -Neurology Stroke Team Following and appreciate Further Recommendations and evaluation -Patient presenting with left leg weakness and left facial droop starting on 12/25 - concerning for completed CVA -CT/MRI confirms  acute/subacute pontine CVA as well as left parietal cortex infarct -Admitted for further CVA evaluation -C/w Telemetry monitoring -Preliminiary Carotid dopplers showed Right Carotid: Velocities in the right ICA are consistent with a 1-39% stenosis. The ECA appears >50% stenosed. Left Carotid: Velocities in the left ICA are consistent with a 1-39% stenosis. The ECA appears >50% stenosed. Vertebrals: Bilateral vertebral arteries demonstrate antegrade flow. -ECHOCardiogram showed EF of 60-65% and Features are  consistent with a pseudonormal left ventricular filling pattern,  with concomitant abnormal relaxation and increased filling   pressure (grade 2 diastolic dysfunction). Acoustic contrast   opacification revealed no evidence of thrombus. The Ventricular Septum showed Septal motion showed paradox. - Mitral valve: Calcified annulus. Valve area by pressure   half-time: 2.32 cm^2. -Placed on Anticoagulation with Heparin gtt and transitioned to Apixaban  -Risk stratification with FLP, A1c as below  -UDS was Negative and TSH was 2.482 -No Indication for TEE and Loop Recorder now -ASA 81 mg daily now stopped by Cardiology  -Neurology consulted for further recommendations  -PT/OT/ST/Nutrition Consults -Rehab consult for CIR placement; Dr. Delice Lesch stating await completion of Therapy Evalus and will consider CIR if patient is far from baseline and able to tolerate 3 hours of Therapy per day after completion of medical workup -PT/OT recommending CIR with Rolling Walker with 5" Wheels  Paroxysmal Atrial Fibrillation with now A Fib with RVR -Likely the source of Stroke -Patient was seen by Dr. Martinique in 9/19 and taken off Eliquis but this is resumed -At the time,  the patient had only had 1 episode of short-term afib which was post-operative in nature and so d/c of AC was reasonable -However, he appears to have PAF given his multiple infarcts and would likely benefit from [facility] -Heparin changed to  Eliquis 5 mg po BID and Metoprolol started for Rate Control -Cardiology evaluated and recommending observing tonight and if Rate controlled can be D/C'd to CIR in AM   HTN -Allow permissive HTNfor now -Treat BP only if >220/120, and then with goal of 15% reduction -HoldARB, CCB, BBand plan to restart in 48-72 hours and gradually normalize -Long Term BP goal is Normotensive   HLD -He has had difficulty with statins in the past (myalgias) -I do not see a record of lipid testing in Epic including Care Everywhere; however, his last note from Dr. Martinique reports LDL decreased from 141 to 112 on Zetia but his goal is <70 and so he was referred to PharmD for PCSK 9 inhibitor (Repatha); he has not yet started this medication -Panel done and showed total cholesterol ratio of 7.0, cholesterol level of 211, HDL of 30, LDL of 153, triglycerides of 139, VLDL of 28 -Pravastatin would be another consideration since its hydrophilic properties may make it better tolerated -Will defer to Neurology/Cardiology to start   DM -Checked A1c and was 8.9 -Continue with Home 70/30 Novolog Mix 5 units sq Daily at 1200 and 25 units sq BID w/ Meals at 1700 -C/w Moderate-scale SSI -CBG's ranging from 165-201  Obesity -Estimated body mass index is 31.31 kg/m as calculated from the following:   Height as of this encounter: 5\' 9"  (1.753 m).   Weight as of this encounter: 96.2 kg. -Weight Loss Counseling Given   CAD s/p CABG -CABG done in 2017 -ASA 81 mg po Daily discontinued by Cardiology -Currently stable and no Angina   GERD -C/w Pantoprazole 40 mg po Daily   DVT prophylaxis: Anticoagulated with Heparin gtt Code Status: DO NOT RESUSICTATE Family Communication: Discussed with family present at bedside Disposition Plan: CIR when cleared by Cardiology and Neurology; Likely in AM   Consultants:   Neurology  Cardiology   Procedures:   ECHOCARDIOGRAM ------------------------------------------------------------------- Study Conclusions  - Left ventricle: The cavity size was normal. Wall thickness was   normal. Systolic function was normal. The estimated ejection   fraction was in the range of 60% to 65%. Wall motion was normal;   there were no regional wall motion abnormalities. Features are   consistent with a pseudonormal left ventricular filling pattern,   with concomitant abnormal relaxation and increased filling   pressure (grade 2 diastolic dysfunction). Acoustic contrast   opacification revealed no evidence ofthrombus. - Ventricular septum: Septal motion showed paradox. - Mitral valve: Calcified annulus. Valve area by pressure   half-time: 2.32 cm^2. - Left atrium: The atrium was mildly dilated.  VASCULAR US CAROTID Preliminary Results showed: Summary: Right Carotid: Velocities in the right ICA are consistent with a 1-39% stenosis.                The ECA appears >50% stenosed.  Left Carotid: Velocities in the left ICA are consistent with a 1-39% stenosis.               The ECA appears >50% stenosed.  Vertebrals: Bilateral vertebral arteries demonstrate antegrade flow.   Antimicrobials:  Anti-infectives (From admission, onward)   None     Subjective: Seen and examined at bedside and had no complaints.  Felt okay.  Denying chest pain,  lightheadedness or dizziness.  Today he went into A. fib with RVR and this morning he had rates in the 130s.  When I examined him his heart rate was slower but is still very irregular.  He denies feeling any fluttering in his chest.  No other concerns or complaints at this time and daughter updated about canceling TEE and loop recorder because now that he is in A. Fib.  Objective: Vitals:   07/14/18 2015 07/15/18 0423 07/15/18 0800 07/15/18 1158  BP: (!) 129/96 (!) 141/90 (!) 142/82 104/65  Pulse: (!) 110 87 82 (!) 57  Resp: (!) 22  18 16   Temp: 98.8 F (37.1 C) 98  F (36.7 C) 98.1 F (36.7 C) 98.3 F (36.8 C)  TempSrc: Oral  Oral   SpO2: 97% 96% 97% 97%  Weight:      Height:        Intake/Output Summary (Last 24 hours) at 07/15/2018 1357 Last data filed at 07/15/2018 1238 Gross per 24 hour  Intake 650.45 ml  Output 450 ml  Net 200.45 ml   Filed Weights   07/13/18 0932  Weight: 96.2 kg   Examination: Physical Exam:  Constitutional: Well-nourished, well-developed obese Caucasian male currently in no acute distress is appearing, sitting in chair bedside eating breakfast Eyes: Lids and conjunctive normal.  Sclera anicteric ENMT: External ears and nose appear normal. Neck: Appears supple no JVD Respiratory: Diminished to auscultation all bilaterally with no appreciable wheezing, rales, rhonchi.  Patient is not tachypneic wheezing and accessory muscles to breathe and has unlabored breathing Cardiovascular: Irregularly irregular slightly by bradycardic.  No appreciable murmur rubs or gallops.  Trace lower extremity edema Abdomen: Soft, nontender, distended second body habitus.  Bowel sounds present GU: Deferred Musculoskeletal: No contractures or cyanosis.  No joint deformity noted Skin: No appreciable rashes or lesions on limited skin evaluation Neurologic: Cranial nerves II through XII grossly intact no appreciable deficits.  Sensation appears grossly intact but he does have diminished strength in the upper left side extremity. Psychiatric: Normal judgment and insight.  Patient is awake and alert and oriented x3.  Has a normal mood and affect  Data Reviewed: I have personally reviewed following labs and imaging studies  CBC: Recent Labs  Lab 07/13/18 1000 07/14/18 0013 07/15/18 0439  WBC 7.5 6.7 6.6  HGB 16.0 14.7 16.1  HCT 46.9 43.4 46.0  MCV 88.0 85.9 86.0  PLT 225 212 174   Basic Metabolic Panel: Recent Labs  Lab 07/13/18 1000 07/14/18 1445 07/15/18 0742  NA 138 138 139  K 4.2 3.5 3.8  CL 103 103 106  CO2 25 21* 22   GLUCOSE 281* 158* 204*  BUN 12 15 9   CREATININE 1.03 0.94 0.99  CALCIUM 9.1 8.9 9.0  MG  --  1.7 1.6*  PHOS  --  3.7 3.5   GFR: Estimated Creatinine Clearance: 67 mL/min (by C-G formula based on SCr of 0.99 mg/dL). Liver Function Tests: Recent Labs  Lab 07/14/18 1445 07/15/18 0742  AST 18 17  ALT 23 22  ALKPHOS 38 41  BILITOT 0.7 1.1  PROT 6.6 6.6  ALBUMIN 3.5 3.3*   No results for input(s): LIPASE, AMYLASE in the last 168 hours. No results for input(s): AMMONIA in the last 168 hours. Coagulation Profile: Recent Labs  Lab 07/13/18 1000  INR 1.12   Cardiac Enzymes: No results for input(s): CKTOTAL, CKMB, CKMBINDEX, TROPONINI in the last 168 hours. BNP (last 3 results) No results for input(s): PROBNP in  the last 8760 hours. HbA1C: Recent Labs    07/14/18 0013  HGBA1C 8.9*   CBG: Recent Labs  Lab 07/14/18 1111 07/14/18 1640 07/14/18 2107 07/15/18 0633 07/15/18 1114  GLUCAP 194* 170* 165* 191* 201*   Lipid Profile: Recent Labs    07/14/18 0013  CHOL 211*  HDL 30*  LDLCALC 153*  TRIG 139  CHOLHDL 7.0   Thyroid Function Tests: Recent Labs    07/14/18 0013  TSH 2.482   Anemia Panel: No results for input(s): VITAMINB12, FOLATE, FERRITIN, TIBC, IRON, RETICCTPCT in the last 72 hours. Sepsis Labs: No results for input(s): PROCALCITON, LATICACIDVEN in the last 168 hours.  No results found for this or any previous visit (from the past 240 hour(s)).   Radiology Studies: Vas US Carotid (at [place] Only)  Result Date: 07/14/2018 Carotid Arterial Duplex Study Indications: CVA. Performing Technologist: Abram Sander RVS  Examination Guidelines: A complete evaluation includes B-mode imaging, spectral Doppler, color Doppler, and power Doppler as needed of all accessible portions of each vessel. Bilateral testing is considered an integral part of a complete examination. Limited examinations for reoccurring indications may be performed as noted.  Right Carotid  Findings: +----------+--------+--------+--------+------------+--------+           PSV cm/sEDV cm/sStenosisDescribe    Comments +----------+--------+--------+--------+------------+--------+ CCA Prox  85      7               heterogenous         +----------+--------+--------+--------+------------+--------+ CCA Distal94      12              heterogenous         +----------+--------+--------+--------+------------+--------+ ICA Prox  77      13      1-39%   heterogenous         +----------+--------+--------+--------+------------+--------+ ICA Distal75      14                                   +----------+--------+--------+--------+------------+--------+ ECA       381                                          +----------+--------+--------+--------+------------+--------+ +----------+--------+-------+--------+-------------------+           PSV cm/sEDV cmsDescribeArm Pressure (mmHG) +----------+--------+-------+--------+-------------------+ Subclavian170                                        +----------+--------+-------+--------+-------------------+ +---------+--------+--+--------+--+---------+ VertebralPSV cm/s39EDV cm/s10Antegrade +---------+--------+--+--------+--+---------+  Left Carotid Findings: +----------+--------+--------+--------+------------+--------+           PSV cm/sEDV cm/sStenosisDescribe    Comments +----------+--------+--------+--------+------------+--------+ CCA Prox  113     12              heterogenous         +----------+--------+--------+--------+------------+--------+ CCA Distal105     14              heterogenous         +----------+--------+--------+--------+------------+--------+ ICA Prox  89      30      1-39%   heterogenous         +----------+--------+--------+--------+------------+--------+ ICA Distal96      27                                    +----------+--------+--------+--------+------------+--------+  ECA       494                                          +----------+--------+--------+--------+------------+--------+ +----------+--------+--------+--------+-------------------+ SubclavianPSV cm/sEDV cm/sDescribeArm Pressure (mmHG) +----------+--------+--------+--------+-------------------+           185                                         +----------+--------+--------+--------+-------------------+ +---------+--------+--+--------+-+---------+ VertebralPSV cm/s39EDV cm/s6Antegrade +---------+--------+--+--------+-+---------+  Summary: Right Carotid: Velocities in the right ICA are consistent with a 1-39% stenosis.                The ECA appears >50% stenosed. Left Carotid: Velocities in the left ICA are consistent with a 1-39% stenosis.               The ECA appears >50% stenosed. Vertebrals: Bilateral vertebral arteries demonstrate antegrade flow. *See table(s) above for measurements and observations.  Electronically signed by Servando Snare MD on 07/14/2018 at 5:06:19 PM.    Final    Scheduled Meds: . gabapentin  600 mg Oral QHS  . insulin aspart  0-15 Units Subcutaneous TID WC  . insulin aspart  0-5 Units Subcutaneous QHS  . insulin aspart protamine- aspart  25 Units Subcutaneous BID WC  . insulin aspart protamine- aspart  5 Units Subcutaneous Q1200  . metoprolol tartrate  50 mg Oral BID  . pantoprazole  40 mg Oral Daily   Continuous Infusions:   LOS: 2 days   Kerney Elbe, DO [facility] PAGER is on AMION  If 7PM-7AM, please contact night-coverage [email or web address] Password [email or web address] 07/15/2018, 1:57 PM

## 2018-07-15 NOTE — Progress Notes (Signed)
ANTICOAGULATION CONSULT NOTE - Follow Up Consult  Pharmacy Consult for heparin Indication: new Afib in setting of CVA  Labs: Recent Labs    07/13/18 1000 07/14/18 0013 07/14/18 0747 07/14/18 1445 07/15/18 0439  HGB 16.0 14.7  --   --  16.1  HCT 46.9 43.4  --   --  46.0  PLT 225 212  --   --  211  APTT 26  --   --   --   --   LABPROT 14.3  --   --   --   --   INR 1.12  --   --   --   --   HEPARINUNFRC  --  0.32 0.49  --  0.66  CREATININE 1.03  --   --  0.94  --     Assessment/Plan:  84yo male therapeutic on heparin for Afib / CVA, heparin level slightly supra-therapeutic this AM Decrease heparin to 1200 units/hr Follow up AM labs  Thank you Anette Guarneri, PharmD [phone]   07/15/2018,8:35 AM

## 2018-07-15 NOTE — Progress Notes (Signed)
Progress Note  Patient Name: Andre Wolfe Date of Encounter: 07/15/2018  Primary Cardiologist: Menachem Urbanek [place], MD   Subjective   Feels well today. Denies any chest pain, dyspnea or palpitations.   Inpatient Medications    Scheduled Meds: . aspirin EC  81 mg Oral Daily  . gabapentin  600 mg Oral QHS  . insulin aspart  0-15 Units Subcutaneous TID WC  . insulin aspart  0-5 Units Subcutaneous QHS  . insulin aspart protamine- aspart  25 Units Subcutaneous BID WC  . insulin aspart protamine- aspart  5 Units Subcutaneous Q1200  . pantoprazole  40 mg Oral Daily   Continuous Infusions: . heparin 1,300 Units/hr (07/15/18 0300)   PRN Meds: acetaminophen **OR** acetaminophen (TYLENOL) oral liquid 160 mg/5 mL **OR** acetaminophen, phenol, senna-docusate   Vital Signs    Vitals:   07/14/18 1641 07/14/18 2015 07/14/18 2015 07/15/18 0423  BP: (!) 160/83 129/79 (!) 129/96 (!) 141/90  Pulse: 63 (!) 110 (!) 110 87  Resp: 16 18 (!) 22   Temp: 98.3 F (36.8 C) 98.8 F (37.1 C) 98.8 F (37.1 C) 98 F (36.7 C)  TempSrc: Oral Oral Oral   SpO2: 97% 98% 97% 96%  Weight:      Height:        Intake/Output Summary (Last 24 hours) at 07/15/2018 2951 Last data filed at 07/15/2018 0300 Gross per 24 hour  Intake 530.45 ml  Output -  Net 530.45 ml   Filed Weights   07/13/18 0932  Weight: 96.2 kg    Telemetry    Afib with RVR started at 17:25 yesterday - rate now 130 bpm Personally Reviewed  ECG    None today - Personally Reviewed  Physical Exam   GEN: No acute distress.   Neck: No JVD Cardiac: IRRR, no murmurs, rubs, or gallops.  Respiratory: Clear to auscultation bilaterally. GI: Soft, nontender, non-distended  MS: No edema; No deformity. Neuro:  Nonfocal  Psych: Normal affect   Labs    Chemistry Recent Labs  Lab 07/13/18 1000 07/14/18 1445 07/15/18 0742  NA 138 138 139  K 4.2 3.5 3.8  CL 103 103 106  CO2 25 21* 22  GLUCOSE 281* 158* 204*  BUN 12 15 9   CREATININE  1.03 0.94 0.99  CALCIUM 9.1 8.9 9.0  PROT  --  6.6 6.6  ALBUMIN  --  3.5 3.3*  AST  --  18 17  ALT  --  23 22  ALKPHOS  --  38 41  BILITOT  --  0.7 1.1  GFRNONAA >60 >60 >60  GFRAA >60 >60 >60  ANIONGAP 10 14 11      Hematology Recent Labs  Lab 07/13/18 1000 07/14/18 0013 07/15/18 0439  WBC 7.5 6.7 6.6  RBC 5.33 5.05 5.35  HGB 16.0 14.7 16.1  HCT 46.9 43.4 46.0  MCV 88.0 85.9 86.0  MCH 30.0 29.1 30.1  MCHC 34.1 33.9 35.0  RDW 12.5 12.3 12.3  PLT 225 212 211    Cardiac EnzymesNo results for input(s): TROPONINI in the last 168 hours. No results for input(s): TROPIPOC in the last 168 hours.   BNPNo results for input(s): BNP, PROBNP in the last 168 hours.   DDimer No results for input(s): DDIMER in the last 168 hours.   Radiology    Ct Head Wo Contrast  Result Date: 07/13/2018 CLINICAL DATA:  Left leg weakness, left facial droop and slurred speech since 07/08/2018. EXAM: CT HEAD WITHOUT CONTRAST TECHNIQUE: Contiguous axial  images were obtained from the base of the skull through the vertex without intravenous contrast. COMPARISON:  None. FINDINGS: Brain: The patient has an age indeterminate infarct in the right pons. Left occipital lobe infarct appears more remote but can not be definitively characterized. No mass, hemorrhage, midline shift or abnormal extra-axial fluid collection is identified. No hydrocephalus or pneumocephalus. Vascular: Atherosclerosis is noted. Skull: Intact.  No focal lesion. Sinuses/Orbits: Small mucous retention cyst or polyp left maxillary sinus noted. There is minimal mucosal thickening in the right maxillary sinus. Other: None. IMPRESSION: Right pontine infarct is age indeterminate. Left occipital lobe infarct appears more remote but also can not be definitively characterized. Atrophy and chronic microvascular ischemic change. Atherosclerosis. Electronically Signed   By: Inge Rise M.D.   On: 07/13/2018 11:27   Mr Jodene Nam Head Wo Contrast  Result  Date: 07/13/2018 CLINICAL DATA:  84 y/o M; left leg weakness, slurred speech, left facial droop 07/08/2018. EXAM: MRI HEAD WITHOUT CONTRAST MRA HEAD WITHOUT CONTRAST TECHNIQUE: Multiplanar, multiecho pulse sequences of the brain and surrounding structures were obtained without intravenous contrast. Angiographic images of the head were obtained using MRA technique without contrast. COMPARISON:  07/13/2018 CT head. FINDINGS: MRI HEAD FINDINGS Brain: Right paramedian pontine focus of reduced diffusion measuring 15 x 10 mm (AP by ML series 5, image 64) compatible with acute/early subacute infarction. Additional punctate focus of infarction within the left parietal cortex. No associated hemorrhage or mass effect. Several nonspecific T2 FLAIR hyperintensities in subcortical and periventricular white matter are compatible with mild chronic microvascular ischemic changes for age. Mild volume loss of the brain. No extra-axial collection, hydrocephalus, mass effect, or herniation. No abnormal susceptibility hypointensity to indicate intracranial hemorrhage. Vascular: Normal flow voids. Skull and upper cervical spine: Normal marrow signal. Sinuses/Orbits: Mild left maxillary sinus mucosal thickening. No additional abnormal signal of the visible paranasal sinuses and mastoid air cells. Bilateral intra-ocular lens replacement. Other: None. MRA HEAD FINDINGS Internal carotid arteries: Patent. Lumen irregularity of the internal carotid arteries compatible with atherosclerosis. Mild proximal left cavernous stenosis and mild-to-moderate right paraclinoid ICA stenosis. Anterior cerebral arteries: Patent. Large left A1 and anterior communicating artery, normal variant. Middle cerebral arteries: Patent. Mild non stenotic lumen irregularity of M1 segments compatible with atherosclerosis. Right M2 inferior division origin mild stenosis. Anterior communicating artery: Patent. Posterior communicating arteries:  Patent. Posterior cerebral  arteries: Patent. Moderate left P1 stenosis. Mild distal right P2 stenosis. Basilar artery:  Patent.  Mild mid basilar stenosis. Vertebral arteries:  Patent. No aneurysm or large vessel occlusion. IMPRESSION: MRI head: 1. Right paramedian pons acute/early subacute infarction. Additional punctate infarction in left parietal cortex. No hemorrhage or mass effect. 2. Mild for age chronic microvascular ischemic changes and volume loss of the brain. MRA head: 1. No large vessel occlusion or aneurysm identified. 2. Intracranial atherosclerosis with multiple segments of mild-to-moderate stenosis in the anterior and posterior circulation. Electronically Signed   By: Kristine Garbe M.D.   On: 07/13/2018 13:45   Mr Brain Wo Contrast  Result Date: 07/13/2018 CLINICAL DATA:  84 y/o M; left leg weakness, slurred speech, left facial droop 07/08/2018. EXAM: MRI HEAD WITHOUT CONTRAST MRA HEAD WITHOUT CONTRAST TECHNIQUE: Multiplanar, multiecho pulse sequences of the brain and surrounding structures were obtained without intravenous contrast. Angiographic images of the head were obtained using MRA technique without contrast. COMPARISON:  07/13/2018 CT head. FINDINGS: MRI HEAD FINDINGS Brain: Right paramedian pontine focus of reduced diffusion measuring 15 x 10 mm (AP by ML series 5, image  35) compatible with acute/early subacute infarction. Additional punctate focus of infarction within the left parietal cortex. No associated hemorrhage or mass effect. Several nonspecific T2 FLAIR hyperintensities in subcortical and periventricular white matter are compatible with mild chronic microvascular ischemic changes for age. Mild volume loss of the brain. No extra-axial collection, hydrocephalus, mass effect, or herniation. No abnormal susceptibility hypointensity to indicate intracranial hemorrhage. Vascular: Normal flow voids. Skull and upper cervical spine: Normal marrow signal. Sinuses/Orbits: Mild left maxillary sinus  mucosal thickening. No additional abnormal signal of the visible paranasal sinuses and mastoid air cells. Bilateral intra-ocular lens replacement. Other: None. MRA HEAD FINDINGS Internal carotid arteries: Patent. Lumen irregularity of the internal carotid arteries compatible with atherosclerosis. Mild proximal left cavernous stenosis and mild-to-moderate right paraclinoid ICA stenosis. Anterior cerebral arteries: Patent. Large left A1 and anterior communicating artery, normal variant. Middle cerebral arteries: Patent. Mild non stenotic lumen irregularity of M1 segments compatible with atherosclerosis. Right M2 inferior division origin mild stenosis. Anterior communicating artery: Patent. Posterior communicating arteries:  Patent. Posterior cerebral arteries: Patent. Moderate left P1 stenosis. Mild distal right P2 stenosis. Basilar artery:  Patent.  Mild mid basilar stenosis. Vertebral arteries:  Patent. No aneurysm or large vessel occlusion. IMPRESSION: MRI head: 1. Right paramedian pons acute/early subacute infarction. Additional punctate infarction in left parietal cortex. No hemorrhage or mass effect. 2. Mild for age chronic microvascular ischemic changes and volume loss of the brain. MRA head: 1. No large vessel occlusion or aneurysm identified. 2. Intracranial atherosclerosis with multiple segments of mild-to-moderate stenosis in the anterior and posterior circulation. Electronically Signed   By: Kristine Garbe M.D.   On: 07/13/2018 13:45   Vas US Carotid (at [place] Only)  Result Date: 07/14/2018 Carotid Arterial Duplex Study Indications: CVA. Performing Technologist: Abram Sander RVS  Examination Guidelines: A complete evaluation includes B-mode imaging, spectral Doppler, color Doppler, and power Doppler as needed of all accessible portions of each vessel. Bilateral testing is considered an integral part of a complete examination. Limited examinations for reoccurring indications may be  performed as noted.  Right Carotid Findings: +----------+--------+--------+--------+------------+--------+           PSV cm/sEDV cm/sStenosisDescribe    Comments +----------+--------+--------+--------+------------+--------+ CCA Prox  85      7               heterogenous         +----------+--------+--------+--------+------------+--------+ CCA Distal94      12              heterogenous         +----------+--------+--------+--------+------------+--------+ ICA Prox  77      13      1-39%   heterogenous         +----------+--------+--------+--------+------------+--------+ ICA Distal75      14                                   +----------+--------+--------+--------+------------+--------+ ECA       381                                          +----------+--------+--------+--------+------------+--------+ +----------+--------+-------+--------+-------------------+           PSV cm/sEDV cmsDescribeArm Pressure (mmHG) +----------+--------+-------+--------+-------------------+ Subclavian170                                        +----------+--------+-------+--------+-------------------+ +---------+--------+--+--------+--+---------+  VertebralPSV cm/s39EDV cm/s10Antegrade +---------+--------+--+--------+--+---------+  Left Carotid Findings: +----------+--------+--------+--------+------------+--------+           PSV cm/sEDV cm/sStenosisDescribe    Comments +----------+--------+--------+--------+------------+--------+ CCA Prox  113     12              heterogenous         +----------+--------+--------+--------+------------+--------+ CCA Distal105     14              heterogenous         +----------+--------+--------+--------+------------+--------+ ICA Prox  89      30      1-39%   heterogenous         +----------+--------+--------+--------+------------+--------+ ICA Distal96      27                                    +----------+--------+--------+--------+------------+--------+ ECA       494                                          +----------+--------+--------+--------+------------+--------+ +----------+--------+--------+--------+-------------------+ SubclavianPSV cm/sEDV cm/sDescribeArm Pressure (mmHG) +----------+--------+--------+--------+-------------------+           185                                         +----------+--------+--------+--------+-------------------+ +---------+--------+--+--------+-+---------+ VertebralPSV cm/s39EDV cm/s6Antegrade +---------+--------+--+--------+-+---------+  Summary: Right Carotid: Velocities in the right ICA are consistent with a 1-39% stenosis.                The ECA appears >50% stenosed. Left Carotid: Velocities in the left ICA are consistent with a 1-39% stenosis.               The ECA appears >50% stenosed. Vertebrals: Bilateral vertebral arteries demonstrate antegrade flow. *See table(s) above for measurements and observations.  Electronically signed by Servando Snare MD on 07/14/2018 at 5:06:19 PM.    Final     Cardiac Studies   Echo: Study Conclusions  - Left ventricle: The cavity size was normal. Wall thickness was   normal. Systolic function was normal. The estimated ejection   fraction was in the range of 60% to 65%. Wall motion was normal;   there were no regional wall motion abnormalities. Features are   consistent with a pseudonormal left ventricular filling pattern,   with concomitant abnormal relaxation and increased filling   pressure (grade 2 diastolic dysfunction). Acoustic contrast   opacification revealed no evidence ofthrombus. - Ventricular septum: Septal motion showed paradox. - Mitral valve: Calcified annulus. Valve area by pressure   half-time: 2.32 cm^2. - Left atrium: The atrium was mildly dilated.   Patient Profile     84 y.o. male 84 y.o. male with a hx of CAD s/p CABG 12/2015 with post-op atrial fib,  reduced EF on OP note, TIAs, IDDM, HTN, HLD, peripheral neuropathy, prostate CA, GERD who is being seen  for the evaluation of stroke at the request of Dr. Lorin Mercy.   Assessment & Plan    1. Stroke, suspected embolic (with prior h/o post-op atrial fib 2017, now with clearly recurrent Afib on telemetry.. He has a high CHADSVASC score therefore we will start on  Eliquis 5 mg bid. He has tolerated this well in  the past. Will DC IV heparin and ASA in setting of DOAC.   2. Afib with RVR. He is asymptomatic. HR sustained at 130 bpm. Will initiate anticoagulation. Will start metoprolol for rate control. If he remains asymptomatic will pursue rate control strategy.  3. CAD s/p CABG 2017 without angina   4. HTN - management per neuro team.  5. HLD - pending Repatha as OP.      For questions or updates, please contact [place] Please consult [email or web address] for contact info under        Signed, Meryem Haertel [place], MD  07/15/2018, 9:28 AM

## 2018-07-16 ENCOUNTER — Encounter (HOSPITAL_COMMUNITY): Payer: Self-pay

## 2018-07-16 ENCOUNTER — Other Ambulatory Visit: Payer: Self-pay

## 2018-07-16 ENCOUNTER — Telehealth: Payer: Self-pay | Admitting: Physician Assistant

## 2018-07-16 ENCOUNTER — Inpatient Hospital Stay (HOSPITAL_COMMUNITY)
Admission: RE | Admit: 2018-07-16 | Discharge: 2018-07-26 | DRG: 057 | Disposition: A | Discharge status: Home Health | Payer: Medicare Other | Source: Intra-hospital | Attending: Physical Medicine & Rehabilitation | Admitting: Physical Medicine & Rehabilitation

## 2018-07-16 DIAGNOSIS — I6931 Attention and concentration deficit following cerebral infarction: Secondary | ICD-10-CM

## 2018-07-16 DIAGNOSIS — I480 Paroxysmal atrial fibrillation: Secondary | ICD-10-CM | POA: Diagnosis present

## 2018-07-16 DIAGNOSIS — R7309 Other abnormal glucose: Secondary | ICD-10-CM

## 2018-07-16 DIAGNOSIS — Z833 Family history of diabetes mellitus: Secondary | ICD-10-CM

## 2018-07-16 DIAGNOSIS — F17210 Nicotine dependence, cigarettes, uncomplicated: Secondary | ICD-10-CM | POA: Diagnosis present

## 2018-07-16 DIAGNOSIS — I69354 Hemiplegia and hemiparesis following cerebral infarction affecting left non-dominant side: Principal | ICD-10-CM

## 2018-07-16 DIAGNOSIS — R269 Unspecified abnormalities of gait and mobility: Secondary | ICD-10-CM | POA: Diagnosis not present

## 2018-07-16 DIAGNOSIS — Z803 Family history of malignant neoplasm of breast: Secondary | ICD-10-CM

## 2018-07-16 DIAGNOSIS — I69322 Dysarthria following cerebral infarction: Secondary | ICD-10-CM

## 2018-07-16 DIAGNOSIS — R001 Bradycardia, unspecified: Secondary | ICD-10-CM | POA: Diagnosis not present

## 2018-07-16 DIAGNOSIS — Z8249 Family history of ischemic heart disease and other diseases of the circulatory system: Secondary | ICD-10-CM

## 2018-07-16 DIAGNOSIS — Z794 Long term (current) use of insulin: Secondary | ICD-10-CM | POA: Diagnosis not present

## 2018-07-16 DIAGNOSIS — E785 Hyperlipidemia, unspecified: Secondary | ICD-10-CM | POA: Diagnosis present

## 2018-07-16 DIAGNOSIS — Z8546 Personal history of malignant neoplasm of prostate: Secondary | ICD-10-CM

## 2018-07-16 DIAGNOSIS — Z806 Family history of leukemia: Secondary | ICD-10-CM

## 2018-07-16 DIAGNOSIS — Z7982 Long term (current) use of aspirin: Secondary | ICD-10-CM

## 2018-07-16 DIAGNOSIS — Z951 Presence of aortocoronary bypass graft: Secondary | ICD-10-CM

## 2018-07-16 DIAGNOSIS — K219 Gastro-esophageal reflux disease without esophagitis: Secondary | ICD-10-CM | POA: Diagnosis present

## 2018-07-16 DIAGNOSIS — E1142 Type 2 diabetes mellitus with diabetic polyneuropathy: Secondary | ICD-10-CM | POA: Diagnosis present

## 2018-07-16 DIAGNOSIS — I69318 Other symptoms and signs involving cognitive functions following cerebral infarction: Secondary | ICD-10-CM

## 2018-07-16 DIAGNOSIS — I2510 Atherosclerotic heart disease of native coronary artery without angina pectoris: Secondary | ICD-10-CM | POA: Diagnosis present

## 2018-07-16 DIAGNOSIS — I440 Atrioventricular block, first degree: Secondary | ICD-10-CM | POA: Diagnosis present

## 2018-07-16 DIAGNOSIS — E119 Type 2 diabetes mellitus without complications: Secondary | ICD-10-CM | POA: Diagnosis not present

## 2018-07-16 DIAGNOSIS — I6350 Cerebral infarction due to unspecified occlusion or stenosis of unspecified cerebral artery: Secondary | ICD-10-CM | POA: Diagnosis not present

## 2018-07-16 DIAGNOSIS — E1165 Type 2 diabetes mellitus with hyperglycemia: Secondary | ICD-10-CM | POA: Diagnosis present

## 2018-07-16 DIAGNOSIS — I10 Essential (primary) hypertension: Secondary | ICD-10-CM | POA: Diagnosis present

## 2018-07-16 DIAGNOSIS — I69398 Other sequelae of cerebral infarction: Secondary | ICD-10-CM | POA: Diagnosis not present

## 2018-07-16 LAB — CBC WITH DIFFERENTIAL/PLATELET
ABS IMMATURE GRANULOCYTES: 0.04 10*3/uL (ref 0.00–0.07)
BASOS PCT: 1 %
Basophils Absolute: 0.1 10*3/uL (ref 0.0–0.1)
Eosinophils Absolute: 0.2 10*3/uL (ref 0.0–0.5)
Eosinophils Relative: 3 %
HCT: 43.6 % (ref 39.0–52.0)
Hemoglobin: 15.5 g/dL (ref 13.0–17.0)
IMMATURE GRANULOCYTES: 1 %
Lymphocytes Relative: 24 %
Lymphs Abs: 1.7 10*3/uL (ref 0.7–4.0)
MCH: 30.6 pg (ref 26.0–34.0)
MCHC: 35.6 g/dL (ref 30.0–36.0)
MCV: 86 fL (ref 80.0–100.0)
Monocytes Absolute: 0.9 10*3/uL (ref 0.1–1.0)
Monocytes Relative: 13 %
NEUTROS PCT: 58 %
Neutro Abs: 4.2 10*3/uL (ref 1.7–7.7)
PLATELETS: 190 10*3/uL (ref 150–400)
RBC: 5.07 MIL/uL (ref 4.22–5.81)
RDW: 12.5 % (ref 11.5–15.5)
WBC: 7.1 10*3/uL (ref 4.0–10.5)
nRBC: 0 % (ref 0.0–0.2)

## 2018-07-16 LAB — PHOSPHORUS: PHOSPHORUS: 3.9 mg/dL (ref 2.5–4.6)

## 2018-07-16 LAB — COMPREHENSIVE METABOLIC PANEL
ALT: 19 U/L (ref 0–44)
AST: 33 U/L (ref 15–41)
Albumin: 3.3 g/dL — ABNORMAL LOW (ref 3.5–5.0)
Alkaline Phosphatase: 38 U/L (ref 38–126)
Anion gap: 9 (ref 5–15)
BUN: 13 mg/dL (ref 8–23)
CHLORIDE: 107 mmol/L (ref 98–111)
CO2: 22 mmol/L (ref 22–32)
Calcium: 8.9 mg/dL (ref 8.9–10.3)
Creatinine, Ser: 1.04 mg/dL (ref 0.61–1.24)
GFR calc Af Amer: >60 mL/min (ref >60)
GFR calc non Af Amer: >60 mL/min (ref >60)
Glucose, Bld: 111 mg/dL — ABNORMAL HIGH (ref 70–99)
Potassium: 4.2 mmol/L (ref 3.5–5.1)
Sodium: 138 mmol/L (ref 135–145)
Total Bilirubin: 2 mg/dL — ABNORMAL HIGH (ref 0.3–1.2)
Total Protein: 6.2 g/dL — ABNORMAL LOW (ref 6.5–8.1)

## 2018-07-16 LAB — GLUCOSE, CAPILLARY
GLUCOSE-CAPILLARY: 124 mg/dL — AB (ref 70–99)
Glucose-Capillary: 186 mg/dL — ABNORMAL HIGH (ref 70–99)
Glucose-Capillary: 83 mg/dL (ref 70–99)
Glucose-Capillary: 132 mg/dL — ABNORMAL HIGH (ref 70–99)

## 2018-07-16 MED ORDER — BISACODYL 10 MG RE SUPP: 10.0000 mg | Freq: Every day | RECTAL | Status: DC | PRN

## 2018-07-16 MED ORDER — PROCHLORPERAZINE MALEATE 5 MG PO TABS: 5.0000 mg | ORAL_TABLET | Freq: Four times a day (QID) | ORAL | Status: DC | PRN

## 2018-07-16 MED ORDER — SENNOSIDES-DOCUSATE SODIUM 8.6-50 MG PO TABS: 1.0000 | ORAL_TABLET | Freq: Every evening | ORAL | Status: DC | PRN

## 2018-07-16 MED ORDER — INSULIN ASPART PROT & ASPART (70-30 MIX) 100 UNIT/ML ~~LOC~~ SUSP
25.0000 [IU] | Freq: Two times a day (BID) | SUBCUTANEOUS | Status: DC
  Administered 2018-07-17 – 2018-07-26 (×18): 25 [IU] via SUBCUTANEOUS
  Filled 2018-07-16 (×2): qty 10

## 2018-07-16 MED ORDER — GABAPENTIN 600 MG PO TABS
600.0000 mg | ORAL_TABLET | Freq: Every day | ORAL | Status: DC
  Administered 2018-07-16 – 2018-07-25 (×10): 600 mg via ORAL
  Filled 2018-07-16 (×10): qty 1

## 2018-07-16 MED ORDER — INSULIN ASPART 100 UNIT/ML ~~LOC~~ SOLN: 0.0000 [IU] | Freq: Every day | SUBCUTANEOUS | Status: DC

## 2018-07-16 MED ORDER — METOPROLOL TARTRATE 50 MG PO TABS: 50.0000 mg | ORAL_TABLET | Freq: Two times a day (BID) | ORAL | 0 refills | Status: DC

## 2018-07-16 MED ORDER — ACETAMINOPHEN 325 MG PO TABS
325.0000 mg | ORAL_TABLET | ORAL | Status: DC | PRN
  Administered 2018-07-23: 650 mg via ORAL
  Filled 2018-07-16: qty 2

## 2018-07-16 MED ORDER — POLYETHYLENE GLYCOL 3350 17 G PO PACK
17.0000 g | PACK | Freq: Every day | ORAL | Status: DC | PRN
  Administered 2018-07-18: 17 g via ORAL
  Filled 2018-07-16: qty 1

## 2018-07-16 MED ORDER — PANTOPRAZOLE SODIUM 40 MG PO TBEC
40.0000 mg | DELAYED_RELEASE_TABLET | Freq: Every day | ORAL | Status: DC
  Administered 2018-07-17 – 2018-07-26 (×10): 40 mg via ORAL
  Filled 2018-07-16 (×10): qty 1

## 2018-07-16 MED ORDER — INSULIN ASPART PROT & ASPART (70-30 MIX) 100 UNIT/ML ~~LOC~~ SUSP: 5.0000 [IU] | Freq: Every day | SUBCUTANEOUS | Status: DC

## 2018-07-16 MED ORDER — PHENOL 1.4 % MT LIQD: 1.0000 | OROMUCOSAL | Status: DC | PRN

## 2018-07-16 MED ORDER — GUAIFENESIN-DM 100-10 MG/5ML PO SYRP: 5.0000 mL | ORAL_SOLUTION | Freq: Four times a day (QID) | ORAL | Status: DC | PRN

## 2018-07-16 MED ORDER — APIXABAN 5 MG PO TABS
5.0000 mg | ORAL_TABLET | Freq: Two times a day (BID) | ORAL | Status: DC
  Administered 2018-07-16 – 2018-07-26 (×20): 5 mg via ORAL
  Filled 2018-07-16 (×20): qty 1

## 2018-07-16 MED ORDER — APIXABAN 5 MG PO TABS
5.0000 mg | ORAL_TABLET | Freq: Two times a day (BID) | ORAL | Status: DC
  Administered 2018-07-16: 5 mg via ORAL
  Filled 2018-07-16: qty 1

## 2018-07-16 MED ORDER — TRAZODONE HCL 50 MG PO TABS: 25.0000 mg | ORAL_TABLET | Freq: Every evening | ORAL | Status: DC | PRN

## 2018-07-16 MED ORDER — DIPHENHYDRAMINE HCL 12.5 MG/5ML PO ELIX: 12.5000 mg | ORAL_SOLUTION | Freq: Four times a day (QID) | ORAL | Status: DC | PRN

## 2018-07-16 MED ORDER — FLEET ENEMA 7-19 GM/118ML RE ENEM: 1.0000 | ENEMA | Freq: Once | RECTAL | Status: DC | PRN

## 2018-07-16 MED ORDER — METOPROLOL TARTRATE 50 MG PO TABS
50.0000 mg | ORAL_TABLET | Freq: Two times a day (BID) | ORAL | Status: DC
  Administered 2018-07-16 – 2018-07-19 (×7): 50 mg via ORAL
  Filled 2018-07-16 (×8): qty 1

## 2018-07-16 MED ORDER — LOSARTAN POTASSIUM 100 MG PO TABS: 50.0000 mg | ORAL_TABLET | Freq: Every day | ORAL | 0 refills | Status: DC

## 2018-07-16 MED ORDER — APIXABAN 5 MG PO TABS: 5.0000 mg | ORAL_TABLET | Freq: Two times a day (BID) | ORAL | 0 refills | Status: DC

## 2018-07-16 MED ORDER — ALUM & MAG HYDROXIDE-SIMETH 200-200-20 MG/5ML PO SUSP: 30.0000 mL | ORAL | Status: DC | PRN

## 2018-07-16 MED ORDER — INSULIN ASPART 100 UNIT/ML ~~LOC~~ SOLN
0.0000 [IU] | Freq: Three times a day (TID) | SUBCUTANEOUS | Status: DC
  Administered 2018-07-17: 2 [IU] via SUBCUTANEOUS
  Administered 2018-07-17: 3 [IU] via SUBCUTANEOUS
  Administered 2018-07-18: 2 [IU] via SUBCUTANEOUS
  Administered 2018-07-18: 5 [IU] via SUBCUTANEOUS
  Administered 2018-07-19: 3 [IU] via SUBCUTANEOUS
  Administered 2018-07-19 – 2018-07-21 (×4): 2 [IU] via SUBCUTANEOUS
  Administered 2018-07-22: 3 [IU] via SUBCUTANEOUS
  Administered 2018-07-23: 2 [IU] via SUBCUTANEOUS
  Administered 2018-07-24 (×2): 3 [IU] via SUBCUTANEOUS
  Administered 2018-07-25 – 2018-07-26 (×3): 2 [IU] via SUBCUTANEOUS

## 2018-07-16 MED ORDER — PROCHLORPERAZINE 25 MG RE SUPP: 12.5000 mg | Freq: Four times a day (QID) | RECTAL | Status: DC | PRN

## 2018-07-16 MED ORDER — PROCHLORPERAZINE EDISYLATE 10 MG/2ML IJ SOLN: 5.0000 mg | Freq: Four times a day (QID) | INTRAMUSCULAR | Status: DC | PRN

## 2018-07-16 NOTE — H&P (Signed)
Physical Medicine and Rehabilitation Admission H&P    CC: Functional deficits due to stroke   HPI: Andre Wolfe is an 84 year old male with history of HTN, CAD, T2DM, peripheral neuropathy, TIAs, PAF s/p DCCV and eliquis d/c recently;  who has had declin in mobility, weakness with unsteady gait, increased falls and slurred speech that started on 12/25.  UDS negative he was admitted on 07/13/2018 with left-sided weakness with tendency to drag left lower extremity.  CT head done revealing remote left occipital lobe infarct and age indeterminate right pontine infarct.  MRI/MRA brain done revealing right paramedian pontine acute/early subacute infarct with punctate infarct in left parietal cortex, mild chronic microvascular ischemic changes and no large vessel occlusion or aneurysm.  Patient noted to have A. fib at admission and was started on IV heparin. 2D echo showed EF 60 to 65% with no wall abnormality and grade 2 diastolic dysfunction with calcified mitral valve.  Carotid Doppler showed no significant ICA stenosis and greater than 50% ECA stenosis.  Dr. [place] consulted for input and recommended Eliquis twice daily as well as addition of Repatha for dyslipidemia.  Developed A. fib with RVR yesterday and has converted to NSR and cardiology recommends not to increase beta-blocker due to resting bradycardia.  Therapy evaluations done revealing revealed mild dysarthria with deficits in attention recall, poor insight with lack of safety awareness, left sided weakness with balance deficits affecting functional status.   CIR recommended for follow-up therapy.     ROS Past Medical History:  Diagnosis Date  . Arrhythmia    post op Afib.  . Arthritis    "joints" (07/13/2018)  . Coronary artery disease    a. s/p CABG 12/2015.  Marland Kitchen CVA (cerebral vascular accident) ([place]) 07/13/2018   right pontine infarct; "said he's had one in the past; probably 2 yrs ago; same symptoms" (07/13/2018)  . Diabetic  peripheral neuropathy ([place])   . GERD (gastroesophageal reflux disease)   . Hyperlipidemia   . Hypertension   . Postoperative atrial fibrillation ([place])   . Prostate cancer ([place])    d'x 2015/2016  . TIA (transient ischemic attack)   . Type II diabetes mellitus ([place])     Past Surgical History:  Procedure Laterality Date  . CARDIAC CATHETERIZATION  2017   "not completed; went straight to [facility]"  . CATARACT EXTRACTION W/ INTRAOCULAR LENS  IMPLANT, BILATERAL Bilateral   . CORONARY ARTERY BYPASS GRAFT  2017   "CABG X 4"  . TONSILLECTOMY      Family History  Problem Relation Age of Onset  . Heart disease Mother   . Breast cancer Mother   . Leukemia Father   . Diabetes Brother   . Breast cancer Sister   . Stroke Neg Hx     Social History:  Married. Independent with walker PTA. He  reports that he has been smoking. He has a 7.80 pack-year smoking history. He has never used smokeless tobacco. He reports previous alcohol use. He reports that he does not use drugs.    Allergies: No Known Allergies    Medications Prior to Admission  Medication Sig Dispense Refill  . amLODipine (NORVASC) 2.5 MG tablet Take 1 tablet (2.5 mg total) by mouth daily. 90 tablet 3  . aspirin EC 81 MG tablet Take 81 mg by mouth daily.    . [facility] OMEPRAZOLE PO Take 20 mg by mouth 4 (four) times daily.    Marland Kitchen gabapentin (NEURONTIN) 600 MG tablet Take 600 mg by mouth  at bedtime.   1  . ibuprofen (ADVIL,MOTRIN) 200 MG tablet Take 600 mg by mouth every 6 (six) hours as needed for moderate pain.    . Insulin Isophane & Regular Human (NOVOLIN 70/30 FLEXPEN) (70-30) 100 UNIT/ML PEN Inject 5-25 Units into the skin 3 (three) times daily. Taking 25 units in the Am and 5 units Midday, and 25 units before dinner.    . metoprolol tartrate (LOPRESSOR) 25 MG tablet Take 25 mg by mouth 2 (two) times daily.   3  . [DISCONTINUED] losartan (COZAAR) 100 MG tablet Take 100 mg by mouth daily.    . Evolocumab (REPATHA SURECLICK) 433 MG/ML  SOAJ Inject 140 mg into the skin every 14 (fourteen) days. 2 pen 11    Drug Regimen Review  Drug regimen was reviewed and remains appropriate with no significant issues identified  Home: Home Living Family/patient expects to be discharged to:: Private residence Living Arrangements: Spouse/significant other Available Help at Discharge: Family, Available 24 hours/day Type of Home: House Home Access: Level entry, Ramped entrance Home Layout: One level Bathroom Shower/Tub: Multimedia programmer: Standard Bathroom Accessibility: No Home Equipment: Environmental consultant - 2 wheels, Cane - single point, Bedside commode, Shower seat, Grab bars - tub/shower, Wheelchair - manual  Lives With: Spouse   Functional History: Prior Function Level of Independence: Independent with assistive device(s) Comments: Just prior to coming to the hospital was using a RW; @ 2 months ago was using a cane in the community only; went shopping with his wife - used grocery cart; does not drive  Functional Status:  Mobility: Bed Mobility Overal bed mobility: Needs Assistance Bed Mobility: Supine to Sit Supine to sit: Min assist, HOB elevated General bed mobility comments: to lift trunk, to supine assist for positioning Transfers Overall transfer level: Needs assistance Equipment used: Rolling walker (2 wheeled) Transfers: Sit to/from Stand Sit to Stand: Min assist General transfer comment: cues to push up from bed, sat on bed with uncontrolled descent despite cues to reach back Ambulation/Gait Ambulation/Gait assistance: Min assist Gait Distance (Feet): 200 Feet Assistive device: Rolling walker (2 wheeled) Gait Pattern/deviations: Step-to pattern, Step-through pattern, Decreased stride length, Decreased dorsiflexion - left, Shuffle General Gait Details: over time with L LE dragging on floor and increased assist on turns with cues to pick up foot and to stay inside walker    ADL: ADL Overall ADL's : Needs  assistance/impaired Eating/Feeding: Set up Grooming: Minimal assistance, Standing Grooming Details (indicate cue type and reason): Appeasr to demonstrate motor impersistance in standing LLE; "melts down" Upper Body Bathing: Minimal assistance, Sitting Lower Body Bathing: Minimal assistance, Sit to/from stand Upper Body Dressing : Minimal assistance, Sitting Lower Body Dressing: Moderate assistance, Sit to/from stand Toilet Transfer: Minimal assistance, Ambulation, Comfort height toilet, Grab bars Toileting- Clothing Manipulation and Hygiene: Minimal assistance, Sit to/from stand Toileting - Clothing Manipulation Details (indicate cue type and reason): vc to clean thoroughly Functional mobility during ADLs: Minimal assistance, Rolling walker, Cueing for safety, Cueing for sequencing General ADL Comments: Problem solving appeasr slow; decreased awareness at times; unaware of "draggin LLE when walking; trying to step at times when LLE not fully underneath him; posteiror lean whe attempting to donn pants; appesr unaware that he is leaning posteriorly  Cognition: Cognition Overall Cognitive Status: Impaired/Different from baseline Arousal/Alertness: Awake/alert Orientation Level: Oriented X4 Attention: Alternating Alternating Attention: Impaired Alternating Attention Impairment: Verbal basic Memory: Impaired Memory Impairment: Retrieval deficit Awareness: Impaired Awareness Impairment: Emergent impairment Safety/Judgment: Impaired Cognition Arousal/Alertness: Awake/alert Behavior During  Therapy: WFL for tasks assessed/performed Overall Cognitive Status: Impaired/Different from baseline Area of Impairment: Safety/judgement Safety/Judgement: Decreased awareness of safety, Decreased awareness of deficits  Physical Exam: Blood pressure 135/79, pulse (!) 51, temperature 98.2 F (36.8 C), temperature source Oral, resp. rate 18, height 5\' 9"  (1.753 m), weight 96.2 kg, SpO2 98 %. Physical  Exam  Constitutional: He appears well-developed. No distress.  HENT:  Head: Normocephalic and atraumatic.  Eyes: Pupils are equal, round, and reactive to light. EOM are normal.  Neck: Normal range of motion. No tracheal deviation present. No thyromegaly present.  Cardiovascular: Normal rate. Exam reveals no friction rub.  No murmur heard. Respiratory: Effort normal. No respiratory distress. He has no wheezes.  GI: Soft. He exhibits no distension. There is no abdominal tenderness.  Musculoskeletal: Normal range of motion.  Neurological:  Pt alert. Speech sl dysarthric. Left central 7. RUE and RLE 5/5. LUE 4/5. LLE 4/5. Decreased [place] on left. Good insight and awareness. Functional language and memory  Skin: Skin is warm and dry.  Psychiatric: He has a normal mood and affect. His behavior is normal.    Results for orders placed or performed during the hospital encounter of 07/13/18 (from the past 48 hour(s))  Comprehensive metabolic panel     Status: Abnormal   Collection Time: 07/14/18  2:45 PM  Result Value Ref Range   Sodium 138 135 - 145 mmol/L   Potassium 3.5 3.5 - 5.1 mmol/L   Chloride 103 98 - 111 mmol/L   CO2 21 (L) 22 - 32 mmol/L   Glucose, Bld 158 (H) 70 - 99 mg/dL   BUN 15 8 - 23 mg/dL   Creatinine, Ser 0.94 0.61 - 1.24 mg/dL   Calcium 8.9 8.9 - 10.3 mg/dL   Total Protein 6.6 6.5 - 8.1 g/dL   Albumin 3.5 3.5 - 5.0 g/dL   AST 18 15 - 41 U/L   ALT 23 0 - 44 U/L   Alkaline Phosphatase 38 38 - 126 U/L   Total Bilirubin 0.7 0.3 - 1.2 mg/dL   GFR calc non Af Amer >60 >60 mL/min   GFR calc Af Amer >60 >60 mL/min   Anion gap 14 5 - 15    Comment: Performed at [facility], [address], [place], [place] [address]  Magnesium     Status: None   Collection Time: 07/14/18  2:45 PM  Result Value Ref Range   Magnesium 1.7 1.7 - 2.4 mg/dL    Comment: Performed at [facility], [place] [address] [place], [place] [address]  Phosphorus     Status: None   Collection  Time: 07/14/18  2:45 PM  Result Value Ref Range   Phosphorus 3.7 2.5 - 4.6 mg/dL    Comment: Performed at [facility], [place] [address] [place], [place] [address]  Glucose, capillary     Status: Abnormal   Collection Time: 07/14/18  4:40 PM  Result Value Ref Range   Glucose-Capillary 170 (H) 70 - 99 mg/dL   Comment 1 Notify RN    Comment 2 Document in Chart   Glucose, capillary     Status: Abnormal   Collection Time: 07/14/18  9:07 PM  Result Value Ref Range   Glucose-Capillary 165 (H) 70 - 99 mg/dL  Heparin level (unfractionated)     Status: None   Collection Time: 07/15/18  4:39 AM  Result Value Ref Range   Heparin Unfractionated 0.66 0.30 - 0.70 IU/mL  Comment: (NOTE) If heparin results are below expected values, and patient dosage has  been confirmed, suggest follow up testing of antithrombin III levels. Performed at [facility], [place] [address], [place] [address]   CBC     Status: None   Collection Time: 07/15/18  4:39 AM  Result Value Ref Range   WBC 6.6 4.0 - 10.5 K/uL   RBC 5.35 4.22 - 5.81 MIL/uL   Hemoglobin 16.1 13.0 - 17.0 g/dL   HCT 46.0 39.0 - 52.0 %   MCV 86.0 80.0 - 100.0 fL   MCH 30.1 26.0 - 34.0 pg   MCHC 35.0 30.0 - 36.0 g/dL   RDW 12.3 11.5 - 15.5 %   Platelets 211 150 - 400 K/uL   nRBC 0.0 0.0 - 0.2 %    Comment: Performed at [facility], [place] [address] [place], [place] [address]  Glucose, capillary     Status: Abnormal   Collection Time: 07/15/18  6:33 AM  Result Value Ref Range   Glucose-Capillary 191 (H) 70 - 99 mg/dL  Comprehensive metabolic panel     Status: Abnormal   Collection Time: 07/15/18  7:42 AM  Result Value Ref Range   Sodium 139 135 - 145 mmol/L   Potassium 3.8 3.5 - 5.1 mmol/L   Chloride 106 98 - 111 mmol/L   CO2 22 22 - 32 mmol/L   Glucose, Bld 204 (H) 70 - 99 mg/dL   BUN 9 8 - 23 mg/dL   Creatinine, Ser 0.99 0.61 - 1.24 mg/dL   Calcium 9.0 8.9 - 10.3 mg/dL   Total Protein 6.6 6.5 - 8.1 g/dL    Albumin 3.3 (L) 3.5 - 5.0 g/dL   AST 17 15 - 41 U/L   ALT 22 0 - 44 U/L   Alkaline Phosphatase 41 38 - 126 U/L   Total Bilirubin 1.1 0.3 - 1.2 mg/dL   GFR calc non Af Amer >60 >60 mL/min   GFR calc Af Amer >60 >60 mL/min   Anion gap 11 5 - 15    Comment: Performed at [facility], [place] [address] [place], [place] [address]  Magnesium     Status: Abnormal   Collection Time: 07/15/18  7:42 AM  Result Value Ref Range   Magnesium 1.6 (L) 1.7 - 2.4 mg/dL    Comment: Performed at [place] [address] [place], [place] [address]  Phosphorus     Status: None   Collection Time: 07/15/18  7:42 AM  Result Value Ref Range   Phosphorus 3.5 2.5 - 4.6 mg/dL    Comment: Performed at [facility] [address], [place], [place] [address]  Glucose, capillary     Status: Abnormal   Collection Time: 07/15/18 11:14 AM  Result Value Ref Range   Glucose-Capillary 201 (H) 70 - 99 mg/dL   Comment 1 Notify RN    Comment 2 Document in Chart   Glucose, capillary     Status: Abnormal   Collection Time: 07/15/18  4:39 PM  Result Value Ref Range   Glucose-Capillary 135 (H) 70 - 99 mg/dL   Comment 1 Notify RN    Comment 2 Document in Chart   Glucose, capillary     Status: Abnormal   Collection Time: 07/15/18  9:20 PM  Result Value Ref Range   Glucose-Capillary 138 (H) 70 - 99 mg/dL   Comment 1 Notify RN    Comment 2  Document in Chart   CBC with Differential/Platelet     Status: None   Collection Time: 07/16/18  4:18 AM  Result Value Ref Range   WBC 7.1 4.0 - 10.5 K/uL   RBC 5.07 4.22 - 5.81 MIL/uL   Hemoglobin 15.5 13.0 - 17.0 g/dL   HCT 43.6 39.0 - 52.0 %   MCV 86.0 80.0 - 100.0 fL   MCH 30.6 26.0 - 34.0 pg   MCHC 35.6 30.0 - 36.0 g/dL   RDW 12.5 11.5 - 15.5 %   Platelets 190 150 - 400 K/uL   nRBC 0.0 0.0 - 0.2 %   Neutrophils Relative % 58 %   Neutro Abs 4.2 1.7 - 7.7 K/uL   Lymphocytes Relative 24 %   Lymphs Abs 1.7 0.7 - 4.0 K/uL   Monocytes Relative 13 %    Monocytes Absolute 0.9 0.1 - 1.0 K/uL   Eosinophils Relative 3 %   Eosinophils Absolute 0.2 0.0 - 0.5 K/uL   Basophils Relative 1 %   Basophils Absolute 0.1 0.0 - 0.1 K/uL   Immature Granulocytes 1 %   Abs Immature Granulocytes 0.04 0.00 - 0.07 K/uL    Comment: Performed at [facility], [address], [place], [place] [address]  Comprehensive metabolic panel     Status: Abnormal   Collection Time: 07/16/18  4:18 AM  Result Value Ref Range   Sodium 138 135 - 145 mmol/L   Potassium 4.2 3.5 - 5.1 mmol/L    Comment: SPECIMEN HEMOLYZED. HEMOLYSIS MAY AFFECT INTEGRITY OF RESULTS.   Chloride 107 98 - 111 mmol/L   CO2 22 22 - 32 mmol/L   Glucose, Bld 111 (H) 70 - 99 mg/dL   BUN 13 8 - 23 mg/dL   Creatinine, Ser 1.04 0.61 - 1.24 mg/dL   Calcium 8.9 8.9 - 10.3 mg/dL   Total Protein 6.2 (L) 6.5 - 8.1 g/dL   Albumin 3.3 (L) 3.5 - 5.0 g/dL   AST 33 15 - 41 U/L   ALT 19 0 - 44 U/L   Alkaline Phosphatase 38 38 - 126 U/L   Total Bilirubin 2.0 (H) 0.3 - 1.2 mg/dL   GFR calc non Af Amer >60 >60 mL/min   GFR calc Af Amer >60 >60 mL/min   Anion gap 9 5 - 15    Comment: Performed at [facility] [address], [place], [place] [address]  Phosphorus     Status: None   Collection Time: 07/16/18  4:18 AM  Result Value Ref Range   Phosphorus 3.9 2.5 - 4.6 mg/dL    Comment: Performed at [facility] [address], [place], [place] [address]  Glucose, capillary     Status: Abnormal   Collection Time: 07/16/18  6:55 AM  Result Value Ref Range   Glucose-Capillary 124 (H) 70 - 99 mg/dL   Comment 1 Notify RN    Comment 2 Document in Chart   Glucose, capillary     Status: Abnormal   Collection Time: 07/16/18 11:09 AM  Result Value Ref Range   Glucose-Capillary 186 (H) 70 - 99 mg/dL   No results found.     Medical Problem List and Plan: 1.  Functional deficits and left hemiparesis secondary to right pontine (as well as small left parietal) embolic infarcts  -admit to  inpatient rehab 2.  DVT Prophylaxis/Anticoagulation: Pharmaceutical: Other (comment) eliquis 3. Pain Management: tylenol 4. Mood: team to provide ego support. Pt appears motivated  and positive on exam today 5. Neuropsych: This patient is capable of making decisions on his own behalf. 6. Skin/Wound Care: encourage appropriate nutrition. Local care as needed 7. Fluids/Electrolytes/Nutrition: encourage PO, follow up labs on admit 8. A fib with RVR: eliquis for stroke proph  -lopressor 50mg  bid for rate control  -outpt follow up with cardiology 9. CAD with history of CABG:  -eliquis  -beta blocker, repatha 10. Allow permissive HTN, treat only  bp > 220/120 with gradual reduction    Post Admission Physician Evaluation: 1. Functional deficits secondary  to right pontine infarct. 2. Patient is admitted to receive collaborative, interdisciplinary care between the physiatrist, rehab nursing staff, and therapy team. 3. Patient's level of medical complexity and substantial therapy needs in context of that medical necessity cannot be provided at a lesser intensity of care such as a SNF. 4. Patient has experienced substantial functional loss from his/her baseline which was documented above under the "Functional History" and "Functional Status" headings.  Judging by the patient's diagnosis, physical exam, and functional history, the patient has potential for functional progress which will result in measurable gains while on inpatient rehab.  These gains will be of substantial and practical use upon discharge  in facilitating mobility and self-care at the household level. 5. Physiatrist will provide 24 hour management of medical needs as well as oversight of the therapy plan/treatment and provide guidance as appropriate regarding the interaction of the two. 6. The Preadmission Screening has been reviewed and patient status is unchanged unless otherwise stated above. 7. 24 hour rehab nursing will assist with  bladder management, bowel management, safety, skin/wound care, disease management, medication administration, pain management and patient education  and help integrate therapy concepts, techniques,education, etc. 8. PT will assess and treat for/with: Lower extremity strength, range of motion, stamina, balance, functional mobility, safety, adaptive techniques and equipment, NMR.   Goals are: mod I to supervision. 9. OT will assess and treat for/with: ADL's, functional mobility, safety, upper extremity strength, adaptive techniques and equipment, NMR, .   Goals are: mod I to supervision. Therapy may proceed with showering this patient. 10. SLP will assess and treat for/with: n/a.  Goals are: n/a. 11. Case Management and Social Worker will assess and treat for psychological issues and discharge planning. 12. Team conference will be held weekly to assess progress toward goals and to determine barriers to discharge. 13. Patient will receive at least 3 hours of therapy per day at least 5 days per week. 14. ELOS: 5-7 days       15. Prognosis:  excellent   I have personally performed a face to face diagnostic evaluation of this patient and formulated the key components of the plan.  Additionally, I have personally reviewed laboratory data, imaging studies, as well as relevant notes and concur with the physician assistant's documentation above.  Meredith Staggers, MD, [facility]    Bary Leriche, PA-C 07/16/2018

## 2018-07-16 NOTE — Care Management Important Message (Signed)
Important Message  Patient Details  Name: Andre Wolfe MRN: [id number] Date of Birth: 10/20/1934   Medicare Important Message Given:  Yes    [name] 07/16/2018, 2:57 PM

## 2018-07-16 NOTE — Discharge Instructions (Signed)

## 2018-07-16 NOTE — PMR Pre-admission (Signed)
PMR Admission Coordinator Pre-Admission Assessment  Patient: Andre Wolfe is an 84 y.o., male MRN: [id number] DOB: [birth year] Height: 5\' 9"  (175.3 cm) Weight: 96.2 kg              Insurance Information HMO: No   PPO:       PCP:       IPA:       80/20:       OTHER:   PRIMARY:  Medicare A and B      Policy#: [id number]      Subscriber: patient CM Name:        Phone#:       Fax#:   Pre-Cert#:        Employer: Retired Benefits:  Phone #:       Name: Checked in [place]. Date: 02/12/02     Deduct: $1408      Out of Pocket Max: None      Life Max: N/A CIR: 1005      SNF: 100 days Outpatient: 80%     Co-Pay: 20% Home Health: 100%      Co-Pay: none DME: 80%     Co-Pay: 20% Providers: patient's choice  SECONDARY: Generic [employer]      Policy#: [id number]      Subscriber: patient CM Name:        Phone#:       Fax#:   Pre-Cert#:        Employer: Retired Benefits:  Phone #:  [phone]     Name:   Eff. Date:       Deduct:        Out of Pocket Max:        Life Max:   CIR:        SNF:   Outpatient:       Co-Pay:   Home Health:        Co-Pay:   DME:       Co-Pay:    Medicaid Application Date:       Case Manager:  Disability Application Date:       Case Worker:   Emergency Contact Information Contact Information    Name Relation Home Work [place], [place] Daughter   [phone]   Darcey Nora Relative   [phone]     Current Medical History  Patient Admitting Diagnosis:Right pontine infarction    History of Present Illness: An 84 year old male with history of HTN, CAD, T2DM, peripheral neuropathy, TIAs, PAF s/p DCCV and eliquis d/c recently;  who has had declin in mobility, weakness with unsteady gait, increased falls and slurred speech that started on 12/25.  UDS negative he was admitted on 07/13/2018 with left-sided weakness with tendency to drag left lower extremity.  CT head done revealing remote left occipital lobe infarct and age indeterminate right pontine  infarct.  MRI/MRA brain done revealing right paramedian pontine acute/early subacute infarct with punctate infarct in left parietal cortex, mild chronic microvascular ischemic changes and no large vessel occlusion or aneurysm.  Patient noted to have A. fib at admission and was started on IV heparin. 2D echo showed EF 60 to 65% with no wall abnormality and grade 2 diastolic dysfunction with calcified mitral valve.  Carotid Doppler showed no significant ICA stenosis and greater than 50% ECA stenosis.  Dr. Martinique consulted for input and recommended Eliquis twice daily as well as addition of Repatha for dyslipidemia.  Developed A. fib with RVR yesterday and has converted to  NSR and cardiology recommends not to increase beta-blocker due to resting bradycardia.  Therapy evaluations done revealing revealed mild dysarthria with deficits in attention recall, poor insight with lack of safety awareness, left sided weakness with balance deficits affecting functional status.   CIR recommended for follow-up therapy.  Complete NIHSS TOTAL: 1  Past Medical History  Past Medical History:  Diagnosis Date  . Arrhythmia    post op Afib.  . Arthritis    "joints" (07/13/2018)  . Coronary artery disease    a. s/p CABG 12/2015.  Marland Kitchen CVA (cerebral vascular accident) ([facility]) 07/13/2018   right pontine infarct; "said he's had one in the past; probably 2 yrs ago; same symptoms" (07/13/2018)  . Diabetic peripheral neuropathy ([facility])   . GERD (gastroesophageal reflux disease)   . Hyperlipidemia   . Hypertension   . Postoperative atrial fibrillation ([place])   . Prostate cancer ([facility])    d'x 2015/2016  . TIA (transient ischemic attack)   . Type II diabetes mellitus ([facility])     Family History  family history includes Breast cancer in his mother and sister; Diabetes in his brother; Heart disease in his mother; Leukemia in his father.  Prior Rehab/Hospitalizations: No previous rehab  Has the patient had major surgery during 100  days prior to admission? No  Current Medications   Current Facility-Administered Medications:  .  acetaminophen (TYLENOL) tablet 650 mg, 650 mg, Oral, Q4H PRN **OR** acetaminophen (TYLENOL) solution 650 mg, 650 mg, Per Tube, Q4H PRN **OR** acetaminophen (TYLENOL) suppository 650 mg, 650 mg, Rectal, Q4H PRN, Karmen Bongo, MD .  apixaban Arne Cleveland) tablet 5 mg, 5 mg, Oral, BID, Kroeger, Krista M., PA-C, 5 mg at 07/16/18 0912 .  gabapentin (NEURONTIN) tablet 600 mg, 600 mg, Oral, QHS, Karmen Bongo, MD, 600 mg at 07/15/18 2037 .  insulin aspart (novoLOG) injection 0-15 Units, 0-15 Units, Subcutaneous, TID WC, Karmen Bongo, MD, 3 Units at 07/16/18 1306 .  insulin aspart (novoLOG) injection 0-5 Units, 0-5 Units, Subcutaneous, QHS, Karmen Bongo, MD, 2 Units at 07/13/18 2119 .  insulin aspart protamine- aspart (NOVOLOG MIX 70/30) injection 25 Units, 25 Units, Subcutaneous, BID WC, Karmen Bongo, MD, 25 Units at 07/16/18 0912 .  insulin aspart protamine- aspart (NOVOLOG MIX 70/30) injection 5 Units, 5 Units, Subcutaneous, Q1200, Karmen Bongo, MD, 5 Units at 07/16/18 1306 .  metoprolol tartrate (LOPRESSOR) tablet 50 mg, 50 mg, Oral, BID, Martinique, Peter M, MD, 50 mg at 07/16/18 1037 .  pantoprazole (PROTONIX) EC tablet 40 mg, 40 mg, Oral, Daily, Karmen Bongo, MD, 40 mg at 07/16/18 0913 .  phenol (CHLORASEPTIC) mouth spray 1 spray, 1 spray, Mouth/Throat, PRN, Sheikh, Omair Latif, DO .  senna-docusate (Senokot-S) tablet 1 tablet, 1 tablet, Oral, QHS PRN, Karmen Bongo, MD  Patients Current Diet:  Diet Order            Diet - low sodium heart healthy        Diet heart healthy/carb modified Room service appropriate? Yes; Fluid consistency: Thin  Diet effective now              Precautions / Restrictions Precautions Precautions: Fall Restrictions Weight Bearing Restrictions: No   Has the patient had 2 or more falls or a fall with injury in the past year?No.  Patient reports 1  fall this past year.  Prior Activity Level Limited Community (1-2x/wk): Went out 2 X a week, wife drives.  He has a lift chair to assist him at home.  Home Assistive Devices /  Equipment Home Assistive Devices/Equipment: Environmental consultant (specify type), Wheelchair, [employer] (specify quad or straight), CBG Meter, Grab bars in shower, Eyeglasses, Dentures (specify type) Home Equipment: Walker - 2 wheels, Cane - single point, Bedside commode, Shower seat, Grab bars - tub/shower, Wheelchair - manual  Prior Device Use: Indicate devices/aids used by the patient prior to current illness, exacerbation or injury? Walker and [employer]  Prior Functional Level Prior Function Level of Independence: Independent with assistive device(s) Comments: Just prior to coming to the hospital was using a RW; @ 2 months ago was using a cane in the community only; went shopping with his wife - used grocery cart; does not drive  Self Care: Did the patient need help bathing, dressing, using the toilet or eating?  Independent  Indoor Mobility: Did the patient need assistance with walking from room to room (with or without device)? Independent  Stairs: Did the patient need assistance with internal or external stairs (with or without device)? Independent  Functional Cognition: Did the patient need help planning regular tasks such as shopping or remembering to take medications? Independent  Current Functional Level Cognition  Arousal/Alertness: Awake/alert Overall Cognitive Status: Impaired/Different from baseline Orientation Level: Oriented X4 Safety/Judgement: Decreased awareness of safety, Decreased awareness of deficits Attention: Alternating Alternating Attention: Impaired Alternating Attention Impairment: Verbal basic Memory: Impaired Memory Impairment: Retrieval deficit Awareness: Impaired Awareness Impairment: Emergent impairment Safety/Judgment: Impaired    Extremity Assessment (includes Sensation/Coordination)  Upper  Extremity Assessment: Defer to OT evaluation LUE Deficits / Details: generalized weakness; moving WFL AROM with exception of shoulder flexion limited to @ 100. Poor inhand manipulation skills; strength @ 3+/5 throughout although stronger proxmially LUE Coordination: decreased fine motor, decreased gross motor  Lower Extremity Assessment: LLE deficits/detail, RLE deficits/detail RLE Deficits / Details: AROM WFL, strength hip flexion 4+/5, knee extension 4+/5 RLE Sensation: history of peripheral neuropathy LLE Deficits / Details: AROM WFL, strength hip flexion 4/5, knee extension 4-/5 LLE Sensation: history of peripheral neuropathy    ADLs  Overall ADL's : Needs assistance/impaired Eating/Feeding: Set up Grooming: Minimal assistance, Standing Grooming Details (indicate cue type and reason): Appeasr to demonstrate motor impersistance in standing LLE; "melts down" Upper Body Bathing: Minimal assistance, Sitting Lower Body Bathing: Minimal assistance, Sit to/from stand Upper Body Dressing : Minimal assistance, Sitting Lower Body Dressing: Moderate assistance, Sit to/from stand Toilet Transfer: Minimal assistance, Ambulation, Comfort height toilet, Grab bars Toileting- Clothing Manipulation and Hygiene: Minimal assistance, Sit to/from stand Toileting - Clothing Manipulation Details (indicate cue type and reason): vc to clean thoroughly Functional mobility during ADLs: Minimal assistance, Rolling walker, Cueing for safety, Cueing for sequencing General ADL Comments: Problem solving appeasr slow; decreased awareness at times; unaware of "draggin LLE when walking; trying to step at times when LLE not fully underneath him; posteiror lean whe attempting to donn pants; appesr unaware that he is leaning posteriorly    Mobility  Overal bed mobility: Needs Assistance Bed Mobility: Supine to Sit Supine to sit: Min assist, HOB elevated General bed mobility comments: to lift trunk, to supine assist for  positioning    Transfers  Overall transfer level: Needs assistance Equipment used: Rolling walker (2 wheeled) Transfers: Sit to/from Stand Sit to Stand: Min assist General transfer comment: cues to push up from bed, sat on bed with uncontrolled descent despite cues to reach back    Ambulation / Gait / Stairs / Wheelchair Mobility  Ambulation/Gait Ambulation/Gait assistance: Herbalist (Feet): 200 Feet Assistive device: Rolling walker (2 wheeled) Gait Pattern/deviations: Step-to  pattern, Step-through pattern, Decreased stride length, Decreased dorsiflexion - left, Shuffle General Gait Details: over time with L LE dragging on floor and increased assist on turns with cues to pick up foot and to stay inside walker    Posture / Balance Dynamic Sitting Balance Sitting balance - Comments: static balance okay, when testing coordination in sitting, LOB posterior and to L Balance Overall balance assessment: Needs assistance, History of Falls Sitting balance-Leahy Scale: Fair Sitting balance - Comments: static balance okay, when testing coordination in sitting, LOB posterior and to L Standing balance-Leahy Scale: Poor Standing balance comment: needs UE support for balance    Special needs/care consideration BiPAP/CPAP No CPM No Continuous Drip IV No Dialysis No    Life Vest No Oxygen No Special Bed No Trach Size No Wound Vac (area) No   Skin No                       Bowel mgmt: Last BM 07/15/18 per patient; last documented BM 07/14/18 Bladder mgmt: Voiding in urinal Diabetic mgmt Yes, on insulin at home    Previous Home Environment Living Arrangements: Spouse/significant other  Lives With: Spouse Available Help at Discharge: Family, Available 24 hours/day Type of Home: House Home Layout: One level Home Access: Level entry, Ramped entrance Bathroom Shower/Tub: Multimedia programmer: Standard Bathroom Accessibility: No Home Care Services: No  Discharge  Living Setting Plans for Discharge Living Setting: Patient's home, House, Lives with (comment)(Lives with wife.) Type of Home at Discharge: House Discharge Home Layout: One level Discharge Home Access: Ramped entrance Discharge Bathroom Shower/Tub: Walk-in shower, Door Discharge Bathroom Toilet: Standard Discharge Bathroom Accessibility: Yes How Accessible: Accessible via walker, Other (comment)(Would have to turn walker sideways.) Does the patient have any problems obtaining your medications?: No  Social/Family/Support Systems Patient Roles: Spouse, Parent(Has a wife, dtr close by, son in [place]) Contact Information: Cheryln Manly - daughter Anticipated Caregiver: wife and daughter Anticipated Caregiver's Contact Information: [name] - daughter - [phone] Ability/Limitations of Caregiver: Wife can assist, daughter is close by Caregiver Availability: 24/7 Discharge Plan Discussed with Primary Caregiver: Yes Is Caregiver In Agreement with Plan?: Yes Does Caregiver/Family have Issues with Lodging/Transportation while Pt is in Rehab?: No  Goals/Additional Needs Patient/Family Goal for Rehab: PT/OT mod I and supervision goals Expected length of stay: 5-7 days Cultural Considerations: None Dietary Needs: Heart healthy, carb mod, thin liquids Equipment Needs: TBD Pt/Family Agrees to Admission and willing to participate: Yes Program Orientation Provided & Reviewed with Pt/Caregiver Including Roles  & Responsibilities: Yes  Decrease burden of Care through IP rehab admission: N/A  Possible need for SNF placement upon discharge: Not planned  Patient Condition: This patient's medical and functional status has changed since the consult dated: 07/14/18 in which the Rehabilitation Physician determined and documented that the patient's condition is appropriate for intensive rehabilitative care in an inpatient rehabilitation facility. See "History of Present Illness" (above) for medical update.  Functional changes are: Currently requiring min assist to ambulate 200 feet RW. Patient's medical and functional status update has been discussed with the Rehabilitation physician and patient remains appropriate for inpatient rehabilitation. Will admit to inpatient rehab today.  Preadmission Screen Completed By:  Retta Diones, 07/16/2018 1:32 PM ______________________________________________________________________   Discussed status with Dr. Naaman Plummer on 07/16/18 at 1330 and received telephone approval for admission today.  Admission Coordinator:  Retta Diones, time 1331/Date 07/16/18

## 2018-07-16 NOTE — Discharge Summary (Signed)
Physician Discharge Summary  Andre Wolfe QMG:[id number] DOB: 06/05/1935 DOA: 07/13/2018  PCP: [name], MD  Admit date: 07/13/2018 Discharge date: 07/16/2018  Admitted From: Home Disposition: CIR  Recommendations for Outpatient Follow-up:  1. Follow up with PCP in 1-2 weeks 2. Follow up with Stroke Clinic NP at [facility] in 4 weeks 3. Follow up with Cardiology Dr. Martinique in 2 weeks  4. Please obtain CMP/CBC, Mag, Phos in one week 5. Please follow up on the following pending results:  Home Health: No Equipment/Devices: Conservation officer, nature with 5" Wheels   Discharge Condition: Stable  CODE STATUS: FULL CODE  Diet recommendation: Heart Healthy Carb Modified Diet  Brief/Interim Summary: HPI per Dr. Harold Hedge an 84 y.o.malewith medical history significant ofHTN; HLD; CAD s/p CABG; prostate CA; post-op afib; and DM presenting with left leg weakness.On Christmas Day, his family noticed a change with walking, talking, weakness, increased falls. He was using a walker recently but lately he can't walk at the grocery or [employer]. Now he just tires too easily. Left facial droop. +slurred speech. No dysphagia - although his wife reported some coughing with eating; he does not think that is an issue. He has not noticed weakness of one arm or leg - he was dragging his left leg last night and he couldn't tell.  ED Course:Left-sided weakness since Christmas Day. Appears to have a completed R pontine stroke. MRI pending. Neurology consulted.  **He was worked up for his CVA and  Neurology recommending placing the patient back on Eliquis and Cardiology agreeing.  Since the patient actually has atrial fibrillation there is no indication for TEE and loop recorder and tomorrow's TEE will be canceled.  Dr. Martinique recommendied watching the patient overnight and overnight he converted back to NSR.  He is deemed medically stable for discharge and will be discharged to CIR once bed is  available  Discharge Diagnoses:  Active Problems:   Right pontine CVA (HCC)   Acute ischemic stroke [facility])   Essential hypertension   Poorly controlled type 2 diabetes mellitus with peripheral neuropathy (HCC)   Tobacco abuse   PAF (paroxysmal atrial fibrillation) (HCC)  Acute Right Pontine CVA as well as small Cortical Infarct -In the Setting of Atrial Fibrillation and felt to be Embolic  -Admitted to Stroke Floor -Neurology Stroke Team Following and appreciate Further Recommendations and evaluation -Patient presenting with left leg weakness and left facial droop starting on 12/25 - concerning for completed CVA -CT/MRI confirmed acute/subacute pontine CVA as well as left parietal cortex infarct -Admitted forfurtherCVA evaluation -C/w Telemetry monitoring -Preliminiary Carotid dopplers showed Right Carotid: Velocities in the right ICA are consistent with a 1-39% stenosis. The ECA appears >50% stenosed. Left Carotid: Velocities in the left ICA are consistent with a 1-39% stenosis. The ECA appears >50% stenosed. Vertebrals: Bilateral vertebral arteries demonstrate antegrade flow. -ECHOCardiogram showed EF of 60-65% and Features are consistent with a pseudonormal left ventricular filling pattern, with concomitant abnormal relaxation and increased filling pressure (grade 2 diastolic dysfunction). Acoustic contrast opacification revealed no evidence of thrombus. The Ventricular Septum showed Septal motion showed paradox. - Mitral valve: Calcified annulus. Valve area by pressure half-time: 2.32 cm^2. -Placed on Anticoagulation with Heparin gtt and transitioned to Apixaban 5 mg po BID -Risk stratification with FLP, A1c as below  -UDS was Negative and TSH was 2.482 -No Indication for TEE and Loop Recorder now that A Fib has been convfirmed -ASA 81 mg daily now stopped by Cardiology due to  starting Apixaban -Neurology consulted for further recommendations  -PT/OT/ST/Nutrition  Consults -Rehabconsult for CIRplacement; Dr. Delice Lesch stating await completion of Therapy Evalus and will consider CIR if patient is far from baseline and able to tolerate 3 hours of Therapy per day after completion of medical workup -PT/OT recommending CIR with Rolling Walker with 5" Wheels -Follow up with Stroke Clinic NP at [facility] in 4 weeks  -Stable to D/C to CIR once bed is available  Paroxysmal Atrial Fibrillation/A Fib with RVR now in NSR -Likely the source of Stroke -Patient was seen by Dr. Martinique in 9/19 and taken off Eliquis but this is resumed now -At the time, the patient had only had 1 episode of short-term afib which was post-operative in nature and so d/c of AC was reasonable -However, he appears to have PAF given his multiple infarcts and would likely benefit from [facility] -Heparin changed to Eliquis 5 mg po BID and Metoprolol started for Rate Control -Cardiology evaluated and recommending observing yesterday and if Rate controlled can be D/C'd to CIR thisAM  -Now he has converted to NSR on Tele -Cardiology recommending continuing Apixaban and Metoprolol Tartrate 50 mg po BID -Follow up with Dr. Martinique as an outpatient in 2 weeks  HTN -Allow permissive HTNfor now -Treat BP only if >220/120, and then with goal of 15% reduction -HeldARB, CCB and plan to restart slowly at D/C and gradually normalize -Long Term BP goal is Normotensive   HLD -He has had difficulty with statins in the past (myalgias) -I do not see a record of lipid testing in Epic including [place]; however, his last note from Dr. Martinique reports LDL decreased from 141 to 112 on Zetia but his goal is <70 and so he was referred to PharmD for PCSK 9 inhibitor (Repatha); he has not yet started this medication -Panel done and showed total cholesterol ratio of 7.0, cholesterol level of 211, HDL of 30, LDL of 153, triglycerides of 139, VLDL of 28 -Pravastatin would be another consideration since its hydrophilic  properties may make it better tolerated -But for now will continue   DM -Checked A1c and was 8.9 -Continue with Home 70/30 Novolog Mix 5 units sq Daily at 1200 and 25 units sq BID w/ Meals at 1700 -C/w Moderate-scale SSI -CBG's ranging from 165-201  Obesity -Estimated body mass index is 31.31 kg/m as calculated from the following:   Height as of this encounter: 5\' 9"  (1.753 m).   Weight as of this encounter: 96.2 kg. -Weight Loss Counseling Given   CAD s/p CABG -CABG done in 2017 -ASA 81 mg po Daily discontinued by Cardiology -Currently stable and no Angina   GERD -C/w Pantoprazole 40 mg po Daily   Discharge Instructions  Discharge Instructions    Ambulatory referral to Neurology   Complete by:  As directed    Follow up with stroke clinic NP (Jessica Vanschaick or Cecille Rubin, if both not available, consider Zachery Dauer, or A[place]) at [facility] in about 4 weeks. Thanks.   Call MD for:  difficulty breathing, headache or visual disturbances   Complete by:  As directed    Call MD for:  extreme fatigue   Complete by:  As directed    Call MD for:  hives   Complete by:  As directed    Call MD for:  persistant dizziness or light-headedness   Complete by:  As directed    Call MD for:  persistant nausea and vomiting   Complete by:  As directed    Call MD for:  redness, tenderness, or signs of infection (pain, swelling, redness, odor or green/yellow discharge around incision site)   Complete by:  As directed    Call MD for:  severe uncontrolled pain   Complete by:  As directed    Call MD for:  temperature >100.4   Complete by:  As directed    Diet - low sodium heart healthy   Complete by:  As directed    Discharge instructions   Complete by:  As directed    You were cared for by a hospitalist during your hospital stay. If you have any questions about your discharge medications or the care you received while you were in the hospital after you are discharged, you can call  the unit and ask to speak with the hospitalist on call if the hospitalist that took care of you is not available. Once you are discharged, your primary care physician will handle any further medical issues. Please note that NO REFILLS for any discharge medications will be authorized once you are discharged, as it is imperative that you return to your primary care physician (or establish a relationship with a primary care physician if you do not have one) for your aftercare needs so that they can reassess your need for medications and monitor your lab values.  Follow up with PCP, Cardiology, and Neurology as an outpatient. Take all medications as prescribed. If symptoms change or worsen please return to the ED for evaluation   Increase activity slowly   Complete by:  As directed      Allergies as of 07/16/2018   No Known Allergies     Medication List    STOP taking these medications   aspirin EC 81 MG tablet   ibuprofen 200 MG tablet Commonly known as:  ADVIL,MOTRIN     TAKE these medications   amLODipine 2.5 MG tablet Commonly known as:  NORVASC Take 1 tablet (2.5 mg total) by mouth daily.   apixaban 5 MG Tabs tablet Commonly known as:  ELIQUIS Take 1 tablet (5 mg total) by mouth 2 (two) times daily.   [employer] OMEPRAZOLE PO Take 20 mg by mouth 4 (four) times daily.   Evolocumab 140 MG/ML Soaj Commonly known as:  REPATHA SURECLICK Inject 165 mg into the skin every 14 (fourteen) days.   gabapentin 600 MG tablet Commonly known as:  NEURONTIN Take 600 mg by mouth at bedtime.   losartan 100 MG tablet Commonly known as:  COZAAR Take 0.5 tablets (50 mg total) by mouth daily. What changed:  how much to take   metoprolol tartrate 50 MG tablet Commonly known as:  LOPRESSOR Take 1 tablet (50 mg total) by mouth 2 (two) times daily. What changed:    medication strength  how much to take   NOVOLIN 70/30 FLEXPEN (70-30) 100 UNIT/ML PEN Generic drug:  Insulin Isophane & Regular  Human Inject 5-25 Units into the skin 3 (three) times daily. Taking 25 units in the Am and 5 units Midday, and 25 units before dinner.   senna-docusate 8.6-50 MG tablet Commonly known as:  Senokot-S Take 1 tablet by mouth at bedtime as needed for mild constipation.      Follow-up Information    [facility]. Schedule an appointment as soon as possible for a visit in 4 week(s).   Specialty:  Neurology Contact information: [address] [place] [phone]  [place], [place] Follow up on 07/24/2018.   Specialties:  Cardiology, Radiology Why:  Please arrive 15 minutes early for your 11am post-hospital cardiology follow-up appointment Contact information: [address] [place] [address] [phone]          No Known Allergies  Consultations:  Neurology  Cardiology  Procedures/Studies: Ct Head Wo Contrast  Result Date: 07/13/2018 CLINICAL DATA:  Left leg weakness, left facial droop and slurred speech since 07/08/2018. EXAM: CT HEAD WITHOUT CONTRAST TECHNIQUE: Contiguous axial images were obtained from the base of the skull through the vertex without intravenous contrast. COMPARISON:  None. FINDINGS: Brain: The patient has an age indeterminate infarct in the right pons. Left occipital lobe infarct appears more remote but can not be definitively characterized. No mass, hemorrhage, midline shift or abnormal extra-axial fluid collection is identified. No hydrocephalus or pneumocephalus. Vascular: Atherosclerosis is noted. Skull: Intact.  No focal lesion. Sinuses/Orbits: Small mucous retention cyst or polyp left maxillary sinus noted. There is minimal mucosal thickening in the right maxillary sinus. Other: None. IMPRESSION: Right pontine infarct is age indeterminate. Left occipital lobe infarct appears more remote but also can not be definitively characterized. Atrophy and chronic microvascular ischemic  change. Atherosclerosis. Electronically Signed   By: Inge Rise M.D.   On: 07/13/2018 11:27   Mr Jodene Nam Head Wo Contrast  Result Date: 07/13/2018 CLINICAL DATA:  84 y/o M; left leg weakness, slurred speech, left facial droop 07/08/2018. EXAM: MRI HEAD WITHOUT CONTRAST MRA HEAD WITHOUT CONTRAST TECHNIQUE: Multiplanar, multiecho pulse sequences of the brain and surrounding structures were obtained without intravenous contrast. Angiographic images of the head were obtained using MRA technique without contrast. COMPARISON:  07/13/2018 CT head. FINDINGS: MRI HEAD FINDINGS Brain: Right paramedian pontine focus of reduced diffusion measuring 15 x 10 mm (AP by ML series 5, image 64) compatible with acute/early subacute infarction. Additional punctate focus of infarction within the left parietal cortex. No associated hemorrhage or mass effect. Several nonspecific T2 FLAIR hyperintensities in subcortical and periventricular white matter are compatible with mild chronic microvascular ischemic changes for age. Mild volume loss of the brain. No extra-axial collection, hydrocephalus, mass effect, or herniation. No abnormal susceptibility hypointensity to indicate intracranial hemorrhage. Vascular: Normal flow voids. Skull and upper cervical spine: Normal marrow signal. Sinuses/Orbits: Mild left maxillary sinus mucosal thickening. No additional abnormal signal of the visible paranasal sinuses and mastoid air cells. Bilateral intra-ocular lens replacement. Other: None. MRA HEAD FINDINGS Internal carotid arteries: Patent. Lumen irregularity of the internal carotid arteries compatible with atherosclerosis. Mild proximal left cavernous stenosis and mild-to-moderate right paraclinoid ICA stenosis. Anterior cerebral arteries: Patent. Large left A1 and anterior communicating artery, normal variant. Middle cerebral arteries: Patent. Mild non stenotic lumen irregularity of M1 segments compatible with atherosclerosis. Right M2  inferior division origin mild stenosis. Anterior communicating artery: Patent. Posterior communicating arteries:  Patent. Posterior cerebral arteries: Patent. Moderate left P1 stenosis. Mild distal right P2 stenosis. Basilar artery:  Patent.  Mild mid basilar stenosis. Vertebral arteries:  Patent. No aneurysm or large vessel occlusion. IMPRESSION: MRI head: 1. Right paramedian pons acute/early subacute infarction. Additional punctate infarction in left parietal cortex. No hemorrhage or mass effect. 2. Mild for age chronic microvascular ischemic changes and volume loss of the brain. MRA head: 1. No large vessel occlusion or aneurysm identified. 2. Intracranial atherosclerosis with multiple segments of mild-to-moderate stenosis in the anterior and posterior circulation. Electronically Signed   By: Kristine Garbe M.D.   On: 07/13/2018 13:45  Mr Brain 57 Contrast  Result Date: 07/13/2018 CLINICAL DATA:  84 y/o M; left leg weakness, slurred speech, left facial droop 07/08/2018. EXAM: MRI HEAD WITHOUT CONTRAST MRA HEAD WITHOUT CONTRAST TECHNIQUE: Multiplanar, multiecho pulse sequences of the brain and surrounding structures were obtained without intravenous contrast. Angiographic images of the head were obtained using MRA technique without contrast. COMPARISON:  07/13/2018 CT head. FINDINGS: MRI HEAD FINDINGS Brain: Right paramedian pontine focus of reduced diffusion measuring 15 x 10 mm (AP by ML series 5, image 64) compatible with acute/early subacute infarction. Additional punctate focus of infarction within the left parietal cortex. No associated hemorrhage or mass effect. Several nonspecific T2 FLAIR hyperintensities in subcortical and periventricular white matter are compatible with mild chronic microvascular ischemic changes for age. Mild volume loss of the brain. No extra-axial collection, hydrocephalus, mass effect, or herniation. No abnormal susceptibility hypointensity to indicate intracranial  hemorrhage. Vascular: Normal flow voids. Skull and upper cervical spine: Normal marrow signal. Sinuses/Orbits: Mild left maxillary sinus mucosal thickening. No additional abnormal signal of the visible paranasal sinuses and mastoid air cells. Bilateral intra-ocular lens replacement. Other: None. MRA HEAD FINDINGS Internal carotid arteries: Patent. Lumen irregularity of the internal carotid arteries compatible with atherosclerosis. Mild proximal left cavernous stenosis and mild-to-moderate right paraclinoid ICA stenosis. Anterior cerebral arteries: Patent. Large left A1 and anterior communicating artery, normal variant. Middle cerebral arteries: Patent. Mild non stenotic lumen irregularity of M1 segments compatible with atherosclerosis. Right M2 inferior division origin mild stenosis. Anterior communicating artery: Patent. Posterior communicating arteries:  Patent. Posterior cerebral arteries: Patent. Moderate left P1 stenosis. Mild distal right P2 stenosis. Basilar artery:  Patent.  Mild mid basilar stenosis. Vertebral arteries:  Patent. No aneurysm or large vessel occlusion. IMPRESSION: MRI head: 1. Right paramedian pons acute/early subacute infarction. Additional punctate infarction in left parietal cortex. No hemorrhage or mass effect. 2. Mild for age chronic microvascular ischemic changes and volume loss of the brain. MRA head: 1. No large vessel occlusion or aneurysm identified. 2. Intracranial atherosclerosis with multiple segments of mild-to-moderate stenosis in the anterior and posterior circulation. Electronically Signed   By: Kristine Garbe M.D.   On: 07/13/2018 13:45   Vas US Carotid (at [facility] Only)  Result Date: 07/14/2018 Carotid Arterial Duplex Study Indications: CVA. Performing Technologist: Abram Sander RVS  Examination Guidelines: A complete evaluation includes B-mode imaging, spectral Doppler, color Doppler, and power Doppler as needed of all accessible portions of each vessel.  Bilateral testing is considered an integral part of a complete examination. Limited examinations for reoccurring indications may be performed as noted.  Right Carotid Findings: +----------+--------+--------+--------+------------+--------+           PSV cm/sEDV cm/sStenosisDescribe    Comments +----------+--------+--------+--------+------------+--------+ CCA Prox  85      7               heterogenous         +----------+--------+--------+--------+------------+--------+ CCA Distal94      12              heterogenous         +----------+--------+--------+--------+------------+--------+ ICA Prox  77      13      1-39%   heterogenous         +----------+--------+--------+--------+------------+--------+ ICA Distal75      14                                   +----------+--------+--------+--------+------------+--------+  ECA       381                                          +----------+--------+--------+--------+------------+--------+ +----------+--------+-------+--------+-------------------+           PSV cm/sEDV cmsDescribeArm Pressure (mmHG) +----------+--------+-------+--------+-------------------+ Subclavian170                                        +----------+--------+-------+--------+-------------------+ +---------+--------+--+--------+--+---------+ VertebralPSV cm/s39EDV cm/s10Antegrade +---------+--------+--+--------+--+---------+  Left Carotid Findings: +----------+--------+--------+--------+------------+--------+           PSV cm/sEDV cm/sStenosisDescribe    Comments +----------+--------+--------+--------+------------+--------+ CCA Prox  113     12              heterogenous         +----------+--------+--------+--------+------------+--------+ CCA Distal105     14              heterogenous         +----------+--------+--------+--------+------------+--------+ ICA Prox  89      30      1-39%   heterogenous          +----------+--------+--------+--------+------------+--------+ ICA Distal96      27                                   +----------+--------+--------+--------+------------+--------+ ECA       494                                          +----------+--------+--------+--------+------------+--------+ +----------+--------+--------+--------+-------------------+ SubclavianPSV cm/sEDV cm/sDescribeArm Pressure (mmHG) +----------+--------+--------+--------+-------------------+           185                                         +----------+--------+--------+--------+-------------------+ +---------+--------+--+--------+-+---------+ VertebralPSV cm/s39EDV cm/s6Antegrade +---------+--------+--+--------+-+---------+  Summary: Right Carotid: Velocities in the right ICA are consistent with a 1-39% stenosis.                The ECA appears >50% stenosed. Left Carotid: Velocities in the left ICA are consistent with a 1-39% stenosis.               The ECA appears >50% stenosed. Vertebrals: Bilateral vertebral arteries demonstrate antegrade flow. *See table(s) above for measurements and observations.  Electronically signed by Servando Snare MD on 07/14/2018 at 5:06:19 PM.    Final    ECHOCARDIOGRAM ------------------------------------------------------------------- Study Conclusions  - Left ventricle: The cavity size was normal. Wall thickness was normal. Systolic function was normal. The estimated ejection fraction was in the range of 60% to 65%. Wall motion was normal; there were no regional wall motion abnormalities. Features are consistent with a pseudonormal left ventricular filling pattern, with concomitant abnormal relaxation and increased filling pressure (grade 2 diastolic dysfunction). Acoustic contrast opacification revealed no evidence ofthrombus. - Ventricular septum: Septal motion showed paradox. - Mitral valve: Calcified annulus. Valve area by  pressure half-time: 2.32 cm^2. - Left atrium: The atrium was mildly dilated.  VASCULAR US CAROTID Preliminary Results showed: Summary: Right Carotid: Velocities in the right ICA  are consistent with a 1-39% stenosis. The ECA appears >50% stenosed.  Left Carotid: Velocities in the left ICA are consistent with a 1-39% stenosis. The ECA appears >50% stenosed.  Vertebrals: Bilateral vertebral arteries demonstrate antegrade flow.  Subjective: Seen and examined at bedside was doing well.  Denying chest pain, lightheadedness or dizziness.  No nausea or vomiting.  Other concerns or complaints at this time and feeling well and ready go to inpatient rehabilitation.  Discharge Exam: Vitals:   07/16/18 0749 07/16/18 1106  BP: (!) 147/68 135/79  Pulse: (!) 55 (!) 51  Resp:    Temp: 98 F (36.7 C) 98.2 F (36.8 C)  SpO2: 99% 98%   Vitals:   07/16/18 0007 07/16/18 0405 07/16/18 0749 07/16/18 1106  BP: (!) 109/55 (!) 115/46 (!) 147/68 135/79  Pulse: 61 (!) 55 (!) 55 (!) 51  Resp: 18 18    Temp: 98.1 F (36.7 C) 98 F (36.7 C) 98 F (36.7 C) 98.2 F (36.8 C)  TempSrc: Oral Oral Oral Oral  SpO2: 99% 98% 99% 98%  Weight:      Height:       General: Pt is alert, awake, not in acute distress Cardiovascular: RRR but on the slower side, S1/S2 +, no rubs, no gallops Respiratory: Diminished bilaterally, no wheezing, no rhonchi Abdominal: Soft, NT, Distended due to body habitus, bowel sounds + Extremities: no LE edema, no cyanosis  The results of significant diagnostics from this hospitalization (including imaging, microbiology, ancillary and laboratory) are listed below for reference.    Microbiology: No results found for this or any previous visit (from the past 240 hour(s)).   Labs: BNP (last 3 results) No results for input(s): BNP in the last 8760 hours. Basic Metabolic Panel: Recent Labs  Lab 07/13/18 1000 07/14/18 1445 07/15/18 0742  07/16/18 0418  NA 138 138 139 138  K 4.2 3.5 3.8 4.2  CL 103 103 106 107  CO2 25 21* 22 22  GLUCOSE 281* 158* 204* 111*  BUN 12 15 9 13   CREATININE 1.03 0.94 0.99 1.04  CALCIUM 9.1 8.9 9.0 8.9  MG  --  1.7 1.6*  --   PHOS  --  3.7 3.5 3.9   Liver Function Tests: Recent Labs  Lab 07/14/18 1445 07/15/18 0742 07/16/18 0418  AST 18 17 33  ALT 23 22 19   ALKPHOS 38 41 38  BILITOT 0.7 1.1 2.0*  PROT 6.6 6.6 6.2*  ALBUMIN 3.5 3.3* 3.3*   No results for input(s): LIPASE, AMYLASE in the last 168 hours. No results for input(s): AMMONIA in the last 168 hours. CBC: Recent Labs  Lab 07/13/18 1000 07/14/18 0013 07/15/18 0439 07/16/18 0418  WBC 7.5 6.7 6.6 7.1  NEUTROABS  --   --   --  4.2  HGB 16.0 14.7 16.1 15.5  HCT 46.9 43.4 46.0 43.6  MCV 88.0 85.9 86.0 86.0  PLT 225 212 211 190   Cardiac Enzymes: No results for input(s): CKTOTAL, CKMB, CKMBINDEX, TROPONINI in the last 168 hours. BNP: Invalid input(s): POCBNP CBG: Recent Labs  Lab 07/15/18 0633 07/15/18 1114 07/15/18 1639 07/15/18 2120 07/16/18 0655  GLUCAP 191* 201* 135* 138* 124*   D-Dimer No results for input(s): DDIMER in the last 72 hours. Hgb A1c Recent Labs    07/14/18 0013  HGBA1C 8.9*   Lipid Profile Recent Labs    07/14/18 0013  CHOL 211*  HDL 30*  LDLCALC 153*  TRIG 139  CHOLHDL 7.0   Thyroid  function studies Recent Labs    07/14/18 0013  TSH 2.482   Anemia work up No results for input(s): VITAMINB12, FOLATE, FERRITIN, TIBC, IRON, RETICCTPCT in the last 72 hours. Urinalysis    Component Value Date/Time   COLORURINE YELLOW 07/13/2018 [place] 07/13/2018 1321   LABSPEC 1.028 07/13/2018 1321   PHURINE 5.0 07/13/2018 1321   GLUCOSEU >=500 (A) 07/13/2018 1321   HGBUR NEGATIVE 07/13/2018 1321   [place] 07/13/2018 1321   KETONESUR NEGATIVE 07/13/2018 1321   PROTEINUR 30 (A) 07/13/2018 1321   NITRITE NEGATIVE 07/13/2018 1321   LEUKOCYTESUR NEGATIVE  07/13/2018 1321   Sepsis Labs Invalid input(s): PROCALCITONIN,  WBC,  LACTICIDVEN Microbiology No results found for this or any previous visit (from the past 240 hour(s)).  Time coordinating discharge: 35 minutes  SIGNED:  Kerney Elbe, DO [facility] 07/16/2018, 11:52 AM Pager is on [place]  If 7PM-7AM, please contact night-coverage [email or web address] Password [email or web address]

## 2018-07-16 NOTE — Care Management Note (Signed)
Case Management Note  Patient Details  Name: Andre Wolfe MRN: [id number] Date of Birth: [birth year]  Subjective/Objective:   Pt admitted with a stroke. He is from home with spouse.                  Action/Plan: Pt discharging to CIR today. CM signing off.   Expected Discharge Date:  07/16/18               Expected Discharge Plan:  [place]  In-House Referral:     Discharge planning Services  CM Consult  Post Acute Care Choice:    Choice offered to:     DME Arranged:    DME Agency:     HH Arranged:    HH Agency:     Status of Service:  Completed, signed off  If discussed at [employer], dates discussed:    Additional Comments:  Pollie Friar, RN 07/16/2018, 12:19 PM

## 2018-07-16 NOTE — Telephone Encounter (Signed)
New message   Per Daleen Snook [facility] Appointment on 07/24/2018 at 11:00am with Almyra Deforest.

## 2018-07-16 NOTE — Progress Notes (Signed)
Physical Therapy Treatment Patient Details Name: Andre Wolfe MRN: [id number] DOB: [birth year] Today's Date: 07/16/2018    History of Present Illness 84 y.o. male with history of peripheral neuropathy, hypertension, hyperlipidemia, diabetes, tobacco abuse, and now atrial fibrillation.    As of December 24 he was deconditioned but on Christmas day they noticed a significant difference in his strength in his legs. MRI + R paramedian pons acute/subacute infarct; additional punctate infact L parietal cortex.    PT Comments    Pt making steady progress with functional mobility. Focus was on transfers this session. Plan is for pt to d/c to CIR later today. Will continue to follow acutely.    Follow Up Recommendations  CIR     Equipment Recommendations  Rolling walker with 5" wheels    Recommendations for Other Services       Precautions / Restrictions Precautions Precautions: Fall Restrictions Weight Bearing Restrictions: No    Mobility  Bed Mobility               General bed mobility comments: pt received sitting OOB in recliner chair upon arrival  Transfers Overall transfer level: Needs assistance Equipment used: Rolling walker (2 wheeled) Transfers: Sit to/from Stand Sit to Stand: Min assist;Mod assist         General transfer comment: pt performed x5 from recliner chair, progressing from needing mod A to min A; cueing for safe hand placement and use of momentum needed as well  Ambulation/Gait Ambulation/Gait assistance: Min guard Gait Distance (Feet): 40 Feet Assistive device: Rolling walker (2 wheeled) Gait Pattern/deviations: Step-to pattern;Step-through pattern;Decreased stride length;Decreased dorsiflexion - left;Shuffle Gait velocity: decreased   General Gait Details: pt with improved advancement of L LE during gait as compared to previous session, cueing to maintain a safe distance from RW needed; no LOB or need for physical assistance   Stairs              Wheelchair Mobility    Modified Rankin (Stroke Patients Only) Modified Rankin (Stroke Patients Only) Pre-Morbid Rankin Score: No symptoms Modified Rankin: Moderately severe disability     Balance Overall balance assessment: Needs assistance;History of Falls Sitting-balance support: Feet supported Sitting balance-Leahy Scale: Good     Standing balance support: Bilateral upper extremity supported Standing balance-Leahy Scale: Poor                              Cognition Arousal/Alertness: Awake/alert Behavior During Therapy: WFL for tasks assessed/performed Overall Cognitive Status: Impaired/Different from baseline Area of Impairment: Safety/judgement                         Safety/Judgement: Decreased awareness of safety;Decreased awareness of deficits            Exercises Other Exercises Other Exercises: 5x sit<>stand from recliner chair with min-mod A    General Comments        Pertinent Vitals/Pain Pain Assessment: No/denies pain    Home Living                      Prior Function            PT Goals (current goals can now be found in the care plan section) Acute Rehab PT Goals PT Goal Formulation: With patient Time For Goal Achievement: 07/21/18 Progress towards PT goals: Progressing toward goals    Frequency    Min 4X/week  PT Plan Current plan remains appropriate    Co-evaluation              AM-PAC PT "6 Clicks" Mobility   Outcome Measure  Help needed turning from your back to your side while in a flat bed without using bedrails?: A Little Help needed moving from lying on your back to sitting on the side of a flat bed without using bedrails?: A Little Help needed moving to and from a bed to a chair (including a wheelchair)?: A Little Help needed standing up from a chair using your arms (e.g., wheelchair or bedside chair)?: A Little Help needed to walk in hospital room?: A Little Help  needed climbing 3-5 steps with a railing? : A Lot 6 Click Score: 17    End of Session Equipment Utilized During Treatment: Gait belt Activity Tolerance: Patient tolerated treatment well Patient left: in chair;with call bell/phone within reach;with chair alarm set Nurse Communication: Mobility status PT Visit Diagnosis: History of falling (Z91.81);Other abnormalities of gait and mobility (R26.89);Hemiplegia and hemiparesis Hemiplegia - Right/Left: Left Hemiplegia - dominant/non-dominant: Non-dominant Hemiplegia - caused by: Cerebral infarction     Time: 8159-4707 PT Time Calculation (min) (ACUTE ONLY): 16 min  Charges:  $Therapeutic Activity: 8-22 mins                     Sherie Don, PT, DPT  Acute Rehabilitation Services Pager [phone] Office [place] 07/16/2018, 5:13 PM

## 2018-07-16 NOTE — Progress Notes (Signed)
Pt arrived to unit via bed with personal belongings.

## 2018-07-16 NOTE — Evaluation (Signed)
Speech Language Pathology Evaluation Patient Details Name: Andre Wolfe MRN: [id number] DOB: 1935-06-17 Today's Date: 07/16/2018 Time: 8921-1941 SLP Time Calculation (min) (ACUTE ONLY): 19 min  Problem List:  Patient Active Problem List   Diagnosis Date Noted  . Acute ischemic stroke ([place])   . Essential hypertension   . Poorly controlled type 2 diabetes mellitus with peripheral neuropathy ([place])   . Tobacco abuse   . PAF (paroxysmal atrial fibrillation) ([place])   . Right pontine CVA ([place]) 07/13/2018  . Hypercholesterolemia 05/04/2018   Past Medical History:  Past Medical History:  Diagnosis Date  . Arrhythmia    post op Afib.  . Arthritis    "joints" (07/13/2018)  . Coronary artery disease    a. s/p CABG 12/2015.  Marland Kitchen CVA (cerebral vascular accident) ([place]) 07/13/2018   right pontine infarct; "said he's had one in the past; probably 2 yrs ago; same symptoms" (07/13/2018)  . Diabetic peripheral neuropathy ([place])   . GERD (gastroesophageal reflux disease)   . Hyperlipidemia   . Hypertension   . Postoperative atrial fibrillation ([place])   . Prostate cancer ([place])    d'x 2015/2016  . TIA (transient ischemic attack)   . Type II diabetes mellitus ([place])    Past Surgical History:  Past Surgical History:  Procedure Laterality Date  . CARDIAC CATHETERIZATION  2017   "not completed; went straight to [facility]"  . CATARACT EXTRACTION W/ INTRAOCULAR LENS  IMPLANT, BILATERAL Bilateral   . CORONARY ARTERY BYPASS GRAFT  2017   "CABG X 4"  . TONSILLECTOMY     HPI:  84 y.o. male with history of peripheral neuropathy, hypertension, hyperlipidemia, diabetes, tobacco abuse, and now atrial fibrillation.    As of December 24 he was deconditioned but on Christmas day they noticed a significant difference in his strength in his legs. MRI + R paramedian pons acute/subacute infarct; additional punctate infact L parietal cortex.   Assessment / Plan / Recommendation Clinical Impression  Pt presents with a mild  dysarthria of speech with imprecision of consonant production; deficits in alternating attention, recall of task instructions, and insight.  Pt participatory, interactive, with good sense of humor.  Excellent family support.  Pt/dtr are hoping for D/C soon to CIR.  Recommend SLP f/u next venue of care to address the aforementioned deficits - pt agrees.     SLP Assessment  SLP Recommendation/Assessment: All further Speech Lanaguage Pathology  needs can be addressed in the next venue of care SLP Visit Diagnosis: Cognitive communication deficit (R41.841)    Follow Up Recommendations       Frequency and Duration           SLP Evaluation Cognition  Overall Cognitive Status: Impaired/Different from baseline Arousal/Alertness: Awake/alert Orientation Level: Oriented X4 Attention: Alternating Alternating Attention: Impaired Alternating Attention Impairment: Verbal basic Memory: Impaired Memory Impairment: Retrieval deficit Awareness: Impaired Awareness Impairment: Emergent impairment Safety/Judgment: Impaired       Comprehension  Auditory Comprehension Overall Auditory Comprehension: Appears within functional limits for tasks assessed Reading Comprehension Reading Status: Within funtional limits    Expression Expression Primary Mode of Expression: Verbal Verbal Expression Overall Verbal Expression: Appears within functional limits for tasks assessed Written Expression Dominant Hand: Right Written Expression: Not tested   Oral / Motor  Motor Speech Overall Motor Speech: Impaired Phonation: Normal Resonance: Within functional limits Articulation: Impaired Level of Impairment: Conversation Intelligibility: Intelligibility reduced Word: 75-100% accurate Phrase: 75-100% accurate Sentence: 75-100% accurate Conversation: 75-100% accurate   GO  Andre Wolfe 07/16/2018, 10:12 AM

## 2018-07-16 NOTE — H&P (Signed)
Physical Medicine and Rehabilitation Admission H&P     CC: Functional deficits due to stroke     HPI: Andre Wolfe is an 84 year old male with history of HTN, CAD, T2DM, peripheral neuropathy, TIAs, PAF s/p DCCV and eliquis d/c recently;  who has had declin in mobility, weakness with unsteady gait, increased falls and slurred speech that started on 12/25.  UDS negative he was admitted on 07/13/2018 with left-sided weakness with tendency to drag left lower extremity.  CT head done revealing remote left occipital lobe infarct and age indeterminate right pontine infarct.  MRI/MRA brain done revealing right paramedian pontine acute/early subacute infarct with punctate infarct in left parietal cortex, mild chronic microvascular ischemic changes and no large vessel occlusion or aneurysm.  Patient noted to have A. fib at admission and was started on IV heparin. 2D echo showed EF 60 to 65% with no wall abnormality and grade 2 diastolic dysfunction with calcified mitral valve.  Carotid Doppler showed no significant ICA stenosis and greater than 50% ECA stenosis.   Dr. Martinique consulted for input and recommended Eliquis twice daily as well as addition of Repatha for dyslipidemia.  Developed A. fib with RVR yesterday and has converted to NSR and cardiology recommends not to increase beta-blocker due to resting bradycardia.  Therapy evaluations done revealing revealed mild dysarthria with deficits in attention recall, poor insight with lack of safety awareness, left sided weakness with balance deficits affecting functional status.   CIR recommended for follow-up therapy.        ROS     Past Medical History:  Diagnosis Date  . Arrhythmia      post op Afib.  . Arthritis      "joints" (07/13/2018)  . Coronary artery disease      a. s/p CABG 12/2015.  Marland Kitchen CVA (cerebral vascular accident) ([place]) 07/13/2018    right pontine infarct; "said he's had one in the past; probably 2 yrs ago; same symptoms"  (07/13/2018)  . Diabetic peripheral neuropathy ([place])    . GERD (gastroesophageal reflux disease)    . Hyperlipidemia    . Hypertension    . Postoperative atrial fibrillation ([place])    . Prostate cancer ([place])      d'x 2015/2016  . TIA (transient ischemic attack)    . Type II diabetes mellitus ([place])             Past Surgical History:  Procedure Laterality Date  . CARDIAC CATHETERIZATION   2017    "not completed; went straight to [facility]"  . CATARACT EXTRACTION W/ INTRAOCULAR LENS  IMPLANT, BILATERAL Bilateral    . CORONARY ARTERY BYPASS GRAFT   2017    "CABG X 4"  . TONSILLECTOMY               Family History  Problem Relation Age of Onset  . Heart disease Mother    . Breast cancer Mother    . Leukemia Father    . Diabetes Brother    . Breast cancer Sister    . Stroke Neg Hx        Social History:  Married. Independent with walker PTA. He  reports that he has been smoking. He has a 7.80 pack-year smoking history. He has never used smokeless tobacco. He reports previous alcohol use. He reports that he does not use drugs.      Allergies: No Known Allergies            Medications Prior to  Admission  Medication Sig Dispense Refill  . amLODipine (NORVASC) 2.5 MG tablet Take 1 tablet (2.5 mg total) by mouth daily. 90 tablet 3  . aspirin EC 81 MG tablet Take 81 mg by mouth daily.      . [facility] OMEPRAZOLE PO Take 20 mg by mouth 4 (four) times daily.      Marland Kitchen gabapentin (NEURONTIN) 600 MG tablet Take 600 mg by mouth at bedtime.    1  . ibuprofen (ADVIL,MOTRIN) 200 MG tablet Take 600 mg by mouth every 6 (six) hours as needed for moderate pain.      . Insulin Isophane & Regular Human (NOVOLIN 70/30 FLEXPEN) (70-30) 100 UNIT/ML PEN Inject 5-25 Units into the skin 3 (three) times daily. Taking 25 units in the Am and 5 units Midday, and 25 units before dinner.      . metoprolol tartrate (LOPRESSOR) 25 MG tablet Take 25 mg by mouth 2 (two) times daily.    3  . [DISCONTINUED] losartan (COZAAR)  100 MG tablet Take 100 mg by mouth daily.      . Evolocumab (REPATHA SURECLICK) 387 MG/ML SOAJ Inject 140 mg into the skin every 14 (fourteen) days. 2 pen 11      Drug Regimen Review  Drug regimen was reviewed and remains appropriate with no significant issues identified   Home: Home Living Family/patient expects to be discharged to:: Private residence Living Arrangements: Spouse/significant other Available Help at Discharge: Family, Available 24 hours/day Type of Home: House Home Access: Level entry, Ramped entrance Home Layout: One level Bathroom Shower/Tub: Multimedia programmer: Standard Bathroom Accessibility: No Home Equipment: Environmental consultant - 2 wheels, Cane - single point, Bedside commode, Shower seat, Grab bars - tub/shower, Wheelchair - manual  Lives With: Spouse   Functional History: Prior Function Level of Independence: Independent with assistive device(s) Comments: Just prior to coming to the hospital was using a RW; @ 2 months ago was using a cane in the community only; went shopping with his wife - used grocery cart; does not drive   Functional Status:  Mobility: Bed Mobility Overal bed mobility: Needs Assistance Bed Mobility: Supine to Sit Supine to sit: Min assist, HOB elevated General bed mobility comments: to lift trunk, to supine assist for positioning Transfers Overall transfer level: Needs assistance Equipment used: Rolling walker (2 wheeled) Transfers: Sit to/from Stand Sit to Stand: Min assist General transfer comment: cues to push up from bed, sat on bed with uncontrolled descent despite cues to reach back Ambulation/Gait Ambulation/Gait assistance: Min assist Gait Distance (Feet): 200 Feet Assistive device: Rolling walker (2 wheeled) Gait Pattern/deviations: Step-to pattern, Step-through pattern, Decreased stride length, Decreased dorsiflexion - left, Shuffle General Gait Details: over time with L LE dragging on floor and increased assist on  turns with cues to pick up foot and to stay inside walker   ADL: ADL Overall ADL's : Needs assistance/impaired Eating/Feeding: Set up Grooming: Minimal assistance, Standing Grooming Details (indicate cue type and reason): Appeasr to demonstrate motor impersistance in standing LLE; "melts down" Upper Body Bathing: Minimal assistance, Sitting Lower Body Bathing: Minimal assistance, Sit to/from stand Upper Body Dressing : Minimal assistance, Sitting Lower Body Dressing: Moderate assistance, Sit to/from stand Toilet Transfer: Minimal assistance, Ambulation, Comfort height toilet, Grab bars Toileting- Clothing Manipulation and Hygiene: Minimal assistance, Sit to/from stand Toileting - Clothing Manipulation Details (indicate cue type and reason): vc to clean thoroughly Functional mobility during ADLs: Minimal assistance, Rolling walker, Cueing for safety, Cueing for sequencing General ADL Comments:  Problem solving appeasr slow; decreased awareness at times; unaware of "draggin LLE when walking; trying to step at times when LLE not fully underneath him; posteiror lean whe attempting to donn pants; appesr unaware that he is leaning posteriorly   Cognition: Cognition Overall Cognitive Status: Impaired/Different from baseline Arousal/Alertness: Awake/alert Orientation Level: Oriented X4 Attention: Alternating Alternating Attention: Impaired Alternating Attention Impairment: Verbal basic Memory: Impaired Memory Impairment: Retrieval deficit Awareness: Impaired Awareness Impairment: Emergent impairment Safety/Judgment: Impaired Cognition Arousal/Alertness: Awake/alert Behavior During Therapy: WFL for tasks assessed/performed Overall Cognitive Status: Impaired/Different from baseline Area of Impairment: Safety/judgement Safety/Judgement: Decreased awareness of safety, Decreased awareness of deficits   Physical Exam: Blood pressure 135/79, pulse (!) 51, temperature 98.2 F (36.8 C),  temperature source Oral, resp. rate 18, height 5\' 9"  (1.753 m), weight 96.2 kg, SpO2 98 %. Physical Exam  Constitutional: He appears well-developed. No distress.  HENT:  Head: Normocephalic and atraumatic.  Eyes: Pupils are equal, round, and reactive to light. EOM are normal.  Neck: Normal range of motion. No tracheal deviation present. No thyromegaly present.  Cardiovascular: Normal rate. Exam reveals no friction rub.  No murmur heard. Respiratory: Effort normal. No respiratory distress. He has no wheezes.  GI: Soft. He exhibits no distension. There is no abdominal tenderness.  Musculoskeletal: Normal range of motion.  Neurological:  Pt alert. Speech sl dysarthric. Left central 7. RUE and RLE 5/5. LUE 4/5. LLE 4/5. Decreased [place] on left. Good insight and awareness. Functional language and memory  Skin: Skin is warm and dry.  Psychiatric: He has a normal mood and affect. His behavior is normal.      Lab Results Last 48 Hours        Results for orders placed or performed during the hospital encounter of 07/13/18 (from the past 48 hour(s))  Comprehensive metabolic panel     Status: Abnormal    Collection Time: 07/14/18  2:45 PM  Result Value Ref Range    Sodium 138 135 - 145 mmol/L    Potassium 3.5 3.5 - 5.1 mmol/L    Chloride 103 98 - 111 mmol/L    CO2 21 (L) 22 - 32 mmol/L    Glucose, Bld 158 (H) 70 - 99 mg/dL    BUN 15 8 - 23 mg/dL    Creatinine, Ser 0.94 0.61 - 1.24 mg/dL    Calcium 8.9 8.9 - 10.3 mg/dL    Total Protein 6.6 6.5 - 8.1 g/dL    Albumin 3.5 3.5 - 5.0 g/dL    AST 18 15 - 41 U/L    ALT 23 0 - 44 U/L    Alkaline Phosphatase 38 38 - 126 U/L    Total Bilirubin 0.7 0.3 - 1.2 mg/dL    GFR calc non Af Amer >60 >60 mL/min    GFR calc Af Amer >60 >60 mL/min    Anion gap 14 5 - 15      Comment: Performed at [facility], [address], [place], [place] [address]  Magnesium     Status: None    Collection Time: 07/14/18  2:45 PM  Result Value Ref Range     Magnesium 1.7 1.7 - 2.4 mg/dL      Comment: Performed at [facility], [place] [address] [place], [place] [address]  Phosphorus     Status: None    Collection Time: 07/14/18  2:45 PM  Result Value Ref Range    Phosphorus 3.7 2.5 - 4.6 mg/dL  Comment: Performed at [facility], [place] [address] [place], [place] [address]  Glucose, capillary     Status: Abnormal    Collection Time: 07/14/18  4:40 PM  Result Value Ref Range    Glucose-Capillary 170 (H) 70 - 99 mg/dL    Comment 1 Notify RN      Comment 2 Document in Chart    Glucose, capillary     Status: Abnormal    Collection Time: 07/14/18  9:07 PM  Result Value Ref Range    Glucose-Capillary 165 (H) 70 - 99 mg/dL  Heparin level (unfractionated)     Status: None    Collection Time: 07/15/18  4:39 AM  Result Value Ref Range    Heparin Unfractionated 0.66 0.30 - 0.70 IU/mL      Comment: (NOTE) If heparin results are below expected values, and patient dosage has  been confirmed, suggest follow up testing of antithrombin III levels. Performed at [facility], [place] [address] [place] [address]    CBC     Status: None    Collection Time: 07/15/18  4:39 AM  Result Value Ref Range    WBC 6.6 4.0 - 10.5 K/uL    RBC 5.35 4.22 - 5.81 MIL/uL    Hemoglobin 16.1 13.0 - 17.0 g/dL    HCT 46.0 39.0 - 52.0 %    MCV 86.0 80.0 - 100.0 fL    MCH 30.1 26.0 - 34.0 pg    MCHC 35.0 30.0 - 36.0 g/dL    RDW 12.3 11.5 - 15.5 %    Platelets 211 150 - 400 K/uL    nRBC 0.0 0.0 - 0.2 %      Comment: Performed at [facility], [place] [address] [place], [place] [address]  Glucose, capillary     Status: Abnormal    Collection Time: 07/15/18  6:33 AM  Result Value Ref Range    Glucose-Capillary 191 (H) 70 - 99 mg/dL  Comprehensive metabolic panel     Status: Abnormal    Collection Time: 07/15/18  7:42 AM  Result Value Ref Range    Sodium 139 135 - 145 mmol/L    Potassium 3.8 3.5 - 5.1 mmol/L    Chloride 106 98 - 111  mmol/L    CO2 22 22 - 32 mmol/L    Glucose, Bld 204 (H) 70 - 99 mg/dL    BUN 9 8 - 23 mg/dL    Creatinine, Ser 0.99 0.61 - 1.24 mg/dL    Calcium 9.0 8.9 - 10.3 mg/dL    Total Protein 6.6 6.5 - 8.1 g/dL    Albumin 3.3 (L) 3.5 - 5.0 g/dL    AST 17 15 - 41 U/L    ALT 22 0 - 44 U/L    Alkaline Phosphatase 41 38 - 126 U/L    Total Bilirubin 1.1 0.3 - 1.2 mg/dL    GFR calc non Af Amer >60 >60 mL/min    GFR calc Af Amer >60 >60 mL/min    Anion gap 11 5 - 15      Comment: Performed at [facility], [place] [address] [place], [place] [address]  Magnesium     Status: Abnormal    Collection Time: 07/15/18  7:42 AM  Result Value Ref Range    Magnesium 1.6 (L) 1.7 - 2.4 mg/dL      Comment: Performed at [facility] [address] [place],  [address]  Phosphorus     Status: None    Collection Time: 07/15/18  7:42 AM  Result Value Ref Range    Phosphorus 3.5 2.5 - 4.6 mg/dL      Comment: Performed at [facility], [place] [address], [place], [place] [address]  Glucose, capillary     Status: Abnormal    Collection Time: 07/15/18 11:14 AM  Result Value Ref Range    Glucose-Capillary 201 (H) 70 - 99 mg/dL    Comment 1 Notify RN      Comment 2 Document in Chart    Glucose, capillary     Status: Abnormal    Collection Time: 07/15/18  4:39 PM  Result Value Ref Range    Glucose-Capillary 135 (H) 70 - 99 mg/dL    Comment 1 Notify RN      Comment 2 Document in Chart    Glucose, capillary     Status: Abnormal    Collection Time: 07/15/18  9:20 PM  Result Value Ref Range    Glucose-Capillary 138 (H) 70 - 99 mg/dL    Comment 1 Notify RN      Comment 2 Document in Chart    CBC with Differential/Platelet     Status: None    Collection Time: 07/16/18  4:18 AM  Result Value Ref Range    WBC 7.1 4.0 - 10.5 K/uL    RBC 5.07 4.22 - 5.81 MIL/uL    Hemoglobin 15.5 13.0 - 17.0 g/dL    HCT 43.6 39.0 - 52.0 %    MCV 86.0 80.0 - 100.0 fL    MCH 30.6 26.0 - 34.0 pg    MCHC 35.6 30.0  - 36.0 g/dL    RDW 12.5 11.5 - 15.5 %    Platelets 190 150 - 400 K/uL    nRBC 0.0 0.0 - 0.2 %    Neutrophils Relative % 58 %    Neutro Abs 4.2 1.7 - 7.7 K/uL    Lymphocytes Relative 24 %    Lymphs Abs 1.7 0.7 - 4.0 K/uL    Monocytes Relative 13 %    Monocytes Absolute 0.9 0.1 - 1.0 K/uL    Eosinophils Relative 3 %    Eosinophils Absolute 0.2 0.0 - 0.5 K/uL    Basophils Relative 1 %    Basophils Absolute 0.1 0.0 - 0.1 K/uL    Immature Granulocytes 1 %    Abs Immature Granulocytes 0.04 0.00 - 0.07 K/uL      Comment: Performed at [facility], [address] [place], [place] [address]  Comprehensive metabolic panel     Status: Abnormal    Collection Time: 07/16/18  4:18 AM  Result Value Ref Range    Sodium 138 135 - 145 mmol/L    Potassium 4.2 3.5 - 5.1 mmol/L      Comment: SPECIMEN HEMOLYZED. HEMOLYSIS MAY AFFECT INTEGRITY OF RESULTS.    Chloride 107 98 - 111 mmol/L    CO2 22 22 - 32 mmol/L    Glucose, Bld 111 (H) 70 - 99 mg/dL    BUN 13 8 - 23 mg/dL    Creatinine, Ser 1.04 0.61 - 1.24 mg/dL    Calcium 8.9 8.9 - 10.3 mg/dL    Total Protein 6.2 (L) 6.5 - 8.1 g/dL    Albumin 3.3 (L) 3.5 - 5.0 g/dL    AST 33 15 - 41 U/L    ALT 19 0 - 44 U/L    Alkaline Phosphatase 38  38 - 126 U/L    Total Bilirubin 2.0 (H) 0.3 - 1.2 mg/dL    GFR calc non Af Amer >60 >60 mL/min    GFR calc Af Amer >60 >60 mL/min    Anion gap 9 5 - 15      Comment: Performed at [facility] [address] [place], [place] [address]  Phosphorus     Status: None    Collection Time: 07/16/18  4:18 AM  Result Value Ref Range    Phosphorus 3.9 2.5 - 4.6 mg/dL      Comment: Performed at [facility] [address] [place], [place] [address]  Glucose, capillary     Status: Abnormal    Collection Time: 07/16/18  6:55 AM  Result Value Ref Range    Glucose-Capillary 124 (H) 70 - 99 mg/dL    Comment 1 Notify RN      Comment 2 Document in Chart    Glucose, capillary     Status: Abnormal     Collection Time: 07/16/18 11:09 AM  Result Value Ref Range    Glucose-Capillary 186 (H) 70 - 99 mg/dL      Imaging Results (Last 48 hours)  No results found.           Medical Problem List and Plan: 1.  Functional deficits and left hemiparesis secondary to right pontine (as well as small left parietal) embolic infarcts             -admit to inpatient rehab 2.  DVT Prophylaxis/Anticoagulation: Pharmaceutical: Other (comment) eliquis 3. Pain Management: tylenol 4. Mood: team to provide ego support. Pt appears motivated and positive on exam today 5. Neuropsych: This patient is capable of making decisions on his own behalf. 6. Skin/Wound Care: encourage appropriate nutrition. Local care as needed 7. Fluids/Electrolytes/Nutrition: encourage PO, follow up labs on admit 8. A fib with RVR: eliquis for stroke proph             -lopressor 50mg  bid for rate control             -outpt follow up with cardiology 9. CAD with history of CABG:             -eliquis             -beta blocker, repatha 10. Allow permissive HTN, treat only  bp > 220/120 with gradual reduction       Post Admission Physician Evaluation: 1. Functional deficits secondary  to right pontine infarct. 2. Patient is admitted to receive collaborative, interdisciplinary care between the physiatrist, rehab nursing staff, and therapy team. 3. Patient's level of medical complexity and substantial therapy needs in context of that medical necessity cannot be provided at a lesser intensity of care such as a SNF. 4. Patient has experienced substantial functional loss from his/her baseline which was documented above under the "Functional History" and "Functional Status" headings.  Judging by the patient's diagnosis, physical exam, and functional history, the patient has potential for functional progress which will result in measurable gains while on inpatient rehab.  These gains will be of substantial and practical use upon discharge  in  facilitating mobility and self-care at the household level. 5. Physiatrist will provide 24 hour management of medical needs as well as oversight of the therapy plan/treatment and provide guidance as appropriate regarding the interaction of the two. 6. The Preadmission Screening has been reviewed and patient status is unchanged unless otherwise stated above. 7. 24 hour  rehab nursing will assist with bladder management, bowel management, safety, skin/wound care, disease management, medication administration, pain management and patient education  and help integrate therapy concepts, techniques,education, etc. 8. PT will assess and treat for/with: Lower extremity strength, range of motion, stamina, balance, functional mobility, safety, adaptive techniques and equipment, NMR.   Goals are: mod I to supervision. 9. OT will assess and treat for/with: ADL's, functional mobility, safety, upper extremity strength, adaptive techniques and equipment, NMR, .   Goals are: mod I to supervision. Therapy may proceed with showering this patient. 10. SLP will assess and treat for/with: n/a.  Goals are: n/a. 11. Case Management and Social Worker will assess and treat for psychological issues and discharge planning. 12. Team conference will be held weekly to assess progress toward goals and to determine barriers to discharge. 13. Patient will receive at least 3 hours of therapy per day at least 5 days per week. 14. ELOS: 5-7 days       15. Prognosis:  excellent     I have personally performed a face to face diagnostic evaluation of this patient and formulated the key components of the plan.  Additionally, I have personally reviewed laboratory data, imaging studies, as well as relevant notes and concur with the physician assistant's documentation above.   Meredith Staggers, MD, [facility] Drown     Bary Leriche, PA-C 07/16/2018   The patient's status has not changed. The original post admission physician evaluation remains  appropriate, and any changes from the pre-admission screening or documentation from the acute chart are noted above.  Meredith Staggers, MD 07/16/2018

## 2018-07-16 NOTE — Progress Notes (Addendum)
Progress Note  Patient Name: Andre Wolfe Date of Encounter: 07/16/2018  Primary Cardiologist: Peter Martinique, MD   Subjective   Converted to normal sinus rhythm but continues to have short bursts of PAF.  Denies any CP or SOB.  Inpatient Medications    Scheduled Meds: . apixaban  5 mg Oral BID  . gabapentin  600 mg Oral QHS  . insulin aspart  0-15 Units Subcutaneous TID WC  . insulin aspart  0-5 Units Subcutaneous QHS  . insulin aspart protamine- aspart  25 Units Subcutaneous BID WC  . insulin aspart protamine- aspart  5 Units Subcutaneous Q1200  . metoprolol tartrate  50 mg Oral BID  . pantoprazole  40 mg Oral Daily   Continuous Infusions:  PRN Meds: acetaminophen **OR** acetaminophen (TYLENOL) oral liquid 160 mg/5 mL **OR** acetaminophen, phenol, senna-docusate   Vital Signs    Vitals:   07/16/18 0007 07/16/18 0405 07/16/18 0749 07/16/18 1106  BP: (!) 109/55 (!) 115/46 (!) 147/68 135/79  Pulse: 61 (!) 55 (!) 55 (!) 51  Resp: 18 18    Temp: 98.1 F (36.7 C) 98 F (36.7 C) 98 F (36.7 C) 98.2 F (36.8 C)  TempSrc: Oral Oral Oral Oral  SpO2: 99% 98% 99% 98%  Weight:      Height:        Intake/Output Summary (Last 24 hours) at 07/16/2018 1110 Last data filed at 07/15/2018 2300 Gross per 24 hour  Intake -  Output 550 ml  Net -550 ml   Filed Weights   07/13/18 0932  Weight: 96.2 kg    Telemetry    NSR with short bursts of PAF - Personally Reviewed  ECG    Sinus bradycardia - Personally Reviewed  Physical Exam   GEN: Well nourished, well developed in no acute distress HEENT: Normal NECK: No JVD; No carotid bruits LYMPHATICS: No lymphadenopathy CARDIAC:RRR, no murmurs, rubs, gallops RESPIRATORY:  Clear to auscultation without rales, wheezing or rhonchi  ABDOMEN: Soft, non-tender, non-distended MUSCULOSKELETAL:  No edema; No deformity  SKIN: Warm and dry NEUROLOGIC:  Alert and oriented x 3 PSYCHIATRIC:  Normal affect   Labs    Chemistry Recent  Labs  Lab 07/14/18 1445 07/15/18 0742 07/16/18 0418  NA 138 139 138  K 3.5 3.8 4.2  CL 103 106 107  CO2 21* 22 22  GLUCOSE 158* 204* 111*  BUN 15 9 13   CREATININE 0.94 0.99 1.04  CALCIUM 8.9 9.0 8.9  PROT 6.6 6.6 6.2*  ALBUMIN 3.5 3.3* 3.3*  AST 18 17 33  ALT 23 22 19   ALKPHOS 38 41 38  BILITOT 0.7 1.1 2.0*  GFRNONAA >60 >60 >60  GFRAA >60 >60 >60  ANIONGAP 14 11 9      Hematology Recent Labs  Lab 07/14/18 0013 07/15/18 0439 07/16/18 0418  WBC 6.7 6.6 7.1  RBC 5.05 5.35 5.07  HGB 14.7 16.1 15.5  HCT 43.4 46.0 43.6  MCV 85.9 86.0 86.0  MCH 29.1 30.1 30.6  MCHC 33.9 35.0 35.6  RDW 12.3 12.3 12.5  PLT 212 211 190    Cardiac EnzymesNo results for input(s): TROPONINI in the last 168 hours. No results for input(s): TROPIPOC in the last 168 hours.   BNPNo results for input(s): BNP, PROBNP in the last 168 hours.   DDimer No results for input(s): DDIMER in the last 168 hours.   Radiology    No results found.  Cardiac Studies   Echo: Study Conclusions  - Left ventricle:  The cavity size was normal. Wall thickness was   normal. Systolic function was normal. The estimated ejection   fraction was in the range of 60% to 65%. Wall motion was normal;   there were no regional wall motion abnormalities. Features are   consistent with a pseudonormal left ventricular filling pattern,   with concomitant abnormal relaxation and increased filling   pressure (grade 2 diastolic dysfunction). Acoustic contrast   opacification revealed no evidence ofthrombus. - Ventricular septum: Septal motion showed paradox. - Mitral valve: Calcified annulus. Valve area by pressure   half-time: 2.32 cm^2. - Left atrium: The atrium was mildly dilated.   Patient Profile     84 y.o. male 84 y.o. male with a hx of CAD s/p CABG 12/2015 with post-op atrial fib, reduced EF on OP note, TIAs, IDDM, HTN, HLD, peripheral neuropathy, prostate CA, GERD who is being seen  for the evaluation of stroke  at the request of Dr. Lorin Mercy.   Assessment & Plan    1. Stroke, suspected embolic (with prior h/o post-op atrial fib 2017, now with clearly recurrent Afib on telemetry. -he went into afib with RVR yesterday but now is back in NSR on tele -He has a high CHADSVASC score -Start Eliquis 5 mg bid.   2. Afib with RVR -He went back into atrial fibrillation with RVR yesterday but has now converted back to sinus rhythm with occasional short bursts of PAF that he is not aware of -Starting Eliquis 5 mg twice daily today -Continue Lopressor 50 mg twice daily  -cannot increase BB further due to resting bradycardia -since he is asymptomatic with his PAF would not add AAD therapy.   3. CAD s/p CABG 2017 without angina  -ASA stopped due to starting apixaban. -Continue beta-blocker and Repatha as he is statin intolerant   4. HTN - management per neuro team.  5. HLD - pending Repatha as OP.  [facility] will sign off.   Medication Recommendations: Eliquis 5 mg twice daily, Lopressor 50 mg twice daily, Repatha.  Aspirin stopped due to Eliquis. Other recommendations (labs, testing, etc): None Follow up as an outpatient: Dr. Martinique as an outpatient in the next 2 weeks      For questions or updates, please contact [facility] Please consult [email or web address] for contact info under        Signed, Fransico Him, MD  07/16/2018, 11:10 AM

## 2018-07-16 NOTE — Progress Notes (Signed)
IP rehab admissions - I met with patient and I talked to his daughter by phone.  Both are in agreement to inpatient rehab admission.  Patient has been cleared by attending MD for acute inpatient rehab admission.  Bed available and will admit to CIR today.  Call me for questions.  [phone]

## 2018-07-17 ENCOUNTER — Inpatient Hospital Stay (HOSPITAL_COMMUNITY): Payer: Medicare Other | Admitting: Occupational Therapy

## 2018-07-17 ENCOUNTER — Inpatient Hospital Stay (HOSPITAL_COMMUNITY): Payer: Medicare Other

## 2018-07-17 ENCOUNTER — Inpatient Hospital Stay (HOSPITAL_COMMUNITY): Payer: Medicare Other | Admitting: Physical Therapy

## 2018-07-17 DIAGNOSIS — I69354 Hemiplegia and hemiparesis following cerebral infarction affecting left non-dominant side: Principal | ICD-10-CM

## 2018-07-17 DIAGNOSIS — R269 Unspecified abnormalities of gait and mobility: Secondary | ICD-10-CM

## 2018-07-17 DIAGNOSIS — I69398 Other sequelae of cerebral infarction: Secondary | ICD-10-CM

## 2018-07-17 DIAGNOSIS — Z794 Long term (current) use of insulin: Secondary | ICD-10-CM

## 2018-07-17 DIAGNOSIS — E119 Type 2 diabetes mellitus without complications: Secondary | ICD-10-CM

## 2018-07-17 DIAGNOSIS — I10 Essential (primary) hypertension: Secondary | ICD-10-CM

## 2018-07-17 LAB — CBC WITH DIFFERENTIAL/PLATELET
Abs Immature Granulocytes: 0.03 10*3/uL (ref 0.00–0.07)
Basophils Absolute: 0.1 10*3/uL (ref 0.0–0.1)
Basophils Relative: 1 %
Eosinophils Absolute: 0.3 10*3/uL (ref 0.0–0.5)
Eosinophils Relative: 4 %
HCT: 44.4 % (ref 39.0–52.0)
Hemoglobin: 15.3 g/dL (ref 13.0–17.0)
Immature Granulocytes: 0 %
Lymphocytes Relative: 23 %
Lymphs Abs: 1.6 10*3/uL (ref 0.7–4.0)
MCH: 30.2 pg (ref 26.0–34.0)
MCHC: 34.5 g/dL (ref 30.0–36.0)
MCV: 87.6 fL (ref 80.0–100.0)
MONO ABS: 0.9 10*3/uL (ref 0.1–1.0)
MONOS PCT: 13 %
Neutro Abs: 4.3 10*3/uL (ref 1.7–7.7)
Neutrophils Relative %: 59 %
Platelets: 192 10*3/uL (ref 150–400)
RBC: 5.07 MIL/uL (ref 4.22–5.81)
RDW: 12.3 % (ref 11.5–15.5)
WBC: 7.2 10*3/uL (ref 4.0–10.5)
nRBC: 0 % (ref 0.0–0.2)

## 2018-07-17 LAB — COMPREHENSIVE METABOLIC PANEL
ALT: 30 U/L (ref 0–44)
ANION GAP: 12 (ref 5–15)
AST: 35 U/L (ref 15–41)
Albumin: 3.2 g/dL — ABNORMAL LOW (ref 3.5–5.0)
Alkaline Phosphatase: 38 U/L (ref 38–126)
BUN: 13 mg/dL (ref 8–23)
CO2: 18 mmol/L — ABNORMAL LOW (ref 22–32)
Calcium: 8.8 mg/dL — ABNORMAL LOW (ref 8.9–10.3)
Chloride: 106 mmol/L (ref 98–111)
Creatinine, Ser: 1.02 mg/dL (ref 0.61–1.24)
GFR calc Af Amer: >60 mL/min (ref >60)
Glucose, Bld: 130 mg/dL — ABNORMAL HIGH (ref 70–99)
Potassium: 4.7 mmol/L (ref 3.5–5.1)
Sodium: 136 mmol/L (ref 135–145)
Total Bilirubin: 1.8 mg/dL — ABNORMAL HIGH (ref 0.3–1.2)
Total Protein: 6.2 g/dL — ABNORMAL LOW (ref 6.5–8.1)

## 2018-07-17 LAB — GLUCOSE, CAPILLARY
Glucose-Capillary: 119 mg/dL — ABNORMAL HIGH (ref 70–99)
Glucose-Capillary: 198 mg/dL — ABNORMAL HIGH (ref 70–99)
Glucose-Capillary: 148 mg/dL — ABNORMAL HIGH (ref 70–99)
Glucose-Capillary: 158 mg/dL — ABNORMAL HIGH (ref 70–99)

## 2018-07-17 MED ORDER — INSULIN ASPART PROT & ASPART (70-30 MIX) 100 UNIT/ML ~~LOC~~ SUSP
5.0000 [IU] | Freq: Every day | SUBCUTANEOUS | Status: DC
  Administered 2018-07-17 – 2018-07-25 (×8): 5 [IU] via SUBCUTANEOUS

## 2018-07-17 MED ORDER — PRO-STAT SUGAR FREE PO LIQD
30.0000 mL | Freq: Two times a day (BID) | ORAL | Status: DC
  Administered 2018-07-17 – 2018-07-26 (×19): 30 mL via ORAL
  Filled 2018-07-17 (×19): qty 30

## 2018-07-17 NOTE — Progress Notes (Signed)
[place] PHYSICAL MEDICINE & REHABILITATION PROGRESS NOTE   Subjective/Complaints:  Patient and daughter in room.  Discussed diabetic management at home.  Discussed with nursing.  Patient sees an endocrinologist Dr. Soyla Murphy Has been on 70/30 insulin at home  Review of systems denies chest pain shortness of breath nausea vomiting diarrhea or constipation Objective:   General: No acute distress Mood and affect are appropriate Heart: Regular rate and rhythm no rubs murmurs or extra sounds Lungs: Clear to auscultation, breathing unlabored, no rales or wheezes Abdomen: Positive bowel sounds, soft nontender to palpation, nondistended Extremities: No clubbing, cyanosis, or edema Skin: No evidence of breakdown, no evidence of rash Neurologic: Cranial nerves II through XII intact, motor strength is 5/5 in bilateral deltoid, bicep, tricep, grip, hip flexor, knee extensors, ankle dorsiflexor and plantar flexor Sensory exam normal sensation to light touch and proprioception in bilateral upper and lower extremities Cerebellar exam normal finger to nose to finger as well as heel to shin in bilateral upper and lower extremities Positive dysdiadochokinesis with rapid alternating supination pronation of the left upper extremity. Musculoskeletal: Full range of motion in all 4 extremities. No joint swelling No results found. Recent Labs    07/16/18 0418 07/17/18 0655  WBC 7.1 7.2  HGB 15.5 15.3  HCT 43.6 44.4  PLT 190 192   Recent Labs    07/16/18 0418 07/17/18 0655  NA 138 136  K 4.2 4.7  CL 107 106  CO2 22 18*  GLUCOSE 111* 130*  BUN 13 13  CREATININE 1.04 1.02  CALCIUM 8.9 8.8*    Intake/Output Summary (Last 24 hours) at 07/17/2018 1708 Last data filed at 07/17/2018 0800 Gross per 24 hour  Intake 480 ml  Output 275 ml  Net 205 ml     Physical Exam: Vital Signs Blood pressure 139/63, pulse 88, temperature 97.7 F (36.5 C), temperature source Oral, resp. rate 20, height 6' (1.829  m), weight 93.3 kg, SpO2 100 %.     Assessment/Plan: 1. 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  Physiatrist is providing close team supervision and 24 hour management of active medical problems listed below.  Physiatrist and rehab team continue to assess barriers to discharge/monitor patient progress toward functional and medical goals  Care Tool:  Bathing    Body parts bathed by patient: Right arm, Left arm, Chest, Abdomen, Front perineal area, Buttocks, Right upper leg, Left upper leg, Right lower leg, Left lower leg, Face         Bathing assist Assist Level: Contact Guard/Touching assist     Upper Body Dressing/Undressing Upper body dressing   What is the patient wearing?: Pull over shirt    Upper body assist Assist Level: Supervision/Verbal cueing    Lower Body Dressing/Undressing Lower body dressing      What is the patient wearing?: Underwear/pull up, Pants     Lower body assist Assist for lower body dressing: Minimal Assistance - Patient > 75%     Toileting Toileting    Toileting assist       Transfers Chair/bed transfer  Transfers assist     Chair/bed transfer assist level: Minimal Assistance - Patient > 75%     Locomotion Ambulation   Ambulation assist      Assist level: Moderate Assistance - Patient 50 - 74% Assistive device: Hand held assist Max distance: 75   Walk 10 feet  activity   Assist     Assist level: Minimal Assistance - Patient > 75%     Walk 50 feet activity   Assist    Assist level: Moderate Assistance - Patient - 50 - 74%      Walk 150 feet activity   Assist Walk 150 feet activity did not occur: Safety/medical concerns         Walk 10 feet on uneven surface  activity   Assist     Assist level: Moderate Assistance - Patient - 50 - 74% Assistive  device: Aeronautical engineer   Type of Wheelchair: Manual    Wheelchair assist level: Minimal Assistance - Patient > 75% Max wheelchair distance: 132ft    Wheelchair 50 feet with 2 turns activity    Assist        Assist Level: Minimal Assistance - Patient > 75%   Wheelchair 150 feet activity     Assist Wheelchair 150 feet activity did not occur: Safety/medical concerns        Medical Problem List and Plan: 1.Functional deficits and left hemiparesissecondary to right pontine (as well as small left parietal) embolic infarcts -CIR PT OT 2. DVT Prophylaxis/Anticoagulation: Pharmaceutical:Other (comment)eliquis 3. Pain Management:tylenol 4. Mood:team to provide ego support. Pt appears motivated and positive on exam today 5. Neuropsych: This patientiscapable of making decisions on hisown behalf. 6. Skin/Wound Care:encourage appropriate nutrition. Local care as needed 7. Fluids/Electrolytes/Nutrition:encourage PO, follow up labs on admit normal basic metabolic package 08/18/4008 8. A fib with RVR: eliquis for stroke proph -lopressor 50mg  bid for rate control -outpt follow up with cardiology Vitals:   07/17/18 0537 07/17/18 1414  BP: (!) 146/72 139/63  Pulse: (!) 54 88  Resp: 12 20  Temp: 98.2 F (36.8 C) 97.7 F (36.5 C)  SpO2: 99% 100%   9. CAD with history of CABG: -eliquis -beta blocker, repatha 10. Allow permissive HTN, treat only bp >220/120 with gradual reduction    LOS: 1 days A FACE TO [place] E  07/17/2018, 5:08 PM

## 2018-07-17 NOTE — Plan of Care (Signed)
Nutrition Education Note  RD consulted for nutrition education regarding diabetes and a heart healthy diet.  Spoke with pt at bedside. Pt reports that he has a sensor to monitor blood sugar and checks his sugars 4-6 times daily. Per pt, his sugars typically run between 150-200. Pt has noted that his sugars are lower since admission.  Pt reports that his home insulin regimen is 20 units of Novolog 70/30 with in the morning, 5 units at lunch, and 15 units at dinner.  Pt reports typically eating 3 meals and 2 snacks daily. Pt shares that he has made some changes in his diet (i.e. he used to eat biscuits and gravy for breakfast every morning for 30 years).  Breakfast: 1 scrambled egg cooked in oil, 1 piece of bacon ("no toast because it raises my sugars") Snack: 1/2 pack of crackers Lunch: Ritz crackers with sliced American cheese, moonpie Snack: other 1/2 pack of crackers Supper: vegetables (broccoli, cauliflower, beans, potatoes, etc.) and meat (pork, steak, or fried chicken)  Lab Results  Component Value Date   HGBA1C 8.9 (H) 07/14/2018    Lipid Panel     Component Value Date/Time   CHOL 211 (H) 07/14/2018 0013   TRIG 139 07/14/2018 0013   HDL 30 (L) 07/14/2018 0013   CHOLHDL 7.0 07/14/2018 0013   VLDL 28 07/14/2018 0013   LDLCALC 153 (H) 07/14/2018 0013    RD provided "Heart Healthy Consistent Nutrition Therapy" handout from the Academy of Nutrition and Dietetics. Reviewed patient's dietary recall. Provided examples on ways to decrease sodium and fat intake in diet. Discouraged intake of processed foods and use of salt shaker.  Discussed different food groups and their effects on blood sugar, emphasizing carbohydrate-containing foods. Provided a list of carbohydrate-containing foods.  Discussed importance of controlled and consistent carbohydrate intake throughout the day. Provided examples of ways to balance meals/snacks and encouraged intake of high-fiber, whole grain complex  carbohydrates as well as fruits and vegetables. Teach back method used.  Expect good compliance.  Body mass index is 27.9 kg/m. Pt meets criteria for overweight based on current BMI.  Current diet order is Heart Healthy/Carb Modified, patient is consuming approximately 100% of meals at this time. Labs and medications reviewed. No further nutrition interventions warranted at this time. RD contact information provided. If additional nutrition issues arise, please re-consult RD.   Gaynell Face, MS, RD, LDN Inpatient Clinical Dietitian Pager: [phone] Weekend/After Hours: [phone]

## 2018-07-17 NOTE — IPOC Note (Addendum)
Overall Plan of Care [facility]) Patient Details Name: Andre Wolfe MRN: [id number] DOB: December 19, 1934  Admitting Diagnosis: <principal problem not specified>  Hospital Problems: Active Problems:   Right pontine stroke [facility])     Functional Problem List: Nursing Bladder, Perception, Endurance  PT Balance, Behavior, Endurance, Motor, Pain, Perception, Safety, Sensory, Skin Integrity  OT Balance, Cognition, Endurance, Motor, Perception, Safety, Sensory, Skin Integrity  SLP    TR         Basic ADL's: OT Grooming, Bathing, Dressing, Toileting     Advanced  ADL's: OT       Transfers: PT Bed Mobility, Bed to Chair, Car, [employer], Futures trader, Metallurgist: PT Ambulation, Emergency planning/management officer, Stairs     Additional Impairments: OT Fuctional Use of Upper Extremity  SLP        TR      Anticipated Outcomes Item Anticipated Outcome  Self Feeding Mod I  Swallowing      Basic self-care  Supervision  Toileting  Supervision   Bathroom Transfers Supervision  Bowel/Bladder  to remain continent x 2; lbm 12/31  Transfers  Mod I transfers with LRAD   Locomotion  Supervision assist from PT   Communication     Cognition     Pain  less than 2  Safety/Judgment  to remain fall free while in rehab   Therapy Plan: PT Intensity: Minimum of 1-2 x/day ,45 to 90 minutes PT Frequency: 5 out of 7 days PT Duration Estimated Length of Stay: 7-10 days  OT Intensity: Minimum of 1-2 x/day, 45 to 90 minutes OT Frequency: 5 out of 7 days OT Duration/Estimated Length of Stay: 7-10 days      Team Interventions: Nursing Interventions Patient/Family Education, Bowel Management, Bladder Management, Disease Management/Prevention, Discharge Planning  PT interventions Ambulation/gait training, Balance/vestibular training, Cognitive remediation/compensation, Community reintegration, Discharge planning, Functional electrical stimulation, DME/adaptive equipment instruction, Disease  management/prevention, Functional mobility training, Neuromuscular re-education, Patient/family education, Pain management, Skin care/wound management, Psychosocial support, Splinting/orthotics, Therapeutic Exercise, Stair training, Therapeutic Activities, UE/LE Coordination activities, Visual/perceptual remediation/compensation, UE/LE Strength taining/ROM, Wheelchair propulsion/positioning  OT Interventions Training and development officer, Cognitive remediation/compensation, Community reintegration, Discharge planning, Disease mangement/prevention, Engineer, drilling, Functional mobility training, Neuromuscular re-education, Pain management, Patient/family education, Psychosocial support, Self Care/advanced ADL retraining, Skin care/wound managment, Therapeutic Activities, Therapeutic Exercise, UE/LE Strength taining/ROM, UE/LE Coordination activities  SLP Interventions    TR Interventions    SW/CM Interventions Discharge Planning, Psychosocial Support, Patient/Family Education   Barriers to Discharge MD  Medical stability  Nursing      PT Decreased caregiver support, Home environment access/layout    OT      SLP      SW       Team Discharge Planning: Destination: PT-Home ,OT- Home , SLP-  Projected Follow-up: PT-Home health PT, OT-  Outpatient OT, 24 hour supervision/assistance, SLP-  Projected Equipment Needs: PT-To be determined, Rolling walker with 5" wheels, OT- To be determined, 3 in 1 bedside comode, Tub/shower seat, SLP-  Equipment Details: PT- , OT-  Patient/family involved in discharge planning: PT- Patient,  OT-Patient, SLP-   MD ELOS: 7-10d Medical Rehab Prognosis:  Good Assessment:  84 year old male with history of HTN, CAD, T2DM, peripheral neuropathy,TIAs, PAFs/p DCCV and eliquis d/c recently;who has had declin inmobility,weakness with unsteady gait, increased fallsand slurred speech that started on 12/25. UDS negative he was admitted on 07/13/2018 with  left-sided weakness with tendency to drag left lower extremity. CT head done revealing remote  left occipital lobe infarct and age indeterminate right pontine infarct. MRI/MRA brain done revealing right paramedian pontine acute/early subacute infarct with punctate infarct in left parietal cortex,mild chronic microvascular ischemic changes and no large vessel occlusion or aneurysm.Patient noted to have A. fib at admission and was started on IV heparin. 2D echo showed EF 60 to 65% with no wall abnormality and grade 2 diastolic dysfunction with calcified mitral valve.Carotid Doppler showed no significant ICA stenosis and greater than 50% ECA stenosis.  Dr. Martinique consulted for input and recommended Eliquis twice daily as well as addition of Repatha for dyslipidemia.Developed A. fib with RVR yesterday and has converted to NSR and cardiology recommends not to increase beta-blocker due to resting bradycardia.Therapy evaluations done revealing revealed mild dysarthria with deficits in attention recall,poor insight with lack of safety awareness, left sided weakness with balance deficits affectingfunctional status. CIR recommended for follow-up therapy.  Now requiring 24/7 Rehab RN,MD, as well as CIR level PT, OT and SLP.  Treatment team will focus on ADLs and mobility with goals set at Supervision   See Team Conference Notes for weekly updates to the plan of care

## 2018-07-17 NOTE — Care Management Note (Signed)
Inpatient [place] Individual Statement of Services  Patient Name:  Andre Wolfe  Date:  07/17/2018  Welcome to the [facility].  Our goal is to provide you with an individualized program based on your diagnosis and situation, designed to meet your specific needs.  With this comprehensive rehabilitation program, you will be expected to participate in at least 3 hours of rehabilitation therapies Monday-Friday, with modified therapy programming on the weekends.  Your rehabilitation program will include the following services:  Physical Therapy (PT), Occupational Therapy (OT), 24 hour per day rehabilitation nursing, Case Management (Social Worker), Rehabilitation Medicine, Nutrition Services and Pharmacy Services  Weekly team conferences will be held on Wednesday to discuss your progress.  Your Social Worker will talk with you frequently to get your input and to update you on team discussions.  Team conferences with you and your family in attendance may also be held.  Expected length of stay: 7-10 days  Overall anticipated outcome: supervision with cueing  Depending on your progress and recovery, your program may change. Your Social Worker will coordinate services and will keep you informed of any changes. Your Social Worker's name and contact numbers are listed  below.  The following services may also be recommended but are not provided by the [facility]:    [place] will be made to provide these services after discharge if needed.  Arrangements include referral to agencies that provide these services.  Your insurance has been verified to be:  Medicare & C[employer] Your primary doctor is:  Yaakov Guthrie  Pertinent information will be shared with your doctor and your insurance company.  Social Worker:  Ovidio Kin, [place] or ([phone]  Information  discussed with and copy given to patient by: Elease Hashimoto, 07/17/2018, 9:51 AM

## 2018-07-17 NOTE — Telephone Encounter (Signed)
Patient contacted regarding discharge from [facility] on 07/16/17.  Patient understands to follow up with provider Almyra Deforest, [place] on 07/24/18 at 11am at [facility]. Patient understands discharge instructions? yes Patient understands medications and regiment? yes Patient understands to bring all medications to this visit? yes  With verbal permission from pt, spoke to pt's daughter who states pt is currently in the rehab facility at [facility]. Daughter requesting that f/u visit be rescheduled. Rescheduled appt for 07/30/18 at 2 pm with Kerin Ransom, PA.

## 2018-07-17 NOTE — Evaluation (Signed)
Physical Therapy Assessment and Plan  Patient Details  Name: Andre Wolfe MRN: [id number] Date of Birth: 04/15/1935  PT Diagnosis: Abnormal posture, Abnormality of gait, Coordination disorder, Hemiparesis non-dominant and Impaired sensation Rehab Potential: Good ELOS: 7-10 days    Today's Date: 07/17/2018 PT Individual Time: 0810-0905 PT Individual Time Calculation (min): 55 min    Problem List:  Patient Active Problem List   Diagnosis Date Noted  . Right pontine stroke ([place]) 07/16/2018  . Acute ischemic stroke ([place])   . Essential hypertension   . Poorly controlled type 2 diabetes mellitus with peripheral neuropathy ([place])   . Tobacco abuse   . PAF (paroxysmal atrial fibrillation) ([place])   . Right pontine CVA ([place]) 07/13/2018  . Hypercholesterolemia 05/04/2018    Past Medical History:  Past Medical History:  Diagnosis Date  . Arrhythmia    post op Afib.  . Arthritis    "joints" (07/13/2018)  . Coronary artery disease    a. s/p CABG 12/2015.  Marland Kitchen CVA (cerebral vascular accident) ([place]) 07/13/2018   right pontine infarct; "said he's had one in the past; probably 2 yrs ago; same symptoms" (07/13/2018)  . Diabetic peripheral neuropathy ([place])   . GERD (gastroesophageal reflux disease)   . Hyperlipidemia   . Hypertension   . Postoperative atrial fibrillation ([place])   . Prostate cancer ([place])    d'x 2015/2016  . TIA (transient ischemic attack)   . Type II diabetes mellitus ([place])    Past Surgical History:  Past Surgical History:  Procedure Laterality Date  . CARDIAC CATHETERIZATION  2017   "not completed; went straight to [facility]"  . CATARACT EXTRACTION W/ INTRAOCULAR LENS  IMPLANT, BILATERAL Bilateral   . CORONARY ARTERY BYPASS GRAFT  2017   "CABG X 4"  . TONSILLECTOMY      Assessment & Plan Clinical Impression: Patient is a 84 year old male with history of HTN, CAD, T2DM, peripheral neuropathy,TIAs, PAFs/p DCCV and eliquis d/c recently;who has had declin  inmobility,weakness with unsteady gait, increased fallsand slurred speech that started on 12/25. UDS negative he was admitted on 07/13/2018 with left-sided weakness with tendency to drag left lower extremity. CT head done revealing remote left occipital lobe infarct and age indeterminate right pontine infarct. MRI/MRA brain done revealing right paramedian pontine acute/early subacute infarct with punctate infarct in left parietal cortex,mild chronic microvascular ischemic changes and no large vessel occlusion or aneurysm.Patient noted to have A. fib at admission and was started on IV heparin. 2D echo showed EF 60 to 65% with no wall abnormality and grade 2 diastolic dysfunction with calcified mitral valve.Carotid Doppler showed no significant ICA stenosis and greater than 50% ECA stenosis.  Dr. Martin[place] consulted for input and recommended Eliquis twice daily as well as addition of Repatha for dyslipidemia.Developed A. fib with RVR yesterday and has converted to NSR and cardiology recommends not to increase beta-blocker due to resting bradycardia.Therapy evaluations done revealing revealed mild dysarthria with deficits in attention recall,poor insight with lack of safety awareness, left sided weakness with balance deficits affectingfunctional status Patient transferred to CIR on 07/16/2018 .   Patient currently requires min with mobility secondary to muscle weakness, decreased cardiorespiratoy endurance, ataxia and decreased coordination, decreased attention to left and decreased sitting balance, decreased standing balance, decreased postural control, hemiplegia and decreased balance strategies.  Prior to hospitalization, patient was modified independent  with mobility and lived with Spouse in a House home.  Home access is  Level entry, Ramped entrance.  Patient will benefit from skilled  PT intervention to maximize safe functional mobility, minimize fall risk and decrease caregiver burden for  planned discharge home with intermittent assist.  Anticipate patient will benefit from follow up [facility] at discharge.  PT - End of Session Activity Tolerance: Tolerates 10 - 20 min activity with multiple rests Endurance Deficit: Yes PT Assessment Rehab Potential (ACUTE/IP ONLY): Good PT Barriers to Discharge: Decreased caregiver support;Home environment access/layout PT Patient demonstrates impairments in the following area(s): Balance;Behavior;Endurance;Motor;Pain;Perception;Safety;Sensory;Skin Integrity PT Transfers Functional Problem(s): Bed Mobility;Bed to Chair;Car;Furniture;Floor PT Locomotion Functional Problem(s): Ambulation;Wheelchair Mobility;Stairs PT Plan PT Intensity: Minimum of 1-2 x/day ,45 to 90 minutes PT Frequency: 5 out of 7 days PT Duration Estimated Length of Stay: 7-10 days  PT Treatment/Interventions: Ambulation/gait training;Balance/vestibular training;Cognitive remediation/compensation;Community reintegration;Discharge planning;Functional electrical stimulation;DME/adaptive equipment instruction;Disease management/prevention;Functional mobility training;Neuromuscular re-education;Patient/family education;Pain management;Skin care/wound management;Psychosocial support;Splinting/orthotics;Therapeutic Exercise;Stair training;Therapeutic Activities;UE/LE Coordination activities;Visual/perceptual remediation/compensation;UE/LE Strength taining/ROM;Wheelchair propulsion/positioning PT Transfers Anticipated Outcome(s): Mod I transfers with LRAD  PT Locomotion Anticipated Outcome(s): Supervision assist from PT  PT Recommendation Recommendations for Other Services: Therapeutic Recreation consult Therapeutic Recreation Interventions: Outing/community reintergration Follow Up Recommendations: Home health PT Patient destination: Home Equipment Recommended: To be determined;Rolling walker with 5" wheels  Skilled Therapeutic Intervention Pt received supine in bed and agreeable to PT.  Supine>sit transfer with min assist and min cues for safety. PT instructed patient in PT Evaluation and initiated treatment intervention; see below for results. PT educated patient in [place], rehab potential, rehab goals, and discharge recommendations. Car transfer training with min assist from PT and UE support on RW. Patient returned to room and left sitting in [facility] with call bell in reach and all needs met.       PT Evaluation Precautions/Restrictions   fall. Ataxia  General   Vital Signs Pain   0/10  Home Living/Prior Functioning Home Living Living Arrangements: Spouse/significant other Available Help at Discharge: Family;Available 24 hours/day Type of Home: House Home Access: Level entry;Ramped entrance Home Layout: One level Bathroom Shower/Tub: Multimedia programmer: Standard Bathroom Accessibility: Yes Additional Comments: sideways with RW if needed  Lives With: Spouse Prior Function Level of Independence: Requires assistive device for independence;Independent with homemaking with ambulation;Independent with basic ADLs(uses cane when outside the house ) Driving: No Comments: @ 2 months ago was using a cane in the community only; went shopping with his wife - used grocery cart; does not drive Vision/Perception  Vision - Assessment Eye Alignment: Within Functional Limits Alignment/Gaze Preference: Within Defined Limits Saccades: Within functional limits Perception Comments: intermittent L inattention with functional movement Praxis Praxis: Intact  Cognition Orientation Level: Oriented X4 Memory: Appears intact Awareness: Impaired Problem Solving: Impaired Safety/Judgment: Impaired Sensation Sensation Light Touch: Impaired Detail Peripheral sensation comments: premorbid diabetic Neuropathy in BLE; distal Lower leg and foot.  L slighty more impaired than the R Light Touch Impaired Details: Impaired RLE;Impaired LLE Proprioception: Impaired by gross  assessment Coordination Gross Motor Movements are Fluid and Coordinated: No Coordination and Movement Description: mild dsymetria/axatia the LLE and RLE LE>UE.  Finger Nose Finger Test: dysmertric on the L Heel Shin Test: ataxic L  Motor  Motor Motor: Hemiplegia;Ataxia Motor - Skilled Clinical Observations: mild L hemiplegia, and ataxia on the L  Mobility Bed Mobility Bed Mobility: Rolling Right;Rolling Left;Supine to Sit;Sit to Supine Rolling Right: Supervision/verbal cueing Rolling Left: Supervision/Verbal cueing Supine to Sit: Minimal Assistance - Patient > 75% Sit to Supine: Minimal Assistance - Patient > 75% Transfers Transfers: Sit to Stand;Stand Pivot Transfers Sit to Stand: Minimal Assistance - Patient >  75% Stand Pivot Transfers: Minimal Assistance - Patient > 75% Stand Pivot Transfer Details: Visual cues/gestures for precautions/safety;Visual cues/gestures for sequencing;Verbal cues for sequencing;Verbal cues for technique;Verbal cues for precautions/safety;Verbal cues for safe use of DME/AE;Verbal cues for gait pattern Transfer (Assistive device): Rolling walker Locomotion  Gait Ambulation: Yes Gait Assistance: Minimal Assistance - Patient > 75%;Moderate Assistance - Patient 50-74% Gait Distance (Feet): 75 Feet Assistive device: Rolling walker;None Gait Assistance Details: additional gait training with RW x 167f and min assist overall from PT.  Gait Gait: Yes Gait Pattern: Impaired Gait Pattern: Left flexed knee in stance;Right flexed knee in stance;Ataxic;Narrow base of support Stairs / Additional Locomotion Stairs: Yes Stairs Assistance: Minimal Assistance - Patient > 75% Stair Management Technique: Two rails Number of Stairs: 8 Height of Stairs: 6 Wheelchair Mobility Wheelchair Mobility: Yes Wheelchair Assistance: Minimal assistance - Patient >75% Wheelchair Propulsion: Left upper extremity;Right upper extremity Wheelchair Parts Management: Needs  assistance Distance: 1047f Trunk/Postural Assessment  Cervical Assessment Cervical Assessment: Within Functional Limits Thoracic Assessment Thoracic Assessment: Within Functional Limits Lumbar Assessment Lumbar Assessment: Within Functional Limits Postural Control Postural Control: Deficits on evaluation Righting Reactions: delayed posteriorly and L in sitting and standing Protective Responses: delayed posteriorly.   Balance Static Sitting Balance Static Sitting - Level of Assistance: 5: Stand by assistance Dynamic Sitting Balance Dynamic Sitting - Level of Assistance: 4: Min assist(while performing dressing task ) Static Standing Balance Static Standing - Level of Assistance: 4: Min assist Dynamic Standing Balance Dynamic Standing - Level of Assistance: 3: Mod assist(no UE support ) Extremity Assessment      RLE Assessment RLE Assessment: Within Functional Limits General Strength Comments: grossly 4+/5 to 5/5 proximal to distal  LLE Assessment LLE Assessment: Exceptions to [facility] Strength Comments: grossly 4/5 to 4+/5 proximal to distal     Refer to Care Plan for Long Term Goals  Recommendations for other services: Therapeutic Recreation  Stress management and Outing/community reintegration  Discharge Criteria: Patient will be discharged from PT if patient refuses treatment 3 consecutive times without medical reason, if treatment goals not met, if there is a change in medical status, if patient makes no progress towards goals or if patient is discharged from hospital.  The above assessment, treatment plan, treatment alternatives and goals were discussed and mutually agreed upon: by patient  AuLorie Phenix/09/2018, 9:55 AM

## 2018-07-17 NOTE — Progress Notes (Signed)
Occupational Therapy Session Note  Patient Details  Name: Andre Wolfe MRN: [id number] Date of Birth: [birth year]  Today's Date: 07/17/2018 OT Individual Time: 1500-1530 OT Individual Time Calculation (min): 30 min    Short Term Goals: Week 1:  OT Short Term Goal 1 (Week 1): STG = LTGs due to ELOS  Skilled Therapeutic Interventions/Progress Updates:    Treatment session with focus on activity tolerance and functional use of LUE.  Pt received upright in w/c agreeable to therapy session.  Pt completed 8 mins on Nustep for endurance with focus on maintaining consistent step speed.  Engaged in pipe tree puzzle in sitting with focus on LUE fine and gross motor control.  Pt required min question cues to identify and correct errors.  Returned to room via w/c and reports need to toilet.  Pt completed toileting with CGA and returned to bed min assist for control during transfer.   Therapy Documentation Precautions:  Precautions Precautions: Fall Restrictions Weight Bearing Restrictions: No General:   Vital Signs: Therapy Vitals Temp: 97.7 F (36.5 C) Temp Source: Oral Pulse Rate: 88 Resp: 20 BP: 139/63 Patient Position (if appropriate): Sitting Oxygen Therapy SpO2: 100 % O2 Device: Room Air Pain:  Pt with no c/o pain   Therapy/Group: Individual Therapy  [name] 07/17/2018, 4:02 PM

## 2018-07-17 NOTE — Progress Notes (Signed)
Social Work  Social Work Assessment and Plan  Patient Details  Name: Andre Wolfe MRN: [id number] Date of Birth: Mar 07, 1935  Today's Date: 07/17/2018  Problem List:  Patient Active Problem List   Diagnosis Date Noted  . Right pontine stroke ([place]) 07/16/2018  . Acute ischemic stroke ([place])   . Essential hypertension   . Poorly controlled type 2 diabetes mellitus with peripheral neuropathy ([place])   . Tobacco abuse   . PAF (paroxysmal atrial fibrillation) ([place])   . Right pontine CVA ([place]) 07/13/2018  . Hypercholesterolemia 05/04/2018   Past Medical History:  Past Medical History:  Diagnosis Date  . Arrhythmia    post op Afib.  . Arthritis    "joints" (07/13/2018)  . Coronary artery disease    a. s/p CABG 12/2015.  Marland Kitchen CVA (cerebral vascular accident) ([place]) 07/13/2018   right pontine infarct; "said he's had one in the past; probably 2 yrs ago; same symptoms" (07/13/2018)  . Diabetic peripheral neuropathy ([place])   . GERD (gastroesophageal reflux disease)   . Hyperlipidemia   . Hypertension   . Postoperative atrial fibrillation ([place])   . Prostate cancer ([place])    d'x 2015/2016  . TIA (transient ischemic attack)   . Type II diabetes mellitus ([place])    Past Surgical History:  Past Surgical History:  Procedure Laterality Date  . CARDIAC CATHETERIZATION  2017   "not completed; [facility]"  . CATARACT EXTRACTION W/ INTRAOCULAR LENS  IMPLANT, BILATERAL Bilateral   . CORONARY ARTERY BYPASS GRAFT  2017   "CABG X 4"  . TONSILLECTOMY     Social History:  reports that he has been smoking. He has a 7.80 pack-year smoking history. He has never used smokeless tobacco. He reports previous alcohol use. He reports that he does not use drugs.  Family / Support Systems Marital Status: Married Patient Roles: Spouse, Parent Spouse/Significant Other: Wife [phone] Children: Becky Putters-daughter [phone] Other Supports: Blair-son in-law [phone] Anticipated Caregiver:  Wife and daughter Ability/Limitations of Caregiver: Wife is able to assist and daughter is close by to assist if needed Caregiver Availability: 24/7 Family Dynamics: Close knit family pt and wife moved down here to be closer to daughter. They have friends and family who are involved and will come by and check on them. Pt was doing well until two months ago  Social History Preferred language: English Religion: Christian Cultural Background: No issues Education: [employer] Read: Yes Write: Yes Employment Status: Retired Public relations account executive Issues: No issues Guardian/Conservator: None-according to MD pt is capable of making his own decisions while here. Wife will be here daily to provide support   Abuse/Neglect Abuse/Neglect Assessment Can Be Completed: Yes Physical Abuse: Denies Verbal Abuse: Denies Sexual Abuse: Denies Exploitation of patient/patient's resources: Denies Self-Neglect: Denies  Emotional Status Pt's affect, behavior and adjustment status: Pt is motivated to do well and really wants to work on getting his legs stronger while here. Pt wants to be able to take care of himself before going home with his wife. Pt does not want to burden his wife. with his care. Recent Psychosocial Issues: other health issues-was managing until this decline a few months ago Psychiatric History: no history deferred depression screen due to adjusting to the new unit and getting used to therapies. Will monitor and ask inpt from team,neuro-psych is off next week so will see if pt still here when he returns Substance Abuse History: No issues  Patient / Family Perceptions, Expectations & Goals Pt/Family understanding  of illness & functional limitations: Pt and wife can explain his strokes and at least now they know the reason for the decline. Have spoken with the MD and feel they have a good understanding of his condition and treatment plan going forward. Premorbid pt/family  roles/activities: Husband, father, retiree, church member, friend, etc Anticipated changes in roles/activities/participation: resume Pt/family expectations/goals: Pt states: " I want to be mobile before I go home if I can be."  Wife states: " I hope he does well here and gets stronger while here."  [employer]: None Premorbid Home Care/DME Agencies: Other (Comment)(has DME from previous admits) Transportation available at discharge: Wife and daughter Resource referrals recommended: Support group (specify)  Discharge Planning Living Arrangements: Spouse/significant other Support Systems: Spouse/significant other, Children, Other relatives, Friends/neighbors, Social worker community Type of Residence: Private residence Insurance Resources: [employer], Multimedia programmer (specify)(cigna) Financial Resources: Radio broadcast assistant Screen Referred: No Living Expenses: Own Money Management: Spouse, Patient Does the patient have any problems obtaining your medications?: No Home Management: Wife Patient/Family Preliminary Plans: Return home with wife and daughter assisting if necessary. Wife plans to be here daily to see him in therapies and the progress he makes. Both are aware of therapy evaluations and will set target LOS. Pt is glad to be here and to be getting therapies this is what he needs. Social Work Anticipated Follow Up Needs: HH/OP, Support Group  Clinical Impression Pleasant gentleman who is motivated to do well and glad to be her eon the rehab unit. His wife and daughter are supportive and involved and will assist if needed. Will work on discharge needs and provide support to pt and wife while here.  Elease Hashimoto 07/17/2018, 9:49 AM

## 2018-07-17 NOTE — Evaluation (Signed)
Occupational Therapy Assessment and Plan  Patient Details  Name: Andre Wolfe MRN: [id number] Date of Birth: 1934-10-20  OT Diagnosis: ataxia, cognitive deficits, hemiplegia affecting non-dominant side and muscle weakness (generalized) Rehab Potential: Rehab Potential (ACUTE ONLY): Good ELOS: 7-10 days   Today's Date: 07/17/2018 OT Individual Time: 1000-1100 OT Individual Time Calculation (min): 60 min     Problem List:  Patient Active Problem List   Diagnosis Date Noted  . Right pontine stroke ([place]) 07/16/2018  . Acute ischemic stroke ([place])   . Essential hypertension   . Poorly controlled type 2 diabetes mellitus with peripheral neuropathy ([place])   . Tobacco abuse   . PAF (paroxysmal atrial fibrillation) ([place])   . Right pontine CVA ([place]) 07/13/2018  . Hypercholesterolemia 05/04/2018    Past Medical History:  Past Medical History:  Diagnosis Date  . Arrhythmia    post op Afib.  . Arthritis    "joints" (07/13/2018)  . Coronary artery disease    a. s/p CABG 12/2015.  Marland Kitchen CVA (cerebral vascular accident) ([place]) 07/13/2018   right pontine infarct; "said he's had one in the past; probably 2 yrs ago; same symptoms" (07/13/2018)  . Diabetic peripheral neuropathy ([place])   . GERD (gastroesophageal reflux disease)   . Hyperlipidemia   . Hypertension   . Postoperative atrial fibrillation ([place])   . Prostate cancer ([place])    d'x 2015/2016  . TIA (transient ischemic attack)   . Type II diabetes mellitus ([place])    Past Surgical History:  Past Surgical History:  Procedure Laterality Date  . CARDIAC CATHETERIZATION  2017   "not completed; went straight to [facility]"  . CATARACT EXTRACTION W/ INTRAOCULAR LENS  IMPLANT, BILATERAL Bilateral   . CORONARY ARTERY BYPASS GRAFT  2017   "CABG X 4"  . TONSILLECTOMY      Assessment & Plan Clinical Impression: Patient is a 84 y.o. year old male with history of HTN, CAD, T2DM, peripheral neuropathy,TIAs, PAFs/p DCCV and eliquis d/c recently;who has  had declin inmobility,weakness with unsteady gait, increased fallsand slurred speech that started on 12/25. UDS negative he was admitted on 07/13/2018 with left-sided weakness with tendency to drag left lower extremity. CT head done revealing remote left occipital lobe infarct and age indeterminate right pontine infarct. MRI/MRA brain done revealing right paramedian pontine acute/early subacute infarct with punctate infarct in left parietal cortex,mild chronic microvascular ischemic changes and no large vessel occlusion or aneurysm.Patient noted to have A. fib at admission and was started on IV heparin. 2D echo showed EF 60 to 65% with no wall abnormality and grade 2 diastolic dysfunction with calcified mitral valve.Carotid Doppler showed no significant ICA stenosis and greater than 50% ECA stenosis.  Dr. [place] consulted for input and recommended Eliquis twice daily as well as addition of Repatha for dyslipidemia.Developed A. fib with RVR yesterday and has converted to NSR and cardiology recommends not to increase beta-blocker due to resting bradycardia.Therapy evaluations done revealing revealed mild dysarthria with deficits in attention recall,poor insight with lack of safety awareness, left sided weakness with balance deficits affectingfunctional status. CIR recommended for follow-up therapy.    Patient transferred to CIR on 07/16/2018 .    Patient currently requires min with basic self-care skills secondary to muscle weakness, decreased cardiorespiratoy endurance, impaired timing and sequencing, unbalanced muscle activation, decreased coordination and decreased motor planning, decreased attention to left, decreased awareness, decreased problem solving, decreased safety awareness and decreased memory and decreased standing balance, decreased postural control, hemiplegia and decreased balance strategies.  Prior to hospitalization, patient could complete ADLs with Modified  Independent/Supervision.  Patient will benefit from skilled intervention to decrease level of assist with basic self-care skills prior to discharge home with care partner.  Anticipate patient will require 24 hour supervision and follow up outpatient.  OT - End of Session Activity Tolerance: Tolerates 30+ min activity with multiple rests Endurance Deficit: Yes Endurance Deficit Description: required frequent rest breaks OT Assessment Rehab Potential (ACUTE ONLY): Good OT Patient demonstrates impairments in the following area(s): Balance;Cognition;Endurance;Motor;Perception;Safety;Sensory;Skin Integrity OT Basic ADL's Functional Problem(s): Grooming;Bathing;Dressing;Toileting OT Transfers Functional Problem(s): Toilet;Tub/Shower OT Additional Impairment(s): Fuctional Use of Upper Extremity OT Plan OT Intensity: Minimum of 1-2 x/day, 45 to 90 minutes OT Frequency: 5 out of 7 days OT Duration/Estimated Length of Stay: 7-10 days OT Treatment/Interventions: Balance/vestibular training;Cognitive remediation/compensation;Community reintegration;Discharge planning;Disease mangement/prevention;DME/adaptive equipment instruction;Functional mobility training;Neuromuscular re-education;Pain management;Patient/family education;Psychosocial support;Self Care/advanced ADL retraining;Skin care/wound managment;Therapeutic Activities;Therapeutic Exercise;UE/LE Strength taining/ROM;UE/LE Coordination activities OT Self Feeding Anticipated Outcome(s): Mod I OT Basic Self-Care Anticipated Outcome(s): Supervision OT Toileting Anticipated Outcome(s): Supervision OT Bathroom Transfers Anticipated Outcome(s): Supervision OT Recommendation Recommendations for Other Services: Therapeutic Recreation consult Therapeutic Recreation Interventions: Outing/community reintergration Patient destination: Home Follow Up Recommendations: Outpatient OT;24 hour supervision/assistance   Skilled Therapeutic Intervention OT eval  completed with discussion of rehab process, OT purpose, POC, ELOS, and goals.  ADL assessment completed with bathing and dressing at shower level. Pt ambulated to room shower with RW with min assist, min cues for attention to task and problem solving when navigating threshold in to bathroom.  Pt attempted to doff socks in standing, therapist cuing pt to sit to doff socks and when pulling underwear and pants over feet as pt with increased LOB when standing.  Pt required mod assist for one instance during standing due to LOB, otherwise requiring CGA to min assist for standing balance.  Pt left upright in w/c with seat belt alarm on and all needs in reach.  OT Evaluation Precautions/Restrictions  Precautions Precautions: Fall Pain Pain Assessment Pain Scale: 0-10 Pain Score: 0-No pain Home Living/Prior Functioning Home Living Living Arrangements: Spouse/significant other Available Help at Discharge: Family, Available 24 hours/day Type of Home: House Home Access: Level entry, Ramped entrance Home Layout: One level Bathroom Shower/Tub: Multimedia programmer: Standard Bathroom Accessibility: Yes Additional Comments: sideways with RW if needed  Lives With: Spouse IADL History Homemaking Responsibilities: No Current License: No Prior Function Level of Independence: Requires assistive device for independence, Independent with homemaking with ambulation, Independent with basic ADLs(uses cane when outside the house)  Able to Take Stairs?: Yes Driving: No Vocation: Retired(retired dairy and tobacco farmer) Comments: about 2 months ago was using a cane in the community only; went shopping with his wife - used grocery cart; does not drive ADL ADL Upper Body Bathing: Supervision/safety Where Assessed-Upper Body Bathing: Shower Lower Body Bathing: Minimal assistance Where Assessed-Lower Body Bathing: Administrator, sports Dressing: Supervision/safety Where Assessed-Upper Body Dressing:  Wheelchair Lower Body Dressing: Minimal assistance Where Assessed-Lower Body Dressing: Medical laboratory scientific officer: Minimal assistance Armed forces technical officer Method: Counselling psychologist: Grab bars(RW) Social research officer, government: Minimal assistance Social research officer, government Method: Heritage manager: Civil engineer, contracting with back, Grab bars(RW) Vision Baseline Vision/History: Wears glasses Wears Glasses: Reading only Patient Visual Report: No change from baseline Vision Assessment?: Yes Eye Alignment: Within Functional Limits Ocular Range of Motion: Within Functional Limits Alignment/Gaze Preference: Within Defined Limits Tracking/Visual Pursuits: Able to track stimulus in all quads without difficulty Saccades: Within functional limits Convergence: Within  functional limits Perception  Perception: Impaired Comments: intermittent Lt inattention with functional movement Praxis Praxis: Intact Cognition Overall Cognitive Status: Impaired/Different from baseline Arousal/Alertness: Awake/alert Orientation Level: Person;Place;Situation Person: Oriented Place: Oriented Situation: Oriented Year: 2020 Month: January Day of Week: Correct Memory: Appears intact Memory Impairment: Retrieval deficit Immediate Memory Recall: Sock;Blue;Bed Memory Recall: Bed;Blue Memory Recall Blue: With Cue Memory Recall Bed: Without Cue Awareness: Impaired Problem Solving: Impaired Safety/Judgment: Impaired Sensation Sensation Light Touch: Impaired Detail Peripheral sensation comments: intact in BUE. Premorbid diabetic Neuropathy in BLE; distal Lower leg and foot.  L slighty more impaired than the R Light Touch Impaired Details: Impaired RLE;Impaired LLE Proprioception: Impaired by gross assessment Coordination Gross Motor Movements are Fluid and Coordinated: No Coordination and Movement Description: mild dsymetria/axatia the LLE and RLE LE>UE.  Finger Nose Finger Test: dysmertric  on the L Heel Shin Test: ataxic L  Motor  Motor Motor: Hemiplegia;Ataxia Motor - Skilled Clinical Observations: mild L hemiplegia, and ataxia on the L Mobility  Bed Mobility Bed Mobility: Rolling Right;Rolling Left;Supine to Sit;Sit to Supine Rolling Right: Supervision/verbal cueing Rolling Left: Supervision/Verbal cueing Supine to Sit: Minimal Assistance - Patient > 75% Sit to Supine: Minimal Assistance - Patient > 75% Transfers Sit to Stand: Minimal Assistance - Patient > 75%  Trunk/Postural Assessment  Cervical Assessment Cervical Assessment: Within Functional Limits Thoracic Assessment Thoracic Assessment: Within Functional Limits Lumbar Assessment Lumbar Assessment: Within Functional Limits Postural Control Postural Control: Deficits on evaluation Righting Reactions: delayed posteriorly and L in sitting and standing Protective Responses: delayed posteriorly.   Balance Static Sitting Balance Static Sitting - Level of Assistance: 5: Stand by assistance Dynamic Sitting Balance Dynamic Sitting - Level of Assistance: 4: Min assist(while performing dressing task ) Static Standing Balance Static Standing - Level of Assistance: 4: Min assist Dynamic Standing Balance Dynamic Standing - Level of Assistance: 3: Mod assist(no UE support ) Extremity/Trunk Assessment RUE Assessment RUE Assessment: Within Functional Limits LUE Assessment LUE Assessment: Exceptions to Comprehensive [facility] Passive Range of Motion (PROM) Comments: WFL Active Range of Motion (AROM) Comments: shoulder flexion to 150*, mild ataxia/dysmetria with gross and fine motor movements General Strength Comments: grossly 4/5 LUE Body System: Neuro Brunstrum levels for arm and hand: Arm;Hand Brunstrum level for arm: Stage V Relative Independence from Synergy Brunstrum level for hand: Stage V Independence from basic synergies     Refer to Care Plan for Long Term Goals  Recommendations for other services: Therapeutic Recreation   Outing/community reintegration   Discharge Criteria: Patient will be discharged from OT if patient refuses treatment 3 consecutive times without medical reason, if treatment goals not met, if there is a change in medical status, if patient makes no progress towards goals or if patient is discharged from hospital.  The above assessment, treatment plan, treatment alternatives and goals were discussed and mutually agreed upon: by patient  Kendricks Reap, [facility] 07/17/2018, 11:00 AM

## 2018-07-17 NOTE — Progress Notes (Signed)
Retta Diones, RN  Rehab Admission Coordinator  Physical Medicine and Rehabilitation  PMR Pre-admission  Signed  Date of Service:  07/16/2018 1:18 PM       Related encounter: ED to Hosp-Admission (Discharged) from 07/13/2018 in [facility]      Signed         Show:Clear all [x] Manual[x] Template[x] Copied  Added by: [x] Karl Bales, Evalee Mutton, RN  [] Hover for details PMR Admission Coordinator Pre-Admission Assessment  Patient: Andre Wolfe is an 84 y.o., male MRN: [id number] DOB: [birth year] Height: 5\' 9"  (175.3 cm) Weight: 96.2 kg                                                                                                                                                  Insurance Information HMO: No   PPO:       PCP:       IPA:       80/20:       OTHER:   PRIMARY:  Medicare A and B      Policy#: [id number]      Subscriber: patient CM Name:        Phone#:       Fax#:   Pre-Cert#:        Employer: Retired Benefits:  Phone #:       Name: Checked in [place]. Date: 02/12/02     Deduct: $1408      Out of Pocket Max: None      Life Max: N/A CIR: 1005      SNF: 100 days Outpatient: 80%     Co-Pay: 20% Home Health: 100%      Co-Pay: none DME: 80%     Co-Pay: 20% Providers: patient's choice  SECONDARY: Generic [employer]      Policy#: [id number]      Subscriber: patient CM Name:        Phone#:       Fax#:   Pre-Cert#:        Employer: Retired Benefits:  Phone #:  [phone]     Name:   Eff. Date:       Deduct:        Out of Pocket Max:        Life Max:   CIR:        SNF:   Outpatient:       Co-Pay:   Home Health:        Co-Pay:   DME:       Co-Pay:    Medicaid Application Date:       Case Manager:  Disability Application Date:       Case Worker:   Emergency Publishing copy Information    Name Relation Home Work Mobile   [place], [place] Daughter  [phone]   Darcey Nora Relative   [phone]      Current Medical History  Patient Admitting Diagnosis:Right pontine infarction   History of Present Illness: An 84 year old male with history of HTN, CAD, T2DM, peripheral neuropathy,TIAs, PAFs/p DCCV and eliquis d/c recently;who has had declin inmobility,weakness with unsteady gait, increased fallsand slurred speech that started on 12/25. UDS negative he was admitted on 07/13/2018 with left-sided weakness with tendency to drag left lower extremity. CT head done revealing remote left occipital lobe infarct and age indeterminate right pontine infarct. MRI/MRA brain done revealing right paramedian pontine acute/early subacute infarct with punctate infarct in left parietal cortex,mild chronic microvascular ischemic changes and no large vessel occlusion or aneurysm.Patient noted to have A. fib at admission and was started on IV heparin. 2D echo showed EF 60 to 65% with no wall abnormality and grade 2 diastolic dysfunction with calcified mitral valve.Carotid Doppler showed no significant ICA stenosis and greater than 50% ECA stenosis.  Dr. Martinique consulted for input and recommended Eliquis twice daily as well as addition of Repatha for dyslipidemia.Developed A. fib with RVR yesterday and has converted to NSR and cardiology recommends not to increase beta-blocker due to resting bradycardia.Therapy evaluations done revealing revealed mild dysarthria with deficits in attention recall,poor insight with lack of safety awareness, left sided weakness with balance deficits affectingfunctional status. CIR recommended for follow-up therapy.  Complete NIHSS TOTAL: 1  Past Medical History      Past Medical History:  Diagnosis Date  . Arrhythmia    post op Afib.  . Arthritis    "joints" (07/13/2018)  . Coronary artery disease    a. s/p CABG 12/2015.  Marland Kitchen CVA (cerebral vascular accident) ([place]) 07/13/2018   right pontine infarct; "said he's had one in the past; probably 2 yrs ago;  same symptoms" (07/13/2018)  . Diabetic peripheral neuropathy ([place])   . GERD (gastroesophageal reflux disease)   . Hyperlipidemia   . Hypertension   . Postoperative atrial fibrillation ([place])   . Prostate cancer ([place])    d'x 2015/2016  . TIA (transient ischemic attack)   . Type II diabetes mellitus (HCC)     Family History  family history includes Breast cancer in his mother and sister; Diabetes in his brother; Heart disease in his mother; Leukemia in his father.  Prior Rehab/Hospitalizations: No previous rehab  Has the patient had major surgery during 100 days prior to admission? No  Current Medications   Current Facility-Administered Medications:  .  acetaminophen (TYLENOL) tablet 650 mg, 650 mg, Oral, Q4H PRN **OR** acetaminophen (TYLENOL) solution 650 mg, 650 mg, Per Tube, Q4H PRN **OR** acetaminophen (TYLENOL) suppository 650 mg, 650 mg, Rectal, Q4H PRN, Karmen Bongo, MD .  apixaban Arne Cleveland) tablet 5 mg, 5 mg, Oral, BID, Kroeger, Krista M., PA-C, 5 mg at 07/16/18 0912 .  gabapentin (NEURONTIN) tablet 600 mg, 600 mg, Oral, QHS, Karmen Bongo, MD, 600 mg at 07/15/18 2037 .  insulin aspart (novoLOG) injection 0-15 Units, 0-15 Units, Subcutaneous, TID WC, Karmen Bongo, MD, 3 Units at 07/16/18 1306 .  insulin aspart (novoLOG) injection 0-5 Units, 0-5 Units, Subcutaneous, QHS, Karmen Bongo, MD, 2 Units at 07/13/18 2119 .  insulin aspart protamine- aspart (NOVOLOG MIX 70/30) injection 25 Units, 25 Units, Subcutaneous, BID WC, Karmen Bongo, MD, 25 Units at 07/16/18 0912 .  insulin aspart protamine- aspart (NOVOLOG MIX 70/30) injection 5 Units, 5 Units, Subcutaneous, Q1200, Karmen Bongo, MD, 5 Units at 07/16/18 1306 .  metoprolol tartrate (LOPRESSOR) tablet 50 mg,  50 mg, Oral, BID, Martinique, Peter M, MD, 50 mg at 07/16/18 1037 .  pantoprazole (PROTONIX) EC tablet 40 mg, 40 mg, Oral, Daily, Karmen Bongo, MD, 40 mg at 07/16/18 0913 .  phenol (CHLORASEPTIC)  mouth spray 1 spray, 1 spray, Mouth/Throat, PRN, Sheikh, Omair Latif, DO .  senna-docusate (Senokot-S) tablet 1 tablet, 1 tablet, Oral, QHS PRN, Karmen Bongo, MD  Patients Current Diet:     Diet Order                  Diet - low sodium heart healthy         Diet heart healthy/carb modified Room service appropriate? Yes; Fluid consistency: Thin  Diet effective now               Precautions / Restrictions Precautions Precautions: Fall Restrictions Weight Bearing Restrictions: No   Has the patient had 2 or more falls or a fall with injury in the past year?No.  Patient reports 1 fall this past year.  Prior Activity Level Limited Community (1-2x/wk): Went out 2 X a week, wife drives.  He has a lift chair to assist him at home.  Home Assistive Devices / Equipment Home Assistive Devices/Equipment: Environmental consultant (specify type), Wheelchair, [employer] (specify quad or straight), CBG Meter, Grab bars in shower, Eyeglasses, Dentures (specify type) Home Equipment: Walker - 2 wheels, Cane - single point, Bedside commode, Shower seat, Grab bars - tub/shower, Wheelchair - manual  Prior Device Use: Indicate devices/aids used by the patient prior to current illness, exacerbation or injury? Walker and [employer]  Prior Functional Level Prior Function Level of Independence: Independent with assistive device(s) Comments: Just prior to coming to the hospital was using a RW; @ 2 months ago was using a cane in the community only; went shopping with his wife - used grocery cart; does not drive  Self Care: Did the patient need help bathing, dressing, using the toilet or eating?  Independent  Indoor Mobility: Did the patient need assistance with walking from room to room (with or without device)? Independent  Stairs: Did the patient need assistance with internal or external stairs (with or without device)? Independent  Functional Cognition: Did the patient need help planning regular tasks  such as shopping or remembering to take medications? Independent  Current Functional Level Cognition  Arousal/Alertness: Awake/alert Overall Cognitive Status: Impaired/Different from baseline Orientation Level: Oriented X4 Safety/Judgement: Decreased awareness of safety, Decreased awareness of deficits Attention: Alternating Alternating Attention: Impaired Alternating Attention Impairment: Verbal basic Memory: Impaired Memory Impairment: Retrieval deficit Awareness: Impaired Awareness Impairment: Emergent impairment Safety/Judgment: Impaired    Extremity Assessment (includes Sensation/Coordination)  Upper Extremity Assessment: Defer to OT evaluation LUE Deficits / Details: generalized weakness; moving WFL AROM with exception of shoulder flexion limited to @ 100. Poor inhand manipulation skills; strength @ 3+/5 throughout although stronger proxmially LUE Coordination: decreased fine motor, decreased gross motor  Lower Extremity Assessment: LLE deficits/detail, RLE deficits/detail RLE Deficits / Details: AROM WFL, strength hip flexion 4+/5, knee extension 4+/5 RLE Sensation: history of peripheral neuropathy LLE Deficits / Details: AROM WFL, strength hip flexion 4/5, knee extension 4-/5 LLE Sensation: history of peripheral neuropathy    ADLs  Overall ADL's : Needs assistance/impaired Eating/Feeding: Set up Grooming: Minimal assistance, Standing Grooming Details (indicate cue type and reason): Appeasr to demonstrate motor impersistance in standing LLE; "melts down" Upper Body Bathing: Minimal assistance, Sitting Lower Body Bathing: Minimal assistance, Sit to/from stand Upper Body Dressing : Minimal assistance, Sitting Lower Body Dressing: Moderate assistance,  Sit to/from stand Toilet Transfer: Minimal assistance, Ambulation, Comfort height toilet, Grab bars Toileting- Clothing Manipulation and Hygiene: Minimal assistance, Sit to/from stand Toileting - Clothing Manipulation  Details (indicate cue type and reason): vc to clean thoroughly Functional mobility during ADLs: Minimal assistance, Rolling walker, Cueing for safety, Cueing for sequencing General ADL Comments: Problem solving appeasr slow; decreased awareness at times; unaware of "draggin LLE when walking; trying to step at times when LLE not fully underneath him; posteiror lean whe attempting to donn pants; appesr unaware that he is leaning posteriorly    Mobility  Overal bed mobility: Needs Assistance Bed Mobility: Supine to Sit Supine to sit: Min assist, HOB elevated General bed mobility comments: to lift trunk, to supine assist for positioning    Transfers  Overall transfer level: Needs assistance Equipment used: Rolling walker (2 wheeled) Transfers: Sit to/from Stand Sit to Stand: Min assist General transfer comment: cues to push up from bed, sat on bed with uncontrolled descent despite cues to reach back    Ambulation / Gait / Stairs / Wheelchair Mobility  Ambulation/Gait Ambulation/Gait assistance: Herbalist (Feet): 200 Feet Assistive device: Rolling walker (2 wheeled) Gait Pattern/deviations: Step-to pattern, Step-through pattern, Decreased stride length, Decreased dorsiflexion - left, Shuffle General Gait Details: over time with L LE dragging on floor and increased assist on turns with cues to pick up foot and to stay inside walker    Posture / Balance Dynamic Sitting Balance Sitting balance - Comments: static balance okay, when testing coordination in sitting, LOB posterior and to L Balance Overall balance assessment: Needs assistance, History of Falls Sitting balance-Leahy Scale: Fair Sitting balance - Comments: static balance okay, when testing coordination in sitting, LOB posterior and to L Standing balance-Leahy Scale: Poor Standing balance comment: needs UE support for balance    Special needs/care consideration BiPAP/CPAP No CPM No Continuous Drip IV  No Dialysis No    Life Vest No Oxygen No Special Bed No Trach Size No Wound Vac (area) No   Skin No                       Bowel mgmt: Last BM 07/15/18 per patient; last documented BM 07/14/18 Bladder mgmt: Voiding in urinal Diabetic mgmt Yes, on insulin at home    Previous Home Environment Living Arrangements: Spouse/significant other  Lives With: Spouse Available Help at Discharge: Family, Available 24 hours/day Type of Home: House Home Layout: One level Home Access: Level entry, Ramped entrance Bathroom Shower/Tub: Multimedia programmer: Standard Bathroom Accessibility: No Home Care Services: No  Discharge Living Setting Plans for Discharge Living Setting: Patient's home, House, Lives with (comment)(Lives with wife.) Type of Home at Discharge: House Discharge Home Layout: One level Discharge Home Access: Ramped entrance Discharge Bathroom Shower/Tub: Walk-in shower, Door Discharge Bathroom Toilet: Standard Discharge Bathroom Accessibility: Yes How Accessible: Accessible via walker, Other (comment)(Would have to turn walker sideways.) Does the patient have any problems obtaining your medications?: No  Social/Family/Support Systems Patient Roles: Spouse, Parent(Has a wife, dtr close by, son in [place]) Contact Information: Cheryln Manly - daughter Anticipated Caregiver: wife and daughter Anticipated Caregiver's Contact Information: [name] - daughter - [phone] Ability/Limitations of Caregiver: Wife can assist, daughter is close by Caregiver Availability: 24/7 Discharge Plan Discussed with Primary Caregiver: Yes Is Caregiver In Agreement with Plan?: Yes Does Caregiver/Family have Issues with Lodging/Transportation while Pt is in Rehab?: No  Goals/Additional Needs Patient/Family Goal for Rehab: PT/OT mod I and supervision goals  Expected length of stay: 5-7 days Cultural Considerations: None Dietary Needs: Heart healthy, carb mod, thin liquids Equipment  Needs: TBD Pt/Family Agrees to Admission and willing to participate: Yes Program Orientation Provided & Reviewed with Pt/Caregiver Including Roles  & Responsibilities: Yes  Decrease burden of Care through IP rehab admission: N/A  Possible need for SNF placement upon discharge: Not planned  Patient Condition: This patient's medical and functional status has changed since the consult dated: 07/14/18 in which the Rehabilitation Physician determined and documented that the patient's condition is appropriate for intensive rehabilitative care in an inpatient rehabilitation facility. See "History of Present Illness" (above) for medical update. Functional changes are: Currently requiring min assist to ambulate 200 feet RW. Patient's medical and functional status update has been discussed with the Rehabilitation physician and patient remains appropriate for inpatient rehabilitation. Will admit to inpatient rehab today.  Preadmission Screen Completed By:  Retta Diones, 07/16/2018 1:32 PM ______________________________________________________________________   Discussed status with Dr. Naaman Plummer on 07/16/18 at 1330 and received telephone approval for admission today.  Admission Coordinator:  Retta Diones, time 1331/Date 07/16/18           Cosigned by: Meredith Staggers, MD at 07/16/2018 1:50 PM  Revision History

## 2018-07-17 NOTE — Progress Notes (Signed)
Andre Arn, MD  Physician  Physical Medicine and Rehabilitation  Consult Note  Signed  Date of Service:  07/14/2018 5:33 AM       Related encounter: ED to Hosp-Admission (Discharged) from 07/13/2018 in [facility]      Signed      Expand All Collapse All    Show:Clear all [x] Manual[x] Template[] Copied  Added by: [x] Angiulli, Lavon Paganini, PA-C[x] Posey Pronto Domenick Bookbinder, MD  [] Hover for details      Physical Medicine and Rehabilitation Consult Reason for Consult:  Left side weakness Referring Physician: [facility]   HPI: Andre Wolfe is a 84 y.o.right handed male with history of peripheral neuropathy, hypertension, hyperlipidemia, diabetes mellitus, tobacco abuse, atrial fibrillation maintained on aspirin. Per chart review, patient, and daughter,patient lives with wife. Used a straight cane prior to admission. One level home with ramped entrance. Daughter works during the day. Presented 07/13/2018 with left-sided weakness. Daughter notes patient very sedentary on a daily basis will sit and watch TV go to the bathroom and back to the couch. Cranial CT scan reviewed showing brainstem infarct.  Per report, right pontine infarction age-indeterminate. Left occipital lobe infarct appeared to be more remote. MRI/MRA showed right paramedian pons acute early subacute infarction additional punctate infarcts in the left parietal cortex. No large vessel occlusion or aneurysm. Patient did not receive TPA. Echocardiogram pending. Neurology consulted and workup currently ongoing maintained on heparin. Therapy evaluations pending. M.D. has requested physical medicine rehabilitation consult.  Review of Systems  Constitutional: Negative for chills and fever.  HENT: Negative for hearing loss.   Eyes: Negative for blurred vision and double vision.  Respiratory: Negative for cough and shortness of breath.   Cardiovascular: Negative for chest pain, palpitations and leg swelling.    Gastrointestinal: Positive for constipation. Negative for nausea.       GERD  Genitourinary: Positive for urgency.  Musculoskeletal: Positive for joint pain and myalgias.  Skin: Negative for rash.  Neurological: Positive for tingling, sensory change, speech change and focal weakness.  All other systems reviewed and are negative.      Past Medical History:  Diagnosis Date  . Arrhythmia    post op Afib.  . Arthritis    "joints" (07/13/2018)  . Coronary artery disease   . CVA (cerebral vascular accident) ([place] [facility]) 07/13/2018   right pontine infarct; "said he's had one in the past; probably 2 yrs ago; same symptoms" (07/13/2018)  . Diabetic peripheral neuropathy ([place])   . GERD (gastroesophageal reflux disease)   . Hyperlipidemia   . Hypertension   . Prostate cancer ([place])    d'x 2015/2016  . Type II diabetes mellitus ([place])         Past Surgical History:  Procedure Laterality Date  . CARDIAC CATHETERIZATION  2017   "not completed; went straight to [facility]"  . CATARACT EXTRACTION W/ INTRAOCULAR LENS  IMPLANT, BILATERAL Bilateral   . CORONARY ARTERY BYPASS GRAFT  2017   "CABG X 4"  . TONSILLECTOMY          Family History  Problem Relation Age of Onset  . Heart disease Mother   . Breast cancer Mother   . Leukemia Father   . Diabetes Brother   . Breast cancer Sister   . Stroke Neg Hx    Social History:  reports that he has been smoking. He has a 7.80 pack-year smoking history. He has never used smokeless tobacco. He reports previous alcohol use. He reports that he does not use  drugs. Allergies: No Known Allergies       Medications Prior to Admission  Medication Sig Dispense Refill  . amLODipine (NORVASC) 2.5 MG tablet Take 1 tablet (2.5 mg total) by mouth daily. 90 tablet 3  . aspirin EC 81 MG tablet Take 81 mg by mouth daily.    . [facility] OMEPRAZOLE PO Take 20 mg by mouth 4 (four) times daily.    Marland Kitchen gabapentin (NEURONTIN) 600 MG tablet Take 600 mg  by mouth at bedtime.   1  . ibuprofen (ADVIL,MOTRIN) 200 MG tablet Take 600 mg by mouth every 6 (six) hours as needed for moderate pain.    . Insulin Isophane & Regular Human (NOVOLIN 70/30 FLEXPEN) (70-30) 100 UNIT/ML PEN Inject 5-25 Units into the skin 3 (three) times daily. Taking 25 units in the Am and 5 units Midday, and 25 units before dinner.    Marland Kitchen losartan (COZAAR) 100 MG tablet Take 100 mg by mouth daily.    . metoprolol tartrate (LOPRESSOR) 25 MG tablet Take 25 mg by mouth 2 (two) times daily.   3  . Evolocumab (REPATHA SURECLICK) 916 MG/ML SOAJ Inject 140 mg into the skin every 14 (fourteen) days. 2 pen 11    Home: Home Living Family/patient expects to be discharged to:: Unsure Living Arrangements: Spouse/significant other  Functional History: Functional Status:  Mobility:  ADL:  Cognition: Cognition Orientation Level: Oriented X4  Blood pressure (!) 128/59, pulse (!) 51, temperature 98.3 F (36.8 C), temperature source Oral, resp. rate 18, height 5\' 9"  (1.753 m), weight 96.2 kg, SpO2 98 %. Physical Exam  Vitals reviewed. Constitutional: He is oriented to person, place, and time. He appears well-developed.  Obese  HENT:  Head: Normocephalic and atraumatic.  Eyes: EOM are normal. Right eye exhibits no discharge. Left eye exhibits no discharge.  Neck: Normal range of motion. Neck supple. No thyromegaly present.  Cardiovascular: Normal rate and regular rhythm.  Respiratory: Effort normal and breath sounds normal. No respiratory distress.  GI: Soft. Bowel sounds are normal. He exhibits no distension.  Musculoskeletal:     Comments: No edema or tenderness in extremities  Neurological: He is alert and oriented to person, place, and time.  Motor: Grossly 4+/5 throughout (right stronger than left) Sensation diminished to light touch plantar feet Dysarthria  Skin: Skin is warm and dry.  Psychiatric: He has a normal mood and affect. His behavior is normal.           Assessment/Plan: Diagnosis: Right pontine infarction  Labs and images (see above) independently reviewed.  Records reviewed and summated above. Stroke: Continue secondary stroke prophylaxis and Risk Factor Modification listed below:   Antiplatelet therapy:   Blood Pressure Management:  Continue current medication with prn's with permisive HTN per primary team Statin Agent:   Diabetes management:   Tobacco abuse:   Right sided hemiparesisMotor recovery: Fluoxetine  1. Does the need for close, 24 hr/day medical supervision in concert with the patient's rehab needs make it unreasonable for this patient to be served in a less intensive setting? Potentially  2. Co-Morbidities requiring supervision/potential complications: peripheral neuropathy, HTN (monitor and provide prns in accordance with increased physical exertion and pain), hyperlipidemia, DM (Monitor in accordance with exercise and adjust meds as necessary), tobacco abuse (counsel), atrial fibrillation (resume meds when appropriate) 3. Due to safety, disease management and patient education, does the patient require 24 hr/day rehab nursing? Potentially 4. Does the patient require coordinated care of a physician, rehab nurse, PT (1-2 hrs/day,  5 days/week) and OT (1-2 hrs/day, 5 days/week) to address physical and functional deficits in the context of the above medical diagnosis(es)? Potentially Addressing deficits in the following areas: balance, endurance, locomotion, strength, transferring, bathing, dressing, toileting and psychosocial support 5. Can the patient actively participate in an intensive therapy program of at least 3 hrs of therapy per day at least 5 days per week? Potentially 6. The potential for patient to make measurable gains while on inpatient rehab is TBD 7. Anticipated functional outcomes upon discharge from inpatient rehab are TBD  with PT, TBD with OT, n/a with SLP. 8. Estimated rehab length of stay to reach the  above functional goals is: TBD. 9. Anticipated D/C setting: Home 10. Anticipated post D/C treatments: HH therapy and Home excercise program 11. Overall Rehab/Functional Prognosis: good  RECOMMENDATIONS: This patient's condition is appropriate for continued rehabilitative care in the following setting: Will await completion of therapy eval's, however patient sedentary at baseline.  Will consider CIR if patient far from baseline and able to tolerate 3 hours of therapy per day after completion of medical work-up. Patient has agreed to participate in recommended program. Potentially Note that insurance prior authorization may be required for reimbursement for recommended care.  Comment: Rehab Admissions Coordinator to follow up.   I have personally performed a face to face diagnostic evaluation, including, but not limited to relevant history and physical exam findings, of this patient and developed relevant assessment and plan.  Additionally, I have reviewed and concur with the physician assistant's documentation above.   Delice Lesch, MD, ABPMR Lavon Paganini Angiulli, PA-C 07/14/2018        Revision History                   Routing History

## 2018-07-17 NOTE — Progress Notes (Signed)
Occupational Therapy Session Note  Patient Details  Name: Andre Wolfe MRN: [id number] Date of Birth: September 10, 1934  Today's Date: 07/17/2018 OT Individual Time: 1330-1415 45 min  Short Term Goals: Week 1:  OT Short Term Goal 1 (Week 1): STG = LTGs due to ELOS  Skilled Therapeutic Interventions/Progress Updates:  1;1. No pain reported. Pt transported total A to tx gym for time management. Pt standing to play game of connect 4 with forced use of LUE for [facility] as well as reaching L with CGA for standing endurance. Pt demo ffatigue after ~3 min standing and knees starting to flex with VC for wider stance and upright posture. Pt completes 9HPT RUE: 30.7 seconds and LUE 51.2 seconds. Pt completes palm<>finger translation of coins with VC for decreasing compensatory techniques. Pt ambulates back to room with VC for L foot clearance and stands over toilet to void urine using RW with VC for management over toilet. Exited session with belt alarm on, seated in w/c, call liht in reach and all needs met Therapy Documentation Precautions:  Precautions Precautions: Fall Restrictions Weight Bearing Restrictions: No General:   Vital Signs:  Pain: Pain Assessment Pain Scale: 0-10 Pain Score: 0-No pain ADL:   Therapy/Group: Individual Therapy  [name] 07/17/2018, 2:13 PM

## 2018-07-18 ENCOUNTER — Inpatient Hospital Stay (HOSPITAL_COMMUNITY): Payer: Medicare Other | Admitting: Physical Therapy

## 2018-07-18 ENCOUNTER — Inpatient Hospital Stay (HOSPITAL_COMMUNITY): Payer: Medicare Other

## 2018-07-18 ENCOUNTER — Inpatient Hospital Stay (HOSPITAL_COMMUNITY): Payer: Medicare Other | Admitting: Occupational Therapy

## 2018-07-18 ENCOUNTER — Other Ambulatory Visit: Payer: Self-pay

## 2018-07-18 LAB — GLUCOSE, CAPILLARY
GLUCOSE-CAPILLARY: 130 mg/dL — AB (ref 70–99)
GLUCOSE-CAPILLARY: 131 mg/dL — AB (ref 70–99)
Glucose-Capillary: 201 mg/dL — ABNORMAL HIGH (ref 70–99)
Glucose-Capillary: 113 mg/dL — ABNORMAL HIGH (ref 70–99)

## 2018-07-18 NOTE — Progress Notes (Signed)
Occupational Therapy Session Note  Patient Details  Name: Andre Wolfe MRN: [id number] Date of Birth: 10-Feb-1935  Today's Date: 07/18/2018 OT Individual Time: 7591-6384 OT Individual Time Calculation (min): 75 min    Short Term Goals: Week 1:  OT Short Term Goal 1 (Week 1): STG = LTGs due to ELOS  Skilled Therapeutic Interventions/Progress Updates:    OT intervention with focus on bed mobility, functional amb with RW, standing balance, sit<>stand, activity tolerance and safety awareness to increase independence with BADLs.  Pt required min A for supine>sit EOB and CGA for sitting balance.  Pt with posterior LOB X1 while seated EOB.  Pt states he tends to "fall back." Pt amb with RW with CGA to bathroom for bathing at shower level.  Pt states he has BLE neuropathy and has been experiencing balance problems for awhile prior to stroke.  Pt required CGA while standing in shower.  Pt completed dressing tasks with sit<>stand from EOB. Pt amb with RW to Day Room and completed 7 mins on Nustep for BLE strengthening (7 mins level 6 with avg SPM of 60). Pt amb with RW to [employer] and engaged in [place] activities with focus on LUE use.  Pt with some incoordination with open chain movements.  Avg RUE reaction time 1.57 secs and LUE reaction time of 1.89 secs.  Pt requrested to use bathroom and returned to room.  Pt required CGA for toileting tasks.  Pt remained in w/c with belt alarm activated and all needs within reach.   Therapy Documentation Precautions:  Precautions Precautions: Fall Restrictions Weight Bearing Restrictions: No Pain:  Pt denies pain   Therapy/Group: Individual Therapy  [name] 07/18/2018, 9:18 AM

## 2018-07-18 NOTE — Progress Notes (Signed)
[place] PHYSICAL MEDICINE & REHABILITATION PROGRESS NOTE   Subjective/Complaints:  Called by nursing, pharmacy sent up 70/30 insulin late  Review of systems denies chest pain shortness of breath nausea vomiting diarrhea or constipation Objective:   General: No acute distress Mood and affect are appropriate Heart: Regular rate and rhythm no rubs murmurs or extra sounds Lungs: Clear to auscultation, breathing unlabored, no rales or wheezes Abdomen: Positive bowel sounds, soft nontender to palpation, nondistended Extremities: No clubbing, cyanosis, or edema Skin: No evidence of breakdown, no evidence of rash Neurologic: Cranial nerves II through XII intact, motor strength is 5/5 in bilateral deltoid, bicep, tricep, grip, hip flexor, knee extensors, ankle dorsiflexor and plantar flexor Sensory exam normal sensation to light touch and proprioception in bilateral upper and lower extremities Cerebellar exam normal finger to nose to finger as well as heel to shin in bilateral upper and lower extremities Positive dysdiadochokinesis with rapid alternating supination pronation of the left upper extremity. Musculoskeletal: Full range of motion in all 4 extremities. No joint swelling No results found. Recent Labs    07/16/18 0418 07/17/18 0655  WBC 7.1 7.2  HGB 15.5 15.3  HCT 43.6 44.4  PLT 190 192   Recent Labs    07/16/18 0418 07/17/18 0655  NA 138 136  K 4.2 4.7  CL 107 106  CO2 22 18*  GLUCOSE 111* 130*  BUN 13 13  CREATININE 1.04 1.02  CALCIUM 8.9 8.8*    Intake/Output Summary (Last 24 hours) at 07/18/2018 1158 Last data filed at 07/18/2018 0810 Gross per 24 hour  Intake 720 ml  Output 1000 ml  Net -280 ml     Physical Exam: Vital Signs Blood pressure (!) 142/60, pulse (!) 56, temperature 98.6 F (37 C), temperature source Oral, resp. rate 16, height 6' (1.829 m), weight 93.3 kg, SpO2 99 %.     Assessment/Plan: 1. 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  Physiatrist is providing close team supervision and 24 hour management of active medical problems listed below.  Physiatrist and rehab team continue to assess barriers to discharge/monitor patient progress toward functional and medical goals  Care Tool:  Bathing    Body parts bathed by patient: Right arm, Left arm, Chest, Abdomen, Front perineal area, Buttocks, Right upper leg, Left upper leg, Right lower leg, Left lower leg, Face         Bathing assist Assist Level: Contact Guard/Touching assist     Upper Body Dressing/Undressing Upper body dressing   What is the patient wearing?: Pull over shirt    Upper body assist Assist Level: Supervision/Verbal cueing    Lower Body Dressing/Undressing Lower body dressing      What is the patient wearing?: Underwear/pull up, Pants     Lower body assist Assist for lower body dressing: Minimal Assistance - Patient > 75%     Toileting Toileting    Toileting assist Assist for toileting: Minimal Assistance - Patient > 75%     Transfers Chair/bed transfer  Transfers assist     Chair/bed transfer assist level: Minimal Assistance - Patient > 75%     Locomotion Ambulation   Ambulation assist      Assist level: Moderate Assistance - Patient 50 - 74% Assistive device: Hand held assist Max distance: 75   Walk 10 feet activity   Assist     Assist level: Minimal  Assistance - Patient > 75%     Walk 50 feet activity   Assist    Assist level: Moderate Assistance - Patient - 50 - 74%      Walk 150 feet activity   Assist Walk 150 feet activity did not occur: Safety/medical concerns         Walk 10 feet on uneven surface  activity   Assist     Assist level: Moderate Assistance - Patient - 50 - 74% Assistive device: Aeronautical engineer    Type of Wheelchair: Manual    Wheelchair assist level: Minimal Assistance - Patient > 75% Max wheelchair distance: 148ft    Wheelchair 50 feet with 2 turns activity    Assist        Assist Level: Minimal Assistance - Patient > 75%   Wheelchair 150 feet activity     Assist Wheelchair 150 feet activity did not occur: Safety/medical concerns        Medical Problem List and Plan: 1.Functional deficits and left hemiparesissecondary to right pontine (as well as small left parietal) embolic infarcts -CIR PT OT 2. DVT Prophylaxis/Anticoagulation: Pharmaceutical:Other (comment)eliquis 3. Pain Management:tylenol 4. Mood:team to provide ego support. Pt appears motivated and positive on exam today 5. Neuropsych: This patientiscapable of making decisions on hisown behalf. 6. Skin/Wound Care:encourage appropriate nutrition. Local care as needed 7. Fluids/Electrolytes/Nutrition:encourage PO, follow up labs on admit normal basic metabolic package 03/20/7590 8. A fib with RVR: eliquis for stroke proph -lopressor 50mg  bid for rate control -outpt follow up with cardiology Vitals:   07/18/18 0324 07/18/18 0759  BP:  (!) 142/60  Pulse: (!) 52 (!) 56  Resp:  16  Temp:    SpO2:  99%  Blood pressure control is good, heart rate control, mild bradycardia may consider reduction of Lopressor dosage 9. CAD with history of CABG: -eliquis -beta blocker, repatha 10. Allow permissive HTN, treat only bp >220/120 with gradual reduction #11.  Diabetes on 70/30 insulin per endocrinology.  Overall control is good but some lability.  Will hold off on noontime 70/30 dose today, morning dose was given at 10:30 AM and has not peaked yet CBG (last 3)  Recent Labs    07/17/18 2108 07/18/18 0642 07/18/18 1134  GLUCAP 158* 130* 201*    LOS: 2 days A FACE TO FACE EVALUATION WAS PERFORMED  Luanna Salk Libbie Bartley 07/18/2018,  11:58 AM

## 2018-07-18 NOTE — Progress Notes (Signed)
Occupational Therapy Session Note  Patient Details  Name: ISHMEL ACEVEDO MRN: [id number] Date of Birth: [birth year]  Today's Date: 07/18/2018 OT Individual Time: 1420-1536 OT Individual Time Calculation (min): 76 min   Short Term Goals: Week 1:  OT Short Term Goal 1 (Week 1): STG = LTGs due to ELOS  Skilled Therapeutic Interventions/Progress Updates:    Pt greeted in w/c with granddaughter present. ADL needs met and amenable to tx. After handwashing, escorted pt to atrium via w/c. While in gift shop, pt ambulated with RW and steady assist, maneuvering around obstacles and completing narrow turns. He also ambulated in this manner in [place]. During ambulation pt required vcs for Lt foot clearance, staying inside of device, and widening base of support. Teach back method utilized to assess understanding, with pt exhibiting carryover of understanding as tx progressed. While in atrium, pt self propelled w/c, weaving around chairs and tables to work on Lt attention and L UE grasp/release. He required mod vcs for Lt attention as he'd tend to pump only with R UE while Lt hand rested on rim of wheel. He completed ambulatory transfer to booth seat with steady assist and vcs for technique. While seated, worked on [employer] NMR/bilateral coordination while engaging in [place] activities of fastening large/small buttons and tying basic shoe knot. He required increased time and repetition to successfully tie knot, but was able to do so by himself! He then returned to w/c and was escorted back to room. Pt remained in w/c with safety belt fastened and call bell within reach.    Pt also stated that he has minimal/no sensation at the bottom of both feet. Strongly encouraged him to have family bring in sneakers to maximize safety during functional transfers at [facility] and also advised to wear sneakers in home as much as possible for diabetic safety post d/c.     Therapy Documentation Precautions:  Precautions Precautions:  Fall Restrictions Weight Bearing Restrictions: No Pain: No c/o pain during session    ADL: ADL Upper Body Bathing: Supervision/safety Where Assessed-Upper Body Bathing: Shower Lower Body Bathing: Minimal assistance Where Assessed-Lower Body Bathing: Shower Upper Body Dressing: Supervision/safety Where Assessed-Upper Body Dressing: Wheelchair Lower Body Dressing: Minimal assistance Where Assessed-Lower Body Dressing: Medical laboratory scientific officer: Minimal assistance Armed forces technical officer Method: Counselling psychologist: Grab bars(RW) Gaffer Transfer: Minimal assistance Social research officer, government Method: Heritage manager: Shower seat with back, Grab bars(RW)      Therapy/Group: Individual Therapy  Zyasia Halbleib A Ruchi Stoney 07/18/2018, 3:56 PM

## 2018-07-18 NOTE — Progress Notes (Signed)
Occupational Therapy Session Note  Patient Details  Name: Andre Wolfe MRN: [id number] Date of Birth: 02/20/1935  Today's Date: 07/18/2018 OT Individual Time: 3893-7342 OT Individual Time Calculation (min): 60 min    Short Term Goals: Week 1:  OT Short Term Goal 1 (Week 1): STG = LTGs due to ELOS  Skilled Therapeutic Interventions/Progress Updates:    Pt sitting in wc upon OT arrival.  Pt reported his weakness was his legs and his balance.  Pt ambulated to gym with RW and min to mod assist for heel toe stride and maintain balance.  Pt performed scooting movements on the mat to engage core and hips.  Bridging 10x 3 sets.  Bridging with on leg on ma 10 x 2 sets.    Needed mod assist with using LLE as stable part.   Supine with reaching side to side.  Ppt increased reach distance as he progressed with movements.   Ball carry with Both arms up and down on mat with noticeable weakness and fatigue on LUE.  Pt acknowledged the LUE weakness with this activity.  Ambulated to step and performed up/down with left and right leg x10.  Ambulated to room and left in wc with all needed in reach and safety belt on.     Therapy Documentation Precautions:  Precautions Precautions: Fall Restrictions Weight Bearing Restrictions: No   Pain: Pain Assessment Pain Scale: 0-10 Pain Score: 0-No pain ADL: ADL Upper Body Bathing: Supervision/safety Where Assessed-Upper Body Bathing: Shower Lower Body Bathing: Minimal assistance Where Assessed-Lower Body Bathing: Shower Upper Body Dressing: Supervision/safety Where Assessed-Upper Body Dressing: Wheelchair Lower Body Dressing: Minimal assistance Where Assessed-Lower Body Dressing: Medical laboratory scientific officer: Minimal assistance Armed forces technical officer Method: Counselling psychologist: Grab bars(RW) Gaffer Transfer: Minimal assistance Social research officer, government Method: Heritage manager: Shower seat with back, Grab bars(RW)              Therapy/Group: Individual Therapy  Lisa Roca 07/18/2018, 12:52 PM

## 2018-07-18 NOTE — Progress Notes (Signed)
Physical Therapy Session Note  Patient Details  Name: Andre Wolfe MRN: [id number] Date of Birth: 09/06/1934  Today's Date: 07/18/2018 PT Individual Time: 1610-1703 PT Individual Time Calculation (min): 53 min   Short Term Goals: Week 1:  PT Short Term Goal 1 (Week 1): STG=LTG due to ELOS   Skilled Therapeutic Interventions/Progress Updates:   Pt received supine in bed and agreeable to PT. Supine>sit transfer with supervision assist and min cues for technique.   Gait training with RW through hall and through day room 150ft x 2, 40ft x 2 and 30ft. Min assist progressing to supervision assist through treatment and min cues for improved step height on the L as well as improved heel contact. Noted improvement in LLE clearance throughout treatment.  Dynamic gait training to step over 1inch canes in the floor 4 x 3 with min assist progressing to CGA and min-mod cues for AD management and gait pattern to improve safety.   Nustep reciprocal movement training x 6 min + 3 mintues with short seated rest break. Min cues intermittently throughout for improved attention to the LUE and LLE as well as proper speed to reduce overall fatigue.   Dynamic balance training at dynavisionl program A, 4 rings.  Level surface 1 UE support: R 42, L36.  Level surface no UE support: R 45, L34 airex pad no UE support: R 48, L 33.  Supervision assist progressing to min assist on unlevel surface to prevent posterior LOB intermittently throughout.   Throughout treatment, Pt performed sit<> stand x 15 times with min assist progressing to supervision assist;  Mild posterior LOB initially, but able to correct after 3rd transfer. Pt returned to room and performed ambulatory transfer to bed with CGA. Sit>supine completed with supervision Assist, and left supine in bed with call bell in reach and all needs met.         Therapy Documentation Precautions:  Precautions Precautions: Fall Restrictions Weight Bearing  Restrictions: No Vital Signs: Therapy Vitals Pulse Rate: (!) 50 Resp: 18 BP: (!) 142/69 Patient Position (if appropriate): Sitting Oxygen Therapy SpO2: 100 % O2 Device: Room Air Pain: 0/10    Therapy/Group: Individual Therapy  Austin E Tucker 07/18/2018, 5:00 PM  

## 2018-07-19 LAB — GLUCOSE, CAPILLARY
Glucose-Capillary: 117 mg/dL — ABNORMAL HIGH (ref 70–99)
Glucose-Capillary: 198 mg/dL — ABNORMAL HIGH (ref 70–99)
Glucose-Capillary: 149 mg/dL — ABNORMAL HIGH (ref 70–99)
Glucose-Capillary: 157 mg/dL — ABNORMAL HIGH (ref 70–99)

## 2018-07-19 NOTE — Progress Notes (Signed)
[place] PHYSICAL MEDICINE & REHABILITATION PROGRESS NOTE   Subjective/Complaints:  No issues overnight  Review of systems denies chest pain shortness of breath nausea vomiting diarrhea or constipation Objective:   General: No acute distress Mood and affect are appropriate Heart: Regular rate and rhythm no rubs murmurs or extra sounds Lungs: Clear to auscultation, breathing unlabored, no rales or wheezes Abdomen: Positive bowel sounds, soft nontender to palpation, nondistended Extremities: No clubbing, cyanosis, or edema Skin: No evidence of breakdown, no evidence of rash Neurologic: Cranial nerves II through XII intact, motor strength is 5/5 in bilateral deltoid, bicep, tricep, grip, hip flexor, knee extensors, ankle dorsiflexor and plantar flexor Sensory exam normal sensation to light touch and proprioception in bilateral upper and lower extremities Cerebellar exam normal finger to nose to finger as well as heel to shin in bilateral upper and lower extremities Positive dysdiadochokinesis with rapid alternating supination pronation of the left upper extremity. Musculoskeletal: Full range of motion in all 4 extremities. No joint swelling No results found. Recent Labs    07/17/18 0655  WBC 7.2  HGB 15.3  HCT 44.4  PLT 192   Recent Labs    07/17/18 0655  NA 136  K 4.7  CL 106  CO2 18*  GLUCOSE 130*  BUN 13  CREATININE 1.02  CALCIUM 8.8*    Intake/Output Summary (Last 24 hours) at 07/19/2018 1108 Last data filed at 07/19/2018 0806 Gross per 24 hour  Intake 780 ml  Output 800 ml  Net -20 ml     Physical Exam: Vital Signs Blood pressure (!) 152/63, pulse (!) 51, temperature 98.6 F (37 C), resp. rate 19, height 6' (1.829 m), weight 93.3 kg, SpO2 96 %.     Assessment/Plan: 1. Functional deficits secondary to right pontine and small left parietal embolic infarcts which require 3+ hours per day of interdisciplinary therapy in a comprehensive inpatient rehab  setting.  Physiatrist is providing close team supervision and 24 hour management of active medical problems listed below.  Physiatrist and rehab team continue to assess barriers to discharge/monitor patient progress toward functional and medical goals  Care Tool:  Bathing    Body parts bathed by patient: Right arm, Left arm, Chest, Abdomen, Front perineal area, Buttocks, Right upper leg, Left upper leg, Right lower leg, Left lower leg, Face         Bathing assist Assist Level: Contact Guard/Touching assist     Upper Body Dressing/Undressing Upper body dressing   What is the patient wearing?: Pull over shirt    Upper body assist Assist Level: Supervision/Verbal cueing    Lower Body Dressing/Undressing Lower body dressing      What is the patient wearing?: Underwear/pull up, Pants     Lower body assist Assist for lower body dressing: Minimal Assistance - Patient > 75%     Toileting Toileting    Toileting assist Assist for toileting: Minimal Assistance - Patient > 75%     Transfers Chair/bed transfer  Transfers assist     Chair/bed transfer assist level: Minimal Assistance - Patient > 75%     Locomotion Ambulation   Ambulation assist      Assist level: Contact Guard/Touching assist Assistive device: Walker-rolling Max distance: 181ft   Walk 10 feet activity   Assist     Assist level: Contact Guard/Touching assist Assistive device: Walker-rolling   Walk 50 feet activity   Assist    Assist level: Contact Guard/Touching assist Assistive device: Walker-rolling    Walk 150 feet  activity   Assist Walk 150 feet activity did not occur: Safety/medical concerns  Assist level: Contact Guard/Touching assist Assistive device: Walker-rolling    Walk 10 feet on uneven surface  activity   Assist     Assist level: Moderate Assistance - Patient - 50 - 74% Assistive device: Aeronautical engineer   Type of Wheelchair:  Manual    Wheelchair assist level: Minimal Assistance - Patient > 75% Max wheelchair distance: 141ft    Wheelchair 50 feet with 2 turns activity    Assist        Assist Level: Minimal Assistance - Patient > 75%   Wheelchair 150 feet activity     Assist Wheelchair 150 feet activity did not occur: Safety/medical concerns        Medical Problem List and Plan: 1.Functional deficits and left hemiparesissecondary to right pontine (as well as small left parietal) embolic infarcts -CIR PT OT, we discussed his team conference will be this coming Wednesday at which time we will set his discharge date 2. DVT Prophylaxis/Anticoagulation: Pharmaceutical:Other (comment)eliquis 3. Pain Management:tylenol 4. Mood:team to provide ego support. Pt appears motivated and positive on exam today 5. Neuropsych: This patientiscapable of making decisions on hisown behalf. 6. Skin/Wound Care:encourage appropriate nutrition. Local care as needed 7. Fluids/Electrolytes/Nutrition:encourage PO, follow up labs on admit normal basic metabolic package 10/16/9673 8. A fib with RVR: eliquis for stroke proph -lopressor 50mg  bid for rate control -outpt follow up with cardiology Vitals:   07/18/18 2042 07/19/18 0617  BP:  (!) 152/63  Pulse: (!) 58 (!) 51  Resp:  19  Temp:  98.6 F (37 C)  SpO2:  96%  Blood pressure control, mildly elevated systolic today, heart rate control, mild bradycardia  9. CAD with history of CABG: -eliquis -beta blocker, repatha 10. Allow permissive HTN, treat only bp >220/120 with gradual reduction #11.  Diabetes on 70/30 insulin per endocrinology.  Overall control is good but some lability.  Will hold off on noontime 70/30 dose today, morning dose was given at 10:30 AM and has not peaked yet CBG (last 3)  Recent Labs    07/18/18 1711 07/18/18 2204 07/19/18 0631  GLUCAP 113* 131* 117*  Well  controlled 07/19/2018  LOS: 3 days A FACE TO [place] E Shaquisha Wynn 07/19/2018, 11:08 AM

## 2018-07-19 NOTE — Plan of Care (Signed)
  Problem: RH BLADDER ELIMINATION Goal: RH STG MANAGE BLADDER WITH ASSISTANCE Description STG Manage Bladder With min Assistance  Outcome: Not Progressing; PVR q 6

## 2018-07-20 ENCOUNTER — Inpatient Hospital Stay (HOSPITAL_COMMUNITY): Payer: Medicare Other | Admitting: Physical Therapy

## 2018-07-20 ENCOUNTER — Inpatient Hospital Stay (HOSPITAL_COMMUNITY): Payer: Medicare Other | Admitting: Occupational Therapy

## 2018-07-20 DIAGNOSIS — R001 Bradycardia, unspecified: Secondary | ICD-10-CM

## 2018-07-20 LAB — GLUCOSE, CAPILLARY
Glucose-Capillary: 128 mg/dL — ABNORMAL HIGH (ref 70–99)
Glucose-Capillary: 149 mg/dL — ABNORMAL HIGH (ref 70–99)
Glucose-Capillary: 115 mg/dL — ABNORMAL HIGH (ref 70–99)
Glucose-Capillary: 170 mg/dL — ABNORMAL HIGH (ref 70–99)

## 2018-07-20 MED ORDER — AMLODIPINE BESYLATE 2.5 MG PO TABS
2.5000 mg | ORAL_TABLET | Freq: Every day | ORAL | Status: DC
  Administered 2018-07-20 – 2018-07-26 (×7): 2.5 mg via ORAL
  Filled 2018-07-20 (×7): qty 1

## 2018-07-20 MED ORDER — METOPROLOL TARTRATE 25 MG PO TABS
25.0000 mg | ORAL_TABLET | Freq: Two times a day (BID) | ORAL | Status: DC
  Administered 2018-07-20 – 2018-07-23 (×6): 25 mg via ORAL
  Filled 2018-07-20 (×6): qty 1

## 2018-07-20 NOTE — Progress Notes (Signed)
Progress Note  Patient Name: Andre Wolfe Date of Encounter: 07/20/2018  Primary Cardiologist: Peter Martinique, MD   Subjective   Asked to see patient again for bradycardia. No CP, dyspnea, increased dizziness or palpitations  Inpatient Medications    Scheduled Meds: . amLODipine  2.5 mg Oral Daily  . apixaban  5 mg Oral BID  . feeding supplement (PRO-STAT SUGAR FREE 64)  30 mL Oral BID  . gabapentin  600 mg Oral QHS  . insulin aspart  0-15 Units Subcutaneous TID WC  . insulin aspart  0-5 Units Subcutaneous QHS  . insulin aspart protamine- aspart  25 Units Subcutaneous BID WC  . insulin aspart protamine- aspart  5 Units Subcutaneous Q1200  . metoprolol tartrate  25 mg Oral BID  . pantoprazole  40 mg Oral Daily   Continuous Infusions:  PRN Meds: acetaminophen, alum & mag hydroxide-simeth, bisacodyl, diphenhydrAMINE, guaiFENesin-dextromethorphan, phenol, polyethylene glycol, prochlorperazine **OR** prochlorperazine **OR** prochlorperazine, senna-docusate, sodium phosphate, traZODone   Vital Signs    Vitals:   07/19/18 0617 07/19/18 1444 07/19/18 1948 07/20/18 0336  BP: (!) 152/63 (!) 165/77 (!) 143/65 (!) 143/75  Pulse: (!) 51 (!) 52 (!) 54 (!) 52  Resp: 19 20 18 20   Temp: 98.6 F (37 C) 98.1 F (36.7 C) 98.8 F (37.1 C) 98.1 F (36.7 C)  TempSrc:  Oral Oral Oral  SpO2: 96% 100% 99% 98%  Weight:      Height:        Intake/Output Summary (Last 24 hours) at 07/20/2018 1618 Last data filed at 07/20/2018 1231 Gross per 24 hour  Intake 460 ml  Output 600 ml  Net -140 ml   Filed Weights   07/16/18 1629  Weight: 93.3 kg    ECG    Sinus bradycardia with first-degree AV block, cannot rule out prior septal infarct, nonspecific ST changes- Personally Reviewed  Physical Exam   GEN: No acute distress.   Neck: No JVD Cardiac: Regular and bradycardia Respiratory: Clear to auscultation bilaterally. GI: Soft, nontender, non-distended  MS: No edema Neuro:  Some residual  weakness LLE  Labs    Chemistry Recent Labs  Lab 07/15/18 0742 07/16/18 0418 07/17/18 0655  NA 139 138 136  K 3.8 4.2 4.7  CL 106 107 106  CO2 22 22 18*  GLUCOSE 204* 111* 130*  BUN 9 13 13   CREATININE 0.99 1.04 1.02  CALCIUM 9.0 8.9 8.8*  PROT 6.6 6.2* 6.2*  ALBUMIN 3.3* 3.3* 3.2*  AST 17 33 35  ALT 22 19 30   ALKPHOS 41 38 38  BILITOT 1.1 2.0* 1.8*  GFRNONAA >60 >60 >60  GFRAA >60 >60 >60  ANIONGAP 11 9 12      Hematology Recent Labs  Lab 07/15/18 0439 07/16/18 0418 07/17/18 0655  WBC 6.6 7.1 7.2  RBC 5.35 5.07 5.07  HGB 16.1 15.5 15.3  HCT 46.0 43.6 44.4  MCV 86.0 86.0 87.6  MCH 30.1 30.6 30.2  MCHC 35.0 35.6 34.5  RDW 12.3 12.5 12.3  PLT 211 190 192     Patient Profile    84 y.o. male 84 y.o.malewith a hx of CADs/p CABG 12/2015 with post-op atrial fib, reduced EF on OP note, TIAs, IDDM, HTN, HLD, peripheral neuropathy, prostate CA, GERDwho is being seen  for the evaluation ofstrokeat the request of Dr. Lorin Mercy.   Assessment & Plan    1 Bradycardia-patient has asymptomatic sinus bradycardia with heart rate of 47 on electrocardiogram.  I agree with reducing metoprolol  from 50 mg twice daily to 25 mg twice daily.  I would like to continue that dose as he has had paroxysmal atrial fibrillation with rapid ventricular response.  However no further intervention necessary.  2 atrial fibrillation with rapid ventricular response-patient had atrial fibrillation intermittently during recent hospitalization at time of CVA.  Continue metoprolol 25 mg twice daily for rate control if atrial fibrillation recurs.  Continue apixaban.  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  Continue present dose of Eliquis.  4 hypertension-blood pressure is reasonably well controlled particularly in the setting of recent CVA.  If it increases with lower dose metoprolol would advance amlodipine.  5 coronary artery disease-prior coronary artery bypass and graft.  He  will remain off of aspirin given need for apixaban.  Resume Repatha following discharge.  [facility] will sign off.   Medication Recommendations: Continue present medications at discharge. Other recommendations (labs, testing, etc): No additional cardiology testing recommended.   Follow up as an outpatient: Transition of care appointment with APP 1 week.  Follow-up Dr. Martinique 3 months.  For questions or updates, please contact [place] Please consult [email or web address] for contact info under        Signed, Kirk Ruths, MD  07/20/2018, 4:18 PM

## 2018-07-20 NOTE — Progress Notes (Signed)
Occupational Therapy Session Note  Patient Details  Name: Andre Wolfe MRN: [id number] Date of Birth: 10/03/1934  Today's Date: 07/20/2018 OT Individual Time: 1405-1450 OT Individual Time Calculation (min): 45 min    Short Term Goals: Week 1:  OT Short Term Goal 1 (Week 1): STG = LTGs due to ELOS  Skilled Therapeutic Interventions/Progress Updates:    Treatment session with focus on sit > stand, dynamic standing balance, and functional use of LUE.  Pt received upright in w/c agreeable to therapy session.  Engaged in sit > stand at [place] with focus on standing balance while alternating UE support.  Increased challenge to adding cognitive component of pressing red lights with Rt hand and green lights with Lt hand.  Pt made 1 error and omitted one.  Average reaction time with RUE 1.86 seconds and Lt: 1.96 seconds.  Noted increased arm fatigue when reaching >80* shoulder flexion.  Engaged in [place] with focus on memory and functional use of LUE.  Pt completed task in 6:14 (time limit of 5 mins) and pt completely omitted one pill bottle from task.  Discussed concerns if pt were to omit medications or misinterpret instructions.  Pt reports that his wife manages his medication for him.  Pt ambulated 100' with RW with CGA and transfer back to bed.  Pt remained semi-reclined in bed with all needs in reach.  Therapy Documentation Precautions:  Precautions Precautions: Fall Restrictions Weight Bearing Restrictions: No Pain:  Pt with no c/o pain   Therapy/Group: Individual Therapy  [name] 07/20/2018, 4:00 PM

## 2018-07-20 NOTE — Progress Notes (Signed)
Physical Therapy Session Note  Patient Details  Name: Andre Wolfe MRN: [id number] Date of Birth: [birth year]  Today's Date: 07/20/2018 PT Individual Time: [phone] and 4098-1191 PT Individual Time Calculation (min): 56 min and 27 min   Short Term Goals: Week 1:  PT Short Term Goal 1 (Week 1): STG=LTG due to ELOS   Skilled Therapeutic Interventions/Progress Updates:  Treatment 1: Pt received in w/c & agreeable to tx, denying c/o pain. Assisted pt with donning tennis shoes for time management & transported pt to gym via w/c dependent assist for time management. Pt completes sit>stand with supervision, stand>sit with CGA and cuing for hand placement on w/c armrests. Pt ambulates 100 ft + 100 ft with RW & light min assist with pt demonstrating decreased L hip flexion and foot clearance with pt reporting awareness of this. Vitals assessed at rest, during & after exercise (see below) & PA (Pam) made aware & cleared pt for therapy as long as he remains asymptomatic. Pt completed lateral step ups to L with LUE support and up to mod assist 2/2 LLE weakness with task focusing on LLE strengthening & NMR, and dynamic balance. Pt utilized kinetron in sitting then standing with UE progressing to no UE support, min assist for balance, cuing for technique, and mirror for visual feedback for upright posture with task focusing on midline orientation, dynamic balance, L NMR, and BLE strengthening. At end of session pt left in w/c with chair alarm donned, needs at hand.   At rest, sitting: HR = 93 bpm, SpO2 = 97% During gait: HR = 41 bpm Sitting after first gait trial: HR = 58 bpm  At rest, before 2nd gait trial, sitting: HR = 89 bpm During 2nd gait trial: HR = 50 bpm Sitting after 2nd gait trial: HR = 56 bpm  After using kinetron: HR = 57 bpm  Sitting in w/c at end of session:  HR = 46 bpm   Treatment 2: Pt received in bed & agreeable to tx. Pt transfers supine>sit and sit<>stand with supervision. Pt  ambulates room>chairs by elevators>gym with RW & min assist with intermittent impaired LLE foot clearance 2/2 hip flexor weakness and decreased dorsiflexion but pt reports he is aware of this. Pt requires rest break by elevators 2/2 L groin pain that pt states dissipates with rest. In gym, pt performs standing cone taps with up to mod assist for balance & task focusing on weight shifting, LLE NMR & strengthening, and coordination. Pt requires cuing to decrease speed of task to focus on safety. Pt ambulates gym>elevators without AD & min<>mod assist for balance, therapist providing manual facilitation for weight shifting R for LLE foot clearance. Pt returns to room with use of RW in same manner as noted above. Pt left sitting in recliner with all needs in reach & chair alarm donned.   Therapy Documentation Precautions:  Precautions Precautions: Fall Restrictions Weight Bearing Restrictions: No    Therapy/Group: Individual Therapy  [name] 07/20/2018, 4:05 PM

## 2018-07-20 NOTE — Progress Notes (Signed)
Occupational Therapy Session Note  Patient Details  Name: Andre Wolfe MRN: [id number] Date of Birth: 1934/09/07  Today's Date: 07/20/2018 OT Individual Time: 0962-8366 OT Individual Time Calculation (min): 58 min    Short Term Goals: Week 1:  OT Short Term Goal 1 (Week 1): STG = LTGs due to ELOS  Skilled Therapeutic Interventions/Progress Updates:    Treatment session with focus on ADL retraining, dynamic standing balance, and awareness of deficits during self-care tasks.  Pt received supine in bed requiring mod assist for bed mobility due to LOB to Lt when attempting to sit to EOB.  Pt ambulated to room shower with RW with min-mod assist due to heavy posterior lean and flexed knees.  Pt completed bathing with CGA, except Min assist when standing to wash buttocks. Mod multimodal cues throughout session for safety to sit to doff pants and underwear and then threading pants at sit > stand level.  Pt required assistance to thread LLE through underwear and pants this session.  Returned to room with heavy mod assist due to flexed position.  Completed oral care in sitting due to heavy posterior lean.  Engaged in sit > stand at high/low table in therapy gym with focus on anterior weight shift and reaching activity in standing to continue to address anterior weight shift in standing.  Pt progressed to completing sit > stand with supervision and able to retrieve resistive clothespins with LUE to further facilitate upright standing posture.  Close supervision to CGA for standing balance with alternating UE support.  Returned to room and left seated upright in w/c with seat belt alarm on and all needs in reach.  Pt required increased physical assistance with sit > stand, standing balance, and stand pivot transfers this session due to increased posterior lean and flexed knees.  Pt requiring max multimodal cues to obtain upright standing posture due to flexed knees and hips.  Pt losing balance backwards frequently  during bathing and dressing tasks this session, requiring mod-max assist to correct.  Mod assist ambulation with RW due to flexed knees and hips.  Pt also demonstrating increased difficulty and frustration with dressing tasks this session.  Notified PT of pt requiring increased assistance this session.  Therapy Documentation Precautions:  Precautions Precautions: Fall Restrictions Weight Bearing Restrictions: No General:   Vital Signs:  Pain:   ADL: ADL Upper Body Bathing: Supervision/safety Where Assessed-Upper Body Bathing: Shower Lower Body Bathing: Minimal assistance Where Assessed-Lower Body Bathing: Shower Upper Body Dressing: Supervision/safety Where Assessed-Upper Body Dressing: Wheelchair Lower Body Dressing: Minimal assistance Where Assessed-Lower Body Dressing: Medical laboratory scientific officer: Minimal assistance Armed forces technical officer Method: Counselling psychologist: Grab bars(RW) Gaffer Transfer: Minimal assistance Social research officer, government Method: Heritage manager: Civil engineer, contracting with back, Grab bars(RW) Vision   Perception    Praxis   Exercises:   Other Treatments:     Therapy/Group: Individual Therapy  [name] 07/20/2018, 8:39 AM

## 2018-07-20 NOTE — Progress Notes (Signed)
[place] PHYSICAL MEDICINE & REHABILITATION PROGRESS NOTE   Subjective/Complaints:  Per RN has had low HR on Lopressor 50mg  BID, no dizziness  Review of systems denies chest pain shortness of breath nausea vomiting diarrhea or constipation Objective:   General: No acute distress Mood and affect are appropriate Heart: Regular rate and rhythm no rubs murmurs or extra sounds Lungs: Clear to auscultation, breathing unlabored, no rales or wheezes Abdomen: Positive bowel sounds, soft nontender to palpation, nondistended Extremities: No clubbing, cyanosis, or edema Skin: No evidence of breakdown, no evidence of rash Neurologic: Cranial nerves II through XII intact, motor strength is 5/5 in bilateral deltoid, bicep, tricep, grip, hip flexor, knee extensors, ankle dorsiflexor and plantar flexor Sensory exam normal sensation to light touch and proprioception in bilateral upper and lower extremities Cerebellar exam normal finger to nose to finger as well as heel to shin in bilateral upper and lower extremities Positive dysdiadochokinesis with rapid alternating supination pronation of the left upper extremity. Musculoskeletal: Full range of motion in all 4 extremities. No joint swelling No results found. No results for input(s): WBC, HGB, HCT, PLT in the last 72 hours. No results for input(s): NA, K, CL, CO2, GLUCOSE, BUN, CREATININE, CALCIUM in the last 72 hours.  Intake/Output Summary (Last 24 hours) at 07/20/2018 0726 Last data filed at 07/20/2018 0345 Gross per 24 hour  Intake 660 ml  Output 1100 ml  Net -440 ml     Physical Exam: Vital Signs Blood pressure (!) 143/75, pulse (!) 52, temperature 98.1 F (36.7 C), temperature source Oral, resp. rate 20, height 6' (1.829 m), weight 93.3 kg, SpO2 98 %.     Assessment/Plan: 1. 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  Physiatrist is providing close team supervision and 24 hour management of active medical problems listed below.  Physiatrist and rehab team continue to assess barriers to discharge/monitor patient progress toward functional and medical goals  Care Tool:  Bathing    Body parts bathed by patient: Right arm, Left arm, Chest, Abdomen, Front perineal area, Buttocks, Right upper leg, Left upper leg, Right lower leg, Left lower leg, Face         Bathing assist Assist Level: Contact Guard/Touching assist     Upper Body Dressing/Undressing Upper body dressing   What is the patient wearing?: Pull over shirt    Upper body assist Assist Level: Supervision/Verbal cueing    Lower Body Dressing/Undressing Lower body dressing      What is the patient wearing?: Underwear/pull up, Pants     Lower body assist Assist for lower body dressing: Minimal Assistance - Patient > 75%     Toileting Toileting    Toileting assist Assist for toileting: Minimal Assistance - Patient > 75%     Transfers Chair/bed transfer  Transfers assist     Chair/bed transfer assist level: Minimal Assistance - Patient > 75%     Locomotion Ambulation   Ambulation assist      Assist level: Contact Guard/Touching assist Assistive device: Walker-rolling Max distance: 120ft   Walk 10 feet activity   Assist     Assist level: Contact Guard/Touching assist Assistive device: Walker-rolling   Walk 50 feet activity   Assist    Assist level: Contact Guard/Touching assist Assistive device: Walker-rolling    Walk 150 feet activity   Assist Walk 150 feet activity did not occur: Safety/medical  concerns  Assist level: Contact Guard/Touching assist Assistive device: Walker-rolling    Walk 10 feet on uneven surface  activity   Assist     Assist level: Moderate Assistance - Patient - 50 - 74% Assistive device: Horticulturist, commercial   Type of Wheelchair: Manual    Wheelchair assist level: Minimal Assistance - Patient > 75% Max wheelchair distance: 15ft    Wheelchair 50 feet with 2 turns activity    Assist        Assist Level: Minimal Assistance - Patient > 75%   Wheelchair 150 feet activity     Assist Wheelchair 150 feet activity did not occur: Safety/medical concerns        Medical Problem List and Plan: 1.Functional deficits and left hemiparesissecondary to right pontine (as well as small left parietal) embolic infarcts -CIR PT OT,  2. DVT Prophylaxis/Anticoagulation: Pharmaceutical:Other (comment)eliquis 3. Pain Management:tylenol 4. Mood:team to provide ego support. Pt appears motivated and positive on exam today 5. Neuropsych: This patientiscapable of making decisions on hisown behalf. 6. Skin/Wound Care:encourage appropriate nutrition. Local care as needed 7. Fluids/Electrolytes/Nutrition:encourage PO, follow up labs on admit normal basic metabolic package 12/21/4852        8. A fib with RVR: eliquis for stroke proph -lopressor 50mg  bid for rate control -outpt follow up with cardiology Vitals:   07/19/18 1948 07/20/18 0336  BP: (!) 143/65 (!) 143/75  Pulse: (!) 54 (!) 52  Resp: 18 20  Temp: 98.8 F (37.1 C) 98.1 F (36.7 C)  SpO2: 99% 98%  BP in desired range- HR on low side will reduce lopressor to 25mg  BID, restart amlodipine 2.5mg  daily 9. CAD with history of CABG: -eliquis -beta blocker, repatha 10. Allow permissive HTN, treat only bp >220/120 with gradual reduction #11.  Diabetes on 70/30 insulin per endocrinology.  Overall control is good but some lability.  Will hold off on noontime 70/30 dose today, morning dose was given at 10:30 AM and has not peaked yet CBG (last 3)  Recent Labs    07/19/18 1645 07/19/18 2057 07/20/18 0626  GLUCAP 149* 157* 128*  Well controlled  07/20/2018- cont current meds 70/30 25U qam and q pm, 5U qnoon  LOS: 4 days A FACE TO [place] E Lexii Walsh 07/20/2018, 7:26 AM

## 2018-07-21 ENCOUNTER — Other Ambulatory Visit: Payer: Self-pay

## 2018-07-21 ENCOUNTER — Inpatient Hospital Stay (HOSPITAL_COMMUNITY): Payer: Medicare Other | Admitting: Physical Therapy

## 2018-07-21 ENCOUNTER — Inpatient Hospital Stay (HOSPITAL_COMMUNITY): Payer: Medicare Other | Admitting: Occupational Therapy

## 2018-07-21 LAB — BASIC METABOLIC PANEL
Anion gap: 6 (ref 5–15)
BUN: 14 mg/dL (ref 8–23)
CO2: 26 mmol/L (ref 22–32)
Calcium: 9.1 mg/dL (ref 8.9–10.3)
Chloride: 106 mmol/L (ref 98–111)
Creatinine, Ser: 1.21 mg/dL (ref 0.61–1.24)
GFR calc Af Amer: >60 mL/min (ref >60)
GFR calc non Af Amer: 56 mL/min — ABNORMAL LOW (ref >60)
Glucose, Bld: 114 mg/dL — ABNORMAL HIGH (ref 70–99)
Potassium: 4 mmol/L (ref 3.5–5.1)
Sodium: 138 mmol/L (ref 135–145)

## 2018-07-21 LAB — GLUCOSE, CAPILLARY
GLUCOSE-CAPILLARY: 144 mg/dL — AB (ref 70–99)
Glucose-Capillary: 108 mg/dL — ABNORMAL HIGH (ref 70–99)
Glucose-Capillary: 82 mg/dL (ref 70–99)
Glucose-Capillary: 120 mg/dL — ABNORMAL HIGH (ref 70–99)

## 2018-07-21 MED ORDER — TAMSULOSIN HCL 0.4 MG PO CAPS
0.4000 mg | ORAL_CAPSULE | Freq: Every day | ORAL | Status: DC
  Administered 2018-07-21 – 2018-07-25 (×5): 0.4 mg via ORAL
  Filled 2018-07-21 (×5): qty 1

## 2018-07-21 NOTE — Significant Event (Signed)
Pt was standing at toilet attempting to void pt stated my legs feel weak pt was lowered to floor without incident gait belt applied and pt assisted to w/c and into bed. Pt denies SOB or pain. VSS no c/o. Pt in bed with 2 SR up and call light in reach

## 2018-07-21 NOTE — Progress Notes (Signed)
Physical Therapy Session Note  Patient Details  Name: Andre Wolfe MRN: [id number] Date of Birth: 01-05-1935  Today's Date: 07/21/2018 PT Individual Time: [phone] and time PT Individual Time Calculation (min): 57 min and # min   Short Term Goals: Week 1:  PT Short Term Goal 1 (Week 1): STG=LTG due to ELOS   Skilled Therapeutic Interventions/Progress Updates:  Pt received in bed & agreeable to tx. No c/o pain reported during session. Pt transfers supine>sitting EOB with hospital bed features & supervision. Pt dons tennis shoes sitting EOB with assistance to tie R shoe & posterior LOB but pt & family report this is baseline - pt able to correct once therapist brought it to his attention. Educated pt on need to maintain upright static sitting balance with pt appearing to have decreased awareness. Transported pt to gym via w/c dependent assist for time management. Pt ambulates 200 ft with RW & min assist with cuing for LLE dorsiflexion & heel strike 2/2 impaired foot clearance.  Pt completed Berg Balance Test & scored 24/56; educated pt on interpretation of score & current fall risk. Patient demonstrates increased fall risk as noted by score of 24/56 on Berg Balance Scale.  (<36= high risk for falls, close to 100%; 37-45 significant >80%; 46-51 moderate >50%; 52-55 lower >25%). Pt reports need to use restroom and is returned to room. Pt ambulates in room/bathroom with RW & min assist with continent void & BM on toilet. Pt performed hand hygiene standing at sink with cuing for squaring up to sink with RW. At end of session pt left sitting in recliner with chair alarm donned, call bell in reach & family present to supervise.  Educated pt & family on pt's Berg Score, safety recommendations & current fall risk.     Therapy Documentation Precautions:  Precautions Precautions: Fall Restrictions Weight Bearing Restrictions: No  Balance: Balance Balance Assessed: Yes Standardized Balance  Assessment Standardized Balance Assessment: Berg Balance Test Berg Balance Test Sit to Stand: Able to stand  independently using hands Standing Unsupported: Able to stand 2 minutes with supervision Sitting with Back Unsupported but Feet Supported on Floor or Stool: Able to sit safely and securely 2 minutes Stand to Sit: Uses backs of legs against chair to control descent Transfers: Able to transfer with verbal cueing and /or supervision Standing Unsupported with Eyes Closed: Able to stand 3 seconds Standing Ubsupported with Feet Together: Needs help to attain position and unable to hold for 15 seconds From Standing, Reach Forward with Outstretched Arm: Reaches forward but needs supervision From Standing Position, Pick up Object from Floor: Able to pick up shoe, needs supervision From Standing Position, Turn to Look Behind Over each Shoulder: Needs supervision when turning Turn 360 Degrees: Needs close supervision or verbal cueing Standing Unsupported, Alternately Place Feet on Step/Stool: Able to complete >2 steps/needs minimal assist Standing Unsupported, One Foot in Front: Needs help to step but can hold 15 seconds Standing on One Leg: Unable to try or needs assist to prevent fall Total Score: 24   Therapy/Group: Individual Therapy  Andre Wolfe 07/21/2018, 12:54 PM

## 2018-07-21 NOTE — Progress Notes (Signed)
Occupational Therapy Session Note  Patient Details  Name: Andre Wolfe MRN: [id number] Date of Birth: 1935-07-15  Today's Date: 07/21/2018 OT Individual Time: 0900-1000 OT Individual Time Calculation (min): 60 min    Short Term Goals: Week 1:  OT Short Term Goal 1 (Week 1): STG = LTGs due to ELOS  Skilled Therapeutic Interventions/Progress Updates:    Treatment session with focus on functional mobility and bathroom transfers.  Pt received upright in recliner with pt's wife and daughter present for therapy session.  Engaged in discussion regarding events that transpired over night and recommendations for toileting during PM at home.  Pt's wife reports they had a BSC and will need to look to see if it made the move with them, as therapist recommends BSC for nighttime use or urinal.  Pt ambulated to therapy gym with RW with close supervision to [place] as pt with increased Lt foot drag as he fatigued, one seated rest break.  Engaged in transfers to high therapy mat to simulate home bed.  Recommend use of exercise step to allow pt to step up backwards on to large step and then sit on edge of bed.  Pt able to complete with supervision, min cues for technique.  Pt completed sit > supine > sitting with min cues for technique.  Pt's family reports improved mobility and control throughout session.  Pt ambulated back to room without seated rest break with CGA progressing to min assist as he fatigued.  Pt left in supine in bed with all needs in reach.  Therapy Documentation Precautions:  Precautions Precautions: Fall Restrictions Weight Bearing Restrictions: No Pain:  Pt with no c/o pain   Therapy/Group: Individual Therapy  [name] 07/21/2018, 12:11 PM

## 2018-07-21 NOTE — Progress Notes (Signed)
[place] PHYSICAL MEDICINE & REHABILITATION PROGRESS NOTE   Subjective/Complaints:  Appreciate cardiology note, pt denies dizziness with standing , stood to void for prolonged period last noc then felt weak  Review of systems denies chest pain shortness of breath nausea vomiting diarrhea or constipation Objective:   General: No acute distress Mood and affect are appropriate Heart: Regular rate and rhythm no rubs murmurs or extra sounds Lungs: Clear to auscultation, breathing unlabored, no rales or wheezes Abdomen: Positive bowel sounds, soft nontender to palpation, nondistended Extremities: No clubbing, cyanosis, or edema Skin: No evidence of breakdown, no evidence of rash Neurologic: Cranial nerves II through XII intact, motor strength is 5/5 in bilateral deltoid, bicep, tricep, grip, hip flexor, knee extensors, ankle dorsiflexor and plantar flexor Sensory exam normal sensation to light touch and proprioception in bilateral upper and lower extremities Cerebellar exam normal finger to nose to finger as well as heel to shin in bilateral upper and lower extremities Positive dysdiadochokinesis with rapid alternating supination pronation of the left upper extremity. Musculoskeletal: Full range of motion in all 4 extremities. No joint swelling No results found. No results for input(s): WBC, HGB, HCT, PLT in the last 72 hours. Recent Labs    07/21/18 0606  NA 138  K 4.0  CL 106  CO2 26  GLUCOSE 114*  BUN 14  CREATININE 1.21  CALCIUM 9.1    Intake/Output Summary (Last 24 hours) at 07/21/2018 0731 Last data filed at 07/21/2018 0444 Gross per 24 hour  Intake 480 ml  Output 400 ml  Net 80 ml     Physical Exam: Vital Signs Blood pressure (!) 149/56, pulse (!) 51, temperature 98.7 F (37.1 C), temperature source Oral, resp. rate 17, height 6' (1.829 m), weight 93.3 kg, SpO2 96 %.    General: No acute distress Mood and affect are appropriate Heart: bradycardia irregular ,no  rubs murmurs or extra sounds Lungs: Clear to auscultation, breathing unlabored, no rales or wheezes Abdomen: Positive bowel sounds, soft nontender to palpation, nondistended Extremities: No clubbing, cyanosis, or edema Skin: No evidence of breakdown, no evidence of rash Neurologic: Cranial nerves II through XII intact, motor strength is 5/5 in bilateral deltoid, bicep, tricep, grip, hip flexor, knee extensors, ankle dorsiflexor and plantar flexor Sensory exam normal sensation to light touch and proprioception in bilateral upper and lower extremities Cerebellar exam normal finger to nose to finger as well as heel to shin in bilateral upper and lower extremities Musculoskeletal: Full range of motion in all 4 extremities. No joint swelling  Assessment/Plan: 1. 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  Physiatrist is providing close team supervision and 24 hour management of active medical problems listed below.  Physiatrist and rehab team continue to assess barriers to discharge/monitor patient progress toward functional and medical goals  Care Tool:  Bathing    Body parts bathed by patient: Right arm, Left arm, Chest, Abdomen, Front perineal area, Buttocks, Right upper leg, Left upper leg, Right lower leg, Left lower leg, Face         Bathing assist Assist Level: Minimal Assistance - Patient > 75%     Upper Body Dressing/Undressing Upper body dressing   What is the patient wearing?: Pull over shirt    Upper body assist Assist Level: Supervision/Verbal cueing    Lower Body Dressing/Undressing Lower body dressing      What is the  patient wearing?: Underwear/pull up, Pants     Lower body assist Assist for lower body dressing: Moderate Assistance - Patient 50 - 74%     Toileting Toileting    Toileting assist Assist for  toileting: Minimal Assistance - Patient > 75%     Transfers Chair/bed transfer  Transfers assist     Chair/bed transfer assist level: Moderate Assistance - Patient 50 - 74%     Locomotion Ambulation   Ambulation assist      Assist level: Minimal Assistance - Patient > 75% Assistive device: Walker-rolling Max distance: 150 ft    Walk 10 feet activity   Assist     Assist level: Minimal Assistance - Patient > 75% Assistive device: Walker-rolling   Walk 50 feet activity   Assist    Assist level: Minimal Assistance - Patient > 75% Assistive device: Walker-rolling    Walk 150 feet activity   Assist Walk 150 feet activity did not occur: Safety/medical concerns  Assist level: Minimal Assistance - Patient > 75% Assistive device: Walker-rolling    Walk 10 feet on uneven surface  activity   Assist     Assist level: Moderate Assistance - Patient - 50 - 74% Assistive device: Aeronautical engineer   Type of Wheelchair: Manual    Wheelchair assist level: Minimal Assistance - Patient > 75% Max wheelchair distance: 191ft    Wheelchair 50 feet with 2 turns activity    Assist        Assist Level: Minimal Assistance - Patient > 75%   Wheelchair 150 feet activity     Assist Wheelchair 150 feet activity did not occur: Safety/medical concerns        Medical Problem List and Plan: 1.Functional deficits and left hemiparesissecondary to right pontine (as well as small left parietal) embolic infarcts -CIR PT OT, team conf in am 2. DVT Prophylaxis/Anticoagulation: Pharmaceutical:Other (comment)eliquis 3. Pain Management:tylenol 4. Mood:team to provide ego support. Pt appears motivated and positive on exam today 5. Neuropsych: This patientiscapable of making decisions on hisown behalf. 6. Skin/Wound Care:encourage appropriate nutrition. Local care as needed 7. Fluids/Electrolytes/Nutrition:encourage  PO, follow up labs on admit normal basic metabolic package 02/12/2750        8. A fib with RVR: eliquis for stroke proph -lopressor 50mg  bid for rate control -outpt follow up with cardiology Vitals:   07/21/18 0427 07/21/18 0629  BP: (!) 151/62 (!) 149/56  Pulse: (!) 52 (!) 51  Resp: 16 17  Temp: 98 F (36.7 C) 98.7 F (37.1 C)  SpO2: 100% 96%  BP in desired range- HR on low side will reduce lopressor to 25mg  BID, restart amlodipine 2.5mg  daily 9. CAD with history of CABG: -eliquis -beta blocker, repatha 10. Allow permissive HTN, treat only bp >220/120 with gradual reduction #11.  Diabetes on 70/30 insulin per endocrinology.  Overall control is good but some lability.  Will hold off on noontime 70/30 dose today, morning dose was given at 10:30 AM and has not peaked yet CBG (last 3)  Recent Labs    07/20/18 1643 07/20/18 2139 07/21/18 0626  GLUCAP 115* 170* 108*  Well controlled 07/21/2018- cont current meds 70/30 25U qam and q pm, 5U qnoon  LOS: 5 days A FACE TO Carter E  07/21/2018, 7:31 AM

## 2018-07-21 NOTE — Discharge Instructions (Addendum)
Inpatient Rehab Discharge Instructions  Andre Wolfe Discharge date and time:  07/26/17  Activities/Precautions/ Functional Status: Activity: no lifting, driving, or strenuous exercise for till cleared by MD Diet: cardiac diet and diabetic diet Wound Care: none needed    Functional status:  ___ No restrictions     ___ Walk up steps independently _X__ 24/7 supervision/assistance   ___ Walk up steps with assistance ___ Intermittent supervision/assistance  ___ Bathe/dress independently ___ Walk with walker     _X__ Bathe/dress with assistance ___ Walk Independently    ___ Shower independently ___ Walk with assistance    ___ Shower with assistance _X__ No alcohol     ___ Return to work/school ________   Special Instructions: 1. Per pharmacy--Eliquis 60 pills picked up on Jan 2nd. You have to meet your deductible before they cover it. Talk to your cardiologist next week if it needs to be changed.  2. Cath once daily in morning and more frequently if you feel that you cannot empty or are having difficulty.    COMMUNITY REFERRALS UPON DISCHARGE:    Home Health:   PT & OT    Agency:[facility]   [place]   Date of last service:07/26/2018  Medical Equipment/Items [place]   [phone]   GENERAL COMMUNITY RESOURCES FOR PATIENT/FAMILY: Support Groups:[facility] SUPPORT GROUP THE SECOND Thursday @ 6:00-7:00 PM ON THE REHAB UNIT QUESTIONS CONTACT AMY [phone]  STROKE/TIA DISCHARGE INSTRUCTIONS SMOKING Cigarette smoking nearly doubles your risk of having a stroke & is the single most alterable risk factor  If you smoke or have smoked in the last 12 months, you are advised to quit smoking for your health.  Most of the excess cardiovascular risk related to smoking disappears within a year of stopping.  Ask you doctor about anti-smoking medications  [place] Quit Line: 1-800-QUIT NOW  Free Smoking Cessation Classes [phone]  [phone]  CHOLESTEROL Know your levels; limit fat & cholesterol in your diet  Lipid Panel     Component Value Date/Time   CHOL 211 (H) 07/14/2018 0013   TRIG 139 07/14/2018 0013   HDL 30 (L) 07/14/2018 0013   CHOLHDL 7.0 07/14/2018 0013   VLDL 28 07/14/2018 0013   LDLCALC 153 (H) 07/14/2018 0013      Many patients benefit from treatment even if their cholesterol is at goal.  Goal: Total Cholesterol (CHOL) less than 160  Goal:  Triglycerides (TRIG) less than 150  Goal:  HDL greater than 40  Goal:  LDL (LDLCALC) less than 100   BLOOD PRESSURE American Stroke Association blood pressure target is less that 120/80 mm/Hg  Your discharge blood pressure is:  BP: (!) 165/66  Monitor your blood pressure  Limit your salt and alcohol intake  Many individuals will require more than one medication for high blood pressure  DIABETES (A1c is a blood sugar average for last 3 months) Goal HGBA1c is under 7% (HBGA1c is blood sugar average for last 3 months)  Diabetes:     Lab Results  Component Value Date   HGBA1C 8.9 (H) 07/14/2018     Your HGBA1c can be lowered with medications, healthy diet, and exercise.  Check your blood sugar as directed by your physician  Call your physician if you experience unexplained or low blood sugars.  PHYSICAL ACTIVITY/REHABILITATION Goal is 30 minutes at least 4 days per week  Activity: No driving, Therapies: see above Return to work: N/A  Activity decreases your risk of  heart attack and stroke and makes your heart stronger.  It helps control your weight and blood pressure; helps you relax and can improve your mood.  Participate in a regular exercise program.  Talk with your doctor about the best form of exercise for you (dancing, walking, swimming, cycling).  DIET/WEIGHT Goal is to maintain a healthy weight  Your discharge diet is:  Diet Order            Diet heart healthy/carb modified Room service appropriate? Yes; Fluid consistency: Thin  Diet  effective now             liquids Your height is:  Height: 6' (182.9 cm) Your current weight is: Weight: 93.3 kg Your Body Mass Index (BMI) is:  BMI (Calculated): 27.89  Following the type of diet specifically designed for you will help prevent another stroke.  Your goal weight is: 184 lbs  Your goal Body Mass Index (BMI) is 19-24.  Healthy food habits can help reduce 3 risk factors for stroke:  High cholesterol, hypertension, and excess weight.  RESOURCES Stroke/Support Group:  Call [phone]   STROKE EDUCATION PROVIDED/REVIEWED AND GIVEN TO PATIENT Stroke warning signs and symptoms How to activate emergency medical system (call 911). Medications prescribed at discharge. Need for follow-up after discharge. Personal risk factors for stroke. Pneumonia vaccine given:  Flu vaccine given:  My questions have been answered, the writing is legible, and I understand these instructions.  I will adhere to these goals & educational materials that have been provided to me after my discharge from the hospital.     My questions have been answered and I understand these instructions. I will adhere to these goals and the provided educational materials after my discharge from the hospital.  Patient/Caregiver Signature _______________________________ Date __________  Clinician Signature _______________________________________ Date __________  Please bring this form and your medication list with you to all your follow-up doctor's appointments. Information on my medicine - ELIQUIS (apixaban)  Why was Eliquis prescribed for you? Eliquis was prescribed for you to reduce the risk of a blood clot forming that can cause a stroke if you have a medical condition called atrial fibrillation (a type of irregular heartbeat).  What do You need to know about Eliquis ? Take your Eliquis TWICE DAILY - one tablet in the morning and one tablet in the evening with or without food. If you have difficulty  swallowing the tablet whole please discuss with your pharmacist how to take the medication safely.  Take Eliquis exactly as prescribed by your doctor and DO NOT stop taking Eliquis without talking to the doctor who prescribed the medication.  Stopping may increase your risk of developing a stroke.  Refill your prescription before you run out.  After discharge, you should have regular check-up appointments with your healthcare provider that is prescribing your Eliquis.  In the future your dose may need to be changed if your kidney function or weight changes by a significant amount or as you get older.  What do you do if you miss a dose? If you miss a dose, take it as soon as you remember on the same day and resume taking twice daily.  Do not take more than one dose of ELIQUIS at the same time to make up a missed dose.  Important Safety Information A possible side effect of Eliquis is bleeding. You should call your healthcare provider right away if you experience any of the following: ? Bleeding from an injury or your nose  that does not stop. ? Unusual colored urine (red or dark brown) or unusual colored stools (red or black). ? Unusual bruising for unknown reasons. ? A serious fall or if you hit your head (even if there is no bleeding).  Some medicines may interact with Eliquis and might increase your risk of bleeding or clotting while on Eliquis. To help avoid this, consult your healthcare provider or pharmacist prior to using any new prescription or non-prescription medications, including herbals, vitamins, non-steroidal anti-inflammatory drugs (NSAIDs) and supplements.  This website has more information on Eliquis (apixaban): http://www.eliquis.com/eliquis/home

## 2018-07-21 NOTE — Progress Notes (Signed)
Occupational Therapy Session Note  Patient Details  Name: Andre Wolfe MRN: [id number] Date of Birth: Nov 21, 1934  Today's Date: 07/21/2018 OT Individual Time: 1400-1500 OT Individual Time Calculation (min): 60 min    Short Term Goals: Week 1:  OT Short Term Goal 1 (Week 1): STG = LTGs due to ELOS  Skilled Therapeutic Interventions/Progress Updates:    Treatment session with focus on sit > stand, dynamic standing balance, and overall activity tolerance.  Pt received supine in bed willing to engage in therapy session.  Completed bed mobility with min cues for technique.  Pt completed stand pivot transfer to w/c with CGA.  Engaged in dynamic standing balance and sit > stand while engaging in Wii activities.  Engaged in sit > stand and standing tolerance while completing Wii bowling activity with alternating between one and no UE support on RW for stability.  Progressed to challenge on Wii balance board with focus on weight shifting laterally and anterior/posterior.  Pt with increased difficulty due to peripheral neuropathy in feet, with difficulty grading movements.  Ambulated back to room with RW and CGA and transferred to recliner.  Pt left upright in recliner with seat belt alarm on, all needs in reach, and wife present.  Therapy Documentation Precautions:  Precautions Precautions: Fall Restrictions Weight Bearing Restrictions: No General:   Vital Signs: Therapy Vitals Temp: 98.1 F (36.7 C) Temp Source: Oral Pulse Rate: (!) 56 Resp: 18 BP: (!) 165/69 Patient Position (if appropriate): Lying Oxygen Therapy SpO2: 99 % O2 Device: Room Air Pain:  Pt with no c/o pain   Therapy/Group: Individual Therapy  [name] 07/21/2018, 8:33 PM

## 2018-07-22 ENCOUNTER — Inpatient Hospital Stay (HOSPITAL_COMMUNITY): Payer: Medicare Other | Admitting: Physical Therapy

## 2018-07-22 ENCOUNTER — Inpatient Hospital Stay (HOSPITAL_COMMUNITY): Payer: Medicare Other

## 2018-07-22 ENCOUNTER — Inpatient Hospital Stay (HOSPITAL_COMMUNITY): Payer: Medicare Other | Admitting: Occupational Therapy

## 2018-07-22 LAB — GLUCOSE, CAPILLARY
GLUCOSE-CAPILLARY: 169 mg/dL — AB (ref 70–99)
Glucose-Capillary: 99 mg/dL (ref 70–99)
Glucose-Capillary: 86 mg/dL (ref 70–99)
Glucose-Capillary: 140 mg/dL — ABNORMAL HIGH (ref 70–99)

## 2018-07-22 MED ORDER — ACETAMINOPHEN 325 MG PO TABS: 325.0000 mg | ORAL_TABLET | ORAL | Status: AC | PRN

## 2018-07-22 NOTE — Progress Notes (Signed)
[place] PHYSICAL MEDICINE & REHABILITATION PROGRESS NOTE   Subjective/Complaints:  No issues overnite  Review of systems denies chest pain shortness of breath nausea vomiting diarrhea or constipation Objective:   General: No acute distress Mood and affect are appropriate Heart: Regular rate and rhythm no rubs murmurs or extra sounds Lungs: Clear to auscultation, breathing unlabored, no rales or wheezes Abdomen: Positive bowel sounds, soft nontender to palpation, nondistended Extremities: No clubbing, cyanosis, or edema Skin: No evidence of breakdown, no evidence of rash Neurologic: Cranial nerves II through XII intact, motor strength is 5/5 in bilateral deltoid, bicep, tricep, grip, hip flexor, knee extensors, ankle dorsiflexor and plantar flexor Sensory exam normal sensation to light touch and proprioception in bilateral upper and lower extremities Cerebellar exam normal finger to nose to finger as well as heel to shin in bilateral upper and lower extremities Positive dysdiadochokinesis with rapid alternating supination pronation of the left upper extremity. Musculoskeletal: Full range of motion in all 4 extremities. No joint swelling No results found. No results for input(s): WBC, HGB, HCT, PLT in the last 72 hours. Recent Labs    07/21/18 0606  NA 138  K 4.0  CL 106  CO2 26  GLUCOSE 114*  BUN 14  CREATININE 1.21  CALCIUM 9.1    Intake/Output Summary (Last 24 hours) at 07/22/2018 0717 Last data filed at 07/22/2018 0001 Gross per 24 hour  Intake 270 ml  Output 400 ml  Net -130 ml     Physical Exam: Vital Signs Blood pressure (!) 112/46, pulse (!) 50, temperature 98.6 F (37 C), temperature source Oral, resp. rate 20, height 6' (1.829 m), weight 93.3 kg, SpO2 98 %.    General: No acute distress Mood and affect are appropriate Heart: bradycardia irregular ,no rubs murmurs or extra sounds Lungs: Clear to auscultation, breathing unlabored, no rales or  wheezes Abdomen: Positive bowel sounds, soft nontender to palpation, nondistended Extremities: No clubbing, cyanosis, or edema Skin: No evidence of breakdown, no evidence of rash Neurologic: Cranial nerves II through XII intact, motor strength is 5/5 in bilateral deltoid, bicep, tricep, grip, hip flexor, knee extensors, ankle dorsiflexor and plantar flexor Sensory exam normal sensation to light touch and proprioception in bilateral upper and lower extremities Cerebellar exam normal finger to nose to finger as well as heel to shin in bilateral upper and lower extremities Musculoskeletal: Full range of motion in all 4 extremities. No joint swelling  Assessment/Plan: 1. 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  Physiatrist is providing close team supervision and 24 hour management of active medical problems listed below.  Physiatrist and rehab team continue to assess barriers to discharge/monitor patient progress toward functional and medical goals  Care Tool:  Bathing    Body parts bathed by patient: Right arm, Left arm, Chest, Abdomen, Front perineal area, Buttocks, Right upper leg, Left upper leg, Right lower leg, Left lower leg, Face         Bathing assist Assist Level: Minimal Assistance - Patient > 75%     Upper Body Dressing/Undressing Upper body dressing   What is the patient wearing?: Pull over shirt    Upper body assist Assist Level: Supervision/Verbal cueing    Lower Body Dressing/Undressing Lower body dressing      What is the patient wearing?: Underwear/pull up, Pants     Lower body assist Assist for lower body dressing:  Moderate Assistance - Patient 50 - 74%     Toileting Toileting    Toileting assist Assist for toileting: Minimal Assistance - Patient > 75%     Transfers Chair/bed transfer  Transfers  assist     Chair/bed transfer assist level: Contact Guard/Touching assist     Locomotion Ambulation   Ambulation assist      Assist level: Minimal Assistance - Patient > 75% Assistive device: Walker-rolling Max distance: 200 ft   Walk 10 feet activity   Assist     Assist level: Minimal Assistance - Patient > 75% Assistive device: Walker-rolling   Walk 50 feet activity   Assist    Assist level: Minimal Assistance - Patient > 75% Assistive device: Walker-rolling    Walk 150 feet activity   Assist Walk 150 feet activity did not occur: Safety/medical concerns  Assist level: Minimal Assistance - Patient > 75% Assistive device: Walker-rolling    Walk 10 feet on uneven surface  activity   Assist     Assist level: Moderate Assistance - Patient - 50 - 74% Assistive device: Aeronautical engineer   Type of Wheelchair: Manual    Wheelchair assist level: Minimal Assistance - Patient > 75% Max wheelchair distance: 14f    Wheelchair 50 feet with 2 turns activity    Assist        Assist Level: Minimal Assistance - Patient > 75%   Wheelchair 150 feet activity     Assist Wheelchair 150 feet activity did not occur: Safety/medical concerns        Medical Problem List and Plan: 1.Functional deficits and left hemiparesissecondary to right pontine (as well as small left parietal) embolic infarcts -CIR PT OT, Team conference today please see physician documentation under team conference tab, met with team face-to-face to discuss problems,progress, and goals. Formulized individual treatment plan based on medical history, underlying problem and comorbidities. 2. DVT Prophylaxis/Anticoagulation: Pharmaceutical:Other (comment)eliquis 3. Pain Management:tylenol 4. Mood:team to provide ego support. Pt appears motivated and positive on exam today 5. Neuropsych: This patientiscapable of making decisions on  hisown behalf. 6. Skin/Wound Care:encourage appropriate nutrition. Local care as needed 7. Fluids/Electrolytes/Nutrition:encourage PO, follow up labs on admit normal basic metabolic package 19/01/3531       8. A fib with RVR: eliquis for stroke proph -lopressor 552mbid for rate control -outpt follow up with cardiology Vitals:   07/21/18 2011 07/22/18 0417  BP: (!) 165/69 (!) 112/46  Pulse: (!) 56 (!) 50  Resp: 18 20  Temp: 98.1 F (36.7 C) 98.6 F (37 C)  SpO2: 99% 98%  BP in desired range- HR on low side will reduce lopressor to 2532mID, restarted amlodipine 2.5mg46mily 07/22/18 9. CAD with history of CABG: -eliquis -beta blocker, repatha 10. Allow permissive HTN, treat only bp >220/120 with gradual reduction #11.  Diabetes on 70/30 insulin per endocrinology.  Overall control is good but some lability.  Will hold off on noontime 70/30 dose today, morning dose was given at 10:30 AM and has not peaked yet CBG (last 3)  Recent Labs    07/21/18 1649 07/21/18 2201 07/22/18 0626  GLUCAP 82 120* 99  Well controlled 07/22/2018- cont current meds 70/30 25U qam and q pm, 5U qnoon  LOS: 6 days A FACE TO FACE EVALUATION WAS PERFORMED  AndrCharlett Blake/2020, 7:17 AM

## 2018-07-22 NOTE — Progress Notes (Addendum)
Physical Therapy Session Note  Patient Details  Name: Andre Wolfe MRN: [id number] Date of Birth: 06-19-1935  Today's Date: 07/22/2018 PT Individual Time: 2130-8657 PT Individual Time Calculation (min): 73 min   Short Term Goals: Week 1:  PT Short Term Goal 1 (Week 1): STG=LTG due to ELOS   Skilled Therapeutic Interventions/Progress Updates:  Pt received in bed & agreeable to tx. Pt transfers to sitting EOB with supervision, hospital bed features & extra time. Pt transfers to w/c via stand pivot with CGA. Pt with heavy reliance on BUE for sit>stand and transfers, reporting his legs are weak. Transported pt to gym via w/c dependent assist for time management. Gait training x 100 ft with CGA, RW & cuing for increased heel strike & dorsiflexion LLE with good return demo but decreased foot clearance as pt fatigues. Pt then ambulates 100 ft x 2 + 60 ft x 2 with L foot-up brace and 100 ft with L AFO with more improvement, although not completely corrected, with use of L foot-up vs AFO with pt reporting noticeable difference as well. Have asked for order for pt to receive his own personal L foot-up brace; pt able to ambulate with CGA progressing to close supervision with use of orthosis. Pt performed 10x sit<>stand x 2 trials without BUE support and close supervision with cuing for increased eccentric control with stand>sit with task focusing on BLE strengthening in functional task but pt did require up to mod assist for last 2 reps of 2nd trial 2/2 BLE fatigue. Pt with decreased awareness as he reports he does not need a sitting break but then reports LE fatigue with therapist educating him. Pt negotiates 4 steps with B rails & up to min assist with MAX instructional cuing for compensatory pattern. Pt utilized nu-step on level 6 x 5 minutes with BLE only with task focusing on BLE strengthening & LLE NMR with pt requiring cuing to maintain BLE placement on pedals. At end of session pt left in recliner with chair  alarm donned & all needs in reach. Pt continues to demonstrate decreased awareness as he reports he feels he can get up in his room by himself with therapist educating him on continued need for assistance for all mobility.   No c/o pain during session.  Therapy Documentation Precautions:  Precautions Precautions: Fall Restrictions Weight Bearing Restrictions: No    Therapy/Group: Individual Therapy  [name] 07/22/2018, 9:30 AM

## 2018-07-22 NOTE — Plan of Care (Signed)
  Problem: Consults Goal: RH STROKE PATIENT EDUCATION Description See Patient Education module for education specifics  Outcome: Progressing   Problem: RH BLADDER ELIMINATION Goal: RH STG MANAGE BLADDER WITH ASSISTANCE Description STG Manage Bladder With min Assistance  Outcome: Progressing   Problem: RH SKIN INTEGRITY Goal: RH STG SKIN FREE OF INFECTION/BREAKDOWN Description Skin free of infection/breakdown with min assist  Outcome: Progressing   Problem: RH SAFETY Goal: RH STG ADHERE TO SAFETY PRECAUTIONS W/ASSISTANCE/DEVICE Description STG Adhere to Safety Precautions With min Assistance/Device.  Outcome: Progressing   Problem: RH KNOWLEDGE DEFICIT Goal: RH STG INCREASE KNOWLEDGE OF HYPERTENSION Outcome: Progressing Goal: RH STG INCREASE KNOWLEGDE OF HYPERLIPIDEMIA Outcome: Progressing Goal: RH STG INCREASE KNOWLEDGE OF STROKE PROPHYLAXIS Outcome: Progressing

## 2018-07-22 NOTE — Progress Notes (Signed)
Orthopedic Tech Progress Note Patient Details:  Andre Wolfe [birth year] [id number] Called in brace order Patient ID: AMI [name], male   DOB: [birth year], 84 y.o.   MRN: [id number]   [name] 07/22/2018, 12:41 PM

## 2018-07-22 NOTE — Progress Notes (Signed)
Occupational Therapy Session Note  Patient Details  Name: Andre Wolfe MRN: [id number] Date of Birth: 1935-04-11  Today's Date: 07/22/2018 OT Individual Time: 4465-2076 OT Individual Time Calculation (min): 26 min    Short Term Goals: Week 1:  OT Short Term Goal 1 (Week 1): STG = LTGs due to ELOS  Skilled Therapeutic Interventions/Progress Updates:    1:1. No pain reported. Pt stand pivot transfer with min A d/t LE fatigue EOB>w/c. Pt completes 9HPT with RUE 33 seconds, LUE 43 seconds demonstrating 8 seconds improvement from evaluation day. Pt stands with S with BUE pushing on armrests of w/c to complete matching nut/bolt/washer with forced use of LUE to manipulate for improved FMC/NMR with familiar objects. Pt returned to EOB with stand pivot with Supervision with VC for hand placement on bed rail/armrest. Exited session with pt seated in w/c, call light in reach and all needs met.  Therapy Documentation Precautions:  Precautions Precautions: Fall Restrictions Weight Bearing Restrictions: No   Therapy/Group: Individual Therapy  [name] 07/22/2018, 11:45 AM

## 2018-07-22 NOTE — Progress Notes (Signed)
Occupational Therapy Session Note  Patient Details  Name: Andre Wolfe MRN: [id number] Date of Birth: February 18, 1935  Today's Date: 07/22/2018 OT Individual Time: 1400-1445 OT Individual Time Calculation (min): 45 min    Short Term Goals: Week 1:  OT Short Term Goal 1 (Week 1): STG = LTGs due to ELOS  Skilled Therapeutic Interventions/Progress Updates:    Treatment session with focus on LUE NMR and strengthening. Pt received semi-reclined in bed agreeable to therapy session.  Pt ambulated to bathroom with RW with CGA and completed toileting tasks in standing with supervision.  Pt returned to sink and completed hand hygiene in standing with close supervision. Engaged in [place]or and strengthening in standing, rolling ball up wall overhead to address increased shoulder flexion > 90*.  Pt able to achieve full flexion overhead with functional reaching with ball with cues to attend to full range of LUE.  Engaged in strengthening with focus on symmetrical movements with overhead reaching and chest presses with 2# medicine ball.  Intermittent cues for symmetry with LUE.  Pt returned to room and transferred back to bed with close supervision.  Therapy Documentation Precautions:  Precautions Precautions: Fall Restrictions Weight Bearing Restrictions: No Pain:  Pt with no c/o pain   Therapy/Group: Individual Therapy  [name] 07/22/2018, 4:09 PM

## 2018-07-22 NOTE — Patient Care Conference (Signed)
Inpatient RehabilitationTeam Conference and Plan of Care Update Date: 07/22/2018   Time: 10:35 AM    Patient Name: Andre Wolfe      Medical Record Number: [id number]  Date of Birth: [birth year] Sex: Male         Room/Bed: 4W05C/4W05C-01 Payor Info: Payor: MEDICARE / Plan: MEDICARE PART A AND B / Product Type: *No Product type* /    Admitting Diagnosis: R pontone CVA  Admit Date/Time:  07/16/2018  4:07 PM Admission Comments: No comment available   Primary Diagnosis:  <principal problem not specified> Principal Problem: <principal problem not specified>  Patient Active Problem List   Diagnosis Date Noted  . Bradycardia   . Right pontine stroke ([place]) 07/16/2018  . Acute ischemic stroke ([place])   . Essential hypertension   . Poorly controlled type 2 diabetes mellitus with peripheral neuropathy ([place])   . Tobacco abuse   . PAF (paroxysmal atrial fibrillation) ([place])   . Right pontine CVA ([place]) 07/13/2018  . Hypercholesterolemia 05/04/2018    Expected Discharge Date: Expected Discharge Date: 07/26/18  Team Members Present: Physician leading conference: Dr. Alysia Penna Social Worker Present: Ovidio Kin, LCSW Nurse Present: Other (comment)(Christal bennett-LPN) PT Present: Lavone Nian, PT OT Present: [name], OT SLP Present: Stormy Fabian, SLP PPS Coordinator present : Gunnar Fusi     Current Status/Progress Goal Weekly Team Focus  Medical   Bradycardia and labile blood pressures.  Normotensive, no dizziness with standing  Medication management   Bowel/Bladder   PVR and encourage to void, s/p Prostatic Cancer Hx , Contient of BM,   Free of   Assess toileting need, and address issues   Swallow/Nutrition/ Hydration             ADL's   min assist - CGA bathing and dressing, CGA ambulation and transfers with RW  Supervision  ADL retraining, activity tolerance, endurance, bathroom transfers, dynamic standing balance   Mobility   min assist overall with RW, decreased  emergent awareness  supervision overall with LRAD, except mod I bed mobility & bed<>w/c  safety awareness, balance, endurance, gait, AFO trial, stair negotiation, pt/family education & d/c planning   Communication             Safety/Cognition/ Behavioral Observations            Pain   Denies pain  < 3  QS assesand medication    Skin                *See Care Plan and progress notes for long and short-term goals.     Barriers to Discharge  Current Status/Progress Possible Resolutions Date Resolved   Physician    Medical stability     Progressing toward goals  See above, will need cardiology follow-up as outpatient      Nursing                  PT                    OT                  SLP                SW                Discharge Planning/Teaching Needs:  Home with wife who can assist with his care has been here yesterday to observe him in therapies.      Team Discussion:  Goals supervision level due to balance issues and needs cueing at times. Left leg weakness and will need AFO prior to discharge home. BP up and down MD adjusting meds. Berg 24/56 high risk to fall. Wife here yesterday for therapies.   Revisions to Treatment Plan:  DC 1/12    Continued Need for Acute Rehabilitation Level of Care: The patient requires daily medical management by a physician with specialized training in physical medicine and rehabilitation for the following conditions: Daily direction of a multidisciplinary physical rehabilitation program to ensure safe treatment while eliciting the highest outcome that is of practical value to the patient.: Yes Daily medical management of patient stability for increased activity during participation in an intensive rehabilitation regime.: Yes Daily analysis of laboratory values and/or radiology reports with any subsequent need for medication adjustment of medical intervention for : Cardiac problems;Neurological problems   I attest that I was present,  lead the team conference, and concur with the assessment and plan of the team.   Elease Hashimoto 07/22/2018, 12:03 PM

## 2018-07-22 NOTE — Progress Notes (Signed)
Occupational Therapy Session Note  Patient Details  Name: Andre Wolfe MRN: [id number] Date of Birth: Nov 19, 1934  Today's Date: 07/22/2018 OT Individual Time: 9147-8295 OT Individual Time Calculation (min): 43 min    Short Term Goals: Week 1:  OT Short Term Goal 1 (Week 1): STG = LTGs due to ELOS  Skilled Therapeutic Interventions/Progress Updates:    Treatment session with focus on ADL retraining.  Pt received upright in recliner reporting desire to bathe at shower level. Pt ambulated around room with RW to gather clothing prior to shower.  Close supervision during ambulation and CGA when stooping to retrieve items from dresser.  Therapist providing min assist as pt turned to approach shower backwards.  Pt completed bathing at sit > stand level with cues to sit when washing lower legs and when undressing/dressing.  Pt overall CGA during bathing this session.  Required min assist sit > stand from shower seat post bathing due to fatigue.  Pt ambulated with CGA back to recliner and completed dressing from sit > stand level.  Pt demonstrated difficulty when donning foot up brace, requiring cues for setup of ankle piece and how to insert buckle.  Applied whiteout to buckle and clasp to increase success with locating clasp to fasten brace.  Pt demonstrated increased success when fastening brace.  Engaged in oral care in standing with close supervision. Pt returned to bed and left semi-reclined with all needs in reach.  Therapy Documentation Precautions:  Precautions Precautions: Fall Restrictions Weight Bearing Restrictions: No Pain:  Pt with no c/o pain   Therapy/Group: Individual Therapy  [name] 07/22/2018, 12:28 PM

## 2018-07-23 ENCOUNTER — Inpatient Hospital Stay (HOSPITAL_COMMUNITY): Payer: Medicare Other | Admitting: Occupational Therapy

## 2018-07-23 ENCOUNTER — Inpatient Hospital Stay (HOSPITAL_COMMUNITY): Payer: Medicare Other | Admitting: Physical Therapy

## 2018-07-23 LAB — GLUCOSE, CAPILLARY
Glucose-Capillary: 116 mg/dL — ABNORMAL HIGH (ref 70–99)
Glucose-Capillary: 133 mg/dL — ABNORMAL HIGH (ref 70–99)
Glucose-Capillary: 73 mg/dL (ref 70–99)
Glucose-Capillary: 179 mg/dL — ABNORMAL HIGH (ref 70–99)

## 2018-07-23 MED ORDER — METOPROLOL TARTRATE 12.5 MG HALF TABLET
12.5000 mg | ORAL_TABLET | Freq: Two times a day (BID) | ORAL | Status: DC
  Administered 2018-07-23 – 2018-07-26 (×6): 12.5 mg via ORAL
  Filled 2018-07-23 (×6): qty 1

## 2018-07-23 NOTE — Progress Notes (Signed)
Occupational Therapy Session Note  Patient Details  Name: Andre Wolfe MRN: [id number] Date of Birth: Dec 04, 1934  Today's Date: 07/23/2018 OT Individual Time: 0601-5615 OT Individual Time Calculation (min): 44 min    Short Term Goals: Week 1:  OT Short Term Goal 1 (Week 1): STG = LTGs due to ELOS  Skilled Therapeutic Interventions/Progress Updates:    Treatment session with focus on activity tolerance, standing balance, and functional use of LUE.  Pt received supine in bed.  Pt completed bed mobility with CGA and donned shoes with increased success this session with improved sitting balance.  Pt ambulated to [employer] with RW and CGA.  Engaged in trials with focus on increased use of LUE.  Pt reaction times Rt: 1.60 and Lt: 2.26 and 2.11seconds.  Pt continues to demonstrate increased difficulty with reaching >90* as he fatigues, therefore requiring increased time and effort.  Pt required seated rest breaks between trials.  Ambulated back to room with RW with CGA.  Educated on functional use of LUE as pt reports feeling that he has good strength, but is also receptive when therapist notes weaknesses during functional and structured reaching tasks.  Pt remained semi-reclined in bed with all needs in reach.  Therapy Documentation Precautions:  Precautions Precautions: Fall Restrictions Weight Bearing Restrictions: No General:   Vital Signs: Therapy Vitals Temp: 99.1 F (37.3 C) Temp Source: Oral Pulse Rate: (!) 50 Resp: 16 BP: 124/60 Patient Position (if appropriate): Lying Oxygen Therapy SpO2: 100 % O2 Device: Room Air Pain: Pain Assessment Pain Scale: 0-10 Pain Score: 1  Pain Type: Acute pain Pain Location: Head Pain Orientation: Anterior Pain Descriptors / Indicators: Aching Pain Onset: On-going Patients Stated Pain Goal: 0 Pain Intervention(s): Ambulation/increased activity;Distraction Multiple Pain Sites: No   Therapy/Group: Individual Therapy  [name] 07/23/2018, 3:54 PM

## 2018-07-23 NOTE — Progress Notes (Signed)
Physical Therapy Discharge Summary  Patient Details  Name: Andre Wolfe MRN: [id number] Date of Birth: 12/17/1934  Today's Date: 07/25/2018 PT Individual Time: 8676-7209 PT Individual Time Calculation (min): 40 min   Pt in supine and agreeable to therapy, denies pain. Session focused on functional mobility and endurance. Ambulated around unit w/ supervision using RW and L foot-up brace, >150' at a time. Occasional seated rest breaks 2/2 fatigue - increased L foot drag w/ fatigue. Educated pt on taking breaks because of this, though he may not feel he needs to take a break. Pt verbalized understanding. Performed bed mobility, transfers, stair negotiation, and car transfer w/ supervision level assist. Occasional verbal cues for technique and safety w/ RW management. NuStep 8 min @ level 4 for global endurance and LE strengthening. Returned to room and ended session in recliner, all needs in reach.   Patient has met 7 of 8 long term goals due to improved activity tolerance, improved balance, improved postural control, increased strength, ability to compensate for deficits, functional use of  left upper extremity and left lower extremity and improved coordination.  Patient to discharge at an ambulatory level supervision with RW & L foot-up brace.     Patient's care partner is independent to provide the necessary physical assistance at discharge. Pt discharging home in care of his wife. Wife was present to observe therapies earlier in week and has been educated on safety recommendations, however has not been present to participate in any hands-on training.   Reasons goals not met: pt did not meet bed mobility 2/2 impaired strength & awareness  Recommendation:  Patient will benefit from ongoing skilled PT services in home health setting to continue to advance safe functional mobility, address ongoing impairments in impaired awareness, LUE/LLE neuromuscular re-education, BLE & global strengthening,  endurance training, gait, stair negotiation, safety awareness, and minimize fall risk.  Equipment: RW, L foot-up brace  Reasons for discharge: treatment goals met  Patient/family agrees with progress made and goals achieved: Yes  PT Discharge Precautions/Restrictions Precautions Precautions: Fall Restrictions Weight Bearing Restrictions: No  Vision/Perception  Wears glasses for reading only at baseline. No change in baseline vision.  Slight decreased attention to L.  Cognition Overall Cognitive Status: Impaired/Different from baseline Arousal/Alertness: Awake/alert Awareness: Impaired Awareness Impairment: Emergent impairment;Anticipatory impairment Safety/Judgment: Impaired  Sensation Sensation Light Touch: Impaired Detail Central sensation comments: Intact Peripheral sensation comments: Diabetic neuropathy at baseline, numbness/tingling on plantar surfaces of bilateral feet Coordination Gross Motor Movements are Fluid and Coordinated: No Heel Shin Test: L ataxic and weaker than R   Motor  Motor Motor: Abnormal postural alignment and control Motor - Discharge Observations: mild L hemi, LLE ataxia, generalized deconditioning   Mobility Bed Mobility Bed Mobility: Rolling Right;Sit to Supine;Rolling Left;Supine to Sit Rolling Right: Supervision/verbal cueing Rolling Left: Supervision/Verbal cueing Supine to Sit: Supervision/Verbal cueing Sit to Supine: Supervision/Verbal cueing Transfers Transfers: Sit to Stand;Stand to Sit Sit to Stand: Supervision/Verbal cueing - cuing for anterior weight shifting, pt relies heavily on BUE to push to standing Stand to Sit: Supervision/Verbal cueing(very poor eccentric control & safety awareness)   Locomotion  Gait Ambulation: Yes Gait Assistance: Supervision/Verbal cueing Assistive device: Rolling walker(L foot-up brace) Gait Assistance Details: Verbal cues for technique Gait Gait: Yes Gait Pattern: Impaired Gait Pattern:  (mild ataxia LLE, decreased foot clearance & heel strike LLE, decreased foot clearance LLE (L foot up brace helps to correct), decreased weight shifting R) Stairs / Additional Locomotion Stairs: Yes Stairs Assistance: Supervision/Verbal cueing Stair  Management Technique: Two rails Height of Stairs: (6" + 3")   Trunk/Postural Assessment  Postural Control Postural Control: Deficits on evaluation Righting Reactions: delayed Protective Responses: delayed   Balance Balance Balance Assessed: Yes Static Sitting Balance Static Sitting - Level of Assistance: 5: Stand by assistance Dynamic Sitting Balance Dynamic Sitting - Level of Assistance: 5: Stand by assistance Static Standing Balance Static Standing - Level of Assistance: 5: Stand by assistance Dynamic Standing Balance Dynamic Standing - Level of Assistance: 5: Stand by assistance   Berg Balance Test on 07/21/18 = 24/56  Extremity Assessment  RLE Assessment RLE Assessment: Within Functional Limits LLE Assessment LLE Assessment: Exceptions to [facility] Passive Range of Motion (PROM) Comments: Increased L hamstring tightness, able to reach full range w/ stretching General Strength Comments: 4/5 globally   Lavone Nian, PT, DPT 07/24/2018, 12:31 PM  Breven Guidroz Clent Demark 07/25/2018, 9:31 AM

## 2018-07-23 NOTE — Progress Notes (Signed)
Occupational Therapy Session Note  Patient Details  Name: Andre Wolfe MRN: [id number] Date of Birth: Jul 17, 1934  Today's Date: 07/23/2018 OT Individual Time: 0900-1000 OT Individual Time Calculation (min): 60 min    Short Term Goals: Week 1:  OT Short Term Goal 1 (Week 1): STG = LTGs due to ELOS  Skilled Therapeutic Interventions/Progress Updates:    Treatment session with focus on functional transfers, dynamic standing balance, and functional use of LUE.  Pt received supine in bed declining bathing/dressing.  Pt completed bed mobility and donned shoes seated at EOB with CGA for sitting balance.  Pt required increased time tying Lt shoe in figure 4 position, due to decreased fine motor control in LUE and sitting balance as he fatigued. Pt ambulated to toilet with RW and CGA. Pt completed toileting tasks in standing with supervision (unable to void).  Returned to sink and completed oral care and hand hygiene in standing with close supervision.  Pt ambulated 100' with RW with CGA.  Engaged in standing activity with focus on activity tolerance and functional use of LUE while completing pipe tree puzzle.  Pt demonstrates decreased grasp in LUE when removing pvc pipes and difficulty with repetitive reaching >90*.  Engaged in ball toss with focus on increased challenge of LUE ROM and focus on symmetrical movements.  Completed reaching task when reaching overhead with LUE and incorporating trunk rotation to retrieve items, to simulate trunk rotation as needed for self-care tasks and functional mobility.  Pt ambulated 100' with RW with close supervision and returned to room remainder of distance in w/c.  Pt left upright in w/c in handoff with PT.  Therapy Documentation Precautions:  Precautions Precautions: Fall Restrictions Weight Bearing Restrictions: No Pain: Pain Assessment Pain Scale: 0-10 Pain Score: 1  Pain Type: Acute pain Pain Location: Head Pain Orientation: Anterior Pain Descriptors /  Indicators: Aching Pain Onset: On-going Patients Stated Pain Goal: 0 Pain Intervention(s): Ambulation/increased activity;Distraction Multiple Pain Sites: No   Therapy/Group: Individual Therapy  [name] 07/23/2018, 12:20 PM

## 2018-07-23 NOTE — Progress Notes (Signed)
Occupational Therapy Session Note  Patient Details  Name: Andre Wolfe MRN: [id number] Date of Birth: 14-Nov-1934  Today's Date: 07/23/2018 OT Individual Time: 6282-4175 OT Individual Time Calculation (min): 27 min   Short Term Goals: Week 1:  OT Short Term Goal 1 (Week 1): STG = LTGs due to ELOS  Skilled Therapeutic Interventions/Progress Updates:    Pt greeted semi-reclined in bed and agreeable to OT treatment session. Stand-pivots throughout session with CGA. B UE strength/coordination with wc propulsion to day room. Pt completed 12 mins on Sci fit arm bike on level 3 for UB strengthening. Pt needed 3 verbal cues to maintain grip on handles. Pt propelled wc back to room with increased time and supervision. Pt left semi-reclined in bed at end of session with needs met.   Therapy Documentation Precautions:  Precautions Precautions: Fall Restrictions Weight Bearing Restrictions: No Pain: Pain Assessment Pain Scale: 0-10 Pain Score: 0  Therapy/Group: Individual Therapy  [name] 07/23/2018, 3:36 PM

## 2018-07-23 NOTE — Progress Notes (Signed)
Physical Therapy Session Note  Patient Details  Name: [name] MRN: [id number] Date of Birth: 12-29-1934  Today's Date: 07/23/2018 PT Individual Time: 1005-1100 PT Individual Time Calculation (min): 55 min   Short Term Goals: Week 1:  PT Short Term Goal 1 (Week 1): STG=LTG due to ELOS   Skilled Therapeutic Interventions/Progress Updates:    Patient received up in [facility], pleasant and willing to work with PT this morning. Used B LEs and UEs to self propel WC multiple distances of approximately 127f, then able to transfer to Nustep with min guard and RW, Mod cues for safety due to tendency for incorrect hand placement and falling into seat rather than controlling descent. Rode Nustep 5 minutes x2 with B UEs/LEs on resistance 4 for 10 minutes, [facility], following this continued gait training with min guard and RW 121fand 9866fMin increasing to Mod cues for improved toe clearance/ankle DF without brace(s) during swing phase, note foot/toe drag does increase as fatigue increases. Otherwise transferred to mat table with min guard and RW, worked on resisted L ankle dorsiflexion with orange band 2x10 and bridges 2x15, also staggered sit to stands with L LE back/R LE forward for increased activation L LE musculature. Then transferred back to WC min guard with RW, was transported back to his [facility], and left up in chair with seat belt alarm active, all needs otherwise met.   Therapy Documentation Precautions:  Precautions Precautions: Fall Restrictions Weight Bearing Restrictions: No General:   Vital Signs:  Pain: Pain Assessment Pain Scale: 0-10 Pain Score: 1  Pain Type: Acute pain Pain Location: Head Pain Orientation: Anterior Pain Descriptors / Indicators: Aching Pain Onset: On-going Patients Stated Pain Goal: 0 Pain Intervention(s): Ambulation/increased activity;Distraction Multiple Pain Sites:  No    Therapy/Group: Individual Therapy  KriDeniece Ree, DPT, CBIS  Supplemental Physical Therapist [facility] Pager [phone] Rehab Office [phone]/03/2019, 12:16 PM

## 2018-07-23 NOTE — Plan of Care (Signed)
  Problem: RH BLADDER ELIMINATION Goal: RH STG MANAGE BLADDER WITH ASSISTANCE Description STG Manage Bladder With min Assistance  Outcome: Progressing Goal: RH STG MANAGE BLADDER WITH EQUIPMENT WITH ASSISTANCE Description STG Manage Bladder With Equipment With min Assistance  Outcome: Progressing   Problem: RH SKIN INTEGRITY Goal: RH STG SKIN FREE OF INFECTION/BREAKDOWN Description Skin free of infection/breakdown with min assist  Outcome: Progressing   Problem: RH SAFETY Goal: RH STG ADHERE TO SAFETY PRECAUTIONS W/ASSISTANCE/DEVICE Description STG Adhere to Safety Precautions With min Assistance/Device.  Outcome: Progressing

## 2018-07-23 NOTE — Progress Notes (Signed)
[place] PHYSICAL MEDICINE & REHABILITATION PROGRESS NOTE   Subjective/Complaints:  Occ dizziness, Left ankle weakness with prolonged walking  Review of systems denies chest pain shortness of breath nausea vomiting diarrhea or constipation Objective:   General: No acute distress Mood and affect are appropriate Heart: Regular rate and rhythm no rubs murmurs or extra sounds Lungs: Clear to auscultation, breathing unlabored, no rales or wheezes Abdomen: Positive bowel sounds, soft nontender to palpation, nondistended Extremities: No clubbing, cyanosis, or edema Skin: No evidence of breakdown, no evidence of rash Neurologic: Cranial nerves II through XII intact, motor strength is 5/5 in bilateral deltoid, bicep, tricep, grip, hip flexor, knee extensors, ankle dorsiflexor and plantar flexor Sensory exam normal sensation to light touch and proprioception in bilateral upper and lower extremities Cerebellar exam normal finger to nose to finger as well as heel to shin in bilateral upper and lower extremities Positive dysdiadochokinesis with rapid alternating supination pronation of the left upper extremity. Musculoskeletal: Full range of motion in all 4 extremities. No joint swelling No results found. No results for input(s): WBC, HGB, HCT, PLT in the last 72 hours. Recent Labs    07/21/18 0606  NA 138  K 4.0  CL 106  CO2 26  GLUCOSE 114*  BUN 14  CREATININE 1.21  CALCIUM 9.1    Intake/Output Summary (Last 24 hours) at 07/23/2018 0733 Last data filed at 07/23/2018 6606 Gross per 24 hour  Intake 720 ml  Output 620 ml  Net 100 ml     Physical Exam: Vital Signs Blood pressure 123/60, pulse (!) 50, temperature 97.9 F (36.6 C), temperature source Oral, resp. rate 18, height 6' (1.829 m), weight 93.3 kg, SpO2 97 %.    General: No acute distress Mood and affect are appropriate Heart: bradycardia irregular ,no rubs murmurs or extra sounds Lungs: Clear to auscultation, breathing  unlabored, no rales or wheezes Abdomen: Positive bowel sounds, soft nontender to palpation, nondistended Extremities: No clubbing, cyanosis, or edema Skin: No evidence of breakdown, no evidence of rash Neurologic: Cranial nerves II through XII intact, motor strength is 5/5 in bilateral deltoid, bicep, tricep, grip, hip flexor, knee extensors, ankle dorsiflexor and plantar flexor Sensory exam normal sensation to light touch and proprioception in bilateral upper and lower extremities Cerebellar exam normal finger to nose to finger as well as heel to shin in bilateral upper and lower extremities Musculoskeletal: Full range of motion in all 4 extremities. No joint swelling  Assessment/Plan: 1. 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  Physiatrist is providing close team supervision and 24 hour management of active medical problems listed below.  Physiatrist and rehab team continue to assess barriers to discharge/monitor patient progress toward functional and medical goals  Care Tool:  Bathing    Body parts bathed by patient: Right arm, Left arm, Chest, Abdomen, Front perineal area, Buttocks, Right upper leg, Left upper leg, Right lower leg, Left lower leg, Face         Bathing assist Assist Level: Contact Guard/Touching assist     Upper Body Dressing/Undressing Upper body dressing   What is the patient wearing?: Pull over shirt    Upper body assist Assist Level: Supervision/Verbal cueing    Lower Body Dressing/Undressing Lower body dressing      What is the patient wearing?: Underwear/pull up, Pants     Lower body assist Assist for lower body  dressing: Contact Guard/Touching assist     Toileting Toileting    Toileting assist Assist for toileting: Minimal Assistance - Patient > 75%     Transfers Chair/bed  transfer  Transfers assist     Chair/bed transfer assist level: Contact Guard/Touching assist     Locomotion Ambulation   Ambulation assist      Assist level: Contact Guard/Touching assist Assistive device: Walker-rolling(L foot up) Max distance: 100 ft   Walk 10 feet activity   Assist     Assist level: Contact Guard/Touching assist Assistive device: Walker-rolling(L foot up)   Walk 50 feet activity   Assist    Assist level: Minimal Assistance - Patient > 75% Assistive device: Walker-rolling(L foot up)    Walk 150 feet activity   Assist Walk 150 feet activity did not occur: Safety/medical concerns  Assist level: Minimal Assistance - Patient > 75% Assistive device: Walker-rolling    Walk 10 feet on uneven surface  activity   Assist     Assist level: Moderate Assistance - Patient - 50 - 74% Assistive device: Aeronautical engineer   Type of Wheelchair: Manual    Wheelchair assist level: Minimal Assistance - Patient > 75% Max wheelchair distance: 138ft    Wheelchair 50 feet with 2 turns activity    Assist        Assist Level: Minimal Assistance - Patient > 75%   Wheelchair 150 feet activity     Assist Wheelchair 150 feet activity did not occur: Safety/medical concerns        Medical Problem List and Plan: 1.Functional deficits and left hemiparesissecondary to right pontine (as well as small left parietal) embolic infarcts -CIR PT OT, per PT rec AFO 2. DVT Prophylaxis/Anticoagulation: Pharmaceutical:Other (comment)eliquis 3. Pain Management:tylenol 4. Mood:team to provide ego support. Pt appears motivated and positive on exam today 5. Neuropsych: This patientiscapable of making decisions on hisown behalf. 6. Skin/Wound Care:encourage appropriate nutrition. Local care as needed 7. Fluids/Electrolytes/Nutrition:encourage PO, follow up labs on admit normal basic metabolic package  02/12/8562        8. A fib with RVR: eliquis for stroke proph -lopressor 50mg  bid for rate control -outpt follow up with cardiology Vitals:   07/22/18 2125 07/23/18 0647  BP: (!) 141/77 123/60  Pulse: (!) 55 (!) 50  Resp: 18 18  Temp: 97.6 F (36.4 C) 97.9 F (36.6 C)  SpO2: 98% 97%  BP in desired range- HR on low side will reduce lopressor to 12.5mg  BID, restarted amlodipine 2.5mg  daily 07/23/18 9. CAD with history of CABG: -eliquis -beta blocker, repatha  #10.  Diabetes on 70/30 insulin per endocrinology.  Overall control is good but some lability.  Will hold off on noontime 70/30 dose today, morning dose was given at 10:30 AM and has not peaked yet CBG (last 3)  Recent Labs    07/22/18 1657 07/22/18 2139 07/23/18 0642  GLUCAP 86 140* 116*  Well controlled 07/23/2018- cont current meds 70/30 25U qam and q pm, 5U qnoon  LOS: 7 days A FACE TO [place] E Kailin Leu 07/23/2018, 7:33 AM

## 2018-07-24 ENCOUNTER — Inpatient Hospital Stay (HOSPITAL_COMMUNITY): Payer: Medicare Other | Admitting: Occupational Therapy

## 2018-07-24 ENCOUNTER — Ambulatory Visit: Payer: Medicare Other | Admitting: Physician Assistant

## 2018-07-24 ENCOUNTER — Inpatient Hospital Stay (HOSPITAL_COMMUNITY): Payer: Medicare Other | Admitting: Physical Therapy

## 2018-07-24 LAB — GLUCOSE, CAPILLARY
Glucose-Capillary: 152 mg/dL — ABNORMAL HIGH (ref 70–99)
Glucose-Capillary: 151 mg/dL — ABNORMAL HIGH (ref 70–99)
Glucose-Capillary: 118 mg/dL — ABNORMAL HIGH (ref 70–99)
Glucose-Capillary: 156 mg/dL — ABNORMAL HIGH (ref 70–99)

## 2018-07-24 MED ORDER — APIXABAN 5 MG PO TABS: 5.0000 mg | ORAL_TABLET | Freq: Two times a day (BID) | ORAL | 0 refills | Status: DC

## 2018-07-24 MED ORDER — AMLODIPINE BESYLATE 2.5 MG PO TABS: 2.5000 mg | ORAL_TABLET | Freq: Every day | ORAL | 0 refills | Status: DC

## 2018-07-24 MED ORDER — METOPROLOL TARTRATE 25 MG PO TABS: 12.5000 mg | ORAL_TABLET | Freq: Two times a day (BID) | ORAL | 0 refills | Status: DC

## 2018-07-24 MED ORDER — PANTOPRAZOLE SODIUM 40 MG PO TBEC: 40.0000 mg | DELAYED_RELEASE_TABLET | Freq: Every day | ORAL | 0 refills | Status: DC

## 2018-07-24 MED ORDER — TAMSULOSIN HCL 0.4 MG PO CAPS: 0.4000 mg | ORAL_CAPSULE | Freq: Every day | ORAL | 0 refills | Status: DC

## 2018-07-24 NOTE — Progress Notes (Deleted)
Patient discharge home with family. Discharge instructions given to family and PA during shift. No further questions noted from family or patient.

## 2018-07-24 NOTE — Plan of Care (Signed)
  Problem: Consults Goal: RH STROKE PATIENT EDUCATION Description See Patient Education module for education specifics  Outcome: Completed/Met   Problem: RH BOWEL ELIMINATION Goal: RH STG MANAGE BOWEL W/MEDICATION W/ASSISTANCE Description STG Manage Bowel with Medication with min Assistance.  Outcome: Completed/Met   Problem: RH BLADDER ELIMINATION Goal: RH STG MANAGE BLADDER WITH ASSISTANCE Description STG Manage Bladder With min Assistance  Outcome: Completed/Met Goal: RH STG MANAGE BLADDER WITH EQUIPMENT WITH ASSISTANCE Description STG Manage Bladder With Equipment With min Assistance  Outcome: Completed/Met   Problem: RH SKIN INTEGRITY Goal: RH STG SKIN FREE OF INFECTION/BREAKDOWN Description Skin free of infection/breakdown with min assist  Outcome: Completed/Met   Problem: RH SAFETY Goal: RH STG ADHERE TO SAFETY PRECAUTIONS W/ASSISTANCE/DEVICE Description STG Adhere to Safety Precautions With min Assistance/Device.  Outcome: Completed/Met   Problem: RH KNOWLEDGE DEFICIT Goal: RH STG INCREASE KNOWLEDGE OF HYPERTENSION Outcome: Completed/Met Goal: RH STG INCREASE KNOWLEGDE OF HYPERLIPIDEMIA Outcome: Completed/Met Goal: RH STG INCREASE KNOWLEDGE OF STROKE PROPHYLAXIS Outcome: Completed/Met

## 2018-07-24 NOTE — Progress Notes (Signed)
[place] PHYSICAL MEDICINE & REHABILITATION PROGRESS NOTE   Subjective/Complaints:  No issues overntie  Review of systems denies chest pain shortness of breath nausea vomiting diarrhea or constipation Objective:   General: No acute distress Mood and affect are appropriate Heart: Regular rate and rhythm no rubs murmurs or extra sounds Lungs: Clear to auscultation, breathing unlabored, no rales or wheezes Abdomen: Positive bowel sounds, soft nontender to palpation, nondistended Extremities: No clubbing, cyanosis, or edema Skin: No evidence of breakdown, no evidence of rash Neurologic: Cranial nerves II through XII intact, motor strength is 5/5 in bilateral deltoid, bicep, tricep, grip, hip flexor, knee extensors, ankle dorsiflexor and plantar flexor Sensory exam normal sensation to light touch and proprioception in bilateral upper and lower extremities  Positive dysdiadochokinesis with rapid alternating supination pronation of the left upper extremity. Musculoskeletal: Full range of motion in all 4 extremities. No joint swelling No results found. No results for input(s): WBC, HGB, HCT, PLT in the last 72 hours. No results for input(s): NA, K, CL, CO2, GLUCOSE, BUN, CREATININE, CALCIUM in the last 72 hours.  Intake/Output Summary (Last 24 hours) at 07/24/2018 0721 Last data filed at 07/24/2018 0630 Gross per 24 hour  Intake 240 ml  Output 1430 ml  Net -1190 ml     Physical Exam: Vital Signs Blood pressure (!) 148/64, pulse (!) 56, temperature 98 F (36.7 C), resp. rate 17, height 6' (1.829 m), weight 93.3 kg, SpO2 100 %.    General: No acute distress Mood and affect are appropriate Heart: bradycardia irregular ,no rubs murmurs or extra sounds Lungs: Clear to auscultation, breathing unlabored, no rales or wheezes Abdomen: Positive bowel sounds, soft nontender to palpation, nondistended Extremities: No clubbing, cyanosis, or edema Skin: No evidence of breakdown, no evidence  of rash Neurologic: Cranial nerves II through XII intact, motor strength is 5/5 in bilateral deltoid, bicep, tricep, grip, hip flexor, knee extensors, ankle dorsiflexor and plantar flexor Sensory exam normal sensation to light touch and proprioception in bilateral upper and lower extremities Cerebellar exam normal finger to nose to finger as well as heel to shin in bilateral upper and lower extremities Musculoskeletal: Full range of motion in all 4 extremities. No joint swelling  Assessment/Plan: 1. 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  Physiatrist is providing close team supervision and 24 hour management of active medical problems listed below.  Physiatrist and rehab team continue to assess barriers to discharge/monitor patient progress toward functional and medical goals  Care Tool:  Bathing    Body parts bathed by patient: Right arm, Left arm, Chest, Abdomen, Front perineal area, Buttocks, Right upper leg, Left upper leg, Right lower leg, Left lower leg, Face         Bathing assist Assist Level: Contact Guard/Touching assist     Upper Body Dressing/Undressing Upper body dressing   What is the patient wearing?: Pull over shirt    Upper body assist Assist Level: Supervision/Verbal cueing    Lower Body Dressing/Undressing Lower body dressing      What is the patient wearing?: Underwear/pull up, Pants     Lower body assist Assist for lower body dressing: Contact Guard/Touching assist     Toileting Toileting    Toileting assist Assist for toileting: Supervision/Verbal cueing     Transfers Chair/bed transfer  Transfers assist     Chair/bed transfer assist level: Contact Guard/Touching assist  Locomotion Ambulation   Ambulation assist      Assist level: Contact Guard/Touching assist Assistive device:  Walker-rolling Max distance: 123ft   Walk 10 feet activity   Assist     Assist level: Contact Guard/Touching assist Assistive device: Walker-rolling   Walk 50 feet activity   Assist    Assist level: Contact Guard/Touching assist Assistive device: Walker-rolling    Walk 150 feet activity   Assist Walk 150 feet activity did not occur: Safety/medical concerns  Assist level: Minimal Assistance - Patient > 75% Assistive device: Walker-rolling    Walk 10 feet on uneven surface  activity   Assist     Assist level: Moderate Assistance - Patient - 50 - 74% Assistive device: Aeronautical engineer   Type of Wheelchair: Agricultural engineer assist level: Supervision/Verbal cueing Max wheelchair distance: 139ft    Wheelchair 50 feet with 2 turns activity    Assist        Assist Level: Supervision/Verbal cueing   Wheelchair 150 feet activity     Assist Wheelchair 150 feet activity did not occur: Safety/medical concerns        Medical Problem List and Plan: 1.Functional deficits and left hemiparesissecondary to right pontine (as well as small left parietal) embolic infarcts -CIR PT OT,  Plan D/C Sunday 1/10 2. DVT Prophylaxis/Anticoagulation: Pharmaceutical:Other (comment)eliquis 3. Pain Management:tylenol 4. Mood:team to provide ego support. Pt appears motivated and positive on exam today 5. Neuropsych: This patientiscapable of making decisions on hisown behalf. 6. Skin/Wound Care:encourage appropriate nutrition. Local care as needed 7. Fluids/Electrolytes/Nutrition:encourage PO, follow up labs on admit normal basic metabolic package 5/0/0938        8. A fib with RVR: eliquis for stroke proph -lopressor 50mg  bid for rate control -outpt follow up with cardiology Vitals:   07/23/18 1413 07/23/18 1926  BP: 124/60 (!) 148/64  Pulse: (!) 50 (!) 56  Resp: 16 17  Temp: 99.1 F  (37.3 C) 98 F (36.7 C)  SpO2: 100% 100%  BP in desired range- HR on low side will reduce lopressor to 12.5mg  BID, restarted amlodipine 2.5mg  daily 07/23/18 9. CAD with history of CABG: -eliquis -beta blocker, repatha  #10.  Diabetes on 70/30 insulin per endocrinology.  Overall control is good but some lability.  Will hold off on noontime 70/30 dose today, morning dose was given at 10:30 AM and has not peaked yet CBG (last 3)  Recent Labs    07/23/18 1643 07/23/18 2149 07/24/18 0710  GLUCAP 73 179* 152*  Well controlled 07/24/2018 - cont current meds 70/30 25U qam and q pm, 5U qnoon  LOS: 8 days A FACE TO FACE EVALUATION WAS PERFORMED  Charlett Blake 07/24/2018, 7:21 AM

## 2018-07-24 NOTE — Progress Notes (Addendum)
Physical Therapy Session Note  Patient Details  Name: Andre Wolfe MRN: [id number] Date of Birth: 11-20-1934  Today's Date: 07/24/2018 PT Individual Time: [phone] and 7209-4709 PT Individual Time Calculation (min): 56 min and 56 min  Short Term Goals: Week 1:  PT Short Term Goal 1 (Week 1): STG=LTG due to ELOS   Skilled Therapeutic Interventions/Progress Updates:  Treatment 1: Pt received in bed & agreeable to tx. Pt transfers to EOB with supervision and dons B shoes, L foot-up brace with assistance. Pt reports "I doubt I wear this at home" but also reports it assists with lifting toes during gait - therapist educated him multiple times during session on benefits and need to wear brace at home. Gait training >150 ft with RW & L foot-up brace with close supervision with cuing for increased awareness of LLE placement during turns and occasional cuing to ambulate inside base of AD. Pt completes bed mobility in apartment with supervision & extra time, unable to transition R sidelying>sitting 2/2 LOB off EOB with therapist providing min assist & pt able to correct but pt able to transfer supine>sitting with supervision and pulling on EOB. Pt reports his bed is taller than one in the apartment and he uses a step to access his bed. Educated pt to remove box spring or sleep in another shorter bed that he reports he has at home with pt verbalizing understanding. Pt negotiates 12 steps (6" + 3") with B rails and close supervision with cuing for compensatory pattern and pt still with slight decreased LLE foot clearance. Pt stood on wedge for B heel cord stretch while engaging in reaching L with LUE for functional, forced use and close supervision for balance. Pt reports need to use restroom and is returned to room via w/c dependent assist for time management. Pt ambulates into bathroom with RW & CGA but pt already incontinent of bowels. Pt reports urgency and incontinence prior to admission & therapist educated  him on ability to wear disposable briefs. Pt left sitting on toilet in handoff to RN. Pt with impaired memory unable to recall therapist's names nor what he has been working on with OT yesterday.   Pain: pt reports "I'm taking a headache" but rates it "a half" & does not want to request pain medicine.   Pt reports he has a sedan to ride home in unless they bring his daughter's [facility], which he had difficulty getting in/out of prior to admission. Educated pt on need to ride home in his personal sedan with pt voicing understanding.   Pt requires min assist for sit>stand from low, compliant couch 2/2 posterior lean and decreased BLE strength.   Treatment 2: Pt received in bed & agreeable to tx. Pt transfers supine>sitting EOB with supervision and extra time with cuing to use bed features as pt attempting to reach for therapist. Pt transfers to w/c with supervision and dons shoes & L foot-up brace with min assist for proper orientation of ankle portion of foot-up brace and to clip two pieces together (pt requires cuing to don shoes & L foot-up for increased safety as he initially states "I wasn't going to put them on"). Transported pt to gym via w/c dependent assist for time management. Pt transferred w/c<>mat table with supervision, use of hands, and continued poor eccentric control. Pt transfers supine<>sit with supervision and while supine on mat table pt performs BLE bridging with adductor hold and LLE single leg bridging with cuing for technique and task focusing  on LLE strengthening & NMR. Pt transferred to tall kneeling on EOM with CGA<>min assist and intermittent BUE support to maintain position with cuing for increased hip extension and glute activation. While in tall kneeling pt engaged in simple pipe tree assembly from choice of many with max cuing for error correction & problem solving. Pt required +2 assist to return to w/c from EOM 2/2 fatigue & sitting on edge of w/c 2/2 impaired awareness. Pt  ambulates back to room with RW, L foot-up brace, and supervision. At end of session pt left sitting in recliner with chair alarm donned & all needs in reach.  Pt with impaired recall as he is initally unable to recall therapist's name despite education on it this morning.     Therapy Documentation Precautions:  Precautions Precautions: Fall Restrictions Weight Bearing Restrictions: No    Therapy/Group: Individual Therapy  [name] 07/24/2018, 3:19 PM

## 2018-07-24 NOTE — Progress Notes (Signed)
Patient given handout on intermittent catheterization. Patient verbalized fear of puncturiing penis. Procedure on clean technique of intermittent catheterization verbally given and reinforced to patient. Questions entertained and answered. Emotional support given. RN demonstrated in and out cath while patient was observing.

## 2018-07-24 NOTE — Discharge Summary (Signed)
Physician Discharge Summary  Patient ID: Andre Wolfe MRN: [id number] DOB/AGE: Nov 21, 1934 84 y.o.  Admit date: 07/16/2018 Discharge date: 07/26/2018  Discharge Diagnoses:  Principal Problem:   Right pontine stroke [facility]) Active Problems:   Essential hypertension   Poorly controlled type 2 diabetes mellitus with peripheral neuropathy (HCC)   PAF (paroxysmal atrial fibrillation) (HCC)   Bradycardia   Discharged Condition: Stable   Significant Diagnostic Studies: N/A   Labs:  Basic Metabolic Panel: BMP Latest Ref Rng & Units 07/21/2018 07/17/2018 07/16/2018  Glucose 70 - 99 mg/dL 114(H) 130(H) 111(H)  BUN 8 - 23 mg/dL 14 13 13   Creatinine 0.61 - 1.24 mg/dL 1.21 1.02 1.04  Sodium 135 - 145 mmol/L 138 136 138  Potassium 3.5 - 5.1 mmol/L 4.0 4.7 4.2  Chloride 98 - 111 mmol/L 106 106 107  CO2 22 - 32 mmol/L 26 18(L) 22  Calcium 8.9 - 10.3 mg/dL 9.1 8.8(L) 8.9    CBC: CBC Latest Ref Rng & Units 07/17/2018 07/16/2018 07/15/2018  WBC 4.0 - 10.5 K/uL 7.2 7.1 6.6  Hemoglobin 13.0 - 17.0 g/dL 15.3 15.5 16.1  Hematocrit 39.0 - 52.0 % 44.4 43.6 46.0  Platelets 150 - 400 K/uL 192 190 211    CBG: No results for input(s): GLUCAP in the last 168 hours.  Brief HPI:   Andre Wolfe is an 84 year old male with history of HTN, CAD, T2DM, peripheral neuropathy, TIAs, PAF who was admitted on 07/13/18 with reports of unsteady gait, increased falls and slurred speech that started about 5 days PTA.  MRI/MRA brain done revealing right paramedian pontine acute/early subacute infarct with punctate infarct in left parietal cortex. He was noted to develop A fib after admission and was started on IV heparin and transitioned to Eliquis per cardiology input. Therapy evaluation done revealing left sided weakness with mild dysarthria, poor safety awareness and balance deficits affecting mobility and ability to carry out ADLs. CIR recommended due to functional decline.   Hospital Course: Andre Wolfe was admitted to rehab  07/16/2018 for inpatient therapies to consist of PT and OT at least three hours five days a week. Past admission physiatrist, therapy team and rehab RN have worked together to provide customized collaborative inpatient rehab. Blood pressures has been monitored on bid basis and due to continued  issues with  bradycardia lopressor was decreased to 12.5 mg bid. Norvasc was resumed for better BP control and cardiology was consulted for input as EKG revealed asymptomatic bradycardia with HR of 47. Dr. Stanford Breed recommended continuing decreased dose lopressor and no further intervention needed.   Diabetes has been monitored with ac/hs CBG checks and BS have been stable. Po intake has been good and he is continent of bowel and bladder. He reported occasional difficulty with voiding and PVR checks revealed intermittent problems with retention. He has required I/O catheterization once daily in early am and is able to void rest of the day with retention. He has been educated on self caths and is to follow up with urology after discharge. He was maintained on Eliquis and serial CBC shows H/H to be stable. Routine check of BMET showed that lytes and that renal status to be stable. He has made steady progress during his rehab stay and is at supervision level. He will continue to receive follow up HHPT and [facility] after discharge.    Rehab course: During patient's stay in rehab  team conference was held to monitor patient's progress, set  goals and discuss barriers to discharge. At admission, patient required min assist with mobility and basic self care tasks. He has had improvement in activity tolerance, balance, postural control as well as ability to compensate for deficits. He has had improvement in functional use LUE  and LLE as well as improvement in awareness. He requires supervision with cues for UB dressing and min assist for shower transfers.  He requires supervision for transfers and to ambulate 150' X  3 with RW and left foot up brace. Family education was completed regarding all aspects of care and safety     Disposition: Home  Diet: Heart Healthy/Diabetic.   Special Instructions: 1.  Continue to monitor BS 2-3 times per day. 2.  Cath once daily in am. Follow up with local urology for input.    Discharge Instructions    Ambulatory referral to Physical Medicine Rehab   Complete by:  As directed      Allergies as of 07/26/2018   No Known Allergies     Medication List    STOP taking these medications   [facility] OMEPRAZOLE PO   losartan 100 MG tablet Commonly known as:  COZAAR     TAKE these medications   acetaminophen 325 MG tablet Commonly known as:  TYLENOL Take 1-2 tablets (325-650 mg total) by mouth every 4 (four) hours as needed for mild pain.   amLODipine 2.5 MG tablet Commonly known as:  NORVASC Take 1 tablet (2.5 mg total) by mouth daily.   apixaban 5 MG Tabs tablet Commonly known as:  ELIQUIS Take 1 tablet (5 mg total) by mouth 2 (two) times daily.   Evolocumab 140 MG/ML Soaj Commonly known as:  REPATHA SURECLICK Inject 149 mg into the skin every 14 (fourteen) days.   gabapentin 600 MG tablet Commonly known as:  NEURONTIN Take 600 mg by mouth at bedtime.   metoprolol tartrate 25 MG tablet Commonly known as:  LOPRESSOR Take 0.5 tablets (12.5 mg total) by mouth 2 (two) times daily. What changed:    medication strength  how much to take   NOVOLIN 70/30 FLEXPEN (70-30) 100 UNIT/ML PEN Generic drug:  Insulin Isophane & Regular Human Inject 5-25 Units into the skin 3 (three) times daily. Taking 25 units in the Am and 5 units Midday, and 25 units before dinner.   pantoprazole 40 MG tablet Commonly known as:  PROTONIX Take 1 tablet (40 mg total) by mouth daily.   senna-docusate 8.6-50 MG tablet Commonly known as:  Senokot-S Take 1 tablet by mouth at bedtime as needed for mild constipation.   tamsulosin 0.4 MG Caps capsule Commonly known as:   FLOMAX Take 1 capsule (0.4 mg total) by mouth daily after supper.      Follow-up Information    Kirsteins, Luanna Salk, MD Follow up.   Specialty:  Physical Medicine and Rehabilitation Why:  office will call you with follow up appointment Contact information: [place] [address] [phone]        [facility]. Call.   Specialty:  Neurology Why:  for follow up appointment Contact information: [address] [place]       [name], MD Follow up on 08/04/2018.   Specialty:  Family Medicine Why:  Appointment @ 10:15 AM Contact information: [place] [address] [phone]        Erlene Quan, PA-C Follow up on 07/30/2018.   Specialties:  Cardiology,  Radiology Why:  Appointment at 8:30 am Contact information: [address] [place] [address] [phone]           Signed: Bary Leriche 08/02/2018, 10:28 PM

## 2018-07-24 NOTE — Progress Notes (Signed)
Social Work  Discharge Note  The overall goal for the admission was met for: DC SUN 1/12  Discharge location: Yes-HOME WITH WIFE WHO CAN PROVIDE SUPERVISION LEVEL  Length of Stay: Yes-10 DAYS  Discharge activity level: Yes-SUPERVISION LEVEL  Home/community participation: Yes  Services provided included: MD, RD, PT, OT, RN, CM, Pharmacy and SW  Financial Services: Medicare and Private Insurance: TRICARE  Follow-up services arranged: Home Health: [facility]-PT & OT, DME: [place] and Patient/Family has no preference for HH/DME agencies  Comments (or additional information):WIFE [place]. AWARE OF HIS BALANCE ISSUES WHICH ARE PREMORBID AND WAS WORKING ON PRIOR TO ADMISSION.   Patient/Family verbalized understanding of follow-up arrangements: Yes  Individual responsible for coordination of the follow-up plan: WIFE  Confirmed correct DME delivered: Elease Hashimoto 07/24/2018    Elease Hashimoto

## 2018-07-24 NOTE — Progress Notes (Signed)
Occupational Therapy Session Note  Patient Details  Name: Andre Wolfe MRN: [id number] Date of Birth: 1935-06-13  Today's Date: 07/24/2018 OT Individual Time: 1015-1100 OT Individual Time Calculation (min): 45 min    Short Term Goals: Week 1:  OT Short Term Goal 1 (Week 1): STG = LTGs due to ELOS  Skilled Therapeutic Interventions/Progress Updates:    1) Treatment session with focus on ADL retraining.  Pt received upright in recliner agreeable to bathing at shower level. Pt ambulated around room with RW with close supervision, cues for safety with RW when reaching to gather clothing prior to shower.  Pt ambulated in to bathroom with Supervision with RW, min assist for shower transfer due to quick movements and decreased emergent awareness. Pt completed bathing at sit > stand level with close supervision.  Pt ambulated back to room with RW with supervision.  Min cues to sit when doffing pants, but able to recall recommendation to sit when threading underwear and pants. Pt completed grooming tasks in standing with supervision.  Returned to bed and left semi-reclined with all needs in reach.  2)  Treatment session with focus on functional mobility and endurance.  Pt received in recliner with no c/o pain.  Pt expressing desire to ambulate.  Ambulated 100' with RW with supervision, followed by 200' after seated rest break with supervision.  Close supervision as pt fatigued with cues for attend to LLE, as pt demonstrating increased Lt foot drag as he fatigued.  Pt demonstrated doffing and donning Lt shoe with foot up brace with min cues for problem solving.  Pt reports that wife located his shower seat and 3 in 1, he reports not pleased with recommendation to use but is agreeable.  Pt returned to bed and left semi-reclined with all needs in reach.    Therapy Documentation Precautions:  Precautions Precautions: Fall Restrictions Weight Bearing Restrictions: No Pain:  Pt with no c/o  pain  Therapy/Group: Individual Therapy  [name] 07/24/2018, 12:28 PM

## 2018-07-25 ENCOUNTER — Inpatient Hospital Stay (HOSPITAL_COMMUNITY): Payer: Medicare Other | Admitting: Occupational Therapy

## 2018-07-25 ENCOUNTER — Inpatient Hospital Stay (HOSPITAL_COMMUNITY): Payer: Medicare Other | Admitting: Physical Therapy

## 2018-07-25 DIAGNOSIS — E1142 Type 2 diabetes mellitus with diabetic polyneuropathy: Secondary | ICD-10-CM

## 2018-07-25 DIAGNOSIS — I480 Paroxysmal atrial fibrillation: Secondary | ICD-10-CM

## 2018-07-25 DIAGNOSIS — R7309 Other abnormal glucose: Secondary | ICD-10-CM

## 2018-07-25 DIAGNOSIS — E1165 Type 2 diabetes mellitus with hyperglycemia: Secondary | ICD-10-CM

## 2018-07-25 LAB — GLUCOSE, CAPILLARY
GLUCOSE-CAPILLARY: 111 mg/dL — AB (ref 70–99)
Glucose-Capillary: 128 mg/dL — ABNORMAL HIGH (ref 70–99)
Glucose-Capillary: 125 mg/dL — ABNORMAL HIGH (ref 70–99)
Glucose-Capillary: 67 mg/dL — ABNORMAL LOW (ref 70–99)
Glucose-Capillary: 170 mg/dL — ABNORMAL HIGH (ref 70–99)

## 2018-07-25 NOTE — Progress Notes (Signed)
Occupational Therapy Discharge Summary  Patient Details  Name: Andre Wolfe MRN: [id number] Date of Birth: 01-16-1935  Today's Date: 07/25/2018 OT Individual Time: 7517-0017 OT Individual Time Calculation (min): 92 min   Patient has met 9 of 12 long term goals due to improved activity tolerance, improved balance, postural control, ability to compensate for deficits and functional use of  LEFT upper extremity.  Patient to discharge at overall Supervision level.  Patient's spouse, Silva Bandy, has attended family education to provide the necessary assistance at discharge.    Pt requires supervision/cuing for UB dressing and toileting per most recent staff documentation. Pt also requires Min A for walk in shower transfers due to decreased safety awareness. Pts spouse has verbalized and demonstrated that she can provide this needed assist at home.   Recommendation:  Patient will benefit from ongoing skilled OT services in home health setting to continue to advance functional skills in the area of BADL and iADL.  Equipment: No equipment provided  Reasons for discharge: treatment goals met and discharge from hospital  Patient/family agrees with progress made and goals achieved: Yes  Skilled Therapeutic Intervention:  Pt greeted in bed with spouse present. Spouse Silva Bandy had multiple questions regarding d/c recommendations. Reviewed transfer technique using RW to walk in shower with 3 inch threshold (per home setup). Spouse had hands on practice with providing close supervision-steadying assist during transfer. Able to provide necessary safety cues after demonstration and instruction was provided by OT. Discussed having nonslip surface for shower floor and advised them to install grab bars vs use their suction grab bars (which they report fall off). Pt with fall hx in shower, and recommended for spouse to purchase a wider seat so he could lean laterally for perihygiene (I.e tub bench) to maximize safety.  She presently has a stool without a back. Discussed having pt wear nonslip foorwear during shower transfers and dressing sit<stand from their toilet after. Educated [place] about pts current functional level with ADLs and cuing required by primary therapist. Silva Bandy verbalized understanding, and that it was hard to step back and let pt do for himself, however she was mindful to be better about this. Also reviewed ambulatory toilet transfers using RW. Phyllis with hands on practice and cleared on safety plan to assist him to the restroom. She provided appropriate cues for DME mgt and UE position as pt often abandons device and plops down on transfer surface. Recommended for spouse to set up [facility] beside bed for nighttime use, as their bathroom is far away from bedroom. Practiced supine<sit from flat bed without bedrails (per setup at home). Pt unable to do this without Min-Mod A, and his spouse has physical ailments as well. Discussed purchase of bedrail to fit beneath mattress. Showed her examples of what these bedrails look like online, with Silva Bandy verbalizing she would have [place] look into purchasing for home. Provided pt with B UE HEP for maintaining current functional level post d/c, and reviewed this with pt and spouse during session. Spouse actively completing exercises alongside OT and pt. Spouse very receptive to all education and actively encouraging pt. Also advised including spouse in daily IADL tasks at home to further improve functional skills and DME safety. At end of session pt transferred back to bed and was left with spouse. Spouse Silva Bandy reported she would let staff know when she left so they could put bed alarm on.   OT Discharge Precautions/Restrictions  Precautions Precautions: Fall Pain: No c/o pain during tx   ADL  ADL Grooming: Supervision/setup Where Assessed-Grooming: Standing at sink Upper Body Bathing: Supervision/safety Where Assessed-Upper Body Bathing: Shower Lower Body  Bathing: Supervision/safety Where Assessed-Lower Body Bathing: Shower Upper Body Dressing: Supervision/safety Where Assessed-Upper Body Dressing: Wheelchair Lower Body Dressing: Supervision/safety Where Assessed-Lower Body Dressing: Wheelchair Toileting: Supervision/safety Where Assessed-Toileting: Glass blower/designer: Close supervision Toilet Transfer Method: Counselling psychologist: Grab bars(and RW) Social research officer, government: Environmental education officer Method: Heritage manager: Civil engineer, contracting with back, Grab bars(and RW) Vision Baseline Vision/History: Wears glasses Wears Glasses: Reading only Patient Visual Report: No change from baseline Perception  Perception: Within Functional Limits Praxis Praxis: Intact Cognition Overall Cognitive Status: Impaired/Different from baseline Arousal/Alertness: Awake/alert Orientation Level: Oriented X4 Awareness: Impaired Awareness Impairment: Emergent impairment;Anticipatory impairment Problem Solving: Impaired Safety/Judgment: Impaired Sensation Coordination Gross Motor Movements are Fluid and Coordinated: No Fine Motor Movements are Fluid and Coordinated: No Coordination and Movement Description: Mild Lt sided hemiplegia  Motor  Motor Motor: Abnormal postural alignment and control Mobility    Minimal assistance walk in shower transfers Close supervision toilet transfers  Trunk/Postural Assessment  Cervical Assessment Cervical Assessment: Within Functional Limits Thoracic Assessment Thoracic Assessment: Within Functional Limits Lumbar Assessment Lumbar Assessment: Within Functional Limits Postural Control Postural Control: Deficits on evaluation(Pt losing balance when sitting EOB with weights during HEP completion)  Balance Balance Balance Assessed: Yes Dynamic Sitting Balance Dynamic Sitting - Level of Assistance: 5: Stand by assistance Sitting balance - Comments:  doffing/donning gripper socks EOB Dynamic Standing Balance Dynamic Standing - Level of Assistance: 5: Stand by assistance(brushing teeth at sink) Extremity/Trunk Assessment RUE Assessment RUE Assessment: Within Functional Limits LUE Assessment LUE Assessment: Exceptions to [facility] LUE Body System: Neuro Brunstrum levels for arm and hand: Arm;Hand Brunstrum level for arm: Stage V Relative Independence from Synergy Brunstrum level for hand: Stage V Independence from basic synergies   Areg Bialas A Dario Yono 07/25/2018, 5:42 PM

## 2018-07-25 NOTE — Progress Notes (Signed)
Hypoglycemic Event  CBG:67  Treatment: 4 oz juice/soda  Symptoms: None  Follow-up CBG: Time:2150 CBG Result:170  Possible Reasons for Event: Unknown  Comments/MD notified:yes    Andre Wolfe  [name]

## 2018-07-25 NOTE — Progress Notes (Signed)
[facility] PHYSICAL MEDICINE & REHABILITATION PROGRESS NOTE   Subjective/Complaints: Patient seen laying in bed this morning.  He states he slept well overnight.  He states he is looking forward to discharge tomorrow.  Review of systems: Denies CP, SOB, N/V/D  Objective:   Constitutional: No distress . Vital signs reviewed. HENT: Normocephalic.  Atraumatic. Eyes: EOMI. No discharge. Cardiovascular: RRR. No JVD. Respiratory: CTA Bilaterally. Normal effort. GI: BS +. Non-distended. Musc: No edema or tenderness in extremities. Neurologic: Alert Motor: Grossly 5/5 throughout with decreased [place] on LUE Psych: Normal behavior.  Normal affect.  No results found. No results for input(s): WBC, HGB, HCT, PLT in the last 72 hours. No results for input(s): NA, K, CL, CO2, GLUCOSE, BUN, CREATININE, CALCIUM in the last 72 hours.  Intake/Output Summary (Last 24 hours) at 07/25/2018 1014 Last data filed at 07/25/2018 0745 Gross per 24 hour  Intake 900 ml  Output 300 ml  Net 600 ml     Physical Exam: Vital Signs Blood pressure (!) 158/68, pulse (!) 55, temperature 98.2 F (36.8 C), temperature source Oral, resp. rate 18, height 6' (1.829 m), weight 93.3 kg, SpO2 100 %.    General: No acute distress Mood and affect are appropriate Heart: bradycardia irregular ,no rubs murmurs or extra sounds Lungs: Clear to auscultation, breathing unlabored, no rales or wheezes Abdomen: Positive bowel sounds, soft nontender to palpation, nondistended Extremities: No clubbing, cyanosis, or edema Skin: No evidence of breakdown, no evidence of rash Neurologic: Cranial nerves II through XII intact, motor strength is 5/5 in bilateral deltoid, bicep, tricep, grip, hip flexor, knee extensors, ankle dorsiflexor and plantar flexor Sensory exam normal sensation to light touch and proprioception in bilateral upper and lower extremities Cerebellar exam normal finger to nose to finger as well as heel to shin in  bilateral upper and lower extremities Musculoskeletal: Full range of motion in all 4 extremities. No joint swelling  Assessment/Plan: 1. 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  Physiatrist is providing close team supervision and 24 hour management of active medical problems listed below.  Physiatrist and rehab team continue to assess barriers to discharge/monitor patient progress toward functional and medical goals  Care Tool:  Bathing    Body parts bathed by patient: Right arm, Left arm, Chest, Abdomen, Front perineal area, Buttocks, Right upper leg, Left upper leg, Right lower leg, Left lower leg, Face         Bathing assist Assist Level: Supervision/Verbal cueing     Upper Body Dressing/Undressing Upper body dressing   What is the patient wearing?: Pull over shirt    Upper body assist Assist Level: Supervision/Verbal cueing    Lower Body Dressing/Undressing Lower body dressing      What is the patient wearing?: Underwear/pull up, Pants     Lower body assist Assist for lower body dressing: Supervision/Verbal cueing     Toileting Toileting    Toileting assist Assist for toileting: Supervision/Verbal cueing     Transfers Chair/bed transfer  Transfers assist     Chair/bed transfer assist level: Supervision/Verbal cueing     Locomotion Ambulation   Ambulation assist      Assist level: Supervision/Verbal cueing Assistive device: Walker-rolling(L foot up) Max distance: 150 ft    Walk 10 feet activity   Assist     Assist level: Supervision/Verbal cueing Assistive device: Walker-rolling(L foot up)   Walk  50 feet activity   Assist    Assist level: Supervision/Verbal cueing Assistive device: Walker-rolling(L foot up)    Walk 150 feet activity   Assist Walk 150 feet activity did not occur:  Safety/medical concerns  Assist level: Supervision/Verbal cueing Assistive device: Walker-rolling(L foot up)    Walk 10 feet on uneven surface  activity   Assist Walk 10 feet on uneven surfaces activity did not occur: Safety/medical concerns   Assist level: Moderate Assistance - Patient - 50 - 74% Assistive device: Aeronautical engineer Will patient use wheelchair at discharge?: No Type of Wheelchair: Manual Wheelchair activity did not occur: N/A  Wheelchair assist level: Supervision/Verbal cueing Max wheelchair distance: 129ft    Wheelchair 50 feet with 2 turns activity    Assist    Wheelchair 50 feet with 2 turns activity did not occur: N/A   Assist Level: Supervision/Verbal cueing   Wheelchair 150 feet activity     Assist Wheelchair 150 feet activity did not occur: N/A        Medical Problem List and Plan: 1.Functional deficits and left hemiparesissecondary to right pontine (as well as small left parietal) embolic infarcts  Continue CIR  Plan for d/c tomorrow  Patient to see rehab MD for transitional care management in 1-2 weeks post-discharge  Notes reviewed- stroke, images reviewed- right brainstem infarct, labs reviewed  2. DVT Prophylaxis/Anticoagulation: Pharmaceutical:Other (comment)eliquis 3. Pain Management:tylenol 4. Mood:team to provide ego support.  5. Neuropsych: This patientiscapable of making decisions on hisown behalf. 6. Skin/Wound Care:encourage appropriate nutrition. Local care as needed 7. Fluids/Electrolytes/Nutrition:encourage PO, follow up labs on admit normal basic metabolic package 08/24/4763 8. A fib with RVR: eliquis for stroke proph -lopressor -outpt follow up with cardiology Vitals:   07/24/18 2045 07/25/18 0502  BP:  (!) 158/68  Pulse: 60 (!) 55  Resp:  18  Temp:  98.2 F (36.8 C)  SpO2:  100%  9. CAD with history of  CABG: -eliquis -beta blocker, repatha  10.  Diabetes on 70/30 insulin per endocrinology.   CBG (last 3)  Recent Labs    07/24/18 1653 07/24/18 2128 07/25/18 0633  GLUCAP 118* 156* 111*    cont current meds 70/30 25U qam and q pm, 5U noon  Labile on 1/11 11.  Hypertension  Continue amlodipine 2.5, Lopressor 12.5 twice daily  ?  Trending up on 1/11  Continue to monitor for trend, will need ambulatory following with potential adjustments  LOS: 9 days A FACE TO FACE EVALUATION WAS PERFORMED  Ankit Lorie Phenix 07/25/2018, 10:14 AM

## 2018-07-25 NOTE — Progress Notes (Signed)
Physical Therapy Session Note  Patient Details  Name: Andre Wolfe MRN: [id number] Date of Birth: 01/28/1935  Today's Date: 07/25/2018 PT Individual Time: 1015-1059 PT Individual Time Calculation (min): 44 min   Short Term Goals: Week 1:  PT Short Term Goal 1 (Week 1): STG=LTG due to ELOS   Skilled Therapeutic Interventions/Progress Updates:  Pt was seen bedside in the am. Pt performed all transfers with S and rolling walker. Pt ambulated 150 feet x 3 with rolling walker and S. Pt performed cone taps and alternating cone taps 3 sets x 10 reps each. Pt rode Nu-step 5 minutes at level 3 with B LEs. Pt returned to room following treatment. Pt transferred back to bed with S. Pt left sitting up in bed with bed alarm on and call within reach.   Therapy Documentation Precautions:  Precautions Precautions: Fall Restrictions Weight Bearing Restrictions: No Pain: Pain Assessment Pain Scale: 0-10 Pain Score: 0-No pain    Therapy/Group: Individual Therapy  Dub Amis 07/25/2018, 12:33 PM

## 2018-07-25 NOTE — Progress Notes (Signed)
Patient performed in and out catheterization with RN supervision. Patient stated he can do it at home.

## 2018-07-26 DIAGNOSIS — R001 Bradycardia, unspecified: Secondary | ICD-10-CM

## 2018-07-26 LAB — GLUCOSE, CAPILLARY: Glucose-Capillary: 133 mg/dL — ABNORMAL HIGH (ref 70–99)

## 2018-07-26 NOTE — Progress Notes (Signed)
[facility] PHYSICAL MEDICINE & REHABILITATION PROGRESS NOTE   Subjective/Complaints: Patient seen laying in bed this morning.  He states he slept well overnight.  He is ready for discharge today.  Review of systems: Denies CP, SOB, N/V/D  Objective:   Constitutional: No distress . Vital signs reviewed. HENT: Normocephalic.  Atraumatic. Eyes: EOMI. No discharge. Cardiovascular: RRR.  No JVD. Respiratory: CTA bilaterally.  Normal effort. GI: BS +. Non-distended. Musc: No edema or tenderness in extremities. Neurologic: Alert Motor: Grossly 5/5 throughout with decreased [place] on LUE, unchanged Psych: Normal behavior.  Normal affect.  No results found. No results for input(s): WBC, HGB, HCT, PLT in the last 72 hours. No results for input(s): NA, K, CL, CO2, GLUCOSE, BUN, CREATININE, CALCIUM in the last 72 hours.  Intake/Output Summary (Last 24 hours) at 07/26/2018 0828 Last data filed at 07/26/2018 0741 Gross per 24 hour  Intake 780 ml  Output 600 ml  Net 180 ml     Physical Exam: Vital Signs Blood pressure 128/82, pulse 71, temperature 98.2 F (36.8 C), temperature source Oral, resp. rate 16, height 6' (1.829 m), weight 93.3 kg, SpO2 99 %.    General: No acute distress Mood and affect are appropriate Heart: bradycardia irregular ,no rubs murmurs or extra sounds Lungs: Clear to auscultation, breathing unlabored, no rales or wheezes Abdomen: Positive bowel sounds, soft nontender to palpation, nondistended Extremities: No clubbing, cyanosis, or edema Skin: No evidence of breakdown, no evidence of rash Neurologic: Cranial nerves II through XII intact, motor strength is 5/5 in bilateral deltoid, bicep, tricep, grip, hip flexor, knee extensors, ankle dorsiflexor and plantar flexor Sensory exam normal sensation to light touch and proprioception in bilateral upper and lower extremities Cerebellar exam normal finger to nose to finger as well as heel to shin in bilateral upper and  lower extremities Musculoskeletal: Full range of motion in all 4 extremities. No joint swelling  Assessment/Plan: 1. 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  Physiatrist is providing close team supervision and 24 hour management of active medical problems listed below.  Physiatrist and rehab team continue to assess barriers to discharge/monitor patient progress toward functional and medical goals  Care Tool:  Bathing    Body parts bathed by patient: Right arm, Left arm, Chest, Abdomen, Front perineal area, Buttocks, Right upper leg, Left upper leg, Right lower leg, Left lower leg, Face         Bathing assist Assist Level: Supervision/Verbal cueing(Per most recent OT documentation- pt refused to complete bathing during grad day OT session)     Upper Body Dressing/Undressing Upper body dressing   What is the patient wearing?: Pull over shirt(per most recent OT documentation)    Upper body assist Assist Level: Supervision/Verbal cueing    Lower Body Dressing/Undressing Lower body dressing      What is the patient wearing?: Underwear/pull up, Pants     Lower body assist Assist for lower body dressing: Supervision/Verbal cueing(per most recent OT documentation)     Toileting Toileting    Toileting assist Assist for toileting: Supervision/Verbal cueing(per most recent staff documentation)     Transfers Chair/bed transfer  Transfers assist     Chair/bed transfer assist level: Supervision/Verbal cueing     Locomotion Ambulation   Ambulation assist      Assist level: Supervision/Verbal cueing Assistive device: Walker-rolling(L foot up) Max distance: 150   Walk  10 feet activity   Assist     Assist level: Supervision/Verbal cueing Assistive device: Walker-rolling   Walk 50 feet activity   Assist     Assist level: Supervision/Verbal cueing Assistive device: Walker-rolling    Walk 150 feet activity   Assist Walk 150 feet activity did not occur: Safety/medical concerns  Assist level: Supervision/Verbal cueing Assistive device: Walker-rolling    Walk 10 feet on uneven surface  activity   Assist Walk 10 feet on uneven surfaces activity did not occur: Safety/medical concerns   Assist level: Moderate Assistance - Patient - 50 - 74% Assistive device: Aeronautical engineer Will patient use wheelchair at discharge?: No Type of Wheelchair: Manual Wheelchair activity did not occur: N/A  Wheelchair assist level: Supervision/Verbal cueing Max wheelchair distance: 175ft    Wheelchair 50 feet with 2 turns activity    Assist    Wheelchair 50 feet with 2 turns activity did not occur: N/A   Assist Level: Supervision/Verbal cueing   Wheelchair 150 feet activity     Assist Wheelchair 150 feet activity did not occur: N/A        Medical Problem List and Plan: 1.Functional deficits and left hemiparesissecondary to right pontine (as well as small left parietal) embolic infarcts  DC today  Patient to see rehab MD for transitional care management in 1-2 weeks post-discharge 2. DVT Prophylaxis/Anticoagulation: Pharmaceutical:Other (comment)eliquis 3. Pain Management:tylenol 4. Mood:team to provide ego support.  5. Neuropsych: This patientiscapable of making decisions on hisown behalf. 6. Skin/Wound Care:encourage appropriate nutrition. Local care as needed 7. Fluids/Electrolytes/Nutrition:encourage PO, follow up labs on admit normal basic metabolic package 07/15/6577 8. A fib with RVR: eliquis for stroke proph -lopressor -outpt follow up with cardiology Vitals:   07/25/18 1942 07/26/18 0506  BP: (!) 156/74 128/82  Pulse: (!) 56 71  Resp: 16 16  Temp: 97.8 F (36.6 C) 98.2 F (36.8 C)  SpO2: 97% 99%  9.  CAD with history of CABG: -eliquis -beta blocker, repatha  10.  Diabetes on 70/30 insulin per endocrinology.   CBG (last 3)  Recent Labs    07/25/18 2110 07/25/18 2151 07/26/18 0620  GLUCAP 67* 170* 133*    cont current meds 70/30 25U qam and q pm, 5U noon  Labile on 1/12 11.  Hypertension  Continue amlodipine 2.5, Lopressor 12.5 twice daily  Improved on 1/12, will need ambulatory following with potential adjustments  LOS: 10 days A FACE TO FACE EVALUATION WAS PERFORMED   Lorie Phenix 07/26/2018, 8:28 AM

## 2018-07-26 NOTE — Progress Notes (Signed)
Patient educated on how to cath himself and he was able to perform self cath with the supervision of this RN. We continue to monitor.

## 2018-07-26 NOTE — Progress Notes (Signed)
Patient given discharge instructions by Algis Liming, PA. Questions answered today bu nurse regarding medication changes and follow up appointments. Patient wheeled down by Nurse tech and family with all his personal belongings.  Nicholes Rough, LPN

## 2018-07-27 DIAGNOSIS — M1991 Primary osteoarthritis, unspecified site: Secondary | ICD-10-CM | POA: Diagnosis not present

## 2018-07-27 DIAGNOSIS — I252 Old myocardial infarction: Secondary | ICD-10-CM | POA: Diagnosis not present

## 2018-07-27 DIAGNOSIS — Z7982 Long term (current) use of aspirin: Secondary | ICD-10-CM | POA: Diagnosis not present

## 2018-07-27 DIAGNOSIS — Z87891 Personal history of nicotine dependence: Secondary | ICD-10-CM | POA: Diagnosis not present

## 2018-07-27 DIAGNOSIS — I2510 Atherosclerotic heart disease of native coronary artery without angina pectoris: Secondary | ICD-10-CM | POA: Diagnosis not present

## 2018-07-27 DIAGNOSIS — Z794 Long term (current) use of insulin: Secondary | ICD-10-CM | POA: Diagnosis not present

## 2018-07-27 DIAGNOSIS — I69354 Hemiplegia and hemiparesis following cerebral infarction affecting left non-dominant side: Secondary | ICD-10-CM | POA: Diagnosis not present

## 2018-07-27 DIAGNOSIS — I10 Essential (primary) hypertension: Secondary | ICD-10-CM | POA: Diagnosis not present

## 2018-07-27 DIAGNOSIS — Z7901 Long term (current) use of anticoagulants: Secondary | ICD-10-CM | POA: Diagnosis not present

## 2018-07-27 DIAGNOSIS — Z951 Presence of aortocoronary bypass graft: Secondary | ICD-10-CM | POA: Diagnosis not present

## 2018-07-27 DIAGNOSIS — Z8546 Personal history of malignant neoplasm of prostate: Secondary | ICD-10-CM | POA: Diagnosis not present

## 2018-07-27 DIAGNOSIS — I480 Paroxysmal atrial fibrillation: Secondary | ICD-10-CM | POA: Diagnosis not present

## 2018-07-27 DIAGNOSIS — E1142 Type 2 diabetes mellitus with diabetic polyneuropathy: Secondary | ICD-10-CM | POA: Diagnosis not present

## 2018-07-27 DIAGNOSIS — E669 Obesity, unspecified: Secondary | ICD-10-CM | POA: Diagnosis not present

## 2018-07-27 DIAGNOSIS — Z9181 History of falling: Secondary | ICD-10-CM | POA: Diagnosis not present

## 2018-07-27 DIAGNOSIS — Z6829 Body mass index (BMI) 29.0-29.9, adult: Secondary | ICD-10-CM | POA: Diagnosis not present

## 2018-07-28 ENCOUNTER — Telehealth: Payer: Self-pay | Admitting: *Deleted

## 2018-07-28 NOTE — Telephone Encounter (Addendum)
Transitional Care call-1st attempt 1:18 (07/28/18) LVM to call office  2nd attempt, got VM 4:24pm(07/28/18) Mr Hargadon daughter called back and left message and we should speak later today(07/29/18) 12:48 07/29/18 spoke with [name], Mr Truell's daughter.    1. Are you/is patient experiencing any problems since coming home? Are there any questions regarding any aspect of care? NO 2. Are there any questions regarding medications administration/dosing? Are meds being taken as prescribed? Patient should review meds with caller to confirm YES HE HAS ALL MEDICATIONS AND TAKING AS ORDERED, REPORTED BY HIS DAUGHTER WHO WORKS FOR ANOTHER PHYSICIANS OFFICE 3. Have there been any falls? NO FALLS, DID HAVE ONE CONTROLLED SIT DOWN IN THE FLOOR 4. Has Home Health been to the house and/or have they contacted you? If not, have you tried to contact them? Can we help you contact them? YES INITIAL CONTACT HAS BEEN MADE (START OF CARE) BUT NO FURTHER ARRANGEMENTS AS OF NOW HAVE BEEN MADE 5. Are bowels and bladder emptying properly? Are there any unexpected incontinence issues? If applicable, is patient following bowel/bladder programs? NO ISSUES WITH EXCEPTION OF WHEN HE HAS TO GO HE HAS TO GET THERE QUICK (WHICH CAN INCREASE RISK OF FALL). 6. Any fevers, problems with breathing, unexpected pain? NO 7. Are there any skin problems or new areas of breakdown? NO 8. Has the patient/family member arranged specialty MD follow up (ie cardiology/neurology/renal/surgical/etc)?  Can we help arrange? YES ALL APPOINTMENTS HAVE BEEN MADE. CHPMR 08/05/18, CARDIOLOGY 08/06/18, PRIMARY CARE 08/06/18, AND NEUROLOGY 09/02/18 9. Does the patient need any other services or support that we can help arrange? NO 10. Are caregivers following through as expected in assisting the patient? YES       11. Has the patient quit smoking, drinking alcohol, or using drugs as recommended? N/A    Appointment Tuesday 08/05/18 @ 9:20 arrive by 9:00 to see Danella Sensing NP then Dr Letta Pate thereafter. [place]

## 2018-07-30 ENCOUNTER — Ambulatory Visit: Payer: Medicare Other | Admitting: Cardiology

## 2018-07-30 DIAGNOSIS — I69354 Hemiplegia and hemiparesis following cerebral infarction affecting left non-dominant side: Secondary | ICD-10-CM | POA: Diagnosis not present

## 2018-07-30 DIAGNOSIS — I480 Paroxysmal atrial fibrillation: Secondary | ICD-10-CM | POA: Diagnosis not present

## 2018-07-30 DIAGNOSIS — I10 Essential (primary) hypertension: Secondary | ICD-10-CM | POA: Diagnosis not present

## 2018-07-30 DIAGNOSIS — I2510 Atherosclerotic heart disease of native coronary artery without angina pectoris: Secondary | ICD-10-CM | POA: Diagnosis not present

## 2018-07-30 DIAGNOSIS — M1991 Primary osteoarthritis, unspecified site: Secondary | ICD-10-CM | POA: Diagnosis not present

## 2018-07-30 DIAGNOSIS — E1142 Type 2 diabetes mellitus with diabetic polyneuropathy: Secondary | ICD-10-CM | POA: Diagnosis not present

## 2018-07-31 ENCOUNTER — Telehealth: Payer: Self-pay

## 2018-07-31 NOTE — Telephone Encounter (Signed)
Andre Wolfe, OT from [facility] called requesting verbal orders for HHOT 1wk1, 2wk7, 1wk1. Orders approved and given per discharge summary.

## 2018-08-04 DIAGNOSIS — E1142 Type 2 diabetes mellitus with diabetic polyneuropathy: Secondary | ICD-10-CM | POA: Diagnosis not present

## 2018-08-04 DIAGNOSIS — I69354 Hemiplegia and hemiparesis following cerebral infarction affecting left non-dominant side: Secondary | ICD-10-CM | POA: Diagnosis not present

## 2018-08-04 DIAGNOSIS — I10 Essential (primary) hypertension: Secondary | ICD-10-CM | POA: Diagnosis not present

## 2018-08-04 DIAGNOSIS — I2510 Atherosclerotic heart disease of native coronary artery without angina pectoris: Secondary | ICD-10-CM | POA: Diagnosis not present

## 2018-08-04 DIAGNOSIS — M1991 Primary osteoarthritis, unspecified site: Secondary | ICD-10-CM | POA: Diagnosis not present

## 2018-08-04 DIAGNOSIS — I480 Paroxysmal atrial fibrillation: Secondary | ICD-10-CM | POA: Diagnosis not present

## 2018-08-05 ENCOUNTER — Other Ambulatory Visit: Payer: Self-pay

## 2018-08-05 ENCOUNTER — Encounter: Payer: Self-pay | Admitting: Registered Nurse

## 2018-08-05 ENCOUNTER — Encounter: Payer: Medicare Other | Attending: Registered Nurse | Admitting: Registered Nurse

## 2018-08-05 ENCOUNTER — Encounter: Payer: Medicare Other | Admitting: Registered Nurse

## 2018-08-05 VITALS — BP 147/80 | HR 55 | Ht 69.0 in | Wt 216.0 lb

## 2018-08-05 DIAGNOSIS — I6350 Cerebral infarction due to unspecified occlusion or stenosis of unspecified cerebral artery: Secondary | ICD-10-CM | POA: Insufficient documentation

## 2018-08-05 DIAGNOSIS — I480 Paroxysmal atrial fibrillation: Secondary | ICD-10-CM | POA: Diagnosis not present

## 2018-08-05 DIAGNOSIS — E1165 Type 2 diabetes mellitus with hyperglycemia: Secondary | ICD-10-CM | POA: Diagnosis not present

## 2018-08-05 DIAGNOSIS — E1142 Type 2 diabetes mellitus with diabetic polyneuropathy: Secondary | ICD-10-CM | POA: Insufficient documentation

## 2018-08-05 DIAGNOSIS — Z87898 Personal history of other specified conditions: Secondary | ICD-10-CM | POA: Diagnosis not present

## 2018-08-05 DIAGNOSIS — I10 Essential (primary) hypertension: Secondary | ICD-10-CM | POA: Insufficient documentation

## 2018-08-05 NOTE — Progress Notes (Signed)
Subjective:    Patient ID: Andre Wolfe, male    DOB: 1934/11/25, 84 y.o.   MRN: [id number]  HPI: Andre Wolfe is a 84 y.o. male who is here for Transitional care visit appointment. He presented to [facility] on 07/13/2018 with left leg weakness. On Christmas day family noticed a change with walking, talking, weakness and increased falls.  CT Head WO Contrast and MRI Brain was ordered.   CT Head WO Contrast:  IMPRESSION: Right pontine infarct is age indeterminate. Left occipital lobe infarct appears more remote but also can not be definitively characterized.  Atrophy and chronic microvascular ischemic change.  Atherosclerosis.  MR Brain WO Contrast:   IMPRESSION: MRI head:  1. Right paramedian pons acute/early subacute infarction. Additional punctate infarction in left parietal cortex. No hemorrhage or mass effect. 2. Mild for age chronic microvascular ischemic changes and volume loss of the brain.  MRA head:  1. No large vessel occlusion or aneurysm identified. 2. Intracranial atherosclerosis with multiple segments of mild-to-moderate stenosis in the anterior and posterior circulation.  Andre Wolfe developed Atrial fibrillation after admission and was started on IV heparin and transitioned to Eliquis.  Andre Wolfe daughter reports the Eliquis cost $457.00, she was encouraged to speak with cardiology, he has a scheduled appointment on tomorrow.   Andre Wolfe was admitted to [facility] on 07/16/2018 and discharged on 07/26/2018. Today he is here for Transitional Care Visit in follow up of his Right Pontine Stroke, PAF, Hypertension, Poorly Controlled Type 2 DM and History of Urinary Retention.  He was discharged home, receiving [place] with [facility]. He denies pain. He rated his pain 0. Also reports good appetite.   Andre Wolfe daughter in son in room, all questions answered.   Pain Inventory Average Pain 0 Pain Right Now 0 My pain is no  pain  In the last 24 hours, has pain interfered with the following? General activity 0 Relation with others 0 Enjoyment of life 0 What TIME of day is your pain at its worst? no pain Sleep (in general) Fair  Pain is worse with: no pain Pain improves with: no pain Relief from Meds: 0  Mobility walk without assistance walk with assistance use a cane use a walker ability to climb steps?  no do you drive?  no use a wheelchair needs help with transfers transfers alone  Function retired I need assistance with the following:  bathing and toileting  Neuro/Psych bladder control problems bowel control problems weakness numbness trouble walking dizziness  Prior Studies Any changes since last visit?  no  Physicians involved in your care Any changes since last visit?  no Primary care Dr. Jacelyn Grip Neurologist Dr. Erlinda Hong Dr. [place]   Family History  Problem Relation Age of Onset  . Heart disease Mother   . Breast cancer Mother   . Leukemia Father   . Diabetes Brother   . Breast cancer Sister   . Stroke Neg Hx    Social History   Socioeconomic History  . Marital status: Married    Spouse name: Not on file  . Number of children: 2  . Years of education: Not on file  . Highest education level: Not on file  Occupational History  . Occupation: farmer  Social Needs  . Financial resource strain: Not on file  . Food insecurity:    Worry: Not on file    Inability: Not on file  . Transportation needs:    Medical:  Not on file    Non-medical: Not on file  Tobacco Use  . Smoking status: Current Every Day Smoker    Packs/day: 0.12    Years: 65.00    Pack years: 7.80  . Smokeless tobacco: Never Used  Substance and Sexual Activity  . Alcohol use: Not Currently  . Drug use: Never  . Sexual activity: Not on file  Lifestyle  . Physical activity:    Days per week: Not on file    Minutes per session: Not on file  . Stress: Not on file  Relationships  . Social connections:      Talks on phone: Not on file    Gets together: Not on file    Attends religious service: Not on file    Active member of club or organization: Not on file    Attends meetings of clubs or organizations: Not on file    Relationship status: Not on file  Other Topics Concern  . Not on file  Social History Narrative  . Not on file   Past Surgical History:  Procedure Laterality Date  . CARDIAC CATHETERIZATION  2017   "not completed; went straight to [facility]"  . CATARACT EXTRACTION W/ INTRAOCULAR LENS  IMPLANT, BILATERAL Bilateral   . CORONARY ARTERY BYPASS GRAFT  2017   "CABG X 4"  . TONSILLECTOMY     Past Medical History:  Diagnosis Date  . Arrhythmia    post op Afib.  . Arthritis    "joints" (07/13/2018)  . Coronary artery disease    a. s/p CABG 12/2015.  Marland Kitchen CVA (cerebral vascular accident) ([place]) 07/13/2018   right pontine infarct; "said he's had one in the past; probably 2 yrs ago; same symptoms" (07/13/2018)  . Diabetic peripheral neuropathy ([place])   . GERD (gastroesophageal reflux disease)   . Hyperlipidemia   . Hypertension   . Postoperative atrial fibrillation ([place])   . Prostate cancer ([place])    d'x 2015/2016  . TIA (transient ischemic attack)   . Type II diabetes mellitus ([facility])    BP (!) 147/80   Pulse (!) 55   Ht 5\' 9"  (1.753 m)   Wt 216 lb (98 kg)   SpO2 97%   BMI 31.90 kg/m   Opioid Risk Score:   Fall Risk Score:  `1  Depression screen PHQ 2/9  Depression screen PHQ 2/9 08/05/2018  Decreased Interest 1  Down, Depressed, Hopeless 1  PHQ - 2 Score 2  Altered sleeping 1  Tired, decreased energy 1  Change in appetite 0  Trouble concentrating 0  Moving slowly or fidgety/restless 0  Suicidal thoughts 0  PHQ-9 Score 4  Difficult doing work/chores Not difficult at all    Review of Systems  Constitutional: Negative.        High blood sugar  HENT: Negative.   Eyes: Negative.   Respiratory: Negative.   Cardiovascular: Negative.   Gastrointestinal:  Negative.   Endocrine: Negative.   Genitourinary: Negative.   Musculoskeletal: Negative.   Skin: Negative.   Allergic/Immunologic: Negative.   Neurological: Negative.   Hematological: Negative.   Psychiatric/Behavioral: Negative.   All other systems reviewed and are negative.      Objective:   Physical Exam Vitals signs and nursing note reviewed.  Constitutional:      Appearance: Normal appearance.  Neck:     Musculoskeletal: Normal range of motion and neck supple.  Cardiovascular:     Rate and Rhythm: Normal rate and regular rhythm.  Pulses: Normal pulses.     Heart sounds: Normal heart sounds.  Pulmonary:     Effort: Pulmonary effort is normal.     Breath sounds: Normal breath sounds.  Musculoskeletal:     Comments: Normal Muscle Bulk and Muscle Testing Reveals:  Upper Extremities: Full ROM and Muscle Strength on the Right 5/5 and Left 4/5 Lower Extremities: Full ROM and Muscle Strength 5/5 Arrived in wheelchair  Skin:    General: Skin is warm and dry.  Neurological:     Mental Status: He is alert and oriented to person, place, and time.  Psychiatric:        Mood and Affect: Mood normal.        Behavior: Behavior normal.           Assessment & Plan:  1. Right Pontine Stroke: Continue Home Health Therapy with [facility]. Has a scheduled Neurology Appointment.  2. PAF: Continue current medication regimen: Has a scheduled appointment with Cardiology.  3. Hypertension: Continue current medication regimen. PCP Following.  4. H/O urinary Retention: Continue with Daily Cath/ Has a scheduled appointment with Urology.  5. Poorly Controlled Type 2 DM:  Was controlled at Discharge: Continue current medication regime. PCP Following.   30 minutes of face to face patient care time was spent during this visit. All questions were encouraged and answered.  F/U with Dr. Letta Pate in 4- 6 weeks

## 2018-08-06 ENCOUNTER — Encounter: Payer: Self-pay | Admitting: Cardiology

## 2018-08-06 ENCOUNTER — Ambulatory Visit (INDEPENDENT_AMBULATORY_CARE_PROVIDER_SITE_OTHER): Payer: Medicare Other | Admitting: Medical

## 2018-08-06 VITALS — BP 140/68 | HR 56 | Ht 69.0 in | Wt 209.0 lb

## 2018-08-06 DIAGNOSIS — Z7901 Long term (current) use of anticoagulants: Secondary | ICD-10-CM | POA: Diagnosis not present

## 2018-08-06 DIAGNOSIS — G8194 Hemiplegia, unspecified affecting left nondominant side: Secondary | ICD-10-CM | POA: Diagnosis not present

## 2018-08-06 DIAGNOSIS — E1142 Type 2 diabetes mellitus with diabetic polyneuropathy: Secondary | ICD-10-CM

## 2018-08-06 DIAGNOSIS — Z09 Encounter for follow-up examination after completed treatment for conditions other than malignant neoplasm: Secondary | ICD-10-CM | POA: Diagnosis not present

## 2018-08-06 DIAGNOSIS — I6350 Cerebral infarction due to unspecified occlusion or stenosis of unspecified cerebral artery: Secondary | ICD-10-CM | POA: Diagnosis not present

## 2018-08-06 DIAGNOSIS — I10 Essential (primary) hypertension: Secondary | ICD-10-CM | POA: Diagnosis not present

## 2018-08-06 DIAGNOSIS — I25810 Atherosclerosis of coronary artery bypass graft(s) without angina pectoris: Secondary | ICD-10-CM | POA: Diagnosis not present

## 2018-08-06 DIAGNOSIS — I2510 Atherosclerotic heart disease of native coronary artery without angina pectoris: Secondary | ICD-10-CM | POA: Diagnosis not present

## 2018-08-06 DIAGNOSIS — I480 Paroxysmal atrial fibrillation: Secondary | ICD-10-CM | POA: Diagnosis not present

## 2018-08-06 DIAGNOSIS — Z8673 Personal history of transient ischemic attack (TIA), and cerebral infarction without residual deficits: Secondary | ICD-10-CM

## 2018-08-06 DIAGNOSIS — E7800 Pure hypercholesterolemia, unspecified: Secondary | ICD-10-CM | POA: Diagnosis not present

## 2018-08-06 DIAGNOSIS — Z794 Long term (current) use of insulin: Secondary | ICD-10-CM | POA: Diagnosis not present

## 2018-08-06 DIAGNOSIS — Z5181 Encounter for therapeutic drug level monitoring: Secondary | ICD-10-CM | POA: Diagnosis not present

## 2018-08-06 MED ORDER — APIXABAN 5 MG PO TABS: 5.0000 mg | ORAL_TABLET | Freq: Two times a day (BID) | ORAL | 4 refills | Status: DC

## 2018-08-06 NOTE — Patient Instructions (Signed)
Medication Instructions:  Your physician recommends that you continue on your current medications as directed. Please refer to the Current Medication list given to you today. If you need a refill on your cardiac medications before your next appointment, please call your pharmacy.   Lab work: None  If you have labs (blood work) drawn today and your tests are completely normal, you will receive your results only by: Marland Kitchen MyChart Message (if you have MyChart) OR . A paper copy in the mail If you have any lab test that is abnormal or we need to change your treatment, we will call you to review the results.  Testing/Procedures: None   Follow-Up: At [facility], you and your health needs are our priority.  As part of our continuing mission to provide you with exceptional heart care, we have created designated Provider Care Teams.  These Care Teams include your primary Cardiologist (physician) and Advanced Practice Providers (APPs -  Physician Assistants and Nurse Practitioners) who all work together to provide you with the care you need, when you need it.  Your physician recommends that you schedule a follow-up appointment in: 3 months with Dr [place] ONLY.  Any Other Special Instructions Will Be Listed Below (If Applicable). Patient assistance forms for Eliquis given

## 2018-08-06 NOTE — Progress Notes (Signed)
Cardiology Office Note   Date:  08/06/2018   ID:  Andre Wolfe, DOB 1934-11-27, MRN [id number]  PCP:  [name], MD  Cardiologist:  Peter Martinique, MD EP: None  Chief Complaint  Patient presents with  . Hospitalization Follow-up      History of Present Illness: Andre Wolfe is a 84 y.o. male with a PMH of CAD s/p CABG 2017 with post op atrial fibrillation, HTN, HLD, CVA/TIAs, IDDM, peripheral neuropathy, and prostate cancer who presents for post-hospital follow-up.   He was last seen by cardiology while admitted to the hospital 06/2018 for a stroke. He has a history of post-CABG atrial fibrillation s/p cardioversion in 2017. He was seen by Dr. Martinique outpatient 03/2018 and was in NSR without recurrent atrial fibrillation since cardioversion, therefore eliquis was discontinued and he was started on aspirin. Unfortunately he experienced an embolic stroke and telemetry monitoring revealed atrial fibrillation. He was recommended to restart eliquis at that time. He was admitted to CIR from 07/16/2018-07/26/2018.   He presents today for post-hospital follow-up with his daughter. He reports doing well from a cardiac standpoint since discharge from the hospital/CIR. He is continuing to work with home physical therapy, however still having difficulty with ambulation. He denies complaints of chest pain at rest or with exertion, SOB, DOE, palpitations, feeling his heart racing, orthopnea, PND, LE edema, dizziness, lightheadedness, or syncope. He has been tolerating his eliquis without bleeding - denies hematuria, melena, or hematochezia. He reports his eliquis cost $480 per month which is not sustainable for him.    Past Medical History:  Diagnosis Date  . Arrhythmia    post op Afib.  . Arthritis    "joints" (07/13/2018)  . Coronary artery disease    a. s/p CABG 12/2015.  Marland Kitchen CVA (cerebral vascular accident) ([place]) 07/13/2018   right pontine infarct; "said he's had one in the past; probably 2 yrs  ago; same symptoms" (07/13/2018)  . Diabetic peripheral neuropathy ([place])   . GERD (gastroesophageal reflux disease)   . Hyperlipidemia   . Hypertension   . Postoperative atrial fibrillation ([place])   . Prostate cancer ([place])    d'x 2015/2016  . TIA (transient ischemic attack)   . Type II diabetes mellitus ([place])     Past Surgical History:  Procedure Laterality Date  . CARDIAC CATHETERIZATION  2017   "not completed; went straight to [facility]"  . CATARACT EXTRACTION W/ INTRAOCULAR LENS  IMPLANT, BILATERAL Bilateral   . CORONARY ARTERY BYPASS GRAFT  2017   "CABG X 4"  . TONSILLECTOMY       Current Outpatient Medications  Medication Sig Dispense Refill  . acetaminophen (TYLENOL) 325 MG tablet Take 1-2 tablets (325-650 mg total) by mouth every 4 (four) hours as needed for mild pain.    Marland Kitchen amLODipine (NORVASC) 2.5 MG tablet Take 1 tablet (2.5 mg total) by mouth daily. 30 tablet 0  . apixaban (ELIQUIS) 5 MG TABS tablet Take 1 tablet (5 mg total) by mouth 2 (two) times daily. 180 tablet 4  . gabapentin (NEURONTIN) 600 MG tablet Take 600 mg by mouth at bedtime.   1  . Insulin Isophane & Regular Human (NOVOLIN 70/30 FLEXPEN) (70-30) 100 UNIT/ML PEN Inject 5-25 Units into the skin 3 (three) times daily. Taking 25 units in the Am and 5 units Midday, and 25 units before dinner.    . metoprolol tartrate (LOPRESSOR) 25 MG tablet Take 0.5 tablets (12.5 mg total) by mouth 2 (two) times  daily. 30 tablet 0  . omeprazole (PRILOSEC) 20 MG capsule Take 20 mg by mouth daily.    Marland Kitchen senna-docusate (SENOKOT-S) 8.6-50 MG tablet Take 1 tablet by mouth at bedtime as needed for mild constipation.    . tamsulosin (FLOMAX) 0.4 MG CAPS capsule Take 1 capsule (0.4 mg total) by mouth daily after supper. 30 capsule 0  . Evolocumab (REPATHA SURECLICK) 160 MG/ML SOAJ Inject 140 mg into the skin every 14 (fourteen) days. (Patient not taking: Reported on 08/06/2018) 2 pen 11   No current facility-administered medications for this  visit.     Allergies:   Patient has no known allergies.    Social History:  The patient  reports that he has been smoking. He has a 7.80 pack-year smoking history. He has never used smokeless tobacco. He reports previous alcohol use. He reports that he does not use drugs.   Family History:  The patient's family history includes Breast cancer in his mother and sister; Diabetes in his brother; Heart disease in his mother; Leukemia in his father.    ROS:  Please see the history of present illness.   Otherwise, review of systems are positive for none.   All other systems are reviewed and negative.    PHYSICAL EXAM: VS:  BP 140/68   Pulse (!) 56   Ht 5\' 9"  (1.753 m)   Wt 209 lb (94.8 kg)   BMI 30.86 kg/m  , BMI Body mass index is 30.86 kg/m. GEN: Well nourished, well developed, in no acute distress HEENT: sclera anicteric Neck: no JVD, carotid bruits, or masses Cardiac: bradycardic, regular rhythm; no murmurs, rubs, or gallops, no edema  Respiratory:  clear to auscultation bilaterally, normal work of breathing GI: soft, nontender, nondistended, + BS MS: no deformity or atrophy Skin: warm and dry, no rash Neuro:  Strength and sensation are intact Psych: euthymic mood, full affect   EKG:  EKG is ordered today. The ekg ordered today demonstrates sinus bradycardia, rate 56, biatrial enlargement, no STE/D, no TWI.    Recent Labs: 07/14/2018: TSH 2.482 07/15/2018: Magnesium 1.6 07/17/2018: ALT 30; Hemoglobin 15.3; Platelets 192 07/21/2018: BUN 14; Creatinine, Ser 1.21; Potassium 4.0; Sodium 138    Lipid Panel    Component Value Date/Time   CHOL 211 (H) 07/14/2018 0013   TRIG 139 07/14/2018 0013   HDL 30 (L) 07/14/2018 0013   CHOLHDL 7.0 07/14/2018 0013   VLDL 28 07/14/2018 0013   LDLCALC 153 (H) 07/14/2018 0013      Wt Readings from Last 3 Encounters:  08/06/18 209 lb (94.8 kg)  08/05/18 216 lb (98 kg)  07/16/18 205 lb 11 oz (93.3 kg)      Other studies  Reviewed: Additional studies/ records that were reviewed today include: .  Echocardiogram 06/2018: Study Conclusions  - Left ventricle: The cavity size was normal. Wall thickness was   normal. Systolic function was normal. The estimated ejection   fraction was in the range of 60% to 65%. Wall motion was normal;   there were no regional wall motion abnormalities. Features are   consistent with a pseudonormal left ventricular filling pattern,   with concomitant abnormal relaxation and increased filling   pressure (grade 2 diastolic dysfunction). Acoustic contrast   opacification revealed no evidence ofthrombus. - Ventricular septum: Septal motion showed paradox. - Mitral valve: Calcified annulus. Valve area by pressure   half-time: 2.32 cm^2. - Left atrium: The atrium was mildly dilated.   ASSESSMENT AND PLAN:   1.  Paroxysmal atrial fibrillation: EKG with sinus bradycardia (rate 56) today. He denies issues with palpitations or racing heart beat sensation since discharge. He is tolerating eliquis without bleeding complications, however the cost of this medication is a challenge ($480/mo).  - Continue metoprolol 12.5mg  BID for rate control - Continue eliquis 5mg  BID for stroke ppx - Will submit a medication assistance form to determine if there's any help available for medication cost coverage. If not, may need to consider coumadin for stroke ppx.   2. CAD s/p CABG 2017: no anginal complaints. Not on aspirin given need for anticoagulation - Continue repatha - Continue amlodipine and metoprolol  3. HTN: BP stable today - Continue amlodipine and metoprolol  4. HLD: LDL 153 06/2018; goal <70. Prior intolerance to statins. Seen in the lipid clinic and approved for repatha but has not started yet. - Continue repatha  5. DM type 2: Hgb A1C 8.9; goal <7. He is on insulin - Continue close follow-up with PCP for tighter glycemic control.   6. CVA/TIAs: recent admission for embolic  stroke. Likely 2/2 atrial fibrillation. He continues to regain his functional status slowly and is working with home PT.  - Continue eliquis.     Current medicines are reviewed at length with the patient today.  The patient does not have concerns regarding medicines.  The following changes have been made:  no change  Labs/ tests ordered today include:   Orders Placed This Encounter  Procedures  . EKG 12-Lead     Disposition:   FU with Dr. Martinique in 3 months  Signed, Abigail Butts, PA-C  08/06/2018 12:25 PM

## 2018-08-07 DIAGNOSIS — I10 Essential (primary) hypertension: Secondary | ICD-10-CM | POA: Diagnosis not present

## 2018-08-07 DIAGNOSIS — I69354 Hemiplegia and hemiparesis following cerebral infarction affecting left non-dominant side: Secondary | ICD-10-CM | POA: Diagnosis not present

## 2018-08-07 DIAGNOSIS — M1991 Primary osteoarthritis, unspecified site: Secondary | ICD-10-CM | POA: Diagnosis not present

## 2018-08-07 DIAGNOSIS — I2510 Atherosclerotic heart disease of native coronary artery without angina pectoris: Secondary | ICD-10-CM | POA: Diagnosis not present

## 2018-08-07 DIAGNOSIS — E1142 Type 2 diabetes mellitus with diabetic polyneuropathy: Secondary | ICD-10-CM | POA: Diagnosis not present

## 2018-08-07 DIAGNOSIS — I480 Paroxysmal atrial fibrillation: Secondary | ICD-10-CM | POA: Diagnosis not present

## 2018-08-11 DIAGNOSIS — I10 Essential (primary) hypertension: Secondary | ICD-10-CM | POA: Diagnosis not present

## 2018-08-11 DIAGNOSIS — M1991 Primary osteoarthritis, unspecified site: Secondary | ICD-10-CM | POA: Diagnosis not present

## 2018-08-11 DIAGNOSIS — I480 Paroxysmal atrial fibrillation: Secondary | ICD-10-CM | POA: Diagnosis not present

## 2018-08-11 DIAGNOSIS — I2510 Atherosclerotic heart disease of native coronary artery without angina pectoris: Secondary | ICD-10-CM | POA: Diagnosis not present

## 2018-08-11 DIAGNOSIS — E1142 Type 2 diabetes mellitus with diabetic polyneuropathy: Secondary | ICD-10-CM | POA: Diagnosis not present

## 2018-08-11 DIAGNOSIS — I69354 Hemiplegia and hemiparesis following cerebral infarction affecting left non-dominant side: Secondary | ICD-10-CM | POA: Diagnosis not present

## 2018-08-13 DIAGNOSIS — E1142 Type 2 diabetes mellitus with diabetic polyneuropathy: Secondary | ICD-10-CM | POA: Diagnosis not present

## 2018-08-13 DIAGNOSIS — M1991 Primary osteoarthritis, unspecified site: Secondary | ICD-10-CM | POA: Diagnosis not present

## 2018-08-13 DIAGNOSIS — I69354 Hemiplegia and hemiparesis following cerebral infarction affecting left non-dominant side: Secondary | ICD-10-CM | POA: Diagnosis not present

## 2018-08-13 DIAGNOSIS — I480 Paroxysmal atrial fibrillation: Secondary | ICD-10-CM | POA: Diagnosis not present

## 2018-08-13 DIAGNOSIS — I2510 Atherosclerotic heart disease of native coronary artery without angina pectoris: Secondary | ICD-10-CM | POA: Diagnosis not present

## 2018-08-13 DIAGNOSIS — I10 Essential (primary) hypertension: Secondary | ICD-10-CM | POA: Diagnosis not present

## 2018-08-14 DIAGNOSIS — I69354 Hemiplegia and hemiparesis following cerebral infarction affecting left non-dominant side: Secondary | ICD-10-CM | POA: Diagnosis not present

## 2018-08-14 DIAGNOSIS — E1142 Type 2 diabetes mellitus with diabetic polyneuropathy: Secondary | ICD-10-CM | POA: Diagnosis not present

## 2018-08-14 DIAGNOSIS — M1991 Primary osteoarthritis, unspecified site: Secondary | ICD-10-CM | POA: Diagnosis not present

## 2018-08-14 DIAGNOSIS — I2510 Atherosclerotic heart disease of native coronary artery without angina pectoris: Secondary | ICD-10-CM | POA: Diagnosis not present

## 2018-08-14 DIAGNOSIS — I10 Essential (primary) hypertension: Secondary | ICD-10-CM | POA: Diagnosis not present

## 2018-08-14 DIAGNOSIS — I480 Paroxysmal atrial fibrillation: Secondary | ICD-10-CM | POA: Diagnosis not present

## 2018-08-18 ENCOUNTER — Other Ambulatory Visit (HOSPITAL_COMMUNITY): Payer: Self-pay | Admitting: Physical Medicine and Rehabilitation

## 2018-08-18 DIAGNOSIS — I69354 Hemiplegia and hemiparesis following cerebral infarction affecting left non-dominant side: Secondary | ICD-10-CM | POA: Diagnosis not present

## 2018-08-18 DIAGNOSIS — I480 Paroxysmal atrial fibrillation: Secondary | ICD-10-CM | POA: Diagnosis not present

## 2018-08-18 DIAGNOSIS — I2510 Atherosclerotic heart disease of native coronary artery without angina pectoris: Secondary | ICD-10-CM | POA: Diagnosis not present

## 2018-08-18 DIAGNOSIS — M1991 Primary osteoarthritis, unspecified site: Secondary | ICD-10-CM | POA: Diagnosis not present

## 2018-08-18 DIAGNOSIS — I10 Essential (primary) hypertension: Secondary | ICD-10-CM | POA: Diagnosis not present

## 2018-08-18 DIAGNOSIS — E1142 Type 2 diabetes mellitus with diabetic polyneuropathy: Secondary | ICD-10-CM | POA: Diagnosis not present

## 2018-08-19 ENCOUNTER — Other Ambulatory Visit: Payer: Self-pay | Admitting: Pharmacist Clinician (PhC)/ Clinical Pharmacy Specialist

## 2018-08-19 ENCOUNTER — Other Ambulatory Visit: Payer: Self-pay | Admitting: Cardiology

## 2018-08-19 MED ORDER — APIXABAN 5 MG PO TABS: 5.0000 mg | ORAL_TABLET | Freq: Two times a day (BID) | ORAL | 1 refills | Status: DC

## 2018-08-19 MED ORDER — APIXABAN 5 MG PO TABS: 5.0000 mg | ORAL_TABLET | Freq: Two times a day (BID) | ORAL | 1 refills | Status: AC

## 2018-08-19 NOTE — Telephone Encounter (Signed)
New message    *STAT* If patient is at the pharmacy, call can be transferred to refill team.   1. Which medications need to be refilled? (please list name of each medication and dose if known) apixaban (ELIQUIS) 5 MG TABS tablet  2. Which pharmacy/location (including street and city if local pharmacy) is medication to be sent to?[place], [place] - [address]  3. Do they need a 30 day or 90 day supply? [place]

## 2018-08-20 DIAGNOSIS — E1142 Type 2 diabetes mellitus with diabetic polyneuropathy: Secondary | ICD-10-CM | POA: Diagnosis not present

## 2018-08-20 DIAGNOSIS — I2510 Atherosclerotic heart disease of native coronary artery without angina pectoris: Secondary | ICD-10-CM | POA: Diagnosis not present

## 2018-08-20 DIAGNOSIS — I10 Essential (primary) hypertension: Secondary | ICD-10-CM | POA: Diagnosis not present

## 2018-08-20 DIAGNOSIS — I480 Paroxysmal atrial fibrillation: Secondary | ICD-10-CM | POA: Diagnosis not present

## 2018-08-20 DIAGNOSIS — M1991 Primary osteoarthritis, unspecified site: Secondary | ICD-10-CM | POA: Diagnosis not present

## 2018-08-20 DIAGNOSIS — I69354 Hemiplegia and hemiparesis following cerebral infarction affecting left non-dominant side: Secondary | ICD-10-CM | POA: Diagnosis not present

## 2018-08-21 DIAGNOSIS — I480 Paroxysmal atrial fibrillation: Secondary | ICD-10-CM | POA: Diagnosis not present

## 2018-08-21 DIAGNOSIS — I10 Essential (primary) hypertension: Secondary | ICD-10-CM | POA: Diagnosis not present

## 2018-08-21 DIAGNOSIS — I2510 Atherosclerotic heart disease of native coronary artery without angina pectoris: Secondary | ICD-10-CM | POA: Diagnosis not present

## 2018-08-21 DIAGNOSIS — M1991 Primary osteoarthritis, unspecified site: Secondary | ICD-10-CM | POA: Diagnosis not present

## 2018-08-21 DIAGNOSIS — E1142 Type 2 diabetes mellitus with diabetic polyneuropathy: Secondary | ICD-10-CM | POA: Diagnosis not present

## 2018-08-21 DIAGNOSIS — I69354 Hemiplegia and hemiparesis following cerebral infarction affecting left non-dominant side: Secondary | ICD-10-CM | POA: Diagnosis not present

## 2018-08-22 ENCOUNTER — Other Ambulatory Visit (HOSPITAL_COMMUNITY): Payer: Self-pay | Admitting: Physical Medicine and Rehabilitation

## 2018-08-25 DIAGNOSIS — R300 Dysuria: Secondary | ICD-10-CM | POA: Diagnosis not present

## 2018-08-25 DIAGNOSIS — M1991 Primary osteoarthritis, unspecified site: Secondary | ICD-10-CM | POA: Diagnosis not present

## 2018-08-25 DIAGNOSIS — I480 Paroxysmal atrial fibrillation: Secondary | ICD-10-CM | POA: Diagnosis not present

## 2018-08-25 DIAGNOSIS — E1142 Type 2 diabetes mellitus with diabetic polyneuropathy: Secondary | ICD-10-CM | POA: Diagnosis not present

## 2018-08-25 DIAGNOSIS — I69354 Hemiplegia and hemiparesis following cerebral infarction affecting left non-dominant side: Secondary | ICD-10-CM | POA: Diagnosis not present

## 2018-08-25 DIAGNOSIS — I2510 Atherosclerotic heart disease of native coronary artery without angina pectoris: Secondary | ICD-10-CM | POA: Diagnosis not present

## 2018-08-25 DIAGNOSIS — I10 Essential (primary) hypertension: Secondary | ICD-10-CM | POA: Diagnosis not present

## 2018-08-26 DIAGNOSIS — I69354 Hemiplegia and hemiparesis following cerebral infarction affecting left non-dominant side: Secondary | ICD-10-CM | POA: Diagnosis not present

## 2018-08-26 DIAGNOSIS — I10 Essential (primary) hypertension: Secondary | ICD-10-CM | POA: Diagnosis not present

## 2018-08-26 DIAGNOSIS — Z794 Long term (current) use of insulin: Secondary | ICD-10-CM | POA: Diagnosis not present

## 2018-08-26 DIAGNOSIS — Z6829 Body mass index (BMI) 29.0-29.9, adult: Secondary | ICD-10-CM | POA: Diagnosis not present

## 2018-08-26 DIAGNOSIS — M1991 Primary osteoarthritis, unspecified site: Secondary | ICD-10-CM | POA: Diagnosis not present

## 2018-08-26 DIAGNOSIS — Z951 Presence of aortocoronary bypass graft: Secondary | ICD-10-CM | POA: Diagnosis not present

## 2018-08-26 DIAGNOSIS — Z8546 Personal history of malignant neoplasm of prostate: Secondary | ICD-10-CM | POA: Diagnosis not present

## 2018-08-26 DIAGNOSIS — Z7901 Long term (current) use of anticoagulants: Secondary | ICD-10-CM | POA: Diagnosis not present

## 2018-08-26 DIAGNOSIS — I252 Old myocardial infarction: Secondary | ICD-10-CM | POA: Diagnosis not present

## 2018-08-26 DIAGNOSIS — Z7982 Long term (current) use of aspirin: Secondary | ICD-10-CM | POA: Diagnosis not present

## 2018-08-26 DIAGNOSIS — E1142 Type 2 diabetes mellitus with diabetic polyneuropathy: Secondary | ICD-10-CM | POA: Diagnosis not present

## 2018-08-26 DIAGNOSIS — I480 Paroxysmal atrial fibrillation: Secondary | ICD-10-CM | POA: Diagnosis not present

## 2018-08-26 DIAGNOSIS — E669 Obesity, unspecified: Secondary | ICD-10-CM | POA: Diagnosis not present

## 2018-08-26 DIAGNOSIS — Z87891 Personal history of nicotine dependence: Secondary | ICD-10-CM | POA: Diagnosis not present

## 2018-08-26 DIAGNOSIS — I2510 Atherosclerotic heart disease of native coronary artery without angina pectoris: Secondary | ICD-10-CM | POA: Diagnosis not present

## 2018-08-26 DIAGNOSIS — Z9181 History of falling: Secondary | ICD-10-CM | POA: Diagnosis not present

## 2018-08-28 DIAGNOSIS — M1991 Primary osteoarthritis, unspecified site: Secondary | ICD-10-CM | POA: Diagnosis not present

## 2018-08-28 DIAGNOSIS — E1142 Type 2 diabetes mellitus with diabetic polyneuropathy: Secondary | ICD-10-CM | POA: Diagnosis not present

## 2018-08-28 DIAGNOSIS — I480 Paroxysmal atrial fibrillation: Secondary | ICD-10-CM | POA: Diagnosis not present

## 2018-08-28 DIAGNOSIS — I10 Essential (primary) hypertension: Secondary | ICD-10-CM | POA: Diagnosis not present

## 2018-08-28 DIAGNOSIS — I2510 Atherosclerotic heart disease of native coronary artery without angina pectoris: Secondary | ICD-10-CM | POA: Diagnosis not present

## 2018-08-28 DIAGNOSIS — I69354 Hemiplegia and hemiparesis following cerebral infarction affecting left non-dominant side: Secondary | ICD-10-CM | POA: Diagnosis not present

## 2018-08-31 ENCOUNTER — Telehealth: Payer: Self-pay

## 2018-08-31 NOTE — Telephone Encounter (Signed)
Patient assistance form for Eliquis completed and faxed on 08/06/2018.  Received fax from Patient assistance stated form had been completed.  Received fax on 08/28/2018 stating patient has been APPROVED for patient assistance.   Copy of approval letter placed in bin to be scanned to patients chart.  Left detailed message informing patient that he has been approved for patient assistance for his Eliquis medication. Advised patient to contact office with any questions or concerns.

## 2018-09-01 DIAGNOSIS — E1142 Type 2 diabetes mellitus with diabetic polyneuropathy: Secondary | ICD-10-CM | POA: Diagnosis not present

## 2018-09-01 DIAGNOSIS — I480 Paroxysmal atrial fibrillation: Secondary | ICD-10-CM | POA: Diagnosis not present

## 2018-09-01 DIAGNOSIS — M1991 Primary osteoarthritis, unspecified site: Secondary | ICD-10-CM | POA: Diagnosis not present

## 2018-09-01 DIAGNOSIS — I69354 Hemiplegia and hemiparesis following cerebral infarction affecting left non-dominant side: Secondary | ICD-10-CM | POA: Diagnosis not present

## 2018-09-01 DIAGNOSIS — I2510 Atherosclerotic heart disease of native coronary artery without angina pectoris: Secondary | ICD-10-CM | POA: Diagnosis not present

## 2018-09-01 DIAGNOSIS — I10 Essential (primary) hypertension: Secondary | ICD-10-CM | POA: Diagnosis not present

## 2018-09-02 ENCOUNTER — Encounter: Payer: Self-pay | Admitting: Adult Health

## 2018-09-02 ENCOUNTER — Ambulatory Visit (INDEPENDENT_AMBULATORY_CARE_PROVIDER_SITE_OTHER): Payer: Medicare Other | Admitting: Adult Health

## 2018-09-02 VITALS — BP 149/69 | HR 54 | Ht 72.0 in

## 2018-09-02 DIAGNOSIS — R29898 Other symptoms and signs involving the musculoskeletal system: Secondary | ICD-10-CM

## 2018-09-02 DIAGNOSIS — I10 Essential (primary) hypertension: Secondary | ICD-10-CM | POA: Diagnosis not present

## 2018-09-02 DIAGNOSIS — Z794 Long term (current) use of insulin: Secondary | ICD-10-CM

## 2018-09-02 DIAGNOSIS — I6350 Cerebral infarction due to unspecified occlusion or stenosis of unspecified cerebral artery: Secondary | ICD-10-CM

## 2018-09-02 DIAGNOSIS — I480 Paroxysmal atrial fibrillation: Secondary | ICD-10-CM

## 2018-09-02 DIAGNOSIS — E1142 Type 2 diabetes mellitus with diabetic polyneuropathy: Secondary | ICD-10-CM

## 2018-09-02 DIAGNOSIS — E785 Hyperlipidemia, unspecified: Secondary | ICD-10-CM

## 2018-09-02 NOTE — Progress Notes (Signed)
[facility] [address] [place]. [place] [address] [phone]       OFFICE FOLLOW UP NOTE  Mr. Andre Wolfe Date of Birth:  December 19, 1934 Medical Record Number:  [id number]   Reason for Referral:  hospital stroke follow up  CHIEF COMPLAINT:  Chief Complaint  Patient presents with  . Follow-up    Hospital Stroke follow up room 9 pt with daughter [name] and son Shanon Brow  Pts cardiologist prescribed repatha in December pt has not started it yet, pt is sketchy about starting the repatha medication, They stated cardiologist is aware    HPI: Andre Wolfe is being seen today for initial visit in the office for right pontine and punctate left parietal infarcts embolic pattern felt to be secondary to PAF on 07/13/2018. History obtained from patient, son, daughter and chart review. Reviewed all radiology images and labs personally.  Mr. Andre Wolfe is a 84 y.o. male with history of post-op AF with AC stopped in Sept, HTN, HLD, DB, PN, tobacco use, CAD s/p CABG and GERD  who presented with deconditioning and L HP.  CT had reviewed and showed right pontine infarct age indeterminate, remote left occipital infarct and small vessel disease with atrophy and arthrosclerosis.  MRI head reviewed and showed right paramedian pontine infarct, left parietal cortex infarct and small vessel disease with atrophy.  MRA negative for LVO but did show arthrosclerosis with mild to moderate stenosis.  Carotid Doppler showed bilateral ICA 1 to 39% stenosis and VAs antegrade and greater than 50% ECA stenosis.  2D echo showed an EF of 60 to 65% without cardiac source of embolus identified.  Infarct embolic pattern felt secondary to possible AF.  He does have history of AF postop and per guidelines, AC not recommended for transient postop AF.  Stroke team confirmed with echo tech that AF was not seen during echo.  After consult with cardiology, recommending TEE with possible loop recorder if negative to assess for  atrial fibrillation.  Prior to TEE, patient was found to be in A. fib with RVR on telemetry monitor and ultimately converted back to NSR.  Due to this finding, TEE/loop canceled and Eliquis initiated.  HTN stable during admission recommended long-term BP goal normotensive range.  LDL 153 and Repatha has been prescribed but has not been started at this time and encouraged to start ASAP.  A1c 8.9 recommended tight glycemic control with close PCP follow-up.  Other stroke risk factors include advanced age, current tobacco use, obesity and CAD s/p CABG. he was discharged to [facility] for continuation of therapies.  Mr. Andre Wolfe is being seen today for hospital follow-up and is accompanied by his son and daughter.  He has been stable from a stroke standpoint with residual deficits mild left hemiparesis but overall has been improving.  He continues participating in home PT/OT.  He typically uses his rolling walker at home but will use wheelchair for long distance due to balance difficulty.  Daughter states that he will have "good days and bad days" where he is able to use his rolling walker without any difficulty but then some days he can only use his wheelchair.  He does have ongoing history of neuropathy along with bilateral lower extremities intermittently giving out causing falls.  Because of this, he is fearful of leaving the home or any type of long ambulation/activity due to fear of falling.  He was seen by providers previously while he lived in [place] but states he never received  any answers and was told it was likely due to s/p CABG.  He becomes tearful speaking of this as a greatly limits his mobility and activity.  He does continue on gabapentin 600 mg nightly for neuropathic pain and RLS.  He does endorse nighttime snoring, occasional insomnia and daytime fatigue.  He has never underwent sleep study previously but son has history of OSA with use of CPAP machine.  He continues to live at home with his wife who assist him  with bathing and dressing due to fear of falling.  He continues on Eliquis without side effects of bleeding or bruising.  He has not initiated Repatha at this time as he is questioning whether medication is truly needed and wanted our opinion prior to initiating.  Blood pressure today 149/69.  Denies new or worsening stroke/TIA symptoms.   ROS:   14 system review of systems performed and negative with exception of joint pain, snoring, restless legs and walking difficulty  PMH:  Past Medical History:  Diagnosis Date  . Arrhythmia    post op Afib.  . Arthritis    "joints" (07/13/2018)  . Coronary artery disease    a. s/p CABG 12/2015.  Marland Kitchen CVA (cerebral vascular accident) ([place]) 07/13/2018   right pontine infarct; "said he's had one in the past; probably 2 yrs ago; same symptoms" (07/13/2018)  . Diabetic peripheral neuropathy ([place])   . GERD (gastroesophageal reflux disease)   . Hyperlipidemia   . Hypertension   . Postoperative atrial fibrillation ([place])   . Prostate cancer ([place])    d'x 2015/2016  . TIA (transient ischemic attack)   . Type II diabetes mellitus ([facility])     PSH:  Past Surgical History:  Procedure Laterality Date  . CARDIAC CATHETERIZATION  2017   "not completed; went straight to [facility]"  . CATARACT EXTRACTION W/ INTRAOCULAR LENS  IMPLANT, BILATERAL Bilateral   . CORONARY ARTERY BYPASS GRAFT  2017   "CABG X 4"  . TONSILLECTOMY      Social History:  Social History   Socioeconomic History  . Marital status: Married    Spouse name: Not on file  . Number of children: 2  . Years of education: Not on file  . Highest education level: Not on file  Occupational History  . Occupation: farmer  Social Needs  . Financial resource strain: Not on file  . Food insecurity:    Worry: Not on file    Inability: Not on file  . Transportation needs:    Medical: Not on file    Non-medical: Not on file  Tobacco Use  . Smoking status: Former Smoker    Packs/day: 0.12    Years:  65.00    Pack years: 7.80    Last attempt to quit: 07/08/2018    Years since quitting: 0.1  . Smokeless tobacco: Never Used  Substance and Sexual Activity  . Alcohol use: Not Currently  . Drug use: Never  . Sexual activity: Not on file  Lifestyle  . Physical activity:    Days per week: Not on file    Minutes per session: Not on file  . Stress: Not on file  Relationships  . Social connections:    Talks on phone: Not on file    Gets together: Not on file    Attends religious service: Not on file    Active member of club or organization: Not on file    Attends meetings of clubs or organizations: Not on file  Relationship status: Not on file  . Intimate partner violence:    Fear of current or ex partner: Not on file    Emotionally abused: Not on file    Physically abused: Not on file    Forced sexual activity: Not on file  Other Topics Concern  . Not on file  Social History Narrative  . Not on file    Family History:  Family History  Problem Relation Age of Onset  . Heart disease Mother   . Breast cancer Mother   . Leukemia Father   . Diabetes Brother   . Breast cancer Sister   . Stroke Neg Hx     Medications:   Current Outpatient Medications on File Prior to Visit  Medication Sig Dispense Refill  . acetaminophen (TYLENOL) 325 MG tablet Take 1-2 tablets (325-650 mg total) by mouth every 4 (four) hours as needed for mild pain.    Marland Kitchen amLODipine (NORVASC) 2.5 MG tablet Take 1 tablet (2.5 mg total) by mouth daily. 30 tablet 0  . amoxicillin (AMOXIL) 875 MG tablet Take 875 mg by mouth 2 (two) times daily. Twice a day for UTI    . apixaban (ELIQUIS) 5 MG TABS tablet Take 1 tablet (5 mg total) by mouth 2 (two) times daily. 180 tablet 1  . Evolocumab (REPATHA SURECLICK) 683 MG/ML SOAJ Inject 140 mg into the skin every 14 (fourteen) days. 2 pen 11  . gabapentin (NEURONTIN) 600 MG tablet Take 600 mg by mouth at bedtime.   1  . Insulin Isophane & Regular Human (NOVOLIN 70/30  FLEXPEN) (70-30) 100 UNIT/ML PEN Inject 5-25 Units into the skin 3 (three) times daily. Taking 25 units in the Am and 5 units Midday, and 25 units before dinner.    . metoprolol tartrate (LOPRESSOR) 25 MG tablet TAKE 1/2 (ONE-HALF) TABLET BY MOUTH TWICE DAILY 90 tablet 3  . omeprazole (PRILOSEC) 20 MG capsule Take 20 mg by mouth daily.     No current facility-administered medications on file prior to visit.     Allergies:   Allergies  Allergen Reactions  . Crestor [Rosuvastatin Calcium]   . Lipitor [Atorvastatin Calcium]     Muscle weakness and arm pain  . Zetia [Ezetimibe]   . Zocor [Simvastatin]      Physical Exam  Vitals:   09/02/18 1243  BP: (!) 149/69  Pulse: (!) 54  Height: 6' (1.829 m)   Body mass index is 28.35 kg/m. No exam data present  Depression screen [facility] 2/9 08/05/2018  Decreased Interest 1  Down, Depressed, Hopeless 1  PHQ - 2 Score 2  Altered sleeping 1  Tired, decreased energy 1  Change in appetite 0  Trouble concentrating 0  Moving slowly or fidgety/restless 0  Suicidal thoughts 0  PHQ-9 Score 4  Difficult doing work/chores Not difficult at all    General: well developed, well nourished, pleasant elderly Caucasian male, seated, in no evident distress Head: head normocephalic and atraumatic.   Neck: supple with no carotid or supraclavicular bruits Cardiovascular: regular rate and rhythm, no murmurs Musculoskeletal: no deformity Skin:  no rash/petichiae Vascular:  Normal pulses all extremities  Neurologic Exam Mental Status: Awake and fully alert. Oriented to place and time. Recent and remote memory intact. Attention span, concentration and fund of knowledge appropriate. Mood and affect appropriate.  Cranial Nerves: Fundoscopic exam reveals sharp disc margins. Pupils equal, briskly reactive to light. Extraocular movements full without nystagmus. Visual fields full to confrontation. Hearing intact. Facial sensation intact.  Face, tongue, palate moves  normally and symmetrically.  Motor: Normal bulk and tone. Normal strength in all tested extremity muscles.  Unable to appreciate any weakness during exam Sensory.:  Diminished vibratory and pinprick sensation in bilateral lower extremities equally up to knee level Coordination: Rapid alternating movements normal in all extremities. Finger-to-nose and heel-to-shin performed accurately bilaterally.  Orbits right arm over left arm.  Decreased left hand finger dexterity. Gait and Station: Currently sitting in wheelchair and as he does not have rolling walker present at visit, gait assessment deferred Reflexes: 1+ and symmetric. Toes downgoing.    NIHSS  0 Modified Rankin  2 CHA2DS2-VASc 7 HAS-BLED 2   Diagnostic Data (Labs, Imaging, Testing)  CT HEAD WO CONTRAST 07/13/2018 IMPRESSION: Right pontine infarct is age indeterminate. Left occipital lobe infarct appears more remote but also can not be definitively characterized. Atrophy and chronic microvascular ischemic change. Atherosclerosis.  MR BRAIN WO CONTRAST MR MRA HEAD  07/13/2018 IMPRESSION: MRI head: 1. Right paramedian pons acute/early subacute infarction. Additional punctate infarction in left parietal cortex. No hemorrhage or mass effect. 2. Mild for age chronic microvascular ischemic changes and volume loss of the brain. MRA head: 1. No large vessel occlusion or aneurysm identified. 2. Intracranial atherosclerosis with multiple segments of mild-to-moderate stenosis in the anterior and posterior circulation.  ECHOCARDIOGRAM 07/14/2018 Study Conclusions - Left ventricle: The cavity size was normal. Wall thickness was   normal. Systolic function was normal. The estimated ejection   fraction was in the range of 60% to 65%. Wall motion was normal;   there were no regional wall motion abnormalities. Features are   consistent with a pseudonormal left ventricular filling pattern,   with concomitant abnormal relaxation and  increased filling   pressure (grade 2 diastolic dysfunction). Acoustic contrast   opacification revealed no evidence ofthrombus. - Ventricular septum: Septal motion showed paradox. - Mitral valve: Calcified annulus. Valve area by pressure   half-time: 2.32 cm^2. - Left atrium: The atrium was mildly dilated.    ASSESSMENT: Andre Wolfe is a 84 y.o. year old male here with right pontine and punctate left parietal infarcts embolic pattern on 16/07/930 secondary to PAF. Vascular risk factors include postop AF with discontinue of AC, recurrent PAF during admission, HTN, HLD, DM, tobacco use, and CAD s/p CABG. he is being seen today for hospital follow-up with residual decreased left hand dexterity but otherwise stable from a stroke standpoint.    PLAN:  1. Right pontine and left parietal infarcts: Continue Eliquis (apixaban) daily  and Repatha for secondary stroke prevention. Maintain strict control of hypertension with blood pressure goal below 130/90, diabetes with hemoglobin A1c goal below 6.5% and cholesterol with LDL cholesterol (bad cholesterol) goal below 70 mg/dL.  I also advised the patient to eat a healthy diet with plenty of whole grains, cereals, fruits and vegetables, exercise regularly with at least 30 minutes of continuous activity daily and maintain ideal body weight.  Continuation of home PT/OT 2. PAF on AC: Continued follow-up with cardiology for ongoing monitoring and Eliquis management 3. HTN: Advised to continue current treatment regimen.  Today's BP 149/69.  Advised to continue to monitor at home along with continued follow-up with PCP for management 4. HLD: Advised to continue current treatment regimen along with continued follow-up with PCP for future prescribing and monitoring of lipid panel 5. DMII: Advised to continue to monitor glucose levels at home along with continued follow-up with PCP for management and monitoring 6. Intermittent leg weakness: Recommend EMG/NCV  to  assess for underlying conditions which could be causing his legs to give out randomly.  Advised him depending on results, we will pursue further work-up if needed and indicated 7. Likely OSA: Patient hesitant on OSA testing due to fear of being intolerable of mask.  Highly encouraged undergoing study due to multiple symptoms and family history of OSA along with educating on importance of OSA treatment due to multiple risk factors increasing chance of cardiac and neurological conditions    Follow up in 3 months or call earlier if needed   Greater than 50% of time during this 25 minute visit was spent on counseling, explanation of diagnosis of embolic infarcts, reviewing risk factor management of PAF on AC, HTN, HLD and DM, planning of further management along with potential future management, and discussion with patient and family answering all questions.    Venancio Poisson, AGNP-BC  [facility] [address] [place], [place] [address]  Phone [phone] Fax [phone] Note: This document was prepared with digital dictation and possible smart phrase technology. Any transcriptional errors that result from this process are unintentional.

## 2018-09-02 NOTE — Patient Instructions (Signed)
Continue Eliquis (apixaban) daily  and start Repatha  for secondary stroke prevention  Continue to follow up with PCP regarding cholesterol, blood pressure and diabetes management   Consider EMG/nerve conduction study to assess for underlying reasons of intermittent leg weakness  Continue therapy at home   Continue to monitor blood pressure at home  Maintain strict control of hypertension with blood pressure goal below 130/90, diabetes with hemoglobin A1c goal below 6.5% and cholesterol with LDL cholesterol (bad cholesterol) goal below 70 mg/dL. I also advised the patient to eat a healthy diet with plenty of whole grains, cereals, fruits and vegetables, exercise regularly and maintain ideal body weight.  Followup in the future with me in 3 months or call earlier if needed       Thank you for coming to see [place] at [facility]. I hope we have been able to provide you high quality care today.  You may receive a patient satisfaction survey over the next few weeks. We would appreciate your feedback and comments so that we may continue to improve ourselves and the health of our patients.

## 2018-09-03 DIAGNOSIS — I2510 Atherosclerotic heart disease of native coronary artery without angina pectoris: Secondary | ICD-10-CM | POA: Diagnosis not present

## 2018-09-03 DIAGNOSIS — M1991 Primary osteoarthritis, unspecified site: Secondary | ICD-10-CM | POA: Diagnosis not present

## 2018-09-03 DIAGNOSIS — I10 Essential (primary) hypertension: Secondary | ICD-10-CM | POA: Diagnosis not present

## 2018-09-03 DIAGNOSIS — I480 Paroxysmal atrial fibrillation: Secondary | ICD-10-CM | POA: Diagnosis not present

## 2018-09-03 DIAGNOSIS — E1142 Type 2 diabetes mellitus with diabetic polyneuropathy: Secondary | ICD-10-CM | POA: Diagnosis not present

## 2018-09-03 DIAGNOSIS — I69354 Hemiplegia and hemiparesis following cerebral infarction affecting left non-dominant side: Secondary | ICD-10-CM | POA: Diagnosis not present

## 2018-09-04 DIAGNOSIS — I480 Paroxysmal atrial fibrillation: Secondary | ICD-10-CM | POA: Diagnosis not present

## 2018-09-04 DIAGNOSIS — M1991 Primary osteoarthritis, unspecified site: Secondary | ICD-10-CM | POA: Diagnosis not present

## 2018-09-04 DIAGNOSIS — I2510 Atherosclerotic heart disease of native coronary artery without angina pectoris: Secondary | ICD-10-CM | POA: Diagnosis not present

## 2018-09-04 DIAGNOSIS — I10 Essential (primary) hypertension: Secondary | ICD-10-CM | POA: Diagnosis not present

## 2018-09-04 DIAGNOSIS — I69354 Hemiplegia and hemiparesis following cerebral infarction affecting left non-dominant side: Secondary | ICD-10-CM | POA: Diagnosis not present

## 2018-09-04 DIAGNOSIS — E1142 Type 2 diabetes mellitus with diabetic polyneuropathy: Secondary | ICD-10-CM | POA: Diagnosis not present

## 2018-09-04 NOTE — Progress Notes (Signed)
I agree with the above plan 

## 2018-09-08 DIAGNOSIS — E1142 Type 2 diabetes mellitus with diabetic polyneuropathy: Secondary | ICD-10-CM | POA: Diagnosis not present

## 2018-09-08 DIAGNOSIS — I69354 Hemiplegia and hemiparesis following cerebral infarction affecting left non-dominant side: Secondary | ICD-10-CM | POA: Diagnosis not present

## 2018-09-08 DIAGNOSIS — M1991 Primary osteoarthritis, unspecified site: Secondary | ICD-10-CM | POA: Diagnosis not present

## 2018-09-08 DIAGNOSIS — I480 Paroxysmal atrial fibrillation: Secondary | ICD-10-CM | POA: Diagnosis not present

## 2018-09-08 DIAGNOSIS — I10 Essential (primary) hypertension: Secondary | ICD-10-CM | POA: Diagnosis not present

## 2018-09-08 DIAGNOSIS — I2510 Atherosclerotic heart disease of native coronary artery without angina pectoris: Secondary | ICD-10-CM | POA: Diagnosis not present

## 2018-09-09 DIAGNOSIS — R338 Other retention of urine: Secondary | ICD-10-CM | POA: Diagnosis not present

## 2018-09-09 DIAGNOSIS — R3915 Urgency of urination: Secondary | ICD-10-CM | POA: Diagnosis not present

## 2018-09-09 DIAGNOSIS — N3941 Urge incontinence: Secondary | ICD-10-CM | POA: Diagnosis not present

## 2018-09-09 DIAGNOSIS — Z8546 Personal history of malignant neoplasm of prostate: Secondary | ICD-10-CM | POA: Diagnosis not present

## 2018-09-10 DIAGNOSIS — I10 Essential (primary) hypertension: Secondary | ICD-10-CM | POA: Diagnosis not present

## 2018-09-10 DIAGNOSIS — E1142 Type 2 diabetes mellitus with diabetic polyneuropathy: Secondary | ICD-10-CM | POA: Diagnosis not present

## 2018-09-10 DIAGNOSIS — I2510 Atherosclerotic heart disease of native coronary artery without angina pectoris: Secondary | ICD-10-CM | POA: Diagnosis not present

## 2018-09-10 DIAGNOSIS — I480 Paroxysmal atrial fibrillation: Secondary | ICD-10-CM | POA: Diagnosis not present

## 2018-09-10 DIAGNOSIS — I69354 Hemiplegia and hemiparesis following cerebral infarction affecting left non-dominant side: Secondary | ICD-10-CM | POA: Diagnosis not present

## 2018-09-10 DIAGNOSIS — M1991 Primary osteoarthritis, unspecified site: Secondary | ICD-10-CM | POA: Diagnosis not present

## 2018-09-11 DIAGNOSIS — I2510 Atherosclerotic heart disease of native coronary artery without angina pectoris: Secondary | ICD-10-CM | POA: Diagnosis not present

## 2018-09-11 DIAGNOSIS — I480 Paroxysmal atrial fibrillation: Secondary | ICD-10-CM | POA: Diagnosis not present

## 2018-09-11 DIAGNOSIS — I10 Essential (primary) hypertension: Secondary | ICD-10-CM | POA: Diagnosis not present

## 2018-09-11 DIAGNOSIS — M1991 Primary osteoarthritis, unspecified site: Secondary | ICD-10-CM | POA: Diagnosis not present

## 2018-09-11 DIAGNOSIS — E1142 Type 2 diabetes mellitus with diabetic polyneuropathy: Secondary | ICD-10-CM | POA: Diagnosis not present

## 2018-09-11 DIAGNOSIS — I69354 Hemiplegia and hemiparesis following cerebral infarction affecting left non-dominant side: Secondary | ICD-10-CM | POA: Diagnosis not present

## 2018-09-15 ENCOUNTER — Ambulatory Visit: Payer: Medicare Other | Admitting: Physical Medicine & Rehabilitation

## 2018-09-15 DIAGNOSIS — I2510 Atherosclerotic heart disease of native coronary artery without angina pectoris: Secondary | ICD-10-CM | POA: Diagnosis not present

## 2018-09-15 DIAGNOSIS — I69354 Hemiplegia and hemiparesis following cerebral infarction affecting left non-dominant side: Secondary | ICD-10-CM | POA: Diagnosis not present

## 2018-09-15 DIAGNOSIS — R338 Other retention of urine: Secondary | ICD-10-CM | POA: Diagnosis not present

## 2018-09-15 DIAGNOSIS — I10 Essential (primary) hypertension: Secondary | ICD-10-CM | POA: Diagnosis not present

## 2018-09-15 DIAGNOSIS — Z8546 Personal history of malignant neoplasm of prostate: Secondary | ICD-10-CM | POA: Diagnosis not present

## 2018-09-15 DIAGNOSIS — R310 Gross hematuria: Secondary | ICD-10-CM | POA: Diagnosis not present

## 2018-09-15 DIAGNOSIS — E1142 Type 2 diabetes mellitus with diabetic polyneuropathy: Secondary | ICD-10-CM | POA: Diagnosis not present

## 2018-09-15 DIAGNOSIS — I480 Paroxysmal atrial fibrillation: Secondary | ICD-10-CM | POA: Diagnosis not present

## 2018-09-15 DIAGNOSIS — M1991 Primary osteoarthritis, unspecified site: Secondary | ICD-10-CM | POA: Diagnosis not present

## 2018-09-17 DIAGNOSIS — I2510 Atherosclerotic heart disease of native coronary artery without angina pectoris: Secondary | ICD-10-CM | POA: Diagnosis not present

## 2018-09-17 DIAGNOSIS — E1142 Type 2 diabetes mellitus with diabetic polyneuropathy: Secondary | ICD-10-CM | POA: Diagnosis not present

## 2018-09-17 DIAGNOSIS — I10 Essential (primary) hypertension: Secondary | ICD-10-CM | POA: Diagnosis not present

## 2018-09-17 DIAGNOSIS — I480 Paroxysmal atrial fibrillation: Secondary | ICD-10-CM | POA: Diagnosis not present

## 2018-09-17 DIAGNOSIS — G8194 Hemiplegia, unspecified affecting left nondominant side: Secondary | ICD-10-CM | POA: Diagnosis not present

## 2018-09-17 DIAGNOSIS — Z7901 Long term (current) use of anticoagulants: Secondary | ICD-10-CM | POA: Diagnosis not present

## 2018-09-17 DIAGNOSIS — I6350 Cerebral infarction due to unspecified occlusion or stenosis of unspecified cerebral artery: Secondary | ICD-10-CM | POA: Diagnosis not present

## 2018-09-17 DIAGNOSIS — Z5181 Encounter for therapeutic drug level monitoring: Secondary | ICD-10-CM | POA: Diagnosis not present

## 2018-09-17 DIAGNOSIS — E7800 Pure hypercholesterolemia, unspecified: Secondary | ICD-10-CM | POA: Diagnosis not present

## 2018-09-17 DIAGNOSIS — M1991 Primary osteoarthritis, unspecified site: Secondary | ICD-10-CM | POA: Diagnosis not present

## 2018-09-17 DIAGNOSIS — I69354 Hemiplegia and hemiparesis following cerebral infarction affecting left non-dominant side: Secondary | ICD-10-CM | POA: Diagnosis not present

## 2018-09-18 DIAGNOSIS — I69354 Hemiplegia and hemiparesis following cerebral infarction affecting left non-dominant side: Secondary | ICD-10-CM | POA: Diagnosis not present

## 2018-09-18 DIAGNOSIS — I10 Essential (primary) hypertension: Secondary | ICD-10-CM | POA: Diagnosis not present

## 2018-09-18 DIAGNOSIS — I2510 Atherosclerotic heart disease of native coronary artery without angina pectoris: Secondary | ICD-10-CM | POA: Diagnosis not present

## 2018-09-18 DIAGNOSIS — I480 Paroxysmal atrial fibrillation: Secondary | ICD-10-CM | POA: Diagnosis not present

## 2018-09-18 DIAGNOSIS — E1142 Type 2 diabetes mellitus with diabetic polyneuropathy: Secondary | ICD-10-CM | POA: Diagnosis not present

## 2018-09-18 DIAGNOSIS — M1991 Primary osteoarthritis, unspecified site: Secondary | ICD-10-CM | POA: Diagnosis not present

## 2018-09-21 DIAGNOSIS — R338 Other retention of urine: Secondary | ICD-10-CM | POA: Diagnosis not present

## 2018-09-21 DIAGNOSIS — C61 Malignant neoplasm of prostate: Secondary | ICD-10-CM | POA: Diagnosis not present

## 2018-09-21 DIAGNOSIS — N3941 Urge incontinence: Secondary | ICD-10-CM | POA: Diagnosis not present

## 2018-09-22 DIAGNOSIS — I69354 Hemiplegia and hemiparesis following cerebral infarction affecting left non-dominant side: Secondary | ICD-10-CM | POA: Diagnosis not present

## 2018-09-22 DIAGNOSIS — I480 Paroxysmal atrial fibrillation: Secondary | ICD-10-CM | POA: Diagnosis not present

## 2018-09-22 DIAGNOSIS — M1991 Primary osteoarthritis, unspecified site: Secondary | ICD-10-CM | POA: Diagnosis not present

## 2018-09-22 DIAGNOSIS — E1142 Type 2 diabetes mellitus with diabetic polyneuropathy: Secondary | ICD-10-CM | POA: Diagnosis not present

## 2018-09-22 DIAGNOSIS — I10 Essential (primary) hypertension: Secondary | ICD-10-CM | POA: Diagnosis not present

## 2018-09-22 DIAGNOSIS — I2510 Atherosclerotic heart disease of native coronary artery without angina pectoris: Secondary | ICD-10-CM | POA: Diagnosis not present

## 2018-09-23 DIAGNOSIS — I480 Paroxysmal atrial fibrillation: Secondary | ICD-10-CM | POA: Diagnosis not present

## 2018-09-23 DIAGNOSIS — I69354 Hemiplegia and hemiparesis following cerebral infarction affecting left non-dominant side: Secondary | ICD-10-CM | POA: Diagnosis not present

## 2018-09-23 DIAGNOSIS — E1142 Type 2 diabetes mellitus with diabetic polyneuropathy: Secondary | ICD-10-CM | POA: Diagnosis not present

## 2018-09-23 DIAGNOSIS — M1991 Primary osteoarthritis, unspecified site: Secondary | ICD-10-CM | POA: Diagnosis not present

## 2018-09-23 DIAGNOSIS — I2510 Atherosclerotic heart disease of native coronary artery without angina pectoris: Secondary | ICD-10-CM | POA: Diagnosis not present

## 2018-09-23 DIAGNOSIS — I10 Essential (primary) hypertension: Secondary | ICD-10-CM | POA: Diagnosis not present

## 2018-09-28 DIAGNOSIS — R300 Dysuria: Secondary | ICD-10-CM | POA: Diagnosis not present

## 2018-10-01 DIAGNOSIS — I10 Essential (primary) hypertension: Secondary | ICD-10-CM | POA: Diagnosis not present

## 2018-10-01 DIAGNOSIS — E1165 Type 2 diabetes mellitus with hyperglycemia: Secondary | ICD-10-CM | POA: Diagnosis not present

## 2018-10-01 DIAGNOSIS — G629 Polyneuropathy, unspecified: Secondary | ICD-10-CM | POA: Diagnosis not present

## 2018-10-01 DIAGNOSIS — E7800 Pure hypercholesterolemia, unspecified: Secondary | ICD-10-CM | POA: Diagnosis not present

## 2018-10-06 ENCOUNTER — Ambulatory Visit: Payer: Medicare Other | Admitting: Physical Medicine & Rehabilitation

## 2018-10-09 ENCOUNTER — Encounter: Payer: 59 | Admitting: Neurology

## 2018-10-12 DIAGNOSIS — R338 Other retention of urine: Secondary | ICD-10-CM | POA: Diagnosis not present

## 2018-10-12 DIAGNOSIS — Z8546 Personal history of malignant neoplasm of prostate: Secondary | ICD-10-CM | POA: Diagnosis not present

## 2018-10-12 DIAGNOSIS — R310 Gross hematuria: Secondary | ICD-10-CM | POA: Diagnosis not present

## 2018-10-13 ENCOUNTER — Institutional Professional Consult (permissible substitution): Payer: 59 | Admitting: Neurology

## 2018-10-19 ENCOUNTER — Telehealth: Payer: Self-pay | Admitting: *Deleted

## 2018-10-19 NOTE — Addendum Note (Signed)
Addended by: Venetia Maxon on: 10/19/2018 03:37 PM   Modules accepted: Orders

## 2018-10-19 NOTE — Telephone Encounter (Signed)
   Cardiac Questionnaire:    Since your last visit or hospitalization:    1. Have you been having new or worsening chest pain? NO   2. Have you been having new or worsening shortness of breath? NO 3. Have you been having new or worsening leg swelling, wt gain, or increase in abdominal girth (pants fitting more tightly)? NO   4. Have you had any passing out spells? NO    *A YES to any of these questions would result in the appointment being kept. *If all the answers to these questions are NO, we should indicate that given the current situation regarding the worldwide coronarvirus pandemic, at the recommendation of the CDC, we are looking to limit gatherings in our waiting area, and thus will reschedule their appointment beyond four weeks from today.   _____________   [id number] Pre-Screening Questions:  . Do you currently have a fever? NO . Have you recently travelled on a cruise, internationally, or to [place], [place], [place], [place], [place], or [place], [place] [employer]) ? NO Have you been in contact with someone that is currently pending confirmation of Covid19 testing or has been confirmed to have the [place] virus?  NO Are you currently experiencing fatigue or cough? NO       Patient's daughter [name] said that patient would like to do a telephone visit with Dr. [place] October 22, 2018.

## 2018-10-22 ENCOUNTER — Telehealth: Payer: Medicare Other | Admitting: Cardiology

## 2018-11-05 ENCOUNTER — Encounter (HOSPITAL_COMMUNITY): Payer: Self-pay

## 2018-11-05 ENCOUNTER — Inpatient Hospital Stay (HOSPITAL_COMMUNITY)
Admission: EM | Admit: 2018-11-05 | Discharge: 2018-11-09 | DRG: 065 | Disposition: A | Discharge status: Rehabilitation Facility | Payer: Medicare Other | Attending: Internal Medicine | Admitting: Internal Medicine

## 2018-11-05 ENCOUNTER — Emergency Department (HOSPITAL_COMMUNITY): Payer: Medicare Other

## 2018-11-05 ENCOUNTER — Other Ambulatory Visit: Payer: Self-pay

## 2018-11-05 DIAGNOSIS — Z66 Do not resuscitate: Secondary | ICD-10-CM | POA: Diagnosis present

## 2018-11-05 DIAGNOSIS — I480 Paroxysmal atrial fibrillation: Secondary | ICD-10-CM | POA: Diagnosis present

## 2018-11-05 DIAGNOSIS — Z8546 Personal history of malignant neoplasm of prostate: Secondary | ICD-10-CM | POA: Diagnosis not present

## 2018-11-05 DIAGNOSIS — I2510 Atherosclerotic heart disease of native coronary artery without angina pectoris: Secondary | ICD-10-CM | POA: Diagnosis present

## 2018-11-05 DIAGNOSIS — R29810 Facial weakness: Secondary | ICD-10-CM | POA: Diagnosis present

## 2018-11-05 DIAGNOSIS — E1142 Type 2 diabetes mellitus with diabetic polyneuropathy: Secondary | ICD-10-CM | POA: Diagnosis present

## 2018-11-05 DIAGNOSIS — I639 Cerebral infarction, unspecified: Secondary | ICD-10-CM | POA: Diagnosis not present

## 2018-11-05 DIAGNOSIS — E785 Hyperlipidemia, unspecified: Secondary | ICD-10-CM | POA: Diagnosis present

## 2018-11-05 DIAGNOSIS — G8194 Hemiplegia, unspecified affecting left nondominant side: Secondary | ICD-10-CM | POA: Diagnosis present

## 2018-11-05 DIAGNOSIS — R471 Dysarthria and anarthria: Secondary | ICD-10-CM | POA: Diagnosis present

## 2018-11-05 DIAGNOSIS — R29705 NIHSS score 5: Secondary | ICD-10-CM | POA: Diagnosis present

## 2018-11-05 DIAGNOSIS — E1165 Type 2 diabetes mellitus with hyperglycemia: Secondary | ICD-10-CM | POA: Diagnosis present

## 2018-11-05 DIAGNOSIS — I69354 Hemiplegia and hemiparesis following cerebral infarction affecting left non-dominant side: Secondary | ICD-10-CM | POA: Diagnosis not present

## 2018-11-05 DIAGNOSIS — I6350 Cerebral infarction due to unspecified occlusion or stenosis of unspecified cerebral artery: Secondary | ICD-10-CM | POA: Diagnosis not present

## 2018-11-05 DIAGNOSIS — I110 Hypertensive heart disease with heart failure: Secondary | ICD-10-CM | POA: Diagnosis present

## 2018-11-05 DIAGNOSIS — N179 Acute kidney failure, unspecified: Secondary | ICD-10-CM | POA: Diagnosis not present

## 2018-11-05 DIAGNOSIS — I672 Cerebral atherosclerosis: Secondary | ICD-10-CM | POA: Diagnosis present

## 2018-11-05 DIAGNOSIS — Z951 Presence of aortocoronary bypass graft: Secondary | ICD-10-CM

## 2018-11-05 DIAGNOSIS — I5032 Chronic diastolic (congestive) heart failure: Secondary | ICD-10-CM | POA: Diagnosis present

## 2018-11-05 DIAGNOSIS — Z888 Allergy status to other drugs, medicaments and biological substances status: Secondary | ICD-10-CM

## 2018-11-05 DIAGNOSIS — R29818 Other symptoms and signs involving the nervous system: Secondary | ICD-10-CM | POA: Diagnosis not present

## 2018-11-05 DIAGNOSIS — Z8249 Family history of ischemic heart disease and other diseases of the circulatory system: Secondary | ICD-10-CM | POA: Diagnosis not present

## 2018-11-05 DIAGNOSIS — I10 Essential (primary) hypertension: Secondary | ICD-10-CM | POA: Diagnosis not present

## 2018-11-05 DIAGNOSIS — I6381 Other cerebral infarction due to occlusion or stenosis of small artery: Secondary | ICD-10-CM | POA: Diagnosis present

## 2018-11-05 DIAGNOSIS — R4781 Slurred speech: Secondary | ICD-10-CM | POA: Diagnosis not present

## 2018-11-05 DIAGNOSIS — Z7901 Long term (current) use of anticoagulants: Secondary | ICD-10-CM | POA: Diagnosis not present

## 2018-11-05 DIAGNOSIS — Z87891 Personal history of nicotine dependence: Secondary | ICD-10-CM | POA: Diagnosis not present

## 2018-11-05 DIAGNOSIS — R7309 Other abnormal glucose: Secondary | ICD-10-CM | POA: Diagnosis not present

## 2018-11-05 DIAGNOSIS — R1310 Dysphagia, unspecified: Secondary | ICD-10-CM | POA: Diagnosis present

## 2018-11-05 DIAGNOSIS — I6523 Occlusion and stenosis of bilateral carotid arteries: Secondary | ICD-10-CM | POA: Diagnosis not present

## 2018-11-05 DIAGNOSIS — Z1159 Encounter for screening for other viral diseases: Secondary | ICD-10-CM | POA: Diagnosis not present

## 2018-11-05 DIAGNOSIS — Z794 Long term (current) use of insulin: Secondary | ICD-10-CM

## 2018-11-05 DIAGNOSIS — Z8659 Personal history of other mental and behavioral disorders: Secondary | ICD-10-CM | POA: Diagnosis not present

## 2018-11-05 DIAGNOSIS — R339 Retention of urine, unspecified: Secondary | ICD-10-CM | POA: Diagnosis not present

## 2018-11-05 DIAGNOSIS — E1151 Type 2 diabetes mellitus with diabetic peripheral angiopathy without gangrene: Secondary | ICD-10-CM | POA: Diagnosis present

## 2018-11-05 DIAGNOSIS — R531 Weakness: Secondary | ICD-10-CM

## 2018-11-05 DIAGNOSIS — Z79899 Other long term (current) drug therapy: Secondary | ICD-10-CM

## 2018-11-05 DIAGNOSIS — E7800 Pure hypercholesterolemia, unspecified: Secondary | ICD-10-CM | POA: Diagnosis present

## 2018-11-05 DIAGNOSIS — E162 Hypoglycemia, unspecified: Secondary | ICD-10-CM | POA: Diagnosis not present

## 2018-11-05 DIAGNOSIS — E119 Type 2 diabetes mellitus without complications: Secondary | ICD-10-CM | POA: Diagnosis not present

## 2018-11-05 DIAGNOSIS — K219 Gastro-esophageal reflux disease without esophagitis: Secondary | ICD-10-CM | POA: Diagnosis present

## 2018-11-05 DIAGNOSIS — N319 Neuromuscular dysfunction of bladder, unspecified: Secondary | ICD-10-CM | POA: Diagnosis not present

## 2018-11-05 DIAGNOSIS — R001 Bradycardia, unspecified: Secondary | ICD-10-CM | POA: Diagnosis not present

## 2018-11-05 LAB — CBC
HCT: 46.4 % (ref 39.0–52.0)
Hemoglobin: 15.9 g/dL (ref 13.0–17.0)
MCH: 29.6 pg (ref 26.0–34.0)
MCHC: 34.3 g/dL (ref 30.0–36.0)
MCV: 86.2 fL (ref 80.0–100.0)
Platelets: 211 10*3/uL (ref 150–400)
RBC: 5.38 MIL/uL (ref 4.22–5.81)
RDW: 12.8 % (ref 11.5–15.5)
WBC: 7.4 10*3/uL (ref 4.0–10.5)
nRBC: 0 % (ref 0.0–0.2)

## 2018-11-05 LAB — DIFFERENTIAL
Abs Immature Granulocytes: 0.02 10*3/uL (ref 0.00–0.07)
Basophils Absolute: 0.1 10*3/uL (ref 0.0–0.1)
Basophils Relative: 1 %
Eosinophils Absolute: 0.2 10*3/uL (ref 0.0–0.5)
Eosinophils Relative: 3 %
Immature Granulocytes: 0 %
Lymphocytes Relative: 19 %
Lymphs Abs: 1.4 10*3/uL (ref 0.7–4.0)
Monocytes Absolute: 0.8 10*3/uL (ref 0.1–1.0)
Monocytes Relative: 11 %
Neutro Abs: 4.9 10*3/uL (ref 1.7–7.7)
Neutrophils Relative %: 66 %

## 2018-11-05 LAB — COMPREHENSIVE METABOLIC PANEL
ALT: 25 U/L (ref 0–44)
AST: 25 U/L (ref 15–41)
Albumin: 3.7 g/dL (ref 3.5–5.0)
Alkaline Phosphatase: 52 U/L (ref 38–126)
Anion gap: 10 (ref 5–15)
BUN: 15 mg/dL (ref 8–23)
CO2: 24 mmol/L (ref 22–32)
Calcium: 9.3 mg/dL (ref 8.9–10.3)
Chloride: 103 mmol/L (ref 98–111)
Creatinine, Ser: 1.08 mg/dL (ref 0.61–1.24)
GFR calc Af Amer: >60 mL/min (ref >60)
GFR calc non Af Amer: >60 mL/min (ref >60)
Glucose, Bld: 188 mg/dL — ABNORMAL HIGH (ref 70–99)
Potassium: 4.8 mmol/L (ref 3.5–5.1)
Sodium: 137 mmol/L (ref 135–145)
Total Bilirubin: 1.1 mg/dL (ref 0.3–1.2)
Total Protein: 6.9 g/dL (ref 6.5–8.1)

## 2018-11-05 LAB — I-STAT CREATININE, ED: Creatinine, Ser: 1 mg/dL (ref 0.61–1.24)

## 2018-11-05 LAB — GLUCOSE, CAPILLARY
Glucose-Capillary: 175 mg/dL — ABNORMAL HIGH (ref 70–99)
Glucose-Capillary: 133 mg/dL — ABNORMAL HIGH (ref 70–99)

## 2018-11-05 LAB — PROTIME-INR
INR: 1.3 — ABNORMAL HIGH (ref 0.8–1.2)
Prothrombin Time: 16.1 seconds — ABNORMAL HIGH (ref 11.4–15.2)

## 2018-11-05 LAB — ETHANOL: Alcohol, Ethyl (B): <10 mg/dL (ref <10)

## 2018-11-05 LAB — APTT: aPTT: 33 seconds (ref 24–36)

## 2018-11-05 LAB — HEMOGLOBIN A1C
Hgb A1c MFr Bld: 8 % — ABNORMAL HIGH (ref 4.8–5.6)
Mean Plasma Glucose: 182.9 mg/dL

## 2018-11-05 LAB — CBG MONITORING, ED: Glucose-Capillary: 194 mg/dL — ABNORMAL HIGH (ref 70–99)

## 2018-11-05 MED ORDER — STROKE: EARLY STAGES OF RECOVERY BOOK
Freq: Once | Status: AC
  Administered 2018-11-05: 14:00:00

## 2018-11-05 MED ORDER — IOHEXOL 350 MG/ML SOLN
100.0000 mL | Freq: Once | INTRAVENOUS | Status: AC | PRN
  Administered 2018-11-05: 08:00:00 60 mL via INTRAVENOUS

## 2018-11-05 MED ORDER — ASPIRIN 300 MG RE SUPP: 300.0000 mg | Freq: Every day | RECTAL | Status: DC

## 2018-11-05 MED ORDER — ASPIRIN 325 MG PO TABS
325.0000 mg | ORAL_TABLET | Freq: Every day | ORAL | Status: DC
  Administered 2018-11-05: 14:00:00 325 mg via ORAL
  Filled 2018-11-05: qty 1

## 2018-11-05 MED ORDER — INSULIN ASPART 100 UNIT/ML ~~LOC~~ SOLN
0.0000 [IU] | Freq: Three times a day (TID) | SUBCUTANEOUS | Status: DC
  Administered 2018-11-05: 3 [IU] via SUBCUTANEOUS
  Administered 2018-11-06: 09:00:00 2 [IU] via SUBCUTANEOUS
  Administered 2018-11-06: 12:00:00 5 [IU] via SUBCUTANEOUS
  Administered 2018-11-07: 3 [IU] via SUBCUTANEOUS
  Administered 2018-11-07: 8 [IU] via SUBCUTANEOUS
  Administered 2018-11-08: 12:00:00 3 [IU] via SUBCUTANEOUS
  Administered 2018-11-08: 2 [IU] via SUBCUTANEOUS
  Administered 2018-11-09: 14:00:00 8 [IU] via SUBCUTANEOUS
  Administered 2018-11-09: 2 [IU] via SUBCUTANEOUS

## 2018-11-05 MED ORDER — ACETAMINOPHEN 325 MG PO TABS: 650.0000 mg | ORAL_TABLET | ORAL | Status: DC | PRN

## 2018-11-05 MED ORDER — SODIUM CHLORIDE 0.9 % IV SOLN
INTRAVENOUS | Status: DC
  Administered 2018-11-05: 14:00:00 via INTRAVENOUS

## 2018-11-05 MED ORDER — PANTOPRAZOLE SODIUM 40 MG PO TBEC
40.0000 mg | DELAYED_RELEASE_TABLET | Freq: Every day | ORAL | Status: DC
  Administered 2018-11-05 – 2018-11-09 (×5): 40 mg via ORAL
  Filled 2018-11-05 (×5): qty 1

## 2018-11-05 MED ORDER — ACETAMINOPHEN 650 MG RE SUPP: 650.0000 mg | RECTAL | Status: DC | PRN

## 2018-11-05 MED ORDER — GABAPENTIN 600 MG PO TABS
600.0000 mg | ORAL_TABLET | Freq: Every day | ORAL | Status: DC
  Administered 2018-11-05 – 2018-11-08 (×4): 600 mg via ORAL
  Filled 2018-11-05 (×4): qty 1

## 2018-11-05 MED ORDER — METOPROLOL TARTRATE 12.5 MG HALF TABLET
12.5000 mg | ORAL_TABLET | Freq: Two times a day (BID) | ORAL | Status: DC
  Administered 2018-11-05 – 2018-11-09 (×8): 12.5 mg via ORAL
  Filled 2018-11-05 (×8): qty 1

## 2018-11-05 MED ORDER — ENSURE ENLIVE PO LIQD
237.0000 mL | Freq: Two times a day (BID) | ORAL | Status: DC
  Administered 2018-11-05 – 2018-11-09 (×4): 237 mL via ORAL

## 2018-11-05 MED ORDER — SENNOSIDES-DOCUSATE SODIUM 8.6-50 MG PO TABS: 1.0000 | ORAL_TABLET | Freq: Every evening | ORAL | Status: DC | PRN

## 2018-11-05 MED ORDER — ASPIRIN EC 81 MG PO TBEC
81.0000 mg | DELAYED_RELEASE_TABLET | Freq: Every day | ORAL | Status: DC
  Administered 2018-11-06 – 2018-11-09 (×4): 81 mg via ORAL
  Filled 2018-11-05 (×4): qty 1

## 2018-11-05 MED ORDER — APIXABAN 5 MG PO TABS
5.0000 mg | ORAL_TABLET | Freq: Two times a day (BID) | ORAL | Status: DC
  Administered 2018-11-05 – 2018-11-09 (×9): 5 mg via ORAL
  Filled 2018-11-05 (×9): qty 1

## 2018-11-05 MED ORDER — INSULIN ASPART PROT & ASPART (70-30 MIX) 100 UNIT/ML ~~LOC~~ SUSP
30.0000 [IU] | Freq: Every day | SUBCUTANEOUS | Status: DC
  Administered 2018-11-06 – 2018-11-09 (×4): 30 [IU] via SUBCUTANEOUS
  Filled 2018-11-05: qty 10

## 2018-11-05 MED ORDER — INSULIN ASPART 100 UNIT/ML ~~LOC~~ SOLN
0.0000 [IU] | Freq: Every day | SUBCUTANEOUS | Status: DC
  Administered 2018-11-07: 22:00:00 2 [IU] via SUBCUTANEOUS

## 2018-11-05 MED ORDER — INSULIN ISOPHANE & REGULAR (HUMAN 70-30)100 UNIT/ML KWIKPEN: 10.0000 [IU] | PEN_INJECTOR | SUBCUTANEOUS | Status: DC

## 2018-11-05 MED ORDER — INSULIN ASPART PROT & ASPART (70-30 MIX) 100 UNIT/ML ~~LOC~~ SUSP
10.0000 [IU] | Freq: Every day | SUBCUTANEOUS | Status: DC
  Administered 2018-11-06 – 2018-11-09 (×4): 10 [IU] via SUBCUTANEOUS
  Filled 2018-11-05: qty 10

## 2018-11-05 MED ORDER — ACETAMINOPHEN 160 MG/5ML PO SOLN: 650.0000 mg | ORAL | Status: DC | PRN

## 2018-11-05 MED ORDER — INSULIN ASPART PROT & ASPART (70-30 MIX) 100 UNIT/ML ~~LOC~~ SUSP
20.0000 [IU] | Freq: Every day | SUBCUTANEOUS | Status: DC
  Administered 2018-11-05 – 2018-11-08 (×4): 20 [IU] via SUBCUTANEOUS
  Filled 2018-11-05: qty 10

## 2018-11-05 MED ORDER — AMLODIPINE BESYLATE 2.5 MG PO TABS
2.5000 mg | ORAL_TABLET | Freq: Every day | ORAL | Status: DC
  Administered 2018-11-05 – 2018-11-06 (×2): 2.5 mg via ORAL
  Filled 2018-11-05 (×2): qty 1

## 2018-11-05 NOTE — ED Notes (Signed)
ED TO INPATIENT HANDOFF REPORT  ED Nurse Name and Phone #: Tray [place], [phone]  S Name/Age/Gender Andre Wolfe 84 y.o. male Room/Bed: 021C/021C  Code Status   Code Status: Prior  Home/SNF/Other Home Patient oriented to: self, place, time and situation Is this baseline? Yes   Triage Complete: Triage complete  Chief Complaint code stroke  Triage Note Pt arrived via [facility]; pt from hm with c/o LA and LLE wkness along with slurred speech. LSW was 6p yesterday; pt has hx of previous CVA with L sided deficits; Pt on Eliquis; CBG 168, 54, 190/100; 54   Allergies Allergies  Allergen Reactions  . Crestor [Rosuvastatin Calcium]   . Lipitor [Atorvastatin Calcium]     Muscle weakness and arm pain  . Zetia [Ezetimibe]   . Zocor [Simvastatin]     Level of Care/Admitting Diagnosis ED Disposition    ED Disposition Condition [facility] Area: [place] [[id number]]  Level of Care: Telemetry Medical [130]  Covid Evaluation: N/A  Diagnosis: Stroke [facility]) [[id number]]  Admitting Physician: Karmen Bongo [[id number]]  Attending Physician: Karmen Bongo [[id number]]  Estimated length of stay: past midnight tomorrow  Certification:: I certify this patient will need inpatient services for at least 2 midnights  PT Class (Do Not Modify): Inpatient [101]  PT Acc Code (Do Not Modify): Private [1]       B Medical/Surgery History Past Medical History:  Diagnosis Date  . Arthritis    "joints" (07/13/2018)  . Coronary artery disease    a. s/p CABG 12/2015.  Marland Kitchen CVA (cerebral vascular accident) ([place]) 07/13/2018   right pontine infarct; "said he's had one in the past; probably 2 yrs ago; same symptoms" (07/13/2018)  . Diabetic peripheral neuropathy ([place])   . GERD (gastroesophageal reflux disease)   . Hyperlipidemia   . Hypertension   . Postoperative atrial fibrillation ([place])   . Prostate cancer ([place])    d'x 2015/2016  . TIA (transient ischemic attack)   . Type II  diabetes mellitus ([place])    Past Surgical History:  Procedure Laterality Date  . CARDIAC CATHETERIZATION  2017   "not completed; went straight to [facility]"  . CATARACT EXTRACTION W/ INTRAOCULAR LENS  IMPLANT, BILATERAL Bilateral   . CORONARY ARTERY BYPASS GRAFT  2017   "CABG X 4"  . TONSILLECTOMY       A IV Location/Drains/Wounds Patient Lines/Drains/Airways Status   Active Line/Drains/Airways    Name:   Placement date:   Placement time:   Site:   Days:   External Urinary Catheter   07/13/18    1826    -   115          Intake/Output Last 24 hours No intake or output data in the 24 hours ending 11/05/18 1032  Labs/Imaging Results for orders placed or performed during the hospital encounter of 11/05/18 (from the past 48 hour(s))  Ethanol     Status: None   Collection Time: 11/05/18  8:19 AM  Result Value Ref Range   Alcohol, Ethyl (B) <10 <10 mg/dL    Comment: (NOTE) Lowest detectable limit for serum alcohol is 10 mg/dL. For medical purposes only. Performed at [facility], [place] [address] [place], [place] [address]   Protime-INR     Status: Abnormal   Collection Time: 11/05/18  8:19 AM  Result Value Ref Range   Prothrombin Time 16.1 (H) 11.4 - 15.2 seconds   INR 1.3 (H) 0.8 - 1.2  Comment: (NOTE) INR goal varies based on device and disease states. Performed at [facility], [place] [address], [place], [place] [address]   APTT     Status: None   Collection Time: 11/05/18  8:19 AM  Result Value Ref Range   aPTT 33 24 - 36 seconds    Comment: Performed at [place] [address], [place], [place] [address]  CBC     Status: None   Collection Time: 11/05/18  8:19 AM  Result Value Ref Range   WBC 7.4 4.0 - 10.5 K/uL   RBC 5.38 4.22 - 5.81 MIL/uL   Hemoglobin 15.9 13.0 - 17.0 g/dL   HCT 46.4 39.0 - 52.0 %   MCV 86.2 80.0 - 100.0 fL   MCH 29.6 26.0 - 34.0 pg   MCHC 34.3 30.0 - 36.0 g/dL   RDW 12.8 11.5 - 15.5 %   Platelets 211 150 - 400 K/uL    nRBC 0.0 0.0 - 0.2 %    Comment: Performed at [facility], [place] [address], [place], [place] [address]  Differential     Status: None   Collection Time: 11/05/18  8:19 AM  Result Value Ref Range   Neutrophils Relative % 66 %   Neutro Abs 4.9 1.7 - 7.7 K/uL   Lymphocytes Relative 19 %   Lymphs Abs 1.4 0.7 - 4.0 K/uL   Monocytes Relative 11 %   Monocytes Absolute 0.8 0.1 - 1.0 K/uL   Eosinophils Relative 3 %   Eosinophils Absolute 0.2 0.0 - 0.5 K/uL   Basophils Relative 1 %   Basophils Absolute 0.1 0.0 - 0.1 K/uL   Immature Granulocytes 0 %   Abs Immature Granulocytes 0.02 0.00 - 0.07 K/uL    Comment: Performed at [facility], [place] [address], [place], [place] [address]  Comprehensive metabolic panel     Status: Abnormal   Collection Time: 11/05/18  8:19 AM  Result Value Ref Range   Sodium 137 135 - 145 mmol/L   Potassium 4.8 3.5 - 5.1 mmol/L   Chloride 103 98 - 111 mmol/L   CO2 24 22 - 32 mmol/L   Glucose, Bld 188 (H) 70 - 99 mg/dL   BUN 15 8 - 23 mg/dL   Creatinine, Ser 1.08 0.61 - 1.24 mg/dL   Calcium 9.3 8.9 - 10.3 mg/dL   Total Protein 6.9 6.5 - 8.1 g/dL   Albumin 3.7 3.5 - 5.0 g/dL   AST 25 15 - 41 U/L   ALT 25 0 - 44 U/L   Alkaline Phosphatase 52 38 - 126 U/L   Total Bilirubin 1.1 0.3 - 1.2 mg/dL   GFR calc non Af Amer >60 >60 mL/min   GFR calc Af Amer >60 >60 mL/min   Anion gap 10 5 - 15    Comment: Performed at [facility], [place] [address] [place], [place] [address]  CBG monitoring, ED     Status: Abnormal   Collection Time: 11/05/18  8:24 AM  Result Value Ref Range   Glucose-Capillary 194 (H) 70 - 99 mg/dL  I-stat Creatinine, ED     Status: None   Collection Time: 11/05/18  8:31 AM  Result Value Ref Range   Creatinine, Ser 1.00 0.61 - 1.24 mg/dL   Ct Angio Head W Or Wo Contrast  Result Date: 11/05/2018 CLINICAL DATA:  Code stroke.  84 year old male. EXAM: CT ANGIOGRAPHY HEAD AND NECK TECHNIQUE: Multidetector CT imaging  of the head and neck  was performed using the standard protocol during bolus administration of intravenous contrast. Multiplanar CT image reconstructions and MIPs were obtained to evaluate the vascular anatomy. Carotid stenosis measurements (when applicable) are obtained utilizing NASCET criteria, using the distal internal carotid diameter as the denominator. CONTRAST:  52mL OMNIPAQUE IOHEXOL 350 MG/ML SOLN COMPARISON:  Head CT without contrast 0824 hours today. MRI and intracranial MRA 07/13/2018. FINDINGS: CTA NECK Skeleton: Prior sternotomy. No acute osseous abnormality identified. Upper chest: Centrilobular emphysema. No superior mediastinal lymphadenopathy. Other neck: Mild thyroid goiter on the right. Small 12 millimeter left parotid gland nodule is stable since December (series 9, image 163) and head hypercellular features on MRI. Other neck soft tissues are within normal limits. Aortic arch: Calcified aortic atherosclerosis. 3 vessel arch configuration. Right carotid system: No brachiocephalic or right CCA origin stenosis despite plaque. Soft and calcified plaque at the right ICA origin and bulb with less than 50 % stenosis with respect to the distal vessel. Left carotid system: No left CCA origin stenosis. Mildly tortuous left CCA. Soft and calcified plaque at the left ICA origin and bulb with less than 50 % stenosis with respect to the distal vessel. Vertebral arteries: Tortuous proximal right subclavian artery with soft and calcified plaque but no stenosis. Normal right vertebral artery origin. Patent right vertebral artery to the skull base without stenosis. Mildly tortuous proximal left subclavian artery with soft and calcified plaque but no stenosis. Patent left vertebral artery origin without stenosis. Mild left V1 calcified plaque and tortuosity. Left V2 segment irregularity intermittently throughout the neck suggesting multifocal soft plaque, but no hemodynamically significant stenosis identified to the skull base. CTA  HEAD Posterior circulation: Codominant distal vertebral arteries without stenosis. Patent vertebrobasilar junction and PICA origins. Patent basilar artery without stenosis. Normal SCA and right PCA origins. Moderate to severe irregularity and stenosis redemonstrated at the left PCA origin and stable from the prior MRA. The left P2 segment is mildly irregular but the other left PCA branches are within normal limits. There is contralateral moderate to severe right P2 segment stenosis which appears progressed since December, but with preserved distal right PCA enhancement. Anterior circulation: Both ICA siphons are patent. Extensive left siphon plaque including bulky calcified plaque in the cavernous and proximal supraclinoid segments and soft plaque in the petrous segment. Mild to moderate subsequent left ICA stenosis appears stable since December. Normal left ophthalmic artery origin. Posterior communicating arteries are diminutive or absent. Right ICA siphon similar soft and calcified plaque with mild to moderate stenosis stable since December. Normal left ophthalmic artery origin. Patent carotid termini. Dominant left and diminutive or absent right ACA A1 segments. Normal left A1, anterior communicating artery and bilateral ACA branches. Left MCA M1 segment and trifurcation are patent with mild irregularity that appears stable since December. Left MCA branches are stable. Right MCA M1 and bifurcation are patent and appear stable since December. Right MCA branches appear stable. Venous sinuses: Not well evaluated due to early contrast timing Anatomic variants: Dominant left ACA A1. Bovine arch configuration. Review of the MIP images confirms the above findings IMPRESSION: 1. Negative for emergent large vessel occlusion. 2. Widespread intracranial atherosclerosis which appears largely stable since the December MRA. - possible increased moderate to severe Right PCA P2 stenosis. - chronic severe Left PCA origin  stenosis. - up to moderate bilateral ICA siphon stenosis. 3. Atherosclerosis in the neck and Aortic Atherosclerosis (ICD10-I70.0). No significant cervical carotid or vertebral artery stenosis identified. 4. 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 Recommend outpatient ENT follow-up. Salient findings communicated to Dr. Rory Percy at [phone] hours on 11/05/2018 by text page via the [facility] messaging system. Electronically Signed   By: Genevie Ann M.D.   On: 11/05/2018 08:50   Ct Angio Neck W Or Wo Contrast  Result Date: 11/05/2018 CLINICAL DATA:  Code stroke.  84 year old male. EXAM: CT ANGIOGRAPHY HEAD AND NECK TECHNIQUE: Multidetector CT imaging of the head and neck was performed using the standard protocol during bolus administration of intravenous contrast. Multiplanar CT image reconstructions and MIPs were obtained to evaluate the vascular anatomy. Carotid stenosis measurements (when applicable) are obtained utilizing NASCET criteria, using the distal internal carotid diameter as the denominator. CONTRAST:  36mL OMNIPAQUE IOHEXOL 350 MG/ML SOLN COMPARISON:  Head CT without contrast 0824 hours today. MRI and intracranial MRA 07/13/2018. FINDINGS: CTA NECK Skeleton: Prior sternotomy. No acute osseous abnormality identified. Upper chest: Centrilobular emphysema. No superior mediastinal lymphadenopathy. Other neck: Mild thyroid goiter on the right. Small 12 millimeter left parotid gland nodule is stable since December (series 9, image 163) and head hypercellular features on MRI. Other neck soft tissues are within normal limits. Aortic arch: Calcified aortic atherosclerosis. 3 vessel arch configuration. Right carotid system: No brachiocephalic or right CCA origin stenosis despite plaque. Soft and calcified plaque at the right ICA origin and bulb with less than 50 % stenosis with respect to the distal vessel. Left carotid system: No left CCA origin stenosis. Mildly tortuous left  CCA. Soft and calcified plaque at the left ICA origin and bulb with less than 50 % stenosis with respect to the distal vessel. Vertebral arteries: Tortuous proximal right subclavian artery with soft and calcified plaque but no stenosis. Normal right vertebral artery origin. Patent right vertebral artery to the skull base without stenosis. Mildly tortuous proximal left subclavian artery with soft and calcified plaque but no stenosis. Patent left vertebral artery origin without stenosis. Mild left V1 calcified plaque and tortuosity. Left V2 segment irregularity intermittently throughout the neck suggesting multifocal soft plaque, but no hemodynamically significant stenosis identified to the skull base. CTA HEAD Posterior circulation: Codominant distal vertebral arteries without stenosis. Patent vertebrobasilar junction and PICA origins. Patent basilar artery without stenosis. Normal SCA and right PCA origins. Moderate to severe irregularity and stenosis redemonstrated at the left PCA origin and stable from the prior MRA. The left P2 segment is mildly irregular but the other left PCA branches are within normal limits. There is contralateral moderate to severe right P2 segment stenosis which appears progressed since December, but with preserved distal right PCA enhancement. Anterior circulation: Both ICA siphons are patent. Extensive left siphon plaque including bulky calcified plaque in the cavernous and proximal supraclinoid segments and soft plaque in the petrous segment. Mild to moderate subsequent left ICA stenosis appears stable since December. Normal left ophthalmic artery origin. Posterior communicating arteries are diminutive or absent. Right ICA siphon similar soft and calcified plaque with mild to moderate stenosis stable since December. Normal left ophthalmic artery origin. Patent carotid termini. Dominant left and diminutive or absent right ACA A1 segments. Normal left A1, anterior communicating artery and  bilateral ACA branches. Left MCA M1 segment and trifurcation are patent with mild irregularity that appears stable since December. Left MCA branches are stable. Right MCA M1 and bifurcation are patent and appear stable since December. Right MCA branches appear stable. Venous sinuses: Not well evaluated due to early contrast timing Anatomic  variants: Dominant left ACA A1. Bovine arch configuration. Review of the MIP images confirms the above findings IMPRESSION: 1. Negative for emergent large vessel occlusion. 2. Widespread intracranial atherosclerosis which appears largely stable since the December MRA. - possible increased moderate to severe Right PCA P2 stenosis. - chronic severe Left PCA origin stenosis. - up to moderate bilateral ICA siphon stenosis. 3. Atherosclerosis in the neck and Aortic Atherosclerosis (ICD10-I70.0). No significant cervical carotid or vertebral artery stenosis identified. 4. 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 Recommend outpatient ENT follow-up. Salient findings communicated to Dr. Rory Percy at [phone] hours on 11/05/2018 by text page via the [facility] messaging system. Electronically Signed   By: Genevie Ann M.D.   On: 11/05/2018 08:50   Ct Head Code Stroke Wo Contrast  Result Date: 11/05/2018 CLINICAL DATA:  Code stroke. 84 year old male. History of small vessel infarcts in 2019. EXAM: CT HEAD WITHOUT CONTRAST TECHNIQUE: Contiguous axial images were obtained from the base of the skull through the vertex without intravenous contrast. COMPARISON:  Brain MRI and head CT 07/13/2018 FINDINGS: Brain: Stable cerebral volume. No midline shift, mass effect, or evidence of intracranial mass lesion. No acute intracranial hemorrhage identified. No ventriculomegaly. Evolved right pontine infarct seen in December. Patchy bilateral cerebral white matter hypodensity. Asymmetric left occipital lobe sulcus or less likely chronic encephalomalacia is stable. Stable  gray-white matter differentiation throughout the brain. No cortically based acute infarct identified. Vascular: Calcified atherosclerosis at the skull base. No suspicious intracranial vascular hyperdensity. Skull: No acute osseous abnormality identified. Sinuses/Orbits: Visualized paranasal sinuses and mastoids are stable and well pneumatized. Other: No acute orbit or scalp soft tissue finding. ASPECTS [facility] Stroke Program Early CT Score) - Ganglionic level infarction (caudate, lentiform nuclei, internal capsule, insula, M1-M3 cortex): 7 - Supraganglionic infarction (M4-M6 cortex): 3 Total score (0-10 with 10 being normal): 10 IMPRESSION: 1. No acute cortically based infarct or acute intracranial hemorrhage identified. ASPECTS is 10. 2. Expected evolution of right pontine infarct since December. Chronic white matter disease. 3. These results were communicated to Dr. Rory Percy at 8:32 am on 4/23/2020by text page via the [facility] messaging system. Electronically Signed   By: Genevie Ann M.D.   On: 11/05/2018 08:33    Pending Labs Unresulted Labs (From admission, onward)    Start     Ordered   11/05/18 0819  Urine rapid drug screen (hosp performed)  ONCE - STAT,   STAT     11/05/18 0819   11/05/18 0819  Urinalysis, Routine w reflex microscopic  ONCE - STAT,   STAT     11/05/18 0819          Vitals/Pain Today's Vitals   11/05/18 0817 11/05/18 0828 11/05/18 0840  BP:  (!) 192/92   Pulse:  74   Resp:  16   Temp: 98.8 F (37.1 C)    TempSrc: Oral    SpO2:  96%   Weight:   95.3 kg  PainSc:   0-No pain    Isolation Precautions No active isolations  Medications Medications  iohexol (OMNIPAQUE) 350 MG/ML injection 100 mL (60 mLs Intravenous Contrast Given 11/05/18 0829)    Mobility walks with device Low fall risk   Focused Assessments Neuro Assessment Handoff:  Swallow screen pass? Not completed due to slurred speech   NIH Stroke Scale ( + Modified Stroke Scale Criteria)  Interval:  Initial Level of Consciousness (1a.)   : Alert, keenly responsive LOC Questions (1b. )   +:  Answers both questions correctly LOC Commands (1c. )   + : Performs both tasks correctly Best Gaze (2. )  +: Normal Visual (3. )  +: No visual loss Facial Palsy (4. )    : Minor paralysis Motor Arm, Left (5a. )   +: Drift Motor Arm, Right (5b. )   +: No drift Motor Leg, Left (6a. )   +: Drift Motor Leg, Right (6b. )   +: No drift Limb Ataxia (7. ): Absent Sensory (8. )   +: Normal, no sensory loss Best Language (9. )   +: No aphasia Dysarthria (10. ): Mild-to-moderate dysarthria, patient slurs at least some words and, at worst, can be understood with some difficulty Extinction/Inattention (11.)   +: Visual/tactile/auditory/spatial/personal inattention Modified SS Total  +: 3 Complete NIHSS TOTAL: 5 Last date known well: 11/04/18 Last time known well: 1800 Neuro Assessment:   Neuro Checks:   Initial (11/05/18 0900)  Last Documented NIHSS Modified Score: 3 (11/05/18 0900) Has TPA been given? No If patient is a Neuro Trauma and patient is going to OR before floor call report to [place] nurse: [phone] or [phone]     R Recommendations: See Admitting Provider Note  Report given to:   Additional Notes:  Pt in MRI and will be sent to floor after

## 2018-11-05 NOTE — ED Triage Notes (Signed)
Pt arrived via [facility]; pt from [facility]m with c/o LA and LLE wkness along with slurred speech. LSW was 6p yesterday; pt has hx of previous CVA with L sided deficits; Pt on Eliquis; CBG 168, 54, 190/100; 54

## 2018-11-05 NOTE — Progress Notes (Signed)
Pt arrived to 3W29 alert and oriented x4. Complains of no pain, telemetry verified. Will continue to monitor.

## 2018-11-05 NOTE — H&P (Signed)
History and Physical    Andre Wolfe:[id number] DOB: 04/30/1935 DOA: 11/05/2018  PCP: [name], MD Consultants:  Leonie Man - neurology; [place] - cardiology; Cruzita Lederer - endocrinology Patient coming from:  Home - lives with wife; NOK: Wife  Chief Complaint: Code stroke  HPI: Andre Wolfe is a 84 y.o. male with medical history significant of DM; prostate CA; afib on Eliquis; HTN; HLD; CVA in 12/19; and CAD s/p CABG presenting with code stroke.  Last night, he lost the use of his left arm and leg.  He had a stroke in January and had been doing well.  He was fine during the day until then.  He did not have dysarthria.  He "kind of" had trouble swallowing - after going to bed, he had to concentrate to swallow.  His symptoms on the arm and leg are persistent without significant improvement, but he is not still having trouble swallowing.     ED Course:  H/o R pontine stroke in Dec without residual deficits.  Left hemiparesis at 6pm yesterday with persistent symptoms today.  LVO negative with neurology, not a candidate for TPA.  Recommend MRI and admission for stroke work-up.  Review of Systems: As per HPI; otherwise review of systems reviewed and negative.   Ambulatory Status:  Ambulates with a walker  Past Medical History:  Diagnosis Date   Arthritis    "joints" (07/13/2018)   Coronary artery disease    a. s/p CABG 12/2015.   CVA (cerebral vascular accident) ([place]) 07/13/2018   right pontine infarct; "said he's had one in the past; probably 2 yrs ago; same symptoms" (07/13/2018)   Diabetic peripheral neuropathy (HCC)    GERD (gastroesophageal reflux disease)    Hyperlipidemia    Hypertension    Postoperative atrial fibrillation (HCC)    Prostate cancer ([place])    d'x 2015/2016   TIA (transient ischemic attack)    Type II diabetes mellitus ([place])     Past Surgical History:  Procedure Laterality Date   CARDIAC CATHETERIZATION  2017   "not completed; went straight to [facility]"     CATARACT EXTRACTION W/ INTRAOCULAR LENS  IMPLANT, BILATERAL Bilateral    CORONARY ARTERY BYPASS GRAFT  2017   "CABG X 4"   TONSILLECTOMY      Social History   Socioeconomic History   Marital status: Married    Spouse name: Not on file   Number of children: 2   Years of education: Not on file   Highest education level: Not on file  Occupational History   Occupation: farmer  Scientist, product/process development strain: Not on file   Food insecurity:    Worry: Not on file    Inability: Not on file   Transportation needs:    Medical: Not on file    Non-medical: Not on file  Tobacco Use   Smoking status: Former Smoker    Packs/day: 0.12    Years: 65.00    Pack years: 7.80    Last attempt to quit: 07/08/2018    Years since quitting: 0.3   Smokeless tobacco: Never Used  Substance and Sexual Activity   Alcohol use: Not Currently   Drug use: Never   Sexual activity: Not on file  Lifestyle   Physical activity:    Days per week: Not on file    Minutes per session: Not on file   Stress: Not on file  Relationships   Social connections:    Talks on phone:  Not on file    Gets together: Not on file    Attends religious service: Not on file    Active member of club or organization: Not on file    Attends meetings of clubs or organizations: Not on file    Relationship status: Not on file   Intimate partner violence:    Fear of current or ex partner: Not on file    Emotionally abused: Not on file    Physically abused: Not on file    Forced sexual activity: Not on file  Other Topics Concern   Not on file  Social History Narrative   Not on file    Allergies  Allergen Reactions   Crestor [Rosuvastatin Calcium]    Lipitor [Atorvastatin Calcium]     Muscle weakness and arm pain   Zetia [Ezetimibe]    Zocor [Simvastatin]     Family History  Problem Relation Age of Onset   Heart disease Mother    Breast cancer Mother    Leukemia Father     Diabetes Brother    Breast cancer Sister    Stroke Neg Hx     Prior to Admission medications   Medication Sig Start Date End Date Taking? Authorizing Provider  acetaminophen (TYLENOL) 325 MG tablet Take 1-2 tablets (325-650 mg total) by mouth every 4 (four) hours as needed for mild pain. 07/22/18  Yes Love, Ivan Anchors, PA-C  amLODipine (NORVASC) 2.5 MG tablet Take 1 tablet (2.5 mg total) by mouth daily. 07/24/18 11/05/18 Yes Love, Ivan Anchors, PA-C  apixaban (ELIQUIS) 5 MG TABS tablet Take 1 tablet (5 mg total) by mouth 2 (two) times daily. 08/19/18  Yes [place], Peter M, MD  Evolocumab (REPATHA SURECLICK) 326 MG/ML SOAJ Inject 140 mg into the skin every 14 (fourteen) days. 06/26/18  Yes [place], Peter M, MD  gabapentin (NEURONTIN) 600 MG tablet Take 600 mg by mouth at bedtime.  11/20/17  Yes [provider]  Insulin Isophane & Regular Human (NOVOLIN 70/30 FLEXPEN) (70-30) 100 UNIT/ML PEN Inject 10-30 Units into the skin See admin instructions. Taking30 units in the Am and10 units Midday, and 20 units before dinner.   Yes [provider]  metoprolol tartrate (LOPRESSOR) 25 MG tablet TAKE 1/2 (ONE-HALF) TABLET BY MOUTH TWICE DAILY Patient taking differently: Take 12.5 mg by mouth 2 (two) times daily.  08/25/18  Yes [place], Luke K, PA-C  omeprazole (PRILOSEC) 20 MG capsule Take 20 mg by mouth daily.   Yes [provider]    Physical Exam: Vitals:   11/05/18 0840 11/05/18 1129 11/05/18 1146 11/05/18 1147  BP:  (!) 211/86 (!) 213/81 (!) 207/76  Pulse:  61 (!) 58   Resp:  18 18   Temp:   98.6 F (37 C)   TempSrc:   Oral   SpO2:   98%   Weight: 95.3 kg         General:  Appears calm and comfortable and is NAD  Eyes:  PERRL, EOMI, normal lids, iris  ENT:  grossly normal hearing, lips & tongue, mmm; artificial dentition  Neck:  no LAD, masses or thyromegaly; no carotid bruits  Cardiovascular:  RRR, no m/r/g. No LE edema.   Respiratory:   CTA bilaterally with no  wheezes/rales/rhonchi.  Normal respiratory effort.  Abdomen:  soft, NT, ND, NABS  Skin:  no rash or induration seen on limited exam  Musculoskeletal:  Marked LUE and LLE hemiparesis, 2-3/5 strength  Lower extremity:  No LE edema.  Limited foot exam with no ulcerations.  2+ distal pulses.  Psychiatric:  blunted mood and affect with intermittent emotional lability, speech fluent and appropriate with intermittent mild dysarthria, AOx3  Neurologic:  CN 2-12 grossly intact, moves all extremities in coordinated fashion except as described above, sensation intact    Radiological Exams on Admission: Ct Angio Head W Or Wo Contrast  Result Date: 11/05/2018 CLINICAL DATA:  Code stroke.  84 year old male. EXAM: CT ANGIOGRAPHY HEAD AND NECK TECHNIQUE: Multidetector CT imaging of the head and neck was performed using the standard protocol during bolus administration of intravenous contrast. Multiplanar CT image reconstructions and MIPs were obtained to evaluate the vascular anatomy. Carotid stenosis measurements (when applicable) are obtained utilizing NASCET criteria, using the distal internal carotid diameter as the denominator. CONTRAST:  40mL OMNIPAQUE IOHEXOL 350 MG/ML SOLN COMPARISON:  Head CT without contrast 0824 hours today. MRI and intracranial MRA 07/13/2018. FINDINGS: CTA NECK Skeleton: Prior sternotomy. No acute osseous abnormality identified. Upper chest: Centrilobular emphysema. No superior mediastinal lymphadenopathy. Other neck: Mild thyroid goiter on the right. Small 12 millimeter left parotid gland nodule is stable since December (series 9, image 163) and head hypercellular features on MRI. Other neck soft tissues are within normal limits. Aortic arch: Calcified aortic atherosclerosis. 3 vessel arch configuration. Right carotid system: No brachiocephalic or right CCA origin stenosis despite plaque. Soft and calcified plaque at the right ICA origin and bulb with less than 50 % stenosis with  respect to the distal vessel. Left carotid system: No left CCA origin stenosis. Mildly tortuous left CCA. Soft and calcified plaque at the left ICA origin and bulb with less than 50 % stenosis with respect to the distal vessel. Vertebral arteries: Tortuous proximal right subclavian artery with soft and calcified plaque but no stenosis. Normal right vertebral artery origin. Patent right vertebral artery to the skull base without stenosis. Mildly tortuous proximal left subclavian artery with soft and calcified plaque but no stenosis. Patent left vertebral artery origin without stenosis. Mild left V1 calcified plaque and tortuosity. Left V2 segment irregularity intermittently throughout the neck suggesting multifocal soft plaque, but no hemodynamically significant stenosis identified to the skull base. CTA HEAD Posterior circulation: Codominant distal vertebral arteries without stenosis. Patent vertebrobasilar junction and PICA origins. Patent basilar artery without stenosis. Normal SCA and right PCA origins. Moderate to severe irregularity and stenosis redemonstrated at the left PCA origin and stable from the prior MRA. The left P2 segment is mildly irregular but the other left PCA branches are within normal limits. There is contralateral moderate to severe right P2 segment stenosis which appears progressed since December, but with preserved distal right PCA enhancement. Anterior circulation: Both ICA siphons are patent. Extensive left siphon plaque including bulky calcified plaque in the cavernous and proximal supraclinoid segments and soft plaque in the petrous segment. Mild to moderate subsequent left ICA stenosis appears stable since December. Normal left ophthalmic artery origin. Posterior communicating arteries are diminutive or absent. Right ICA siphon similar soft and calcified plaque with mild to moderate stenosis stable since December. Normal left ophthalmic artery origin. Patent carotid termini. Dominant  left and diminutive or absent right ACA A1 segments. Normal left A1, anterior communicating artery and bilateral ACA branches. Left MCA M1 segment and trifurcation are patent with mild irregularity that appears stable since December. Left MCA branches are stable. Right MCA M1 and bifurcation are patent and appear stable since December. Right MCA branches appear stable. Venous sinuses: Not well evaluated due to early contrast  timing Anatomic variants: Dominant left ACA A1. Bovine arch configuration. Review of the MIP images confirms the above findings IMPRESSION: 1. Negative for emergent large vessel occlusion. 2. Widespread intracranial atherosclerosis which appears largely stable since the December MRA. - possible increased moderate to severe Right PCA P2 stenosis. - chronic severe Left PCA origin stenosis. - up to moderate bilateral ICA siphon stenosis. 3. Atherosclerosis in the neck and Aortic Atherosclerosis (ICD10-I70.0). No significant cervical carotid or vertebral artery stenosis identified. 4. 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 Recommend outpatient ENT follow-up. Salient findings communicated to Dr. Rory Percy at [phone] hours on 11/05/2018 by text page via the [facility] messaging system. Electronically Signed   By: Genevie Ann M.D.   On: 11/05/2018 08:50   Ct Angio Neck W Or Wo Contrast  Result Date: 11/05/2018 CLINICAL DATA:  Code stroke.  83 year old male. EXAM: CT ANGIOGRAPHY HEAD AND NECK TECHNIQUE: Multidetector CT imaging of the head and neck was performed using the standard protocol during bolus administration of intravenous contrast. Multiplanar CT image reconstructions and MIPs were obtained to evaluate the vascular anatomy. Carotid stenosis measurements (when applicable) are obtained utilizing NASCET criteria, using the distal internal carotid diameter as the denominator. CONTRAST:  33mL OMNIPAQUE IOHEXOL 350 MG/ML SOLN COMPARISON:  Head CT without  contrast 0824 hours today. MRI and intracranial MRA 07/13/2018. FINDINGS: CTA NECK Skeleton: Prior sternotomy. No acute osseous abnormality identified. Upper chest: Centrilobular emphysema. No superior mediastinal lymphadenopathy. Other neck: Mild thyroid goiter on the right. Small 12 millimeter left parotid gland nodule is stable since December (series 9, image 163) and head hypercellular features on MRI. Other neck soft tissues are within normal limits. Aortic arch: Calcified aortic atherosclerosis. 3 vessel arch configuration. Right carotid system: No brachiocephalic or right CCA origin stenosis despite plaque. Soft and calcified plaque at the right ICA origin and bulb with less than 50 % stenosis with respect to the distal vessel. Left carotid system: No left CCA origin stenosis. Mildly tortuous left CCA. Soft and calcified plaque at the left ICA origin and bulb with less than 50 % stenosis with respect to the distal vessel. Vertebral arteries: Tortuous proximal right subclavian artery with soft and calcified plaque but no stenosis. Normal right vertebral artery origin. Patent right vertebral artery to the skull base without stenosis. Mildly tortuous proximal left subclavian artery with soft and calcified plaque but no stenosis. Patent left vertebral artery origin without stenosis. Mild left V1 calcified plaque and tortuosity. Left V2 segment irregularity intermittently throughout the neck suggesting multifocal soft plaque, but no hemodynamically significant stenosis identified to the skull base. CTA HEAD Posterior circulation: Codominant distal vertebral arteries without stenosis. Patent vertebrobasilar junction and PICA origins. Patent basilar artery without stenosis. Normal SCA and right PCA origins. Moderate to severe irregularity and stenosis redemonstrated at the left PCA origin and stable from the prior MRA. The left P2 segment is mildly irregular but the other left PCA branches are within normal limits.  There is contralateral moderate to severe right P2 segment stenosis which appears progressed since December, but with preserved distal right PCA enhancement. Anterior circulation: Both ICA siphons are patent. Extensive left siphon plaque including bulky calcified plaque in the cavernous and proximal supraclinoid segments and soft plaque in the petrous segment. Mild to moderate subsequent left ICA stenosis appears stable since December. Normal left ophthalmic artery origin. Posterior communicating arteries are diminutive or absent. Right ICA siphon similar soft and calcified plaque with  mild to moderate stenosis stable since December. Normal left ophthalmic artery origin. Patent carotid termini. Dominant left and diminutive or absent right ACA A1 segments. Normal left A1, anterior communicating artery and bilateral ACA branches. Left MCA M1 segment and trifurcation are patent with mild irregularity that appears stable since December. Left MCA branches are stable. Right MCA M1 and bifurcation are patent and appear stable since December. Right MCA branches appear stable. Venous sinuses: Not well evaluated due to early contrast timing Anatomic variants: Dominant left ACA A1. Bovine arch configuration. Review of the MIP images confirms the above findings IMPRESSION: 1. Negative for emergent large vessel occlusion. 2. Widespread intracranial atherosclerosis which appears largely stable since the December MRA. - possible increased moderate to severe Right PCA P2 stenosis. - chronic severe Left PCA origin stenosis. - up to moderate bilateral ICA siphon stenosis. 3. Atherosclerosis in the neck and Aortic Atherosclerosis (ICD10-I70.0). No significant cervical carotid or vertebral artery stenosis identified. 4. 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 Recommend outpatient ENT follow-up. Salient findings communicated to Dr. Rory Percy at [phone] hours on 11/05/2018 by text page via the  [facility] messaging system. Electronically Signed   By: Genevie Ann M.D.   On: 11/05/2018 08:50   Mr Brain Wo Contrast (neuro Protocol)  Result Date: 11/05/2018 CLINICAL DATA:  Left-sided weakness and dysarthria.  Prior stroke. EXAM: MRI HEAD WITHOUT CONTRAST TECHNIQUE: Multiplanar, multiecho pulse sequences of the brain and surrounding structures were obtained without intravenous contrast. COMPARISON:  Head CT and CTA 11/05/2018 and MRI 07/13/2018 FINDINGS: Brain: There is a recurrent acute right pontine infarct, smaller than that on the prior MRI. The acute ischemia is located along the margins of the old infarct and predominantly posterior to it. Diffusion abnormality associated with the punctate left parietal infarct on the prior MRI has resolved. Patchy T2 hyperintensities in the cerebral white matter are unchanged and nonspecific but compatible with chronic small vessel ischemic disease, mildly advanced for age. Mild cerebral atrophy for age is also unchanged. No intracranial hemorrhage, mass, midline shift, or extra-axial fluid collection is identified. Vascular: Major intracranial vascular flow voids are preserved. Skull and upper cervical spine: Unremarkable bone marrow signal. Sinuses/Orbits: Bilateral cataract extraction. Mild left maxillary sinus mucosal thickening. Clear mastoid air cells. Other: None. IMPRESSION: 1. Recurrent acute right pontine infarct. 2. Mild chronic small vessel ischemic disease in the cerebral white matter. Electronically Signed   By: Logan Bores M.D.   On: 11/05/2018 11:50   Ct Head Code Stroke Wo Contrast  Result Date: 11/05/2018 CLINICAL DATA:  Code stroke. 84 year old male. History of small vessel infarcts in 2019. EXAM: CT HEAD WITHOUT CONTRAST TECHNIQUE: Contiguous axial images were obtained from the base of the skull through the vertex without intravenous contrast. COMPARISON:  Brain MRI and head CT 07/13/2018 FINDINGS: Brain: Stable cerebral volume. No midline shift, mass  effect, or evidence of intracranial mass lesion. No acute intracranial hemorrhage identified. No ventriculomegaly. Evolved right pontine infarct seen in December. Patchy bilateral cerebral white matter hypodensity. Asymmetric left occipital lobe sulcus or less likely chronic encephalomalacia is stable. Stable gray-white matter differentiation throughout the brain. No cortically based acute infarct identified. Vascular: Calcified atherosclerosis at the skull base. No suspicious intracranial vascular hyperdensity. Skull: No acute osseous abnormality identified. Sinuses/Orbits: Visualized paranasal sinuses and mastoids are stable and well pneumatized. Other: No acute orbit or scalp soft tissue finding. ASPECTS [facility] Stroke Program Early CT Score) - Ganglionic level infarction (caudate,  lentiform nuclei, internal capsule, insula, M1-M3 cortex): 7 - Supraganglionic infarction (M4-M6 cortex): 3 Total score (0-10 with 10 being normal): 10 IMPRESSION: 1. No acute cortically based infarct or acute intracranial hemorrhage identified. ASPECTS is 10. 2. Expected evolution of right pontine infarct since December. Chronic white matter disease. 3. These results were communicated to Dr. Rory Percy at 8:32 am on 4/23/2020by text page via the [facility] messaging system. Electronically Signed   By: Genevie Ann M.D.   On: 11/05/2018 08:33    EKG: Independently reviewed.  NSR with rate 58; nonspecific ST changes with no evidence of acute ischemia   Labs on Admission: I have personally reviewed the available labs and imaging studies at the time of the admission.  Pertinent labs:   Glucose 188 Normal CBC INR 1.3 ETOH <10  Assessment/Plan Principal Problem:   Acute ischemic stroke [facility]) Active Problems:   Hypercholesterolemia   Essential hypertension   Poorly controlled type 2 diabetes mellitus with peripheral neuropathy (HCC)   PAF (paroxysmal atrial fibrillation) ([place])   Acute ischemic stroke -Patient with prior CVA in  12/19 without residual deficits but now with left hemiparesis since yesterday -Symptoms are c/w recurrent CVA and MRI confirms acute R pontine infarct -Will admit for further CVA evaluation -Telemetry monitoring -Risk stratification with repeat FLP, A1c -He has not been taking ASA or antiplatelet therapy daily, only Eliquis; will start ASA and he is likely to benefit from DAPT for a short period of time -Neurology consult -PT/OT/ST/Nutrition Consults -CIR consult for placement  HTN -Allow permissive HTN for now -Treat BP only if >220/120, and then with goal of 15% reduction -Hold CCB ,BB and plan to restart in 48-72 hours   HLD -He has been taking Repatha (PCSK9 inhibitor) and we will check FLP to see if this efficacious -He has a h/o mylagias in the past with statins   DM -Prior A1c (8.9 on 12/31) shows fairly poor control, will repeat -Continue home 70/30 insulin since he is able to take PO  -Will order moderate-scale SSI  Afib on Eliquis -Continue Eliquis -He is rate controlled at this time -If stroke is thought to be embolic associated with afib, he may need to transition to an alternate agent for [facility]  Chronic diastolic CHF -Prior echo on 12/31 showed preserved EF with grade 2 diastolic dysfunction -The patient appears to be compensated at this time     DVT prophylaxis:  Eliquis  Code Status:  DNR- confirmed with patient Family Communication: None present Disposition Plan:  Home once clinically improved Consults called: Neurology, CIR; PT/OT/ST/Nutrition  Admission status: Admit - It is my clinical opinion that admission to INPATIENT is reasonable and necessary because of the expectation that this patient will require hospital care that crosses at least 2 midnights to treat this condition based on the medical complexity of the problems presented.  Given the aforementioned information, the predictability of an adverse outcome is felt to be significant.     Karmen Bongo  MD [facility]   How to contact the [facility] Attending or Consulting provider [place] or covering provider during after hours [place], for this patient?  1. Check the care team in [facility] and look for a) attending/consulting [facility] provider listed and b) the [facility] team listed 2. Log into [email or web address] and use [place]'s universal password to access. If you do not have the password, please contact the hospital operator. 3. Locate the [facility] provider you are looking for under [facility] and page  to a number that you can be directly reached. 4. If you still have difficulty reaching the provider, please page the [facility] (Director on Call) for the Hospitalists listed on amion for assistance.   11/05/2018, 1:19 PM

## 2018-11-05 NOTE — Code Documentation (Signed)
Patient LKW yesterday about 6pm, new facial droop and left side weakness noted, symptoms persisted this am and EMS was called. Patient on Eliquis last dose last night, he also has a history of previous CVA initially affecting left side but no "lasting deficits".   Code Stroke called en route.  Dr Adair Patter met patient upon arrival at 1816  Stat head CT and labs done.  NIHSS 4 drift left arm and leg, mild facial droop and mild slurred speech.  BP 192/92  HR 74  RR 16  O2 sat 96% on RA  CTA Head and neck done.  No acute intervention hand off with ED RN [place].

## 2018-11-05 NOTE — Consult Note (Addendum)
Neurology Consultation  Reason for Consult: left sided weakness Referring Physician:   CC: dysarthria/ left sided weakness  History is obtained from: Patient  HPI: Andre Wolfe is a 84 y.o. male with history of type 2 diabetes, TIA, stroke, hypertension, hyperlipidemia, CAD and atrial fibrillation on anticoagulation with last dose of Eliquis last night.  Patient and family noticed that last night at approximately 1800 hrs. he had a left facial droop, dysarthria, left-sided weakness.  Family wanted to bring him to the hospital however, he refused.  This a.m. his symptoms did not resolve thus EMS was called and brought in as a code stroke.  On the bridge patient had dysarthria, left facial droop, and weakness on the left side.  No cortical signs on initial exam.  ED course: CT head and CTA head and neck  Chart review: Patient was brought to the ED on 07/13/2018 for left-sided weakness.  Apparently as of December 24th he was deconditioned but on Christmas day they noticed significant decrease in his strength in his leg.  CT at that time did show a right pontine infarct which is age indeterminant.  Stroke work-up was obtained.  Pertinent stroke work-up information includes: -CT head showed a right pontine infarct - MRI showed a right paramedian pontine infarct.  Left parietal cortex infarct - MRA showed no LVO -Carotid Doppler showed bilateral ICA 1-39%, vertebral arteries antegrade.  Greater than 50% ECA stenosis -2D echo showed EF of 60 to 65% with no source of emboli -LDL 153 -A1c 8.9 - At that time Eliquis was recommended for secondary stroke prevention  LKW: 1800 4.22.2020 tpa given?: no, on elquis and out of window Premorbid modified Rankin scale (mRS): 0 NIHSS: 5   ROS: A 14 point ROS was performed and is negative except as noted in the HPI.   Past Medical History:  Diagnosis Date  . Arrhythmia    post op Afib.  . Arthritis    "joints" (07/13/2018)  . Coronary artery disease     a. s/p CABG 12/2015.  Marland Kitchen CVA (cerebral vascular accident) ([place]) 07/13/2018   right pontine infarct; "said he's had one in the past; probably 2 yrs ago; same symptoms" (07/13/2018)  . Diabetic peripheral neuropathy ([place])   . GERD (gastroesophageal reflux disease)   . Hyperlipidemia   . Hypertension   . Postoperative atrial fibrillation ([place])   . Prostate cancer ([place])    d'x 2015/2016  . TIA (transient ischemic attack)   . Type II diabetes mellitus ([facility])     Family History  Problem Relation Age of Onset  . Heart disease Mother   . Breast cancer Mother   . Leukemia Father   . Diabetes Brother   . Breast cancer Sister   . Stroke Neg Hx     Social History:   reports that he quit smoking about 3 months ago. He has a 7.80 pack-year smoking history. He has never used smokeless tobacco. He reports previous alcohol use. He reports that he does not use drugs.  Medications No current facility-administered medications for this encounter.   Current Outpatient Medications:  .  acetaminophen (TYLENOL) 325 MG tablet, Take 1-2 tablets (325-650 mg total) by mouth every 4 (four) hours as needed for mild pain., Disp: , Rfl:  .  amLODipine (NORVASC) 2.5 MG tablet, Take 1 tablet (2.5 mg total) by mouth daily., Disp: 30 tablet, Rfl: 0 .  apixaban (ELIQUIS) 5 MG TABS tablet, Take 1 tablet (5 mg total) by mouth 2 (two)  times daily., Disp: 180 tablet, Rfl: 1 .  Evolocumab (REPATHA SURECLICK) 914 MG/ML SOAJ, Inject 140 mg into the skin every 14 (fourteen) days., Disp: 2 pen, Rfl: 11 .  gabapentin (NEURONTIN) 600 MG tablet, Take 600 mg by mouth at bedtime. , Disp: , Rfl: 1 .  Insulin Isophane & Regular Human (NOVOLIN 70/30 FLEXPEN) (70-30) 100 UNIT/ML PEN, Inject 5-25 Units into the skin 3 (three) times daily. Taking30 units in the Am and10 units Midday, and 20 units before dinner., Disp: , Rfl:  .  metoprolol tartrate (LOPRESSOR) 25 MG tablet, TAKE 1/2 (ONE-HALF) TABLET BY MOUTH TWICE DAILY, Disp: 90  tablet, Rfl: 3 .  omeprazole (PRILOSEC) 20 MG capsule, Take 20 mg by mouth daily., Disp: , Rfl:    Exam: Current vital signs: Temp 98.8 F (37.1 C) (Oral)  Vital signs in last 24 hours: Temp:  [98.8 F (37.1 C)] 98.8 F (37.1 C) (04/23 0817)  Physical Exam  Constitutional: Appears well-developed and well-nourished.  Psych: Affect appropriate to situation Eyes: No scleral injection HENT: No OP obstrucion Head: Normocephalic.  Cardiovascular: Normal rate and regular rhythm.  Respiratory: Effort normal, non-labored breathing GI: Soft.  No distension. There is no tenderness.  Skin: WDI  Neuro: Mental Status: Patient is awake, alert, oriented to person, place, month, year, and situation. Patient is able to give a clear and coherent history. No signs of aphasia or neglect but positive dysarthria Cranial Nerves: II: Visual Fields are full.  III,IV, VI: EOMI without ptosis or diploplia. Pupils equal, round and reactive to light V: Facial sensation is symmetric to temperature VII: Facial movement is symmetric but mild left facial droop VIII: hearing is intact to voice X: Palat elevates symmetrically XI: Shoulder shrug is symmetric. XII: tongue is midline without atrophy or fasciculations.  Motor: Right arm and leg with 5/5 left arm and leg is 4/5.  It is noticeable that on the arm the distal is weaker than the proximal.  On the leg distal is weaker than proximal Sensory: Sensation is symmetric to light touch and temperature in the arms and legs.  Normal sent station to [facility] Deep Tendon Reflexes: 2+ and symmetric in the biceps no patella nor ankle jerk Plantars: Mute bilaterally Cerebellar: Left side show dysmetria with the arm and leg secondary to strength right side showed no dysmetria  Labs I have reviewed labs in epic and the results pertinent to this consultation are:   CBC    Component Value Date/Time   WBC 7.2 07/17/2018 0655   RBC 5.07 07/17/2018 0655   HGB 15.3  07/17/2018 0655   HCT 44.4 07/17/2018 0655   PLT 192 07/17/2018 0655   MCV 87.6 07/17/2018 0655   MCH 30.2 07/17/2018 0655   MCHC 34.5 07/17/2018 0655   RDW 12.3 07/17/2018 0655   LYMPHSABS 1.6 07/17/2018 0655   MONOABS 0.9 07/17/2018 0655   EOSABS 0.3 07/17/2018 0655   BASOSABS 0.1 07/17/2018 0655    CMP     Component Value Date/Time   NA 138 07/21/2018 0606   K 4.0 07/21/2018 0606   CL 106 07/21/2018 0606   CO2 26 07/21/2018 0606   GLUCOSE 114 (H) 07/21/2018 0606   BUN 14 07/21/2018 0606   CREATININE 1.21 07/21/2018 0606   CALCIUM 9.1 07/21/2018 0606   PROT 6.2 (L) 07/17/2018 0655   ALBUMIN 3.2 (L) 07/17/2018 0655   AST 35 07/17/2018 0655   ALT 30 07/17/2018 0655   ALKPHOS 38 07/17/2018 0655   BILITOT 1.8 (  H) 07/17/2018 0655   GFRNONAA 56 (L) 07/21/2018 0606   GFRAA >60 07/21/2018 0606    Lipid Panel     Component Value Date/Time   CHOL 211 (H) 07/14/2018 0013   TRIG 139 07/14/2018 0013   HDL 30 (L) 07/14/2018 0013   CHOLHDL 7.0 07/14/2018 0013   VLDL 28 07/14/2018 0013   LDLCALC 153 (H) 07/14/2018 0013     Imaging I have reviewed the images obtained:  CT-scan of the brain- no acute cortically based infarct or acute intracranial hemorrhage identified.  Expected evolution of the right pontine infarct since December  CTA of head and neck- negative for emergent large vessel occlusion.  Widespread intracranial atherosclerosis which appears largely stable since December's MRA.  Possible increased moderate to severe right PCA P2 stenosis.  Chronic severe left PCA origin stenosis.  Up to moderate bilateral ICA siphon stenosis.  MRI examination of the brain--pending  Etta Quill PA-C [facility] [phone]  M-F  (9:00 am- 5:00 PM)  11/05/2018, 8:31 AM     Attending addendum Patient seen and examined as an acute code stroke for left-sided weakness.  He has a history of right paramedian pontine stroke in December with resolved residual deficits  according to him.  Sudden onset of left facial droop, mild dysarthria and left-sided weakness started yesterday around 530 or 6 PM.  Initially family wanted to bring him to the hospital but he refused.  This morning, symptoms persisted which led to the EMS call and being brought for evaluation to the ER emergently.  EMS paged out as a LVO code stroke but on examination as above, no cortical signs. Not a candidate for IV TPA due to being outside the window No emergent LVO on CTA and no signs of LVO on exam-hence not a EVT candidate. Likely small vessel stroke.  I have independently reviewed imaging. Noncontrast CT of the head-aspects 10, no bleed CTA head and neck with arteriosclerosis and no LVO.  I have independently examined patient, agree with documentation of exam above and have elicited key points of history independently myself with the patient.  Assessment:  84 year old male with prior history of pontine stroke which left him with weakness on the left side initially.  Patient denies having any left-sided weakness after PT.   Today patient comes in out of the window for TPA with no LVO for worsening left sided weakness.   Exam does show a left facial droop along with left-sided weakness.  In addition patient is on Eliquis at this time for stroke prevention.  In addition patient has significant stroke risk factors such as LDL of 153 and currently is hypertensive at 206/88.   Given his hypertension and atherosclerosis this most likely is a small vessel stroke most likely in the right pontine region versus internal capsule.  Impression: -Stroke -Hyperlipidemia -Diabetes  Recommend # MRI of the brain without contrast #Transthoracic Echo, may need TEE and loop monitor  #Continue Eliquis #As patient appears to be allergic to statins he will need other form of LDL lowering medication # BP goal: permissive HTN upto 220/120 mmHg # HBAIC and Lipid profile # Telemetry monitoring # Frequent  neuro checks # NPO until passes stroke swallow screen #If HbA1c is elevated will need further diabetic control per primary team # please page stroke NP  Or  PA  Or MD from 8am -4 pm  as this patient from this time will be  followed by the stroke.   You can look them  up on [email or web address]  Password [email or web address]   -- Amie Portland, MD [facility] Pager: [phone] If 7pm to 7am, please call on call as listed on AMION.

## 2018-11-05 NOTE — ED Notes (Addendum)
Spoke with patient's daughter Ilona Sorrel given update for plan of care for patient to be admitted to hospital for Stroke. Has MRI, labs, possible TEE, permissive HTN and diabetic control pending today. Family given info to contact primary RN with questions/updates on patient status.

## 2018-11-05 NOTE — ED Provider Notes (Addendum)
[facility] EMERGENCY DEPARTMENT Provider Note   CSN: [id number] Arrival date & time: 11/05/18  1950    History   Chief Complaint Chief Complaint  Patient presents with   Code Stroke    HPI Andre Wolfe is a 84 y.o. male with history of CAD, diabetic neuropathy, hypertension, hyperlipidemia, postoperative A. fib, diabetes mellitus, and right pontine stroke in December with no residual deficits per the patient presents brought in by EMS as a code stroke for evaluation of acute onset, persistent left sided weakness and dysarthria beginning at around 6 PM yesterday.  He reports that he typically ambulates with the aid of a walker but has not been able to ambulate since his symptoms began.  He notes mild dysphagia as well.  Denies headache, numbness, syncope, lightheadedness, dizziness, fevers, vision changes, chest pain, shortness of breath, cough, abdominal pain, nausea, or vomiting.  No aggravating or alleviating factors noted.  Reports that he initially did not want to come to the emergency department but his wife convinced him to today as his symptoms persisted. Has not had his morning medications today.  Seen and evaluated by neurology upon presentation to the ED.     The history is provided by the patient.    Past Medical History:  Diagnosis Date   Arthritis    "joints" (07/13/2018)   Coronary artery disease    a. s/p CABG 12/2015.   CVA (cerebral vascular accident) ([place]) 07/13/2018   right pontine infarct; "said he's had one in the past; probably 2 yrs ago; same symptoms" (07/13/2018)   Diabetic peripheral neuropathy (HCC)    GERD (gastroesophageal reflux disease)    Hyperlipidemia    Hypertension    Postoperative atrial fibrillation (HCC)    Prostate cancer ([place])    d'x 2015/2016   TIA (transient ischemic attack)    Type II diabetes mellitus [facility])     Patient Active Problem List   Diagnosis Date Noted   Stroke ([place]) 11/05/2018    Bradycardia    Right pontine stroke ([place]) 07/16/2018   Acute ischemic stroke [facility])    Essential hypertension    Poorly controlled type 2 diabetes mellitus with peripheral neuropathy (HCC)    Tobacco abuse    PAF (paroxysmal atrial fibrillation) ([place])    Right pontine CVA ([place]) 07/13/2018   Hypercholesterolemia 05/04/2018    Past Surgical History:  Procedure Laterality Date   CARDIAC CATHETERIZATION  2017   "not completed; went straight to [facility]"   CATARACT EXTRACTION W/ INTRAOCULAR LENS  IMPLANT, BILATERAL Bilateral    CORONARY ARTERY BYPASS GRAFT  2017   "CABG X 4"   TONSILLECTOMY          Home Medications    Prior to Admission medications   Medication Sig Start Date End Date Taking? Authorizing Provider  acetaminophen (TYLENOL) 325 MG tablet Take 1-2 tablets (325-650 mg total) by mouth every 4 (four) hours as needed for mild pain. 07/22/18  Yes Love, Ivan Anchors, PA-C  amLODipine (NORVASC) 2.5 MG tablet Take 1 tablet (2.5 mg total) by mouth daily. 07/24/18 11/05/18 Yes Love, Ivan Anchors, PA-C  apixaban (ELIQUIS) 5 MG TABS tablet Take 1 tablet (5 mg total) by mouth 2 (two) times daily. 08/19/18  Yes Martinique, Peter M, MD  Evolocumab (REPATHA SURECLICK) 932 MG/ML SOAJ Inject 140 mg into the skin every 14 (fourteen) days. 06/26/18  Yes Martinique, Peter M, MD  gabapentin (NEURONTIN) 600 MG tablet Take 600 mg by mouth at bedtime.  11/20/17  Yes [provider]  Insulin Isophane & Regular Human (NOVOLIN 70/30 FLEXPEN) (70-30) 100 UNIT/ML PEN Inject 10-30 Units into the skin See admin instructions. Taking30 units in the Am and10 units Midday, and 20 units before dinner.   Yes [provider]  metoprolol tartrate (LOPRESSOR) 25 MG tablet TAKE 1/2 (ONE-HALF) TABLET BY MOUTH TWICE DAILY Patient taking differently: Take 12.5 mg by mouth 2 (two) times daily.  08/25/18  Yes Kilroy, Luke K, PA-C  omeprazole (PRILOSEC) 20 MG capsule Take 20 mg by mouth daily.   Yes [provider]    Family History Family History  Problem Relation Age of Onset   Heart disease Mother    Breast cancer Mother    Leukemia Father    Diabetes Brother    Breast cancer Sister    Stroke Neg Hx     Social History Social History   Tobacco Use   Smoking status: Former Smoker    Packs/day: 0.12    Years: 65.00    Pack years: 7.80    Last attempt to quit: 07/08/2018    Years since quitting: 0.3   Smokeless tobacco: Never Used  Substance Use Topics   Alcohol use: Not Currently   Drug use: Never     Allergies   Crestor [rosuvastatin calcium]; Lipitor [atorvastatin calcium]; Zetia [ezetimibe]; and Zocor [simvastatin]   Review of Systems Review of Systems  Constitutional: Negative for chills and fever.  HENT: Positive for trouble swallowing. Negative for drooling.   Eyes: Negative for photophobia and visual disturbance.  Respiratory: Negative for shortness of breath.   Cardiovascular: Negative for chest pain.  Gastrointestinal: Negative for abdominal pain, nausea and vomiting.  Neurological: Positive for weakness. Negative for dizziness, syncope, light-headedness, numbness and headaches.  All other systems reviewed and are negative.    Physical Exam Updated Vital Signs BP (!) 192/92 (BP Location: Right Arm)    Pulse 74    Temp 98.8 F (37.1 C) (Oral)    Resp 16    Wt 95.3 kg    SpO2 96%    BMI 28.48 kg/m   Physical Exam Vitals signs and nursing note reviewed.  Constitutional:      General: He is not in acute distress.    Appearance: He is well-developed.  HENT:     Head: Normocephalic and atraumatic.     Mouth/Throat:     Comments: Tolerating secretions without difficulty. Eyes:     General:        Right eye: No discharge.        Left eye: No discharge.     Extraocular Movements: Extraocular movements intact.     Conjunctiva/sclera: Conjunctivae normal.     Pupils: Pupils are equal, round, and reactive to light.  Neck:      Musculoskeletal: Normal range of motion and neck supple.     Vascular: No JVD.     Trachea: No tracheal deviation.  Cardiovascular:     Rate and Rhythm: Normal rate.     Comments: 2+ radial pulses bilaterally, 1+ DP/PT pulses bilaterally, no lower extremity edema. Pulmonary:     Effort: Pulmonary effort is normal.     Breath sounds: Normal breath sounds.     Comments: Median sternotomy scar noted, well-healed Abdominal:     General: There is no distension.     Palpations: Abdomen is soft.     Tenderness: There is no abdominal tenderness. There is no guarding or rebound.  Skin:    General:  Skin is warm and dry.     Findings: No erythema.  Neurological:     Mental Status: He is alert.     Cranial Nerves: Cranial nerve deficit present.     Motor: Weakness present.     Comments: Mildly dysarthric speech noted.  Mild left-sided facial droop noted.  Sensation intact to soft touch of upper and lower extremities.  He has objective weakness of the left upper and lower extremities as compared to the right but is able to elevate them against gravity independently for a few seconds.  Difficulty with finger-to-nose of the left upper extremity due to weakness.  Peripheral fields intact with vision testing.  Psychiatric:        Behavior: Behavior normal.      ED Treatments / Results  Labs (all labs ordered are listed, but only abnormal results are displayed) Labs Reviewed  PROTIME-INR - Abnormal; Notable for the following components:      Result Value   Prothrombin Time 16.1 (*)    INR 1.3 (*)    All other components within normal limits  COMPREHENSIVE METABOLIC PANEL - Abnormal; Notable for the following components:   Glucose, Bld 188 (*)    All other components within normal limits  CBG MONITORING, ED - Abnormal; Notable for the following components:   Glucose-Capillary 194 (*)    All other components within normal limits  ETHANOL  APTT  CBC  DIFFERENTIAL  RAPID URINE DRUG SCREEN,  HOSP PERFORMED  URINALYSIS, ROUTINE W REFLEX MICROSCOPIC  I-STAT CREATININE, ED    EKG None  Radiology Ct Angio Head W Or Wo Contrast  Result Date: 11/05/2018 CLINICAL DATA:  Code stroke.  84 year old male. EXAM: CT ANGIOGRAPHY HEAD AND NECK TECHNIQUE: Multidetector CT imaging of the head and neck was performed using the standard protocol during bolus administration of intravenous contrast. Multiplanar CT image reconstructions and MIPs were obtained to evaluate the vascular anatomy. Carotid stenosis measurements (when applicable) are obtained utilizing NASCET criteria, using the distal internal carotid diameter as the denominator. CONTRAST:  70mL OMNIPAQUE IOHEXOL 350 MG/ML SOLN COMPARISON:  Head CT without contrast 0824 hours today. MRI and intracranial MRA 07/13/2018. FINDINGS: CTA NECK Skeleton: Prior sternotomy. No acute osseous abnormality identified. Upper chest: Centrilobular emphysema. No superior mediastinal lymphadenopathy. Other neck: Mild thyroid goiter on the right. Small 12 millimeter left parotid gland nodule is stable since December (series 9, image 163) and head hypercellular features on MRI. Other neck soft tissues are within normal limits. Aortic arch: Calcified aortic atherosclerosis. 3 vessel arch configuration. Right carotid system: No brachiocephalic or right CCA origin stenosis despite plaque. Soft and calcified plaque at the right ICA origin and bulb with less than 50 % stenosis with respect to the distal vessel. Left carotid system: No left CCA origin stenosis. Mildly tortuous left CCA. Soft and calcified plaque at the left ICA origin and bulb with less than 50 % stenosis with respect to the distal vessel. Vertebral arteries: Tortuous proximal right subclavian artery with soft and calcified plaque but no stenosis. Normal right vertebral artery origin. Patent right vertebral artery to the skull base without stenosis. Mildly tortuous proximal left subclavian artery with soft and  calcified plaque but no stenosis. Patent left vertebral artery origin without stenosis. Mild left V1 calcified plaque and tortuosity. Left V2 segment irregularity intermittently throughout the neck suggesting multifocal soft plaque, but no hemodynamically significant stenosis identified to the skull base. CTA HEAD Posterior circulation: Codominant distal vertebral arteries without stenosis. Patent vertebrobasilar  junction and PICA origins. Patent basilar artery without stenosis. Normal SCA and right PCA origins. Moderate to severe irregularity and stenosis redemonstrated at the left PCA origin and stable from the prior MRA. The left P2 segment is mildly irregular but the other left PCA branches are within normal limits. There is contralateral moderate to severe right P2 segment stenosis which appears progressed since December, but with preserved distal right PCA enhancement. Anterior circulation: Both ICA siphons are patent. Extensive left siphon plaque including bulky calcified plaque in the cavernous and proximal supraclinoid segments and soft plaque in the petrous segment. Mild to moderate subsequent left ICA stenosis appears stable since December. Normal left ophthalmic artery origin. Posterior communicating arteries are diminutive or absent. Right ICA siphon similar soft and calcified plaque with mild to moderate stenosis stable since December. Normal left ophthalmic artery origin. Patent carotid termini. Dominant left and diminutive or absent right ACA A1 segments. Normal left A1, anterior communicating artery and bilateral ACA branches. Left MCA M1 segment and trifurcation are patent with mild irregularity that appears stable since December. Left MCA branches are stable. Right MCA M1 and bifurcation are patent and appear stable since December. Right MCA branches appear stable. Venous sinuses: Not well evaluated due to early contrast timing Anatomic variants: Dominant left ACA A1. Bovine arch configuration.  Review of the MIP images confirms the above findings IMPRESSION: 1. Negative for emergent large vessel occlusion. 2. Widespread intracranial atherosclerosis which appears largely stable since the December MRA. - possible increased moderate to severe Right PCA P2 stenosis. - chronic severe Left PCA origin stenosis. - up to moderate bilateral ICA siphon stenosis. 3. Atherosclerosis in the neck and Aortic Atherosclerosis (ICD10-I70.0). No significant cervical carotid or vertebral artery stenosis identified. 4. 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 Recommend outpatient ENT follow-up. Salient findings communicated to Dr. Rory Percy at [phone] hours on 11/05/2018 by text page via the [facility] messaging system. Electronically Signed   By: Genevie Ann M.D.   On: 11/05/2018 08:50   Ct Angio Neck W Or Wo Contrast  Result Date: 11/05/2018 CLINICAL DATA:  Code stroke.  84 year old male. EXAM: CT ANGIOGRAPHY HEAD AND NECK TECHNIQUE: Multidetector CT imaging of the head and neck was performed using the standard protocol during bolus administration of intravenous contrast. Multiplanar CT image reconstructions and MIPs were obtained to evaluate the vascular anatomy. Carotid stenosis measurements (when applicable) are obtained utilizing NASCET criteria, using the distal internal carotid diameter as the denominator. CONTRAST:  12mL OMNIPAQUE IOHEXOL 350 MG/ML SOLN COMPARISON:  Head CT without contrast 0824 hours today. MRI and intracranial MRA 07/13/2018. FINDINGS: CTA NECK Skeleton: Prior sternotomy. No acute osseous abnormality identified. Upper chest: Centrilobular emphysema. No superior mediastinal lymphadenopathy. Other neck: Mild thyroid goiter on the right. Small 12 millimeter left parotid gland nodule is stable since December (series 9, image 163) and head hypercellular features on MRI. Other neck soft tissues are within normal limits. Aortic arch: Calcified aortic atherosclerosis. 3  vessel arch configuration. Right carotid system: No brachiocephalic or right CCA origin stenosis despite plaque. Soft and calcified plaque at the right ICA origin and bulb with less than 50 % stenosis with respect to the distal vessel. Left carotid system: No left CCA origin stenosis. Mildly tortuous left CCA. Soft and calcified plaque at the left ICA origin and bulb with less than 50 % stenosis with respect to the distal vessel. Vertebral arteries: Tortuous proximal right subclavian artery with soft and  calcified plaque but no stenosis. Normal right vertebral artery origin. Patent right vertebral artery to the skull base without stenosis. Mildly tortuous proximal left subclavian artery with soft and calcified plaque but no stenosis. Patent left vertebral artery origin without stenosis. Mild left V1 calcified plaque and tortuosity. Left V2 segment irregularity intermittently throughout the neck suggesting multifocal soft plaque, but no hemodynamically significant stenosis identified to the skull base. CTA HEAD Posterior circulation: Codominant distal vertebral arteries without stenosis. Patent vertebrobasilar junction and PICA origins. Patent basilar artery without stenosis. Normal SCA and right PCA origins. Moderate to severe irregularity and stenosis redemonstrated at the left PCA origin and stable from the prior MRA. The left P2 segment is mildly irregular but the other left PCA branches are within normal limits. There is contralateral moderate to severe right P2 segment stenosis which appears progressed since December, but with preserved distal right PCA enhancement. Anterior circulation: Both ICA siphons are patent. Extensive left siphon plaque including bulky calcified plaque in the cavernous and proximal supraclinoid segments and soft plaque in the petrous segment. Mild to moderate subsequent left ICA stenosis appears stable since December. Normal left ophthalmic artery origin. Posterior communicating  arteries are diminutive or absent. Right ICA siphon similar soft and calcified plaque with mild to moderate stenosis stable since December. Normal left ophthalmic artery origin. Patent carotid termini. Dominant left and diminutive or absent right ACA A1 segments. Normal left A1, anterior communicating artery and bilateral ACA branches. Left MCA M1 segment and trifurcation are patent with mild irregularity that appears stable since December. Left MCA branches are stable. Right MCA M1 and bifurcation are patent and appear stable since December. Right MCA branches appear stable. Venous sinuses: Not well evaluated due to early contrast timing Anatomic variants: Dominant left ACA A1. Bovine arch configuration. Review of the MIP images confirms the above findings IMPRESSION: 1. Negative for emergent large vessel occlusion. 2. Widespread intracranial atherosclerosis which appears largely stable since the December MRA. - possible increased moderate to severe Right PCA P2 stenosis. - chronic severe Left PCA origin stenosis. - up to moderate bilateral ICA siphon stenosis. 3. Atherosclerosis in the neck and Aortic Atherosclerosis (ICD10-I70.0). No significant cervical carotid or vertebral artery stenosis identified. 4. 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 Recommend outpatient ENT follow-up. Salient findings communicated to Dr. Rory Percy at [phone] hours on 11/05/2018 by text page via the [facility] messaging system. Electronically Signed   By: Genevie Ann M.D.   On: 11/05/2018 08:50   Ct Head Code Stroke Wo Contrast  Result Date: 11/05/2018 CLINICAL DATA:  Code stroke. 84 year old male. History of small vessel infarcts in 2019. EXAM: CT HEAD WITHOUT CONTRAST TECHNIQUE: Contiguous axial images were obtained from the base of the skull through the vertex without intravenous contrast. COMPARISON:  Brain MRI and head CT 07/13/2018 FINDINGS: Brain: Stable cerebral volume. No midline shift,  mass effect, or evidence of intracranial mass lesion. No acute intracranial hemorrhage identified. No ventriculomegaly. Evolved right pontine infarct seen in December. Patchy bilateral cerebral white matter hypodensity. Asymmetric left occipital lobe sulcus or less likely chronic encephalomalacia is stable. Stable gray-white matter differentiation throughout the brain. No cortically based acute infarct identified. Vascular: Calcified atherosclerosis at the skull base. No suspicious intracranial vascular hyperdensity. Skull: No acute osseous abnormality identified. Sinuses/Orbits: Visualized paranasal sinuses and mastoids are stable and well pneumatized. Other: No acute orbit or scalp soft tissue finding. ASPECTS [facility] Stroke Program Early CT Score) - Ganglionic  level infarction (caudate, lentiform nuclei, internal capsule, insula, M1-M3 cortex): 7 - Supraganglionic infarction (M4-M6 cortex): 3 Total score (0-10 with 10 being normal): 10 IMPRESSION: 1. No acute cortically based infarct or acute intracranial hemorrhage identified. ASPECTS is 10. 2. Expected evolution of right pontine infarct since December. Chronic white matter disease. 3. These results were communicated to Dr. Rory Percy at 8:32 am on 4/23/2020by text page via the [facility] messaging system. Electronically Signed   By: Genevie Ann M.D.   On: 11/05/2018 08:33    Procedures Procedures (including critical care time)  Medications Ordered in ED Medications  iohexol (OMNIPAQUE) 350 MG/ML injection 100 mL (60 mLs Intravenous Contrast Given 11/05/18 0829)     Initial Impression / Assessment and Plan / ED Course  I have reviewed the triage vital signs and the nursing notes.  Pertinent labs & imaging results that were available during my care of the patient were reviewed by me and considered in my medical decision making (see chart for details).        Patient presenting for evaluation of left-sided weakness beginning yesterday.  He was brought in by  EMS as a code stroke found to be VAN negative and outside of the window for TPA.  He is afebrile, hypertensive in the ED but has not had any of his home medications this morning.  He exhibits persistent left-sided weakness on examination.  Patient was seen and evaluated by neurology emergently in the ED.  His noncontrast head CT shows no acute infarct.  No evidence of hemorrhagic stroke. Concern for small vessel ischemia.  Neurology recommends MRI brain and hospitalist admission for further work-up.  Lab work reviewed by me shows no leukocytosis, no anemia, no metabolic derangements, no renal insufficiency. Spoke with Dr. Lorin Mercy with [facility] hospitalist service who agrees to assume care of patient and bring him to the hospital for further evaluation and management.  Final Clinical Impressions(s) / ED Diagnoses   Final diagnoses:  Left-sided weakness    ED Discharge Orders    None          Renita Papa, PA-C 11/05/18 1058    Duffy Bruce, MD 11/06/18 [phone]

## 2018-11-05 NOTE — Progress Notes (Signed)
Initial Nutrition Assessment   RD working remotely.  DOCUMENTATION CODES:   Not applicable  INTERVENTION:  Provide Ensure Enlive po BID, each supplement provides 350 kcal and 20 grams of protein  NUTRITION DIAGNOSIS:   Increased nutrient needs related to chronic illness(CHF) as evidenced by estimated needs.  GOAL:   Patient will meet greater than or equal to 90% of their needs  MONITOR:   PO intake, Supplement acceptance, Labs, I & O's, Skin, Weight trends  REASON FOR ASSESSMENT:   Consult (Stroke)  ASSESSMENT:   84 y.o. male with medical history significant of DM; prostate CA; afib on Eliquis; HTN; HLD; CHF, CVA in 12/19; and CAD s/p CABG presenting with code stroke. MRI confirms acute R pontine infarct.  RD unable to obtain nutrition history from attempt at contacting pt via inpatient room phone. RD to order nutritional supplements to aid in caloric and protein needs pending swallow evaluation. RD to continue to monitor.   Unable to complete Nutrition-Focused physical exam at this time.   Labs and medications reviewed.   Diet Order:   Diet Order            Diet heart healthy/carb modified Room service appropriate? Yes; Fluid consistency: Thin  Diet effective now              EDUCATION NEEDS:   Not appropriate for education at this time  Skin:  Skin Assessment: Reviewed RN Assessment  Last BM:  4/22  Height:   Ht Readings from Last 1 Encounters:  09/02/18 6' (1.829 m)    Weight:   Wt Readings from Last 1 Encounters:  11/05/18 95.3 kg    Ideal Body Weight:  80.9 kg  BMI:  Body mass index is 28.48 kg/m.  Estimated Nutritional Needs:   Kcal:  1900-2100  Protein:  90-105 grams  Fluid:  1.9 - 2.1 L/day    Corrin Parker, MS, RD, LDN Pager # [phone] After hours/ weekend pager # [phone]

## 2018-11-05 NOTE — ED Notes (Signed)
Yates, MD at bedside 

## 2018-11-06 DIAGNOSIS — I480 Paroxysmal atrial fibrillation: Secondary | ICD-10-CM

## 2018-11-06 DIAGNOSIS — E1142 Type 2 diabetes mellitus with diabetic polyneuropathy: Secondary | ICD-10-CM

## 2018-11-06 DIAGNOSIS — E7800 Pure hypercholesterolemia, unspecified: Secondary | ICD-10-CM

## 2018-11-06 DIAGNOSIS — E1165 Type 2 diabetes mellitus with hyperglycemia: Secondary | ICD-10-CM

## 2018-11-06 DIAGNOSIS — R531 Weakness: Secondary | ICD-10-CM

## 2018-11-06 LAB — BASIC METABOLIC PANEL
Anion gap: 11 (ref 5–15)
BUN: 16 mg/dL (ref 8–23)
CO2: 24 mmol/L (ref 22–32)
Calcium: 9.9 mg/dL (ref 8.9–10.3)
Chloride: 101 mmol/L (ref 98–111)
Creatinine, Ser: 1.11 mg/dL (ref 0.61–1.24)
GFR calc Af Amer: >60 mL/min (ref >60)
GFR calc non Af Amer: >60 mL/min (ref >60)
Glucose, Bld: 278 mg/dL — ABNORMAL HIGH (ref 70–99)
Potassium: 3.9 mmol/L (ref 3.5–5.1)
Sodium: 136 mmol/L (ref 135–145)

## 2018-11-06 LAB — GLUCOSE, CAPILLARY
Glucose-Capillary: 132 mg/dL — ABNORMAL HIGH (ref 70–99)
Glucose-Capillary: 248 mg/dL — ABNORMAL HIGH (ref 70–99)
Glucose-Capillary: 107 mg/dL — ABNORMAL HIGH (ref 70–99)
Glucose-Capillary: 151 mg/dL — ABNORMAL HIGH (ref 70–99)

## 2018-11-06 LAB — LIPID PANEL
Cholesterol: 100 mg/dL (ref 0–200)
HDL: 34 mg/dL — ABNORMAL LOW (ref >40)
LDL Cholesterol: 40 mg/dL (ref 0–99)
Total CHOL/HDL Ratio: 2.9 RATIO
Triglycerides: 130 mg/dL (ref <150)
VLDL: 26 mg/dL (ref 0–40)

## 2018-11-06 MED ORDER — FLUOXETINE HCL 20 MG PO CAPS
20.0000 mg | ORAL_CAPSULE | Freq: Every day | ORAL | Status: DC
  Administered 2018-11-06 – 2018-11-09 (×4): 20 mg via ORAL
  Filled 2018-11-06 (×4): qty 1

## 2018-11-06 NOTE — Progress Notes (Addendum)
STROKE TEAM PROGRESS NOTE   HISTORY OF PRESENT ILLNESS (per record) Andre Wolfe is a 84 y.o. male arrives to ER as code stroke with left facial droop, dysarthria, left-sided weakness. Unfortunately symptoms started the day before, family wanted to bring him to the hospital however, he refused d/t COVID19. When symptoms persisted the following day, he finally come to the hospital. No acute stroke interventions available d/t outside of time window.  SUBJECTIVE (INTERVAL HISTORY) He is tearful and tells me he misses his family. I offered to use ipad to facetime with them for update, but he declined this. He says since previous stroke he has been doing everything we asked him to do, including injections for lowering CHL since he has an allergy to statins.He still has left facial droop/left side weakness on exam.   OBJECTIVE Vitals:   11/06/18 0100 11/06/18 0450 11/06/18 0814 11/06/18 1152  BP: (!) 156/62 (!) 163/75 (!) 185/52 (!) 140/51  Pulse: (!) 51 (!) 55 63 64  Resp: 20 20 16 17   Temp: 98.4 F (36.9 C) 98.1 F (36.7 C) 98.5 F (36.9 C) 98.2 F (36.8 C)  TempSrc: Oral Oral Oral Oral  SpO2: 95% 98%  97%  Weight:        CBC:  Recent Labs  Lab 11/05/18 0819  WBC 7.4  NEUTROABS 4.9  HGB 15.9  HCT 46.4  MCV 86.2  PLT 009    Basic Metabolic Panel:  Recent Labs  Lab 11/05/18 0819 11/05/18 0831 11/06/18 1039  NA 137  --  136  K 4.8  --  3.9  CL 103  --  101  CO2 24  --  24  GLUCOSE 188*  --  278*  BUN 15  --  16  CREATININE 1.08 1.00 1.11  CALCIUM 9.3  --  9.9    Lipid Panel:     Component Value Date/Time   CHOL 100 11/06/2018 0319   TRIG 130 11/06/2018 0319   HDL 34 (L) 11/06/2018 0319   CHOLHDL 2.9 11/06/2018 0319   VLDL 26 11/06/2018 0319   LDLCALC 40 11/06/2018 0319   HgbA1c:  Lab Results  Component Value Date   HGBA1C 8.0 (H) 11/05/2018   Urine Drug Screen:     Component Value Date/Time   LABOPIA NONE DETECTED 07/14/2018 0754   COCAINSCRNUR NONE  DETECTED 07/14/2018 0754   LABBENZ NONE DETECTED 07/14/2018 0754   AMPHETMU NONE DETECTED 07/14/2018 0754   THCU NONE DETECTED 07/14/2018 0754   LABBARB NONE DETECTED 07/14/2018 0754    Alcohol Level     Component Value Date/Time   ETH <10 11/05/2018 0819    IMAGING   Ct Angio Head W Or Wo Contrast  Result Date: 11/05/2018 CLINICAL DATA:  Code stroke.  84 year old male. EXAM: CT ANGIOGRAPHY HEAD AND NECK TECHNIQUE: Multidetector CT imaging of the head and neck was performed using the standard protocol during bolus administration of intravenous contrast. Multiplanar CT image reconstructions and MIPs were obtained to evaluate the vascular anatomy. Carotid stenosis measurements (when applicable) are obtained utilizing NASCET criteria, using the distal internal carotid diameter as the denominator. CONTRAST:  26mL OMNIPAQUE IOHEXOL 350 MG/ML SOLN COMPARISON:  Head CT without contrast 0824 hours today. MRI and intracranial MRA 07/13/2018. FINDINGS: CTA NECK Skeleton: Prior sternotomy. No acute osseous abnormality identified. Upper chest: Centrilobular emphysema. No superior mediastinal lymphadenopathy. Other neck: Mild thyroid goiter on the right. Small 12 millimeter left parotid gland nodule is stable since December (series 9, image 163) and head  hypercellular features on MRI. Other neck soft tissues are within normal limits. Aortic arch: Calcified aortic atherosclerosis. 3 vessel arch configuration. Right carotid system: No brachiocephalic or right CCA origin stenosis despite plaque. Soft and calcified plaque at the right ICA origin and bulb with less than 50 % stenosis with respect to the distal vessel. Left carotid system: No left CCA origin stenosis. Mildly tortuous left CCA. Soft and calcified plaque at the left ICA origin and bulb with less than 50 % stenosis with respect to the distal vessel. Vertebral arteries: Tortuous proximal right subclavian artery with soft and calcified plaque but no  stenosis. Normal right vertebral artery origin. Patent right vertebral artery to the skull base without stenosis. Mildly tortuous proximal left subclavian artery with soft and calcified plaque but no stenosis. Patent left vertebral artery origin without stenosis. Mild left V1 calcified plaque and tortuosity. Left V2 segment irregularity intermittently throughout the neck suggesting multifocal soft plaque, but no hemodynamically significant stenosis identified to the skull base. CTA HEAD Posterior circulation: Codominant distal vertebral arteries without stenosis. Patent vertebrobasilar junction and PICA origins. Patent basilar artery without stenosis. Normal SCA and right PCA origins. Moderate to severe irregularity and stenosis redemonstrated at the left PCA origin and stable from the prior MRA. The left P2 segment is mildly irregular but the other left PCA branches are within normal limits. There is contralateral moderate to severe right P2 segment stenosis which appears progressed since December, but with preserved distal right PCA enhancement. Anterior circulation: Both ICA siphons are patent. Extensive left siphon plaque including bulky calcified plaque in the cavernous and proximal supraclinoid segments and soft plaque in the petrous segment. Mild to moderate subsequent left ICA stenosis appears stable since December. Normal left ophthalmic artery origin. Posterior communicating arteries are diminutive or absent. Right ICA siphon similar soft and calcified plaque with mild to moderate stenosis stable since December. Normal left ophthalmic artery origin. Patent carotid termini. Dominant left and diminutive or absent right ACA A1 segments. Normal left A1, anterior communicating artery and bilateral ACA branches. Left MCA M1 segment and trifurcation are patent with mild irregularity that appears stable since December. Left MCA branches are stable. Right MCA M1 and bifurcation are patent and appear stable since  December. Right MCA branches appear stable. Venous sinuses: Not well evaluated due to early contrast timing Anatomic variants: Dominant left ACA A1. Bovine arch configuration. Review of the MIP images confirms the above findings IMPRESSION: 1. Negative for emergent large vessel occlusion. 2. Widespread intracranial atherosclerosis which appears largely stable since the December MRA. - possible increased moderate to severe Right PCA P2 stenosis. - chronic severe Left PCA origin stenosis. - up to moderate bilateral ICA siphon stenosis. 3. Atherosclerosis in the neck and Aortic Atherosclerosis (ICD10-I70.0). No significant cervical carotid or vertebral artery stenosis identified. 4. 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 Recommend outpatient ENT follow-up. Salient findings communicated to Dr. Rory Percy at [phone] hours on 11/05/2018 by text page via the [facility] messaging system. Electronically Signed   By: Genevie Ann M.D.   On: 11/05/2018 08:50   Ct Angio Neck W Or Wo Contrast  Result Date: 11/05/2018 CLINICAL DATA:  Code stroke.  84 year old male. EXAM: CT ANGIOGRAPHY HEAD AND NECK TECHNIQUE: Multidetector CT imaging of the head and neck was performed using the standard protocol during bolus administration of intravenous contrast. Multiplanar CT image reconstructions and MIPs were obtained to evaluate the vascular anatomy. Carotid stenosis measurements (  when applicable) are obtained utilizing NASCET criteria, using the distal internal carotid diameter as the denominator. CONTRAST:  73mL OMNIPAQUE IOHEXOL 350 MG/ML SOLN COMPARISON:  Head CT without contrast 0824 hours today. MRI and intracranial MRA 07/13/2018. FINDINGS: CTA NECK Skeleton: Prior sternotomy. No acute osseous abnormality identified. Upper chest: Centrilobular emphysema. No superior mediastinal lymphadenopathy. Other neck: Mild thyroid goiter on the right. Small 12 millimeter left parotid gland nodule is stable  since December (series 9, image 163) and head hypercellular features on MRI. Other neck soft tissues are within normal limits. Aortic arch: Calcified aortic atherosclerosis. 3 vessel arch configuration. Right carotid system: No brachiocephalic or right CCA origin stenosis despite plaque. Soft and calcified plaque at the right ICA origin and bulb with less than 50 % stenosis with respect to the distal vessel. Left carotid system: No left CCA origin stenosis. Mildly tortuous left CCA. Soft and calcified plaque at the left ICA origin and bulb with less than 50 % stenosis with respect to the distal vessel. Vertebral arteries: Tortuous proximal right subclavian artery with soft and calcified plaque but no stenosis. Normal right vertebral artery origin. Patent right vertebral artery to the skull base without stenosis. Mildly tortuous proximal left subclavian artery with soft and calcified plaque but no stenosis. Patent left vertebral artery origin without stenosis. Mild left V1 calcified plaque and tortuosity. Left V2 segment irregularity intermittently throughout the neck suggesting multifocal soft plaque, but no hemodynamically significant stenosis identified to the skull base. CTA HEAD Posterior circulation: Codominant distal vertebral arteries without stenosis. Patent vertebrobasilar junction and PICA origins. Patent basilar artery without stenosis. Normal SCA and right PCA origins. Moderate to severe irregularity and stenosis redemonstrated at the left PCA origin and stable from the prior MRA. The left P2 segment is mildly irregular but the other left PCA branches are within normal limits. There is contralateral moderate to severe right P2 segment stenosis which appears progressed since December, but with preserved distal right PCA enhancement. Anterior circulation: Both ICA siphons are patent. Extensive left siphon plaque including bulky calcified plaque in the cavernous and proximal supraclinoid segments and soft  plaque in the petrous segment. Mild to moderate subsequent left ICA stenosis appears stable since December. Normal left ophthalmic artery origin. Posterior communicating arteries are diminutive or absent. Right ICA siphon similar soft and calcified plaque with mild to moderate stenosis stable since December. Normal left ophthalmic artery origin. Patent carotid termini. Dominant left and diminutive or absent right ACA A1 segments. Normal left A1, anterior communicating artery and bilateral ACA branches. Left MCA M1 segment and trifurcation are patent with mild irregularity that appears stable since December. Left MCA branches are stable. Right MCA M1 and bifurcation are patent and appear stable since December. Right MCA branches appear stable. Venous sinuses: Not well evaluated due to early contrast timing Anatomic variants: Dominant left ACA A1. Bovine arch configuration. Review of the MIP images confirms the above findings IMPRESSION: 1. Negative for emergent large vessel occlusion. 2. Widespread intracranial atherosclerosis which appears largely stable since the December MRA. - possible increased moderate to severe Right PCA P2 stenosis. - chronic severe Left PCA origin stenosis. - up to moderate bilateral ICA siphon stenosis. 3. Atherosclerosis in the neck and Aortic Atherosclerosis (ICD10-I70.0). No significant cervical carotid or vertebral artery stenosis identified. 4. 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 Recommend outpatient ENT follow-up. Salient findings communicated to Dr. Rory Percy at [phone] hours on 11/05/2018 by text page via  the [employer] system. Electronically Signed   By: Genevie Ann M.D.   On: 11/05/2018 08:50   Mr Brain Wo Contrast (neuro Protocol)  Result Date: 11/05/2018 CLINICAL DATA:  Left-sided weakness and dysarthria.  Prior stroke. EXAM: MRI HEAD WITHOUT CONTRAST TECHNIQUE: Multiplanar, multiecho pulse sequences of the brain and  surrounding structures were obtained without intravenous contrast. COMPARISON:  Head CT and CTA 11/05/2018 and MRI 07/13/2018 FINDINGS: Brain: There is a recurrent acute right pontine infarct, smaller than that on the prior MRI. The acute ischemia is located along the margins of the old infarct and predominantly posterior to it. Diffusion abnormality associated with the punctate left parietal infarct on the prior MRI has resolved. Patchy T2 hyperintensities in the cerebral white matter are unchanged and nonspecific but compatible with chronic small vessel ischemic disease, mildly advanced for age. Mild cerebral atrophy for age is also unchanged. No intracranial hemorrhage, mass, midline shift, or extra-axial fluid collection is identified. Vascular: Major intracranial vascular flow voids are preserved. Skull and upper cervical spine: Unremarkable bone marrow signal. Sinuses/Orbits: Bilateral cataract extraction. Mild left maxillary sinus mucosal thickening. Clear mastoid air cells. Other: None. IMPRESSION: 1. Recurrent acute right pontine infarct. 2. Mild chronic small vessel ischemic disease in the cerebral white matter. Electronically Signed   By: Logan Bores M.D.   On: 11/05/2018 11:50   Ct Head Code Stroke Wo Contrast  Result Date: 11/05/2018 CLINICAL DATA:  Code stroke. 84 year old male. History of small vessel infarcts in 2019. EXAM: CT HEAD WITHOUT CONTRAST TECHNIQUE: Contiguous axial images were obtained from the base of the skull through the vertex without intravenous contrast. COMPARISON:  Brain MRI and head CT 07/13/2018 FINDINGS: Brain: Stable cerebral volume. No midline shift, mass effect, or evidence of intracranial mass lesion. No acute intracranial hemorrhage identified. No ventriculomegaly. Evolved right pontine infarct seen in December. Patchy bilateral cerebral white matter hypodensity. Asymmetric left occipital lobe sulcus or less likely chronic encephalomalacia is stable. Stable gray-white  matter differentiation throughout the brain. No cortically based acute infarct identified. Vascular: Calcified atherosclerosis at the skull base. No suspicious intracranial vascular hyperdensity. Skull: No acute osseous abnormality identified. Sinuses/Orbits: Visualized paranasal sinuses and mastoids are stable and well pneumatized. Other: No acute orbit or scalp soft tissue finding. ASPECTS [facility] Stroke Program Early CT Score) - Ganglionic level infarction (caudate, lentiform nuclei, internal capsule, insula, M1-M3 cortex): 7 - Supraganglionic infarction (M4-M6 cortex): 3 Total score (0-10 with 10 being normal): 10 IMPRESSION: 1. No acute cortically based infarct or acute intracranial hemorrhage identified. ASPECTS is 10. 2. Expected evolution of right pontine infarct since December. Chronic white matter disease. 3. These results were communicated to Dr. Rory Percy at 8:32 am on 4/23/2020by text page via the [facility] messaging system. Electronically Signed   By: Genevie Ann M.D.   On: 11/05/2018 08:33     Transthoracic Echocardiogram  00/00/2020 Pending  PHYSICAL EXAM Blood pressure (!) 140/51, pulse 64, temperature 98.2 F (36.8 C), temperature source Oral, resp. rate 17, weight 95.3 kg, SpO2 97 %.  Gen: Appears well-developed and well-nourished. Mild distress Psych:He is emotionally distressed; crying Eyes: No scleral injection Head: Normocephalic.  Cardiovascular: Normal rate and regular rhythm.  Respiratory: Effort normal, non-labored breathing GI: Soft.  No distension. There is no tenderness.  Skin: WDI  Neuro: Mental Status: Patient is awake, alert, oriented to person, place, month, year, and situation. Patient is able to give a clear and coherent history. Slight dysarthria noted. No signs of aphasia or neglect  Cranial  Nerves: II: Visual Fields are full.  III,IV, VI: EOMI without ptosis or diploplia. Pupils equal, round and reactive to light V: Facial sensation is symmetric  VII: Facial  movement is symmetric but mild lower left facial droop VIII: hearing is intact to voice X: Palat elevates symmetrically XI: Shoulder shrug is symmetric. XII: tongue is midline Motor: Right arm and leg with 5/5 left arm and leg is 4/5.  It is noticeable that on the arm the distal is stronger than the proximal. Sensory: Sensation is symmetric to light touch and temperature in the arms and legs.  Normal sent station to DSS Deep Tendon Reflexes: 2+ and symmetric in the biceps no patella nor ankle jerk Plantars: Mute bilaterally Cerebellar: Left side show dysmetria with the arm and leg secondary to strength right side showed no dysmetria   ASSESSMENT/PLAN Mr. TIAGO HUMPHREY is a 84 y.o. male with history of right pontine stroke recently in Dec 2019, HTN, type 2 diabetes, TIA, hyperlipidemia, CAD and atrial fibrillation on anticoagulation with last dose of Eliquis on 4/22. Presenting with left side weakness, facial droop. He did not receive IV t-PA due to outside of time window.   Stroke: right pontine infarct, likely small vessel disease.  Resultant left facial droop and left hemiparesis  CT head- chronic right pontine ifacrt  MRI head -new right paramedian pontine infarct  CTA head and neck right P2 and left P1 stenosis, bilateral siphon atherosclerosis.  2D Echo EF 60 to 65% in 06/2018  LDL 40  HgbA1c 8.0  VTE prophylaxis - anticoagulated with Eliquis  Diet SLP cleared for heart healthy diet  Eliquis prior to admission, now on ASA 81 + Eliquis.  Continue both on discharge.  Patient counseled to be compliant with his antithrombotic medications  Ongoing aggressive stroke risk factor management  Therapy recommendations:  pending  Disposition:  Pending  Hx of stroke  06/2018 MRI showed right pontine and left parietal punctate infarct.  MRA no LVO, mild to moderate intracranial atherosclerosis.  Carotid Doppler negative.  EF 60 to 65%.  LDL 153 and A1c 8.9.  Patient  discharged with Eliquis.  He is also getting outpatient Repatha.  A. fib on Eliquis  Postop A. Fib  Treated with Eliquis since last stroke  Rate controlled  Continue Eliquis  Add baby aspirin on top of the Eliquis for stroke prevention  Hypertension  Stable . Permissive hypertension (OK if < 220/120) but gradually normalize in 3-5 days . Long-term BP goal normotensive  Hyperlipidemia  Lipid lowering medication PTA:  Repatha injections (just started 64mo ago per pt)  LDL 40, goal < 70  Continue outpatient Repatha  Allergy to statins  Diabetes  HgbA1c 8.0, goal < 7.0  Uncontrolled  SSI  CBG monitoring  Close PCP follow-up and better control DM  Post stroke depression  Pt has been very compliant with all his f/u and medications since previous stroke in Dec 2019   depressed and tearful  Will start Prozac for both depression and motor recovery  Other Stroke Risk Factors  Advanced age  Family hx stroke   Coronary artery disease  Hospital day # 1 Desiree Metzger-Cihelka, ARNP-C, ANVP-BC  ATTENDING NOTE: I reviewed above note and agree with the assessment and plan. Pt was seen and examined.   84 year old male with history of postop A. fib on Eliquis, CAD status post CABG in 2017, DM, HTN, HLD, prostate cancer, smoker admitted for left facial droop, slurred speech and left-sided weakness.  He had  a stroke in 06/2018 MRI showed right pontine and left parietal punctate infarct.  Embolic pattern, concerning for A. fib.  MRA no LVO but intracranial atherosclerosis.  Carotid Doppler negative EF 60 to 65%.  LDL 153 and A1c 8.9.  He was discharged with Eliquis and Repatha.  He stated he compliant with medication and recently got Repatha.  On exam patient awake alert, fully orientated, mild dysarthric, no aphasia, able to name and repeat.  Visual field full, PERRLA, EOMI, left facial mildly droop, tongue midline.  Left upper extremity 3/5 distal and proximal, left  lower extremity 4/5 proximal and distal.  Right upper and lower extremity 5/5.  Sensation symmetrical, coronation intact and right finger-to-nose.  Gait not tested.  This time CT no acute abnormality.  CTA head and neck right P2 and left P1 stenosis as well as bilateral siphon atherosclerosis.  MRI showed right pontine infarct.  A1c 8.0 and LDL 40.   Recommend to continue Eliquis, we will add baby aspirin on top of Eliquis for stroke prevention.  Continue outpatient Repatha treatment for HLD.  Patient has sign of post stroke depression, put on Prozac for depression and motor recovery. BP still on the higher end, resumed home medication.  Recommend to gradually normalize BP in 2 to 3 days.  PT/OT and stroke risk factor modification.  Neurology will sign off. Please call with questions. Pt will follow up with stroke clinic NP at [facility] as scheduled on 12/14/2018. Thanks for the consult.  Rosalin Hawking, MD PhD Stroke Neurology 11/06/2018 4:55 PM     To contact Stroke Continuity provider, please refer to [email or web address] After hours, contact General Neurology

## 2018-11-06 NOTE — Evaluation (Signed)
Speech Language Pathology Evaluation Patient Details Name: Andre Wolfe MRN: [id number] DOB: [birth year] Today's Date: 11/06/2018 Time: 4431-5400 SLP Time Calculation (min) (ACUTE ONLY): 25 min  Problem List:  Patient Active Problem List   Diagnosis Date Noted  . Bradycardia   . Right pontine stroke ([place]) 07/16/2018  . Acute ischemic stroke ([place])   . Essential hypertension   . Poorly controlled type 2 diabetes mellitus with peripheral neuropathy ([place])   . Tobacco abuse   . PAF (paroxysmal atrial fibrillation) ([place])   . Right pontine CVA ([place]) 07/13/2018  . Hypercholesterolemia 05/04/2018   Past Medical History:  Past Medical History:  Diagnosis Date  . Arthritis    "joints" (07/13/2018)  . Coronary artery disease    a. s/p CABG 12/2015.  Marland Kitchen CVA (cerebral vascular accident) ([place]) 07/13/2018   right pontine infarct; "said he's had one in the past; probably 2 yrs ago; same symptoms" (07/13/2018)  . Diabetic peripheral neuropathy ([place])   . GERD (gastroesophageal reflux disease)   . Hyperlipidemia   . Hypertension   . Postoperative atrial fibrillation ([place])   . Prostate cancer ([place])    d'x 2015/2016  . TIA (transient ischemic attack)   . Type II diabetes mellitus ([place])    Past Surgical History:  Past Surgical History:  Procedure Laterality Date  . CARDIAC CATHETERIZATION  2017   "not completed; went straight to [facility]"  . CATARACT EXTRACTION W/ INTRAOCULAR LENS  IMPLANT, BILATERAL Bilateral   . CORONARY ARTERY BYPASS GRAFT  2017   "CABG X 4"  . TONSILLECTOMY     HPI:  Pt is an 84 y.o. male with medical history significant of DM; prostate CA; afib on Eliquis; HTN; HLD; CVA in 12/19; and CAD s/p CABG who presented with code stroke after losing the use of his left arm and leg the night prior to admission. MRI of the brain showed recurrent acute right pontine infarct.   Assessment / Plan / Recommendation Clinical Impression  Pt is a retired Psychologist, sport and exercise and he reported that he was  living independently with his wife prior to admission. He denied any baseline deficits in speech or language. He also initially denied any baseline deficits in cognition but subsequently indicated that he uses external memory aids due to do some difficulty with memory. Per the pt, his wife manages his medications but only "because she wants to" and not due to his difficulty with this task. Some difficulty was noted in the area of memory and complex problem solving but his language skills were within normal limits. He presented with mild to moderate dysarthria characterized by reduced articulatory precision secondary to oral motor weakness which the pt indicated is approximately 80% back to baseline. His cognition appears to be at baseline based on the pt's report but skilled SLP services are clinically indicated to improve dysarthria. Pt and nursing were educated regarding results and recommendations; both parties verbalized understanding as well as agreement with plan of care.    SLP Assessment  SLP Recommendation/Assessment: Patient needs continued Speech Lanaguage Pathology Services SLP Visit Diagnosis: Dysarthria and anarthria (R47.1)    Follow Up Recommendations  Other (comment)(Continued SLP services at level of care rx by PT/OT)    Frequency and Duration min 2x/week  2 weeks      SLP Evaluation Cognition  Arousal/Alertness: Awake/alert Orientation Level: Oriented X4 Attention: Focused;Sustained Focused Attention: Appears intact Memory: Impaired(Immediate: 3/3; Delayed: 2/3) Memory Impairment: Retrieval deficit;Decreased recall of new information Awareness: Appears intact Problem Solving: Impaired Problem  Solving Impairment: Verbal complex(4/5)       Comprehension  Auditory Comprehension Overall Auditory Comprehension: Appears within functional limits for tasks assessed Yes/No Questions: Within Functional Limits(Simple: 5/5; Complex: 5/5; Paragraph: 2/4) Commands: Within  Functional Limits(2 and 3 step: 4/4) Conversation: Complex Visual Recognition/Discrimination Discrimination: Within Function Limits Reading Comprehension Reading Status: Within funtional limits    Expression Verbal Expression Overall Verbal Expression: Appears within functional limits for tasks assessed Initiation: No impairment Automatic Speech: Counting;Day of week;Month of year(WNL) Level of Generative/Spontaneous Verbalization: Sentence;Conversation Repetition: No impairment(Sentence: 5/5) Naming: No impairment Responsive: (5/5) Confrontation: (5/5) Convergent: (Sentence completion: 5/5)   Oral / Motor  Oral Motor/Sensory Function Overall Oral Motor/Sensory Function: Mild impairment Facial ROM: Reduced left;Suspected CN VII (facial) dysfunction Facial Symmetry: Within Functional Limits Facial Strength: Reduced left;Suspected CN VII (facial) dysfunction Facial Sensation: Within Functional Limits Lingual ROM: Reduced left;Suspected CN XII (hypoglossal) dysfunction Lingual Symmetry: Within Functional Limits Lingual Strength: Reduced;Suspected CN XII (hypoglossal) dysfunction Lingual Sensation: Within Functional Limits Motor Speech Overall Motor Speech: Impaired(Pt reported that his speech is approx. 80% back to baseline) Respiration: Within functional limits Phonation: Normal Resonance: Within functional limits Articulation: Impaired Level of Impairment: Sentence Intelligibility: Intelligibility reduced Word: (100) Phrase: 75-100% accurate Sentence: 75-100% accurate Conversation: 50-74% accurate Motor Planning: Witnin functional limits Motor Speech Errors: Not applicable   Alf Doyle I. Hardin Negus, [place], [place] Office number [phone] Pager [place] 11/06/2018, 9:44 AM

## 2018-11-06 NOTE — Evaluation (Signed)
Occupational Therapy Evaluation Patient Details Name: Andre Wolfe MRN: [id number] DOB: 1935-05-03 Today's Date: 11/06/2018    History of Present Illness Andre Wolfe is a 84 y.o. male with medical history significant of DM; prostate CA; afib on Eliquis; HTN; HLD; CVA in 12/19; and CAD s/p CABG presenting with code stroke. Pt presented with LUE and LLE weakness, MRI: acute R pontine infarct   Clinical Impression   This 84 yo male admitted with above presents to acute OT with decreased use of left side, inattention to left side, decreased awareness of safety with deficits (but can tell you his left side does not work well), decreased balance and mobility all affecting his independence with basic and IADLs. He will benefit from acute OT with follow up OT on CIR (was there for last CVA as well) to get to a level he can back home with his wife. We will continue to follow.     Follow Up Recommendations  CIR;Supervision/Assistance - 24 hour    Equipment Recommendations  Other (comment)(TBD next venue)       Precautions / Restrictions Precautions Precautions: Fall Restrictions Weight Bearing Restrictions: No      Mobility Bed Mobility Overal bed mobility: Needs Assistance Bed Mobility: Supine to Sit     Supine to sit: Mod assist;HOB elevated     General bed mobility comments: use of rail  Transfers Overall transfer level: Needs assistance Equipment used: Rolling walker (2 wheeled) Transfers: Sit to/from [employer] Sit to Stand: Mod assist;+2 physical assistance Stand pivot transfers: Mod assist;+2 physical assistance       General transfer comment: with use of RW for stand pivot    Balance Overall balance assessment: Needs assistance Sitting-balance support: No upper extremity supported;Feet supported Sitting balance-Leahy Scale: Fair     Standing balance support: Bilateral upper extremity supported Standing balance-Leahy Scale: Poor                              ADL either performed or assessed with clinical judgement   ADL Overall ADL's : Needs assistance/impaired Eating/Feeding: Set up;Sitting   Grooming: Moderate assistance;Sitting   Upper Body Bathing: Moderate assistance;Sitting   Lower Body Bathing: Maximal assistance Lower Body Bathing Details (indicate cue type and reason): Mod A +2 sit<>stand Upper Body Dressing : Moderate assistance;Sitting   Lower Body Dressing: Total assistance Lower Body Dressing Details (indicate cue type and reason): Mod A +2 sit<>stand Toilet Transfer: Moderate assistance;+2 for physical assistance;Stand-pivot Toilet Transfer Details (indicate cue type and reason): Bed>recliner going pt's right with RW Toileting- Clothing Manipulation and Hygiene: Maximal assistance Toileting - Clothing Manipulation Details (indicate cue type and reason): Mod A +2 sit<>stand             Vision Baseline Vision/History: Wears glasses Wears Glasses: Reading only Patient Visual Report: No change from baseline Vision Assessment?: Yes Eye Alignment: Within Functional Limits Ocular Range of Motion: Within Functional Limits Alignment/Gaze Preference: Gaze right Tracking/Visual Pursuits: Able to track stimulus in all quads without difficulty Convergence: Within functional limits Visual Fields: No apparent deficits Additional Comments: pt with left inattention            Pertinent Vitals/Pain Pain Assessment: No/denies pain     Hand Dominance Right   Extremity/Trunk Assessment Upper Extremity Assessment Upper Extremity Assessment: LUE deficits/detail LUE Deficits / Details: Brunstrum stage 4-5 LUE Sensation: WNL LUE Coordination: decreased fine motor;decreased gross motor  Communication Communication Communication: HOH   Cognition Arousal/Alertness: Awake/alert Behavior During Therapy: WFL for tasks assessed/performed Overall Cognitive Status: Impaired/Different from  baseline Area of Impairment: Safety/judgement;Problem solving                         Safety/Judgement: Decreased awareness of safety;Decreased awareness of deficits   Problem Solving: Difficulty sequencing;Requires verbal cues                Home Living Family/patient expects to be discharged to:: Private residence Living Arrangements: Spouse/significant other Available Help at Discharge: Family;Available 24 hours/day Type of Home: House Home Access: Level entry;Ramped entrance     Home Layout: One level     Bathroom Shower/Tub: Occupational psychologist: Standard Bathroom Accessibility: Yes   Home Equipment: Environmental consultant - 2 wheels;Cane - single point;Bedside commode;Shower seat;Grab bars - tub/shower;Wheelchair - manual   Additional Comments: sideways with RW if needed  Lives With: Spouse    Prior Functioning/Environment Level of Independence: Independent with assistive device(s)        Comments: about 2 months ago was using a cane in the community only; went shopping with his wife - used grocery cart; does not drive        OT Problem List: Decreased strength;Decreased range of motion;Impaired balance (sitting and/or standing);Decreased safety awareness;Decreased cognition;Impaired UE functional use;Decreased coordination;Decreased knowledge of use of DME or AE;Impaired tone      OT Treatment/Interventions: Self-care/ADL training;Balance training;Neuromuscular education;Visual/perceptual remediation/compensation;Patient/family education;DME and/or AE instruction;Therapeutic activities;Therapeutic exercise    OT Goals(Current goals can be found in the care plan section) Acute Rehab OT Goals Patient Stated Goal: to go back home OT Goal Formulation: With patient Time For Goal Achievement: 11/20/18 Potential to Achieve Goals: Good  OT Frequency: Min 3X/week           Co-evaluation PT/OT/SLP Co-Evaluation/Treatment: Yes Reason for Co-Treatment:  Necessary to address cognition/behavior during functional activity;For patient/therapist safety PT goals addressed during session: Mobility/safety with mobility;Balance;Proper use of DME;Strengthening/ROM OT goals addressed during session: Strengthening/ROM;ADL's and self-care      AM-PAC OT "6 Clicks" Daily Activity     Outcome Measure Help from another person eating meals?: A Little Help from another person taking care of personal grooming?: A Lot Help from another person toileting, which includes using toliet, bedpan, or urinal?: A Lot Help from another person bathing (including washing, rinsing, drying)?: A Lot Help from another person to put on and taking off regular upper body clothing?: A Lot Help from another person to put on and taking off regular lower body clothing?: Total 6 Click Score: 12   End of Session Equipment Utilized During Treatment: Gait belt;Rolling walker Nurse Communication: Mobility status(NT)  Activity Tolerance: Patient tolerated treatment well Patient left: in chair;with call bell/phone within reach;with chair alarm set  OT Visit Diagnosis: Unsteadiness on feet (R26.81);Other abnormalities of gait and mobility (R26.89);Muscle weakness (generalized) (M62.81);Other symptoms and signs involving cognitive function;Hemiplegia and hemiparesis Hemiplegia - Right/Left: Left Hemiplegia - dominant/non-dominant: Non-Dominant Hemiplegia - caused by: Cerebral infarction                Time: 1884-1660 OT Time Calculation (min): 24 min Charges:  OT General Charges $OT Visit: 1 Visit OT Evaluation $OT Eval Moderate Complexity: 1 Mod  [address], OTR/L Acute [employer] Pager [phone] Office [phone]    Almon Register 11/06/2018, 2:07 PM

## 2018-11-06 NOTE — TOC Initial Note (Signed)
Transition of Care [facility]) - Initial/Assessment Note    Patient Details  Name: Andre Wolfe MRN: [id number] Date of Birth: 1935/01/21  [facility] CM/SW Contact:    Pollie Friar, RN Phone Number: 11/06/2018, 2:10 PM  Clinical Narrative:                   Expected Discharge Plan: Skilled Nursing Facility Barriers to Discharge: Continued Medical Work up   Patient Goals and CMS Choice Patient states their goals for this hospitalization and ongoing recovery are:: to use lt leg again      Expected Discharge Plan and Services Expected Discharge Plan: [place]       Living arrangements for the past 2 months: Single Family Home(one level)                                      Prior Living Arrangements/Services Living arrangements for the past 2 months: Single Family Home(one level) Lives with:: Spouse Patient language and need for interpreter reviewed:: Yes(no needs) Do you feel safe going back to the place where you live?: Yes      Need for Family Participation in Patient Care: Yes (Comment) Care giver support system in place?: Yes (comment)(has 24/7 support and supervision with his wife) Current home services: DME(wheelchair, cane, walker, shower chair, 3 in 1.) Criminal Activity/Legal Involvement Pertinent to Current Situation/Hospitalization: No - Comment as needed  Activities of Daily Living      Permission Sought/Granted Permission sought to share information with : Family Supports Permission granted to share information with : Yes, Verbal Permission Granted  Share Information with NAME: Cheryln Manly     Permission granted to share info w Relationship: daughter  Permission granted to share info w Contact Information: [phone]  Emotional Assessment Appearance:: Appears stated age Attitude/Demeanor/Rapport: Grieving Affect (typically observed): Accepting, Tearful/Crying Orientation: : Oriented to Self, Oriented to Place,  Oriented to  Time, Oriented to Situation   Psych Involvement: No (comment)  Admission diagnosis:  Left-sided weakness [R53.1] Patient Active Problem List   Diagnosis Date Noted  . Bradycardia   . Right pontine stroke ([place]) 07/16/2018  . Acute ischemic stroke ([place])   . Essential hypertension   . Poorly controlled type 2 diabetes mellitus with peripheral neuropathy ([place])   . Tobacco abuse   . PAF (paroxysmal atrial fibrillation) ([place])   . Right pontine CVA ([place]) 07/13/2018  . Hypercholesterolemia 05/04/2018   PCP:  [name], MD Pharmacy:   [facility] [address], [place] - [address] [place] [place] [address] Phone: [phone] Fax: [phone]     Social Determinants of Health (SDOH) Interventions  daughter provides transportation  Readmission Risk Interventions No flowsheet data found.

## 2018-11-06 NOTE — Progress Notes (Signed)
Inpatient Rehab Admissions:  Inpatient Rehab Consult received.  I met with patient at the bedside for rehabilitation assessment and to discuss goals and expectations of an inpatient rehab admission.  He is tearful, and wanting to come back to CIR following acute admission.  He gave me permission to speak with his family.  Will follow up Monday for possible admission at that time pending bed availability.   Signed: Shann Medal, PT, DPT Admissions Coordinator [phone] 11/06/18  2:43 PM

## 2018-11-06 NOTE — Progress Notes (Signed)
11/06/18 1712  PT Visit Information  Last PT Received On 11/06/18  Assistance Needed +2  PT/OT/SLP Co-Evaluation/Treatment Yes  Reason for Co-Treatment Necessary to address cognition/behavior during functional activity;To address functional/ADL transfers  PT goals addressed during session Mobility/safety with mobility;Balance;Proper use of DME  History of Present Illness JAILEN COWARD is a 84 y.o. male with medical history significant of DM; prostate CA; afib on Eliquis; HTN; HLD; CVA in 12/19; and CAD s/p CABG presenting with code stroke. Pt presented with LUE and LLE weakness, MRI: acute R pontine infarct  Precautions  Precautions Fall  Restrictions  Weight Bearing Restrictions No  Home Living  Family/patient expects to be discharged to: Private residence  Living Arrangements Spouse/significant other  Available Help at Discharge Family;Available 24 hours/day  Type of Home House  Home Access Level entry;Ramped entrance  Home Layout One level  Bathroom Shower/Tub Walk-in Cytogeneticist Yes  Home Equipment Walker - 2 wheels;Cane - single point;BSC;Shower seat;Grab bars - tub/shower;Wheelchair - manual  Additional Comments sideways with RW if needed  Prior Function  Level of Independence Independent with assistive device(s)  Comments about 2 months ago was using a cane in the community only; went shopping with his wife - used grocery cart; does not drive  Communication  Communication HOH  Pain Assessment  Pain Assessment No/denies pain  Cognition  Arousal/Alertness Awake/alert  Behavior During Therapy WFL for tasks assessed/performed  Overall Cognitive Status Impaired/Different from baseline  Area of Impairment Safety/judgement;Problem solving  Safety/Judgement Decreased awareness of safety;Decreased awareness of deficits  Problem Solving Difficulty sequencing;Requires verbal cues  Upper Extremity Assessment  Upper Extremity Assessment  Defer to OT evaluation  Lower Extremity Assessment  Lower Extremity Assessment LLE deficits/detail  LLE Deficits / Details Grossly 2+/5 throughout.   LLE Sensation WNL  LLE Coordination decreased gross motor;decreased fine motor  Cervical / Trunk Assessment  Cervical / Trunk Assessment Normal  Bed Mobility  Overal bed mobility Needs Assistance  Bed Mobility Supine to Sit  Supine to sit Mod assist;HOB elevated  General bed mobility comments use of rail  Transfers  Overall transfer level Needs assistance  Equipment used Rolling walker (2 wheeled)  Transfers Sit to/from [employer] Transfers  Sit to Stand Mod assist;+2 physical assistance  Stand pivot transfers Mod assist;+2 physical assistance  General transfer comment Mod A +2 for lift assist and steadying during transfer to chair. L knee buckling noted throughout transfer. Cues for quad activation and knee extension.   Modified Rankin (Stroke Patients Only)  Pre-Morbid Rankin Score 3  Modified Rankin 4  Balance  Overall balance assessment Needs assistance  Sitting-balance support No upper extremity supported;Feet supported  Sitting balance-Leahy Scale Fair  Standing balance support Bilateral upper extremity supported  Standing balance-Leahy Scale Poor  Standing balance comment Reliant on BUE support   PT - End of Session  Equipment Utilized During Treatment Gait belt  Activity Tolerance Patient tolerated treatment well  Patient left in chair;with call bell/phone within reach;with chair alarm set  Nurse Communication Mobility status  PT Assessment  PT Recommendation/Assessment Patient needs continued PT services  PT Visit Diagnosis Unsteadiness on feet (R26.81);Muscle weakness (generalized) (M62.81);Difficulty in walking, not elsewhere classified (R26.2);Hemiplegia and hemiparesis  Hemiplegia - Right/Left Left  Hemiplegia - dominant/non-dominant Non-dominant  Hemiplegia - caused by Cerebral infarction  PT Problem List  Decreased strength;Decreased balance;Decreased mobility;Decreased coordination;Decreased knowledge of use of DME;Decreased knowledge of precautions  PT Plan  PT Frequency (ACUTE ONLY) Min  4X/week  PT Treatment/Interventions (ACUTE ONLY) DME instruction;Functional mobility training;Therapeutic activities;Therapeutic exercise;Stair training;Gait training;Balance training;Neuromuscular re-education;Patient/family education  AM-PAC PT "6 Clicks" Mobility Outcome Measure (Version 2)  Help needed turning from your back to your side while in a flat bed without using bedrails? 2  Help needed moving from lying on your back to sitting on the side of a flat bed without using bedrails? 2  Help needed moving to and from a bed to a chair (including a wheelchair)? 2  Help needed standing up from a chair using your arms (e.g., wheelchair or bedside chair)? 2  Help needed to walk in hospital room? 1  Help needed climbing 3-5 steps with a railing?  1  6 Click Score 10  Consider Recommendation of Discharge To: CIR/SNF/LTACH  PT Recommendation  Recommendations for Other Services Rehab consult  Follow Up Recommendations CIR  PT equipment None recommended by PT  Individuals Consulted  Consulted and Agree with Results and Recommendations Patient  Acute Rehab PT Goals  Patient Stated Goal to go back home  PT Goal Formulation With patient  Time For Goal Achievement 11/20/18  Potential to Achieve Goals Good  PT Time Calculation  PT Start Time (ACUTE ONLY) 1121  PT Stop Time (ACUTE ONLY) 1145  PT Time Calculation (min) (ACUTE ONLY) 24 min  PT General Charges  $$ ACUTE PT VISIT 1 Visit  PT Evaluation  $PT Eval Moderate Complexity 1 Mod  Written Expression  Dominant Hand Right   Pt admitted secondary to problem above with deficits below. Pt with decreased awareness of safety and deficits and presenting with left extremity weakness. Required mod A +2 to transfer to chair this session and pt with notable LLE  buckling. Pt motivated to regain independence and feel he would be excellent candidate for CIR. Will continue to follow acutely to maximize functional mobility independence and safety.   Leighton Ruff, PT, DPT  Acute Rehabilitation Services  Pager: [phone] Office: [phone]

## 2018-11-06 NOTE — Progress Notes (Addendum)
PROGRESS NOTE  Andre Wolfe NWG:[id number] DOB: 02-19-1935 DOA: 11/05/2018 PCP: [name], MD  Brief History:  84 year old male with a history of paroxysmal atrial fibrillation, hypertension, hyperlipidemia, coronary disease, stroke, prostate cancer presenting with onset of left hemiparesis of the left upper extremity and left lower extremity around 6 PM on 11/05/2018.  In addition, the patient also had a sensation of difficulty swallowing around that time as well as some slurred speech.  As result, EMS was activated.  His deficits remained stable in the emergency department.  Neurology was consulted.  CT the brain showed expected evolution of the right pontine infarct.  CT angiogram of the head and neck was negative for LVO.  The patient was admitted for further stroke work-up.  The patient endorses compliance with all his medications. Notably, the patient was recent admitted to the hospital from 07/13/2018 through 07/16/2018 during which she was treated for an acute right pontine infarct.  It was felt to be due to small vessel disease.  He was noted to have paroxysmal atrial fibrillation with RVR at that time.  He was started on apixaban.  He was subsequently discharged to CIR where he stayed until 08/02/2018.  At baseline, the patient uses a cane and occasionally a walker when he is out of the house.  Assessment/Plan: Acute ischemic stroke -Appreciate Neurology Consult -PT/OT evaluation -Speech therapy eval -CT brain--expected evolution of the right pontine infarct -MRI brain--recurrent acute right pontine infarct -CTA H&N--no LVO; widespread intracranial ASVD stable since MRA 06/2018 -Echo--07/14/18-- -LDL--40 -HbA1C--8.0 -Antiplatelet--ASA 81+apixaban  Diabetes mellitus type 2, uncontrolled with hyperglycemia -Continue 70/30 insulin -NovoLog sliding scale -11/05/2018 hemoglobin A1c 8.0  Hyperlipidemia -Patient receives Repatha q. 14 days -LDL 40  Essential  hypertension -Holding amlodipine and metoprolol tartrate to allow for permissive hypertension -Hydralazine prn SBP >220   Paroxysmal atrial fibrillation -Presently in sinus rhythm -Continue apixaban -CHADSVASc = 7    Disposition Plan:   CIR when cleared by neurology Family Communication:   Daughter updated on phone 4/24  Consultants:  neurology  Code Status: DNR  DVT Prophylaxis:  apixaban   Procedures: As Listed in Progress Note Above  Antibiotics: None   Total time spent 35 minutes.  Greater than 50% spent face to face counseling and coordinating care.     Subjective: Patient denies fevers, chills, headache, chest pain, dyspnea, nausea, vomiting, diarrhea, abdominal pain, dysuria, hematuria, hematochezia, and melena.   Objective: Vitals:   11/05/18 2141 11/06/18 0100 11/06/18 0450 11/06/18 0814  BP: (!) 181/68 (!) 156/62 (!) 163/75 (!) 185/52  Pulse: (!) 57 (!) 51 (!) 55 63  Resp:  20 20 16   Temp:  98.4 F (36.9 C) 98.1 F (36.7 C) 98.5 F (36.9 C)  TempSrc:  Oral Oral Oral  SpO2:  95% 98%   Weight:        Intake/Output Summary (Last 24 hours) at 11/06/2018 0840 Last data filed at 11/06/2018 0451 Gross per 24 hour  Intake --  Output 1200 ml  Net -1200 ml   Weight change:  Exam:   General:  Pt is alert, follows commands appropriately, not in acute distress  HEENT: No icterus, No thrush, No neck mass, [place]/AT  Cardiovascular: RRR, S1/S2, no rubs, no gallops  Respiratory: CTA bilaterally, no wheezing, no crackles, no rhonchi  Abdomen: Soft/+BS, non tender, non distended, no guarding  Extremities: No edema, No lymphangitis, No petechiae, No rashes, no synovitis  Neuro:  CN  II-XII intact, strength 5/5 in RUE, RLE, strength 4-/5 LUE, LLE; sensation intact bilateral; no dysmetria; babinski equivocal     Data Reviewed: I have personally reviewed following labs and imaging studies Basic Metabolic Panel: Recent Labs  Lab 11/05/18 0819  11/05/18 0831  NA 137  --   K 4.8  --   CL 103  --   CO2 24  --   GLUCOSE 188*  --   BUN 15  --   CREATININE 1.08 1.00  CALCIUM 9.3  --    Liver Function Tests: Recent Labs  Lab 11/05/18 0819  AST 25  ALT 25  ALKPHOS 52  BILITOT 1.1  PROT 6.9  ALBUMIN 3.7   No results for input(s): LIPASE, AMYLASE in the last 168 hours. No results for input(s): AMMONIA in the last 168 hours. Coagulation Profile: Recent Labs  Lab 11/05/18 0819  INR 1.3*   CBC: Recent Labs  Lab 11/05/18 0819  WBC 7.4  NEUTROABS 4.9  HGB 15.9  HCT 46.4  MCV 86.2  PLT 211   Cardiac Enzymes: No results for input(s): CKTOTAL, CKMB, CKMBINDEX, TROPONINI in the last 168 hours. BNP: Invalid input(s): POCBNP CBG: Recent Labs  Lab 11/05/18 0824 11/05/18 1604 11/05/18 2127 11/06/18 0607  GLUCAP 194* 175* 133* 132*   HbA1C: Recent Labs    11/05/18 0819  HGBA1C 8.0*   Urine analysis:    Component Value Date/Time   COLORURINE YELLOW 07/13/2018 [place] 07/13/2018 1321   LABSPEC 1.028 07/13/2018 1321   PHURINE 5.0 07/13/2018 1321   GLUCOSEU >=500 (A) 07/13/2018 1321   HGBUR NEGATIVE 07/13/2018 1321   BILIRUBINUR NEGATIVE 07/13/2018 1321   KETONESUR NEGATIVE 07/13/2018 1321   PROTEINUR 30 (A) 07/13/2018 1321   NITRITE NEGATIVE 07/13/2018 1321   LEUKOCYTESUR NEGATIVE 07/13/2018 1321   Sepsis Labs: @LABRCNTIP (procalcitonin:4,lacticidven:4) )No results found for this or any previous visit (from the past 240 hour(s)).   Scheduled Meds:  amLODipine  2.5 mg Oral Daily   apixaban  5 mg Oral BID   aspirin EC  81 mg Oral Daily   feeding supplement (ENSURE ENLIVE)  237 mL Oral BID BM   gabapentin  600 mg Oral QHS   insulin aspart  0-15 Units Subcutaneous TID WC   insulin aspart  0-5 Units Subcutaneous QHS   insulin aspart protamine- aspart  10 Units Subcutaneous QAC lunch   insulin aspart protamine- aspart  20 Units Subcutaneous Q supper   insulin aspart  protamine- aspart  30 Units Subcutaneous Q breakfast   metoprolol tartrate  12.5 mg Oral BID   pantoprazole  40 mg Oral Daily   Continuous Infusions:  sodium chloride 50 mL/hr at 11/05/18 1341    Procedures/Studies: Ct Angio Head W Or Wo Contrast  Result Date: 11/05/2018 CLINICAL DATA:  Code stroke.  84 year old male. EXAM: CT ANGIOGRAPHY HEAD AND NECK TECHNIQUE: Multidetector CT imaging of the head and neck was performed using the standard protocol during bolus administration of intravenous contrast. Multiplanar CT image reconstructions and MIPs were obtained to evaluate the vascular anatomy. Carotid stenosis measurements (when applicable) are obtained utilizing NASCET criteria, using the distal internal carotid diameter as the denominator. CONTRAST:  76mL OMNIPAQUE IOHEXOL 350 MG/ML SOLN COMPARISON:  Head CT without contrast 0824 hours today. MRI and intracranial MRA 07/13/2018. FINDINGS: CTA NECK Skeleton: Prior sternotomy. No acute osseous abnormality identified. Upper chest: Centrilobular emphysema. No superior mediastinal lymphadenopathy. Other neck: Mild thyroid goiter on the right. Small 12 millimeter left parotid  gland nodule is stable since December (series 9, image 163) and head hypercellular features on MRI. Other neck soft tissues are within normal limits. Aortic arch: Calcified aortic atherosclerosis. 3 vessel arch configuration. Right carotid system: No brachiocephalic or right CCA origin stenosis despite plaque. Soft and calcified plaque at the right ICA origin and bulb with less than 50 % stenosis with respect to the distal vessel. Left carotid system: No left CCA origin stenosis. Mildly tortuous left CCA. Soft and calcified plaque at the left ICA origin and bulb with less than 50 % stenosis with respect to the distal vessel. Vertebral arteries: Tortuous proximal right subclavian artery with soft and calcified plaque but no stenosis. Normal right vertebral artery origin. Patent right  vertebral artery to the skull base without stenosis. Mildly tortuous proximal left subclavian artery with soft and calcified plaque but no stenosis. Patent left vertebral artery origin without stenosis. Mild left V1 calcified plaque and tortuosity. Left V2 segment irregularity intermittently throughout the neck suggesting multifocal soft plaque, but no hemodynamically significant stenosis identified to the skull base. CTA HEAD Posterior circulation: Codominant distal vertebral arteries without stenosis. Patent vertebrobasilar junction and PICA origins. Patent basilar artery without stenosis. Normal SCA and right PCA origins. Moderate to severe irregularity and stenosis redemonstrated at the left PCA origin and stable from the prior MRA. The left P2 segment is mildly irregular but the other left PCA branches are within normal limits. There is contralateral moderate to severe right P2 segment stenosis which appears progressed since December, but with preserved distal right PCA enhancement. Anterior circulation: Both ICA siphons are patent. Extensive left siphon plaque including bulky calcified plaque in the cavernous and proximal supraclinoid segments and soft plaque in the petrous segment. Mild to moderate subsequent left ICA stenosis appears stable since December. Normal left ophthalmic artery origin. Posterior communicating arteries are diminutive or absent. Right ICA siphon similar soft and calcified plaque with mild to moderate stenosis stable since December. Normal left ophthalmic artery origin. Patent carotid termini. Dominant left and diminutive or absent right ACA A1 segments. Normal left A1, anterior communicating artery and bilateral ACA branches. Left MCA M1 segment and trifurcation are patent with mild irregularity that appears stable since December. Left MCA branches are stable. Right MCA M1 and bifurcation are patent and appear stable since December. Right MCA branches appear stable. Venous sinuses: Not  well evaluated due to early contrast timing Anatomic variants: Dominant left ACA A1. Bovine arch configuration. Review of the MIP images confirms the above findings IMPRESSION: 1. Negative for emergent large vessel occlusion. 2. Widespread intracranial atherosclerosis which appears largely stable since the December MRA. - possible increased moderate to severe Right PCA P2 stenosis. - chronic severe Left PCA origin stenosis. - up to moderate bilateral ICA siphon stenosis. 3. Atherosclerosis in the neck and Aortic Atherosclerosis (ICD10-I70.0). No significant cervical carotid or vertebral artery stenosis identified. 4. 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 Recommend outpatient ENT follow-up. Salient findings communicated to Dr. Rory Percy at [phone] hours on 11/05/2018 by text page via the [facility] messaging system. Electronically Signed   By: Genevie Ann M.D.   On: 11/05/2018 08:50   Ct Angio Neck W Or Wo Contrast  Result Date: 11/05/2018 CLINICAL DATA:  Code stroke.  84 year old male. EXAM: CT ANGIOGRAPHY HEAD AND NECK TECHNIQUE: Multidetector CT imaging of the head and neck was performed using the standard protocol during bolus administration of intravenous contrast. Multiplanar CT image reconstructions  and MIPs were obtained to evaluate the vascular anatomy. Carotid stenosis measurements (when applicable) are obtained utilizing NASCET criteria, using the distal internal carotid diameter as the denominator. CONTRAST:  6mL OMNIPAQUE IOHEXOL 350 MG/ML SOLN COMPARISON:  Head CT without contrast 0824 hours today. MRI and intracranial MRA 07/13/2018. FINDINGS: CTA NECK Skeleton: Prior sternotomy. No acute osseous abnormality identified. Upper chest: Centrilobular emphysema. No superior mediastinal lymphadenopathy. Other neck: Mild thyroid goiter on the right. Small 12 millimeter left parotid gland nodule is stable since December (series 9, image 163) and head hypercellular  features on MRI. Other neck soft tissues are within normal limits. Aortic arch: Calcified aortic atherosclerosis. 3 vessel arch configuration. Right carotid system: No brachiocephalic or right CCA origin stenosis despite plaque. Soft and calcified plaque at the right ICA origin and bulb with less than 50 % stenosis with respect to the distal vessel. Left carotid system: No left CCA origin stenosis. Mildly tortuous left CCA. Soft and calcified plaque at the left ICA origin and bulb with less than 50 % stenosis with respect to the distal vessel. Vertebral arteries: Tortuous proximal right subclavian artery with soft and calcified plaque but no stenosis. Normal right vertebral artery origin. Patent right vertebral artery to the skull base without stenosis. Mildly tortuous proximal left subclavian artery with soft and calcified plaque but no stenosis. Patent left vertebral artery origin without stenosis. Mild left V1 calcified plaque and tortuosity. Left V2 segment irregularity intermittently throughout the neck suggesting multifocal soft plaque, but no hemodynamically significant stenosis identified to the skull base. CTA HEAD Posterior circulation: Codominant distal vertebral arteries without stenosis. Patent vertebrobasilar junction and PICA origins. Patent basilar artery without stenosis. Normal SCA and right PCA origins. Moderate to severe irregularity and stenosis redemonstrated at the left PCA origin and stable from the prior MRA. The left P2 segment is mildly irregular but the other left PCA branches are within normal limits. There is contralateral moderate to severe right P2 segment stenosis which appears progressed since December, but with preserved distal right PCA enhancement. Anterior circulation: Both ICA siphons are patent. Extensive left siphon plaque including bulky calcified plaque in the cavernous and proximal supraclinoid segments and soft plaque in the petrous segment. Mild to moderate subsequent  left ICA stenosis appears stable since December. Normal left ophthalmic artery origin. Posterior communicating arteries are diminutive or absent. Right ICA siphon similar soft and calcified plaque with mild to moderate stenosis stable since December. Normal left ophthalmic artery origin. Patent carotid termini. Dominant left and diminutive or absent right ACA A1 segments. Normal left A1, anterior communicating artery and bilateral ACA branches. Left MCA M1 segment and trifurcation are patent with mild irregularity that appears stable since December. Left MCA branches are stable. Right MCA M1 and bifurcation are patent and appear stable since December. Right MCA branches appear stable. Venous sinuses: Not well evaluated due to early contrast timing Anatomic variants: Dominant left ACA A1. Bovine arch configuration. Review of the MIP images confirms the above findings IMPRESSION: 1. Negative for emergent large vessel occlusion. 2. Widespread intracranial atherosclerosis which appears largely stable since the December MRA. - possible increased moderate to severe Right PCA P2 stenosis. - chronic severe Left PCA origin stenosis. - up to moderate bilateral ICA siphon stenosis. 3. Atherosclerosis in the neck and Aortic Atherosclerosis (ICD10-I70.0). No significant cervical carotid or vertebral artery stenosis identified. 4. 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 Recommend outpatient ENT follow-up. Salient findings communicated  to Dr. Rory Percy at [phone] hours on 11/05/2018 by text page via the [facility] messaging system. Electronically Signed   By: Genevie Ann M.D.   On: 11/05/2018 08:50   Mr Brain Wo Contrast (neuro Protocol)  Result Date: 11/05/2018 CLINICAL DATA:  Left-sided weakness and dysarthria.  Prior stroke. EXAM: MRI HEAD WITHOUT CONTRAST TECHNIQUE: Multiplanar, multiecho pulse sequences of the brain and surrounding structures were obtained without intravenous contrast.  COMPARISON:  Head CT and CTA 11/05/2018 and MRI 07/13/2018 FINDINGS: Brain: There is a recurrent acute right pontine infarct, smaller than that on the prior MRI. The acute ischemia is located along the margins of the old infarct and predominantly posterior to it. Diffusion abnormality associated with the punctate left parietal infarct on the prior MRI has resolved. Patchy T2 hyperintensities in the cerebral white matter are unchanged and nonspecific but compatible with chronic small vessel ischemic disease, mildly advanced for age. Mild cerebral atrophy for age is also unchanged. No intracranial hemorrhage, mass, midline shift, or extra-axial fluid collection is identified. Vascular: Major intracranial vascular flow voids are preserved. Skull and upper cervical spine: Unremarkable bone marrow signal. Sinuses/Orbits: Bilateral cataract extraction. Mild left maxillary sinus mucosal thickening. Clear mastoid air cells. Other: None. IMPRESSION: 1. Recurrent acute right pontine infarct. 2. Mild chronic small vessel ischemic disease in the cerebral white matter. Electronically Signed   By: Logan Bores M.D.   On: 11/05/2018 11:50   Ct Head Code Stroke Wo Contrast  Result Date: 11/05/2018 CLINICAL DATA:  Code stroke. 84 year old male. History of small vessel infarcts in 2019. EXAM: CT HEAD WITHOUT CONTRAST TECHNIQUE: Contiguous axial images were obtained from the base of the skull through the vertex without intravenous contrast. COMPARISON:  Brain MRI and head CT 07/13/2018 FINDINGS: Brain: Stable cerebral volume. No midline shift, mass effect, or evidence of intracranial mass lesion. No acute intracranial hemorrhage identified. No ventriculomegaly. Evolved right pontine infarct seen in December. Patchy bilateral cerebral white matter hypodensity. Asymmetric left occipital lobe sulcus or less likely chronic encephalomalacia is stable. Stable gray-white matter differentiation throughout the brain. No cortically based  acute infarct identified. Vascular: Calcified atherosclerosis at the skull base. No suspicious intracranial vascular hyperdensity. Skull: No acute osseous abnormality identified. Sinuses/Orbits: Visualized paranasal sinuses and mastoids are stable and well pneumatized. Other: No acute orbit or scalp soft tissue finding. ASPECTS [facility] Stroke Program Early CT Score) - Ganglionic level infarction (caudate, lentiform nuclei, internal capsule, insula, M1-M3 cortex): 7 - Supraganglionic infarction (M4-M6 cortex): 3 Total score (0-10 with 10 being normal): 10 IMPRESSION: 1. No acute cortically based infarct or acute intracranial hemorrhage identified. ASPECTS is 10. 2. Expected evolution of right pontine infarct since December. Chronic white matter disease. 3. These results were communicated to Dr. Rory Percy at 8:32 am on 4/23/2020by text page via the [facility] messaging system. Electronically Signed   By: Genevie Ann M.D.   On: 11/05/2018 08:33    Orson Eva, DO  [facility] Pager [phone]  If 7PM-7AM, please contact night-coverage [email or web address] Password [email or web address] 11/06/2018, 8:40 AM   LOS: 1 day

## 2018-11-07 ENCOUNTER — Other Ambulatory Visit: Payer: Self-pay

## 2018-11-07 LAB — GLUCOSE, CAPILLARY
Glucose-Capillary: 152 mg/dL — ABNORMAL HIGH (ref 70–99)
Glucose-Capillary: 275 mg/dL — ABNORMAL HIGH (ref 70–99)
Glucose-Capillary: 74 mg/dL (ref 70–99)

## 2018-11-07 MED ORDER — AMLODIPINE BESYLATE 2.5 MG PO TABS
2.5000 mg | ORAL_TABLET | Freq: Every day | ORAL | Status: DC
  Administered 2018-11-07 – 2018-11-09 (×3): 2.5 mg via ORAL
  Filled 2018-11-07 (×3): qty 1

## 2018-11-07 NOTE — Progress Notes (Signed)
PROGRESS NOTE  Andre Wolfe OIN:[id number] DOB: Apr 08, 1935 DOA: 11/05/2018 PCP: [name], MD  Brief History:  84 year old male with a history of paroxysmal atrial fibrillation, hypertension, hyperlipidemia, coronary disease, stroke, prostate cancer presenting with onset of left hemiparesis of the left upper extremity and left lower extremity around 6 PM on 11/05/2018.  In addition, the patient also had a sensation of difficulty swallowing around that time as well as some slurred speech.  As result, EMS was activated.  His deficits remained stable in the emergency department.  Neurology was consulted.  CT the brain showed expected evolution of the right pontine infarct.  CT angiogram of the head and neck was negative for LVO.  The patient was admitted for further stroke work-up.  The patient endorses compliance with all his medications. Notably, the patient was recent admitted to the hospital from 07/13/2018 through 07/16/2018 during which she was treated for an acute right pontine infarct.  It was felt to be due to small vessel disease.  He was noted to have paroxysmal atrial fibrillation with RVR at that time.  He was started on apixaban.  He was subsequently discharged to CIR where he stayed until 08/02/2018.  At baseline, the patient uses a cane and occasionally a walker when he is out of the house.    Assessment/Plan:  Acute ischemic stroke, right pontine POA -Neurology signing off, patient stable for discharge to inpatient rehab per their recommendations -PT/OT evaluation ongoing, pending inpatient rehab evaluation and placement -Patient continues to have left upper extremity weakness although denies any paresthesias or decreased sensation -Speech therapy following, appreciate insight and recommendations -CT brain--expected evolution of the right pontine infarct -MRI brain--recurrent acute right pontine infarct -CTA H&N--no LVO; widespread intracranial ASVD stable since MRA  06/2018 -Echo--07/14/18--no need for repeat imaging -LDL--40 -HbA1C--8.0 -Antiplatelet--ASA 81+apixaban per neurology recommendations  Insulin dependent diabetes mellitus type 2, poorly controlled -Continue 70/30 insulin -NovoLog sliding scale -11/05/2018 hemoglobin A1c 8.0  Hyperlipidemia -Patient receives Repatha q. 14 days -LDL 40 -Patient carries an allergy to statin in the form of myalgias  Essential hypertension -Previously allowing for permissive hypertension -Resume home medications as indicated with amlodipine and metoprolol tartrate -Hydralazine prn SBP >220  Paroxysmal atrial fibrillation, without RVR -Transitioned into A. fib today although rate controlled, restart metoprolol as above -Continue apixaban -CHADSVASc = 7  Disposition Plan:   CIR once bed has been made available Family Communication:   Daughter updated on phone 4/24 Consultants:  neurology Code Status: DNR DVT Prophylaxis:  apixaban  Antibiotics: None  Total time spent 35 minutes.  Greater than 50% spent face to face counseling and coordinating care.  Subjective: Patient denies fevers, chills, headache, chest pain, dyspnea, nausea, vomiting, diarrhea, abdominal pain, dysuria, hematuria, hematochezia, and melena.   Objective: Vitals:   11/06/18 2346 11/07/18 0356 11/07/18 0839 11/07/18 1124  BP: (!) 167/65 136/80 (!) 160/89 110/73  Pulse: (!) 57 90 81 93  Resp: 17 18 15 17   Temp: 97.8 F (36.6 C) 98.1 F (36.7 C) (!) 97.5 F (36.4 C) 98 F (36.7 C)  TempSrc: Oral Oral Oral Oral  SpO2: 97% 92% 99% 96%  Weight:        Intake/Output Summary (Last 24 hours) at 11/07/2018 1456 Last data filed at 11/07/2018 1353 Gross per 24 hour  Intake 300 ml  Output 1050 ml  Net -750 ml   Weight change:  Exam:  General:  Pleasantly resting in bed,  No acute distress. HEENT:  Normocephalic atraumatic.  Sclerae nonicteric, noninjected.  Extraocular movements intact bilaterally. Neck:  Without mass or  deformity.  Trachea is midline. Lungs:  Clear to auscultate bilaterally without rhonchi, wheeze, or rales. Heart:  Regular rate and rhythm.  Without murmurs, rubs, or gallops. Abdomen:  Soft, nontender, nondistended.  Without guarding or rebound. Extremities: Without cyanosis, clubbing, edema, or obvious deformity.  Left upper extremity strength 1 out of 5, left lower extremity strength 4 out of 5; right upper and lower extremity 5 out of 5; patient intact globally Vascular:  Dorsalis pedis and posterior tibial pulses palpable bilaterally. Skin:  Warm and dry, no erythema, no ulcerations.  Data Reviewed: I have personally reviewed following labs and imaging studies Basic Metabolic Panel: Recent Labs  Lab 11/05/18 0819 11/05/18 0831 11/06/18 1039  NA 137  --  136  K 4.8  --  3.9  CL 103  --  101  CO2 24  --  24  GLUCOSE 188*  --  278*  BUN 15  --  16  CREATININE 1.08 1.00 1.11  CALCIUM 9.3  --  9.9   Liver Function Tests: Recent Labs  Lab 11/05/18 0819  AST 25  ALT 25  ALKPHOS 52  BILITOT 1.1  PROT 6.9  ALBUMIN 3.7   No results for input(s): LIPASE, AMYLASE in the last 168 hours. No results for input(s): AMMONIA in the last 168 hours. Coagulation Profile: Recent Labs  Lab 11/05/18 0819  INR 1.3*   CBC: Recent Labs  Lab 11/05/18 0819  WBC 7.4  NEUTROABS 4.9  HGB 15.9  HCT 46.4  MCV 86.2  PLT 211   Cardiac Enzymes: No results for input(s): CKTOTAL, CKMB, CKMBINDEX, TROPONINI in the last 168 hours. BNP: Invalid input(s): POCBNP CBG: Recent Labs  Lab 11/06/18 1147 11/06/18 1646 11/06/18 2112 11/07/18 0633 11/07/18 1156  GLUCAP 248* 107* 151* 152* 275*   HbA1C: Recent Labs    11/05/18 0819  HGBA1C 8.0*   Urine analysis:    Component Value Date/Time   COLORURINE YELLOW 07/13/2018 [place] 07/13/2018 1321   LABSPEC 1.028 07/13/2018 1321   PHURINE 5.0 07/13/2018 1321   GLUCOSEU >=500 (A) 07/13/2018 1321   HGBUR NEGATIVE  07/13/2018 1321   [place] 07/13/2018 1321   KETONESUR NEGATIVE 07/13/2018 1321   PROTEINUR 30 (A) 07/13/2018 1321   NITRITE NEGATIVE 07/13/2018 1321   LEUKOCYTESUR NEGATIVE 07/13/2018 1321   Sepsis Labs: @LABRCNTIP (procalcitonin:4,lacticidven:4) )No results found for this or any previous visit (from the past 240 hour(s)).   Scheduled Meds:  apixaban  5 mg Oral BID   aspirin EC  81 mg Oral Daily   feeding supplement (ENSURE ENLIVE)  237 mL Oral BID BM   FLUoxetine  20 mg Oral Daily   gabapentin  600 mg Oral QHS   insulin aspart  0-15 Units Subcutaneous TID WC   insulin aspart  0-5 Units Subcutaneous QHS   insulin aspart protamine- aspart  10 Units Subcutaneous QAC lunch   insulin aspart protamine- aspart  20 Units Subcutaneous Q supper   insulin aspart protamine- aspart  30 Units Subcutaneous Q breakfast   metoprolol tartrate  12.5 mg Oral BID   pantoprazole  40 mg Oral Daily   Continuous Infusions:   Procedures/Studies: Ct Angio Head W Or Wo Contrast  Result Date: 11/05/2018 CLINICAL DATA:  Code stroke.  84 year old male. EXAM: CT ANGIOGRAPHY HEAD AND NECK TECHNIQUE: Multidetector CT imaging of the head and neck  was performed using the standard protocol during bolus administration of intravenous contrast. Multiplanar CT image reconstructions and MIPs were obtained to evaluate the vascular anatomy. Carotid stenosis measurements (when applicable) are obtained utilizing NASCET criteria, using the distal internal carotid diameter as the denominator. CONTRAST:  64mL OMNIPAQUE IOHEXOL 350 MG/ML SOLN COMPARISON:  Head CT without contrast 0824 hours today. MRI and intracranial MRA 07/13/2018. FINDINGS: CTA NECK Skeleton: Prior sternotomy. No acute osseous abnormality identified. Upper chest: Centrilobular emphysema. No superior mediastinal lymphadenopathy. Other neck: Mild thyroid goiter on the right. Small 12 millimeter left parotid gland nodule is stable since  December (series 9, image 163) and head hypercellular features on MRI. Other neck soft tissues are within normal limits. Aortic arch: Calcified aortic atherosclerosis. 3 vessel arch configuration. Right carotid system: No brachiocephalic or right CCA origin stenosis despite plaque. Soft and calcified plaque at the right ICA origin and bulb with less than 50 % stenosis with respect to the distal vessel. Left carotid system: No left CCA origin stenosis. Mildly tortuous left CCA. Soft and calcified plaque at the left ICA origin and bulb with less than 50 % stenosis with respect to the distal vessel. Vertebral arteries: Tortuous proximal right subclavian artery with soft and calcified plaque but no stenosis. Normal right vertebral artery origin. Patent right vertebral artery to the skull base without stenosis. Mildly tortuous proximal left subclavian artery with soft and calcified plaque but no stenosis. Patent left vertebral artery origin without stenosis. Mild left V1 calcified plaque and tortuosity. Left V2 segment irregularity intermittently throughout the neck suggesting multifocal soft plaque, but no hemodynamically significant stenosis identified to the skull base. CTA HEAD Posterior circulation: Codominant distal vertebral arteries without stenosis. Patent vertebrobasilar junction and PICA origins. Patent basilar artery without stenosis. Normal SCA and right PCA origins. Moderate to severe irregularity and stenosis redemonstrated at the left PCA origin and stable from the prior MRA. The left P2 segment is mildly irregular but the other left PCA branches are within normal limits. There is contralateral moderate to severe right P2 segment stenosis which appears progressed since December, but with preserved distal right PCA enhancement. Anterior circulation: Both ICA siphons are patent. Extensive left siphon plaque including bulky calcified plaque in the cavernous and proximal supraclinoid segments and soft plaque  in the petrous segment. Mild to moderate subsequent left ICA stenosis appears stable since December. Normal left ophthalmic artery origin. Posterior communicating arteries are diminutive or absent. Right ICA siphon similar soft and calcified plaque with mild to moderate stenosis stable since December. Normal left ophthalmic artery origin. Patent carotid termini. Dominant left and diminutive or absent right ACA A1 segments. Normal left A1, anterior communicating artery and bilateral ACA branches. Left MCA M1 segment and trifurcation are patent with mild irregularity that appears stable since December. Left MCA branches are stable. Right MCA M1 and bifurcation are patent and appear stable since December. Right MCA branches appear stable. Venous sinuses: Not well evaluated due to early contrast timing Anatomic variants: Dominant left ACA A1. Bovine arch configuration. Review of the MIP images confirms the above findings IMPRESSION: 1. Negative for emergent large vessel occlusion. 2. Widespread intracranial atherosclerosis which appears largely stable since the December MRA. - possible increased moderate to severe Right PCA P2 stenosis. - chronic severe Left PCA origin stenosis. - up to moderate bilateral ICA siphon stenosis. 3. Atherosclerosis in the neck and Aortic Atherosclerosis (ICD10-I70.0). No significant cervical carotid or vertebral artery stenosis identified. 4. 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 Recommend outpatient ENT follow-up. Salient findings communicated to Dr. Rory Percy at [phone] hours on 11/05/2018 by text page via the [facility] messaging system. Electronically Signed   By: Genevie Ann M.D.   On: 11/05/2018 08:50   Ct Angio Neck W Or Wo Contrast  Result Date: 11/05/2018 CLINICAL DATA:  Code stroke.  84 year old male. EXAM: CT ANGIOGRAPHY HEAD AND NECK TECHNIQUE: Multidetector CT imaging of the head and neck was performed using the standard protocol during  bolus administration of intravenous contrast. Multiplanar CT image reconstructions and MIPs were obtained to evaluate the vascular anatomy. Carotid stenosis measurements (when applicable) are obtained utilizing NASCET criteria, using the distal internal carotid diameter as the denominator. CONTRAST:  32mL OMNIPAQUE IOHEXOL 350 MG/ML SOLN COMPARISON:  Head CT without contrast 0824 hours today. MRI and intracranial MRA 07/13/2018. FINDINGS: CTA NECK Skeleton: Prior sternotomy. No acute osseous abnormality identified. Upper chest: Centrilobular emphysema. No superior mediastinal lymphadenopathy. Other neck: Mild thyroid goiter on the right. Small 12 millimeter left parotid gland nodule is stable since December (series 9, image 163) and head hypercellular features on MRI. Other neck soft tissues are within normal limits. Aortic arch: Calcified aortic atherosclerosis. 3 vessel arch configuration. Right carotid system: No brachiocephalic or right CCA origin stenosis despite plaque. Soft and calcified plaque at the right ICA origin and bulb with less than 50 % stenosis with respect to the distal vessel. Left carotid system: No left CCA origin stenosis. Mildly tortuous left CCA. Soft and calcified plaque at the left ICA origin and bulb with less than 50 % stenosis with respect to the distal vessel. Vertebral arteries: Tortuous proximal right subclavian artery with soft and calcified plaque but no stenosis. Normal right vertebral artery origin. Patent right vertebral artery to the skull base without stenosis. Mildly tortuous proximal left subclavian artery with soft and calcified plaque but no stenosis. Patent left vertebral artery origin without stenosis. Mild left V1 calcified plaque and tortuosity. Left V2 segment irregularity intermittently throughout the neck suggesting multifocal soft plaque, but no hemodynamically significant stenosis identified to the skull base. CTA HEAD Posterior circulation: Codominant distal  vertebral arteries without stenosis. Patent vertebrobasilar junction and PICA origins. Patent basilar artery without stenosis. Normal SCA and right PCA origins. Moderate to severe irregularity and stenosis redemonstrated at the left PCA origin and stable from the prior MRA. The left P2 segment is mildly irregular but the other left PCA branches are within normal limits. There is contralateral moderate to severe right P2 segment stenosis which appears progressed since December, but with preserved distal right PCA enhancement. Anterior circulation: Both ICA siphons are patent. Extensive left siphon plaque including bulky calcified plaque in the cavernous and proximal supraclinoid segments and soft plaque in the petrous segment. Mild to moderate subsequent left ICA stenosis appears stable since December. Normal left ophthalmic artery origin. Posterior communicating arteries are diminutive or absent. Right ICA siphon similar soft and calcified plaque with mild to moderate stenosis stable since December. Normal left ophthalmic artery origin. Patent carotid termini. Dominant left and diminutive or absent right ACA A1 segments. Normal left A1, anterior communicating artery and bilateral ACA branches. Left MCA M1 segment and trifurcation are patent with mild irregularity that appears stable since December. Left MCA branches are stable. Right MCA M1 and bifurcation are patent and appear stable since December. Right MCA branches appear stable. Venous sinuses: Not well evaluated due to early contrast timing Anatomic variants: Dominant left ACA A1. Bovine  arch configuration. Review of the MIP images confirms the above findings IMPRESSION: 1. Negative for emergent large vessel occlusion. 2. Widespread intracranial atherosclerosis which appears largely stable since the December MRA. - possible increased moderate to severe Right PCA P2 stenosis. - chronic severe Left PCA origin stenosis. - up to moderate bilateral ICA siphon  stenosis. 3. Atherosclerosis in the neck and Aortic Atherosclerosis (ICD10-I70.0). No significant cervical carotid or vertebral artery stenosis identified. 4. 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 Recommend outpatient ENT follow-up. Salient findings communicated to Dr. Rory Percy at [phone] hours on 11/05/2018 by text page via the [facility] messaging system. Electronically Signed   By: Genevie Ann M.D.   On: 11/05/2018 08:50   Mr Brain Wo Contrast (neuro Protocol)  Result Date: 11/05/2018 CLINICAL DATA:  Left-sided weakness and dysarthria.  Prior stroke. EXAM: MRI HEAD WITHOUT CONTRAST TECHNIQUE: Multiplanar, multiecho pulse sequences of the brain and surrounding structures were obtained without intravenous contrast. COMPARISON:  Head CT and CTA 11/05/2018 and MRI 07/13/2018 FINDINGS: Brain: There is a recurrent acute right pontine infarct, smaller than that on the prior MRI. The acute ischemia is located along the margins of the old infarct and predominantly posterior to it. Diffusion abnormality associated with the punctate left parietal infarct on the prior MRI has resolved. Patchy T2 hyperintensities in the cerebral white matter are unchanged and nonspecific but compatible with chronic small vessel ischemic disease, mildly advanced for age. Mild cerebral atrophy for age is also unchanged. No intracranial hemorrhage, mass, midline shift, or extra-axial fluid collection is identified. Vascular: Major intracranial vascular flow voids are preserved. Skull and upper cervical spine: Unremarkable bone marrow signal. Sinuses/Orbits: Bilateral cataract extraction. Mild left maxillary sinus mucosal thickening. Clear mastoid air cells. Other: None. IMPRESSION: 1. Recurrent acute right pontine infarct. 2. Mild chronic small vessel ischemic disease in the cerebral white matter. Electronically Signed   By: Logan Bores M.D.   On: 11/05/2018 11:50   Ct Head Code Stroke Wo  Contrast  Result Date: 11/05/2018 CLINICAL DATA:  Code stroke. 84 year old male. History of small vessel infarcts in 2019. EXAM: CT HEAD WITHOUT CONTRAST TECHNIQUE: Contiguous axial images were obtained from the base of the skull through the vertex without intravenous contrast. COMPARISON:  Brain MRI and head CT 07/13/2018 FINDINGS: Brain: Stable cerebral volume. No midline shift, mass effect, or evidence of intracranial mass lesion. No acute intracranial hemorrhage identified. No ventriculomegaly. Evolved right pontine infarct seen in December. Patchy bilateral cerebral white matter hypodensity. Asymmetric left occipital lobe sulcus or less likely chronic encephalomalacia is stable. Stable gray-white matter differentiation throughout the brain. No cortically based acute infarct identified. Vascular: Calcified atherosclerosis at the skull base. No suspicious intracranial vascular hyperdensity. Skull: No acute osseous abnormality identified. Sinuses/Orbits: Visualized paranasal sinuses and mastoids are stable and well pneumatized. Other: No acute orbit or scalp soft tissue finding. ASPECTS [facility] Stroke Program Early CT Score) - Ganglionic level infarction (caudate, lentiform nuclei, internal capsule, insula, M1-M3 cortex): 7 - Supraganglionic infarction (M4-M6 cortex): 3 Total score (0-10 with 10 being normal): 10 IMPRESSION: 1. No acute cortically based infarct or acute intracranial hemorrhage identified. ASPECTS is 10. 2. Expected evolution of right pontine infarct since December. Chronic white matter disease. 3. These results were communicated to Dr. Rory Percy at 8:32 am on 4/23/2020by text page via the [facility] messaging system. Electronically Signed   By: Genevie Ann M.D.   On: 11/05/2018 08:33    Andre Ishikawa,  DO  [facility] Pager [phone]  If 7PM-7AM, please contact night-coverage [email or web address] Password [email or web address] 11/07/2018, 2:56 PM   LOS: 2 days

## 2018-11-07 NOTE — Progress Notes (Signed)
Pt with hx of afib. Pt converted back to afib with rate in 80s, 90s to 105. Pt with no chest pain/sob. NP made aware with no new order.

## 2018-11-08 LAB — GLUCOSE, CAPILLARY
Glucose-Capillary: 128 mg/dL — ABNORMAL HIGH (ref 70–99)
Glucose-Capillary: 166 mg/dL — ABNORMAL HIGH (ref 70–99)
Glucose-Capillary: 77 mg/dL (ref 70–99)
Glucose-Capillary: 66 mg/dL — ABNORMAL LOW (ref 70–99)

## 2018-11-08 NOTE — Discharge Instructions (Signed)

## 2018-11-08 NOTE — Progress Notes (Signed)
Physical Therapy Treatment Patient Details Name: Andre Wolfe MRN: [id number] DOB: 10-13-1934 Today's Date: 11/08/2018    History of Present Illness Andre Wolfe is a 84 y.o. male with medical history significant of DM; prostate CA; afib on Eliquis; HTN; HLD; CVA in 12/19; and CAD s/p CABG presenting with code stroke. Pt presented with LUE and LLE weakness, MRI: acute R pontine infarct    PT Comments    Pt pleasant and eager to work. Focused on standing and maintaining midline positioning in standing. Pt cont to require mod/maxAx2 for sit to stand. Used stedy today for increased support and to allow for more sit to stand trials. Cont to recommend CIR. Acute PT to cont to follow.   Follow Up Recommendations  CIR     Equipment Recommendations  None recommended by PT    Recommendations for Other Services Rehab consult     Precautions / Restrictions Precautions Precautions: Fall Restrictions Weight Bearing Restrictions: No    Mobility  Bed Mobility               General bed mobility comments: pt up in chair today  Transfers Overall transfer level: Needs assistance Equipment used: Rolling walker (2 wheeled) Transfers: Sit to/from Stand Sit to Stand: Mod assist;Max assist;+2 physical assistance         General transfer comment: attempted to work on sit to stand up to RW however pt fatigued quickly in standing. used the stedy to allow for seated breaks and more support for PT and tech. completed 3 standing trials working on maintaining midline posture instead of leaning to the L. Pt requiring mod tactile cues at L knee to achieve knee extension and prevent buckling. also worked on pulling/pushing with L UE on cross bar of stedy  Ambulation/Gait                 Stairs             Wheelchair Mobility    Modified Rankin (Stroke Patients Only) Modified Rankin (Stroke Patients Only) Pre-Morbid Rankin Score: Moderate disability Modified Rankin: Moderately  severe disability     Balance Overall balance assessment: Needs assistance Sitting-balance support: No upper extremity supported;Feet supported Sitting balance-Leahy Scale: Fair     Standing balance support: Bilateral upper extremity supported Standing balance-Leahy Scale: Poor Standing balance comment: Reliant on BUE support                             Cognition Arousal/Alertness: Awake/alert Behavior During Therapy: WFL for tasks assessed/performed Overall Cognitive Status: Impaired/Different from baseline Area of Impairment: Problem solving                             Problem Solving: Difficulty sequencing;Requires verbal cues General Comments: pt oriented and able to follow all commands today      Exercises      General Comments General comments (skin integrity, edema, etc.): VSS      Pertinent Vitals/Pain Pain Assessment: No/denies pain    Home Living                      Prior Function            PT Goals (current goals can now be found in the care plan section) Progress towards PT goals: Progressing toward goals    Frequency    Min 4X/week  PT Plan Current plan remains appropriate    Co-evaluation              AM-PAC PT "6 Clicks" Mobility   Outcome Measure  Help needed turning from your back to your side while in a flat bed without using bedrails?: A Lot Help needed moving from lying on your back to sitting on the side of a flat bed without using bedrails?: A Lot Help needed moving to and from a bed to a chair (including a wheelchair)?: A Lot Help needed standing up from a chair using your arms (e.g., wheelchair or bedside chair)?: A Lot Help needed to walk in hospital room?: Total Help needed climbing 3-5 steps with a railing? : Total 6 Click Score: 10    End of Session Equipment Utilized During Treatment: Gait belt Activity Tolerance: Patient tolerated treatment well Patient left: in chair;with  call bell/phone within reach;with chair alarm set Nurse Communication: Mobility status PT Visit Diagnosis: Unsteadiness on feet (R26.81);Muscle weakness (generalized) (M62.81);Difficulty in walking, not elsewhere classified (R26.2);Hemiplegia and hemiparesis Hemiplegia - Right/Left: Left Hemiplegia - dominant/non-dominant: Non-dominant Hemiplegia - caused by: Cerebral infarction     Time: 1137-1203 PT Time Calculation (min) (ACUTE ONLY): 26 min  Charges:  $Gait Training: 8-22 mins $Neuromuscular Re-education: 8-22 mins                     Kittie Plater, PT, DPT Acute Rehabilitation Services Pager #: [phone] Office #: [phone]    Andre Wolfe 11/08/2018, 2:52 PM

## 2018-11-08 NOTE — Progress Notes (Signed)
PROGRESS NOTE  Andre Wolfe HWE:[id number] DOB: 09-15-1934 DOA: 11/05/2018 PCP: [name], MD  Brief History:  84 year old male with a history of paroxysmal atrial fibrillation, hypertension, hyperlipidemia, coronary disease, stroke, prostate cancer presenting with onset of left hemiparesis of the left upper extremity and left lower extremity around 6 PM on 11/05/2018.  In addition, the patient also had a sensation of difficulty swallowing around that time as well as some slurred speech.  As result, EMS was activated.  His deficits remained stable in the emergency department.  Neurology was consulted.  CT the brain showed expected evolution of the right pontine infarct.  CT angiogram of the head and neck was negative for LVO.  The patient was admitted for further stroke work-up.  The patient endorses compliance with all his medications. Notably, the patient was recent admitted to the hospital from 07/13/2018 through 07/16/2018 during which she was treated for an acute right pontine infarct.  It was felt to be due to small vessel disease.  He was noted to have paroxysmal atrial fibrillation with RVR at that time.  He was started on apixaban.  He was subsequently discharged to [facility] where he stayed until 08/02/2018.  At baseline, the patient uses a cane and occasionally a walker when he is out of the house.  Assessment/Plan:  Acute ischemic stroke, right pontine POA, minimally improving -Neurology signing off, patient stable for discharge to inpatient rehab per their recommendations -PT/OT evaluation ongoing, pending inpatient rehab evaluation and placement -Patient continues to have left upper extremity weakness although denies any paresthesias or decreased sensation -Speech therapy following, appreciate insight and recommendations -CT brain--expected evolution of the right pontine infarct -MRI brain--recurrent acute right pontine infarct -CTA H&N--no LVO; widespread intracranial ASVD stable  since MRA 06/2018 -Echo--07/14/18--no need for repeat imaging -LDL--40 -HbA1C--8.0 -Antiplatelet--ASA 81+apixaban per neurology recommendations  Insulin dependent diabetes mellitus type 2, poorly controlled -Continue 70/30 insulin -NovoLog sliding scale -11/05/2018 hemoglobin A1c 8.0  Hyperlipidemia -Patient receives Repatha q. 14 days -LDL 40 -Patient carries an allergy to statin in the form of myalgias  Essential hypertension -Previously allowing for permissive hypertension -Resume home medications as indicated with amlodipine and metoprolol tartrate -Hydralazine prn SBP >220  Paroxysmal atrial fibrillation, without RVR -Transitioned into A. fib today although rate controlled, restart metoprolol as above -Continue apixaban -CHADSVASc = 7  Disposition Plan:   CIR once bed has been made available/approved via insurance Family Communication:   Daughter updated on phone 4/24 Consultants:  Neurology Code Status: DNR DVT Prophylaxis:  apixaban  Antibiotics: None  Total time spent 35 minutes.  Greater than 50% spent face to face counseling and coordinating care.  Subjective: Patient denies fevers, chills, headache, chest pain, dyspnea, nausea, vomiting, diarrhea, abdominal pain, dysuria, hematuria, hematochezia, and melena.   Objective: Vitals:   11/07/18 2351 11/08/18 0420 11/08/18 0821 11/08/18 1206  BP: (!) 143/85 128/74 136/79 (!) 105/58  Pulse: 84 91 65 62  Resp: 17 17 16 20   Temp: 98.4 F (36.9 C) 98.2 F (36.8 C) 98.1 F (36.7 C) 97.8 F (36.6 C)  TempSrc: Oral Oral Oral Oral  SpO2: 96% 97% 98% 99%  Weight:      Height:        Intake/Output Summary (Last 24 hours) at 11/08/2018 1346 Last data filed at 11/08/2018 1008 Gross per 24 hour  Intake 420 ml  Output 650 ml  Net -230 ml   Weight change:  Exam:  General:  Pleasantly resting in bed, No acute distress. HEENT:  Normocephalic atraumatic.  Sclerae nonicteric, noninjected.  Extraocular movements  intact bilaterally. Neck:  Without mass or deformity.  Trachea is midline. Lungs:  Clear to auscultate bilaterally without rhonchi, wheeze, or rales. Heart:  Regular rate and rhythm.  Without murmurs, rubs, or gallops. Abdomen:  Soft, nontender, nondistended.  Without guarding or rebound. Extremities: Without cyanosis, clubbing, edema, or obvious deformity.  Left upper extremity strength 1 out of 5, left lower extremity strength 4 out of 5; right upper and lower extremity 5 out of 5; patient intact globally Vascular:  Dorsalis pedis and posterior tibial pulses palpable bilaterally. Skin:  Warm and dry, no erythema, no ulcerations.  Data Reviewed: I have personally reviewed following labs and imaging studies Basic Metabolic Panel: Recent Labs  Lab 11/05/18 0819 11/05/18 0831 11/06/18 1039  NA 137  --  136  K 4.8  --  3.9  CL 103  --  101  CO2 24  --  24  GLUCOSE 188*  --  278*  BUN 15  --  16  CREATININE 1.08 1.00 1.11  CALCIUM 9.3  --  9.9   Liver Function Tests: Recent Labs  Lab 11/05/18 0819  AST 25  ALT 25  ALKPHOS 52  BILITOT 1.1  PROT 6.9  ALBUMIN 3.7   No results for input(s): LIPASE, AMYLASE in the last 168 hours. No results for input(s): AMMONIA in the last 168 hours. Coagulation Profile: Recent Labs  Lab 11/05/18 0819  INR 1.3*   CBC: Recent Labs  Lab 11/05/18 0819  WBC 7.4  NEUTROABS 4.9  HGB 15.9  HCT 46.4  MCV 86.2  PLT 211   Cardiac Enzymes: No results for input(s): CKTOTAL, CKMB, CKMBINDEX, TROPONINI in the last 168 hours. BNP: Invalid input(s): POCBNP CBG: Recent Labs  Lab 11/07/18 0633 11/07/18 1156 11/07/18 1633 11/08/18 0651 11/08/18 1211  GLUCAP 152* 275* 74 128* 166*   HbA1C: No results for input(s): HGBA1C in the last 72 hours. Urine analysis:    Component Value Date/Time   COLORURINE YELLOW 07/13/2018 [place] 07/13/2018 1321   LABSPEC 1.028 07/13/2018 1321   PHURINE 5.0 07/13/2018 1321   GLUCOSEU  >=500 (A) 07/13/2018 1321   HGBUR NEGATIVE 07/13/2018 1321   [place] 07/13/2018 1321   KETONESUR NEGATIVE 07/13/2018 1321   PROTEINUR 30 (A) 07/13/2018 1321   NITRITE NEGATIVE 07/13/2018 1321   LEUKOCYTESUR NEGATIVE 07/13/2018 1321   Sepsis Labs: @LABRCNTIP (procalcitonin:4,lacticidven:4) )No results found for this or any previous visit (from the past 240 hour(s)).   Scheduled Meds:  amLODipine  2.5 mg Oral Daily   apixaban  5 mg Oral BID   aspirin EC  81 mg Oral Daily   feeding supplement (ENSURE ENLIVE)  237 mL Oral BID BM   FLUoxetine  20 mg Oral Daily   gabapentin  600 mg Oral QHS   insulin aspart  0-15 Units Subcutaneous TID WC   insulin aspart  0-5 Units Subcutaneous QHS   insulin aspart protamine- aspart  10 Units Subcutaneous QAC lunch   insulin aspart protamine- aspart  20 Units Subcutaneous Q supper   insulin aspart protamine- aspart  30 Units Subcutaneous Q breakfast   metoprolol tartrate  12.5 mg Oral BID   pantoprazole  40 mg Oral Daily   Continuous Infusions:   Procedures/Studies: Ct Angio Head W Or Wo Contrast  Result Date: 11/05/2018 CLINICAL DATA:  Code stroke.  84 year old male. EXAM: CT ANGIOGRAPHY  HEAD AND NECK TECHNIQUE: Multidetector CT imaging of the head and neck was performed using the standard protocol during bolus administration of intravenous contrast. Multiplanar CT image reconstructions and MIPs were obtained to evaluate the vascular anatomy. Carotid stenosis measurements (when applicable) are obtained utilizing NASCET criteria, using the distal internal carotid diameter as the denominator. CONTRAST:  34mL OMNIPAQUE IOHEXOL 350 MG/ML SOLN COMPARISON:  Head CT without contrast 0824 hours today. MRI and intracranial MRA 07/13/2018. FINDINGS: CTA NECK Skeleton: Prior sternotomy. No acute osseous abnormality identified. Upper chest: Centrilobular emphysema. No superior mediastinal lymphadenopathy. Other neck: Mild thyroid goiter on  the right. Small 12 millimeter left parotid gland nodule is stable since December (series 9, image 163) and head hypercellular features on MRI. Other neck soft tissues are within normal limits. Aortic arch: Calcified aortic atherosclerosis. 3 vessel arch configuration. Right carotid system: No brachiocephalic or right CCA origin stenosis despite plaque. Soft and calcified plaque at the right ICA origin and bulb with less than 50 % stenosis with respect to the distal vessel. Left carotid system: No left CCA origin stenosis. Mildly tortuous left CCA. Soft and calcified plaque at the left ICA origin and bulb with less than 50 % stenosis with respect to the distal vessel. Vertebral arteries: Tortuous proximal right subclavian artery with soft and calcified plaque but no stenosis. Normal right vertebral artery origin. Patent right vertebral artery to the skull base without stenosis. Mildly tortuous proximal left subclavian artery with soft and calcified plaque but no stenosis. Patent left vertebral artery origin without stenosis. Mild left V1 calcified plaque and tortuosity. Left V2 segment irregularity intermittently throughout the neck suggesting multifocal soft plaque, but no hemodynamically significant stenosis identified to the skull base. CTA HEAD Posterior circulation: Codominant distal vertebral arteries without stenosis. Patent vertebrobasilar junction and PICA origins. Patent basilar artery without stenosis. Normal SCA and right PCA origins. Moderate to severe irregularity and stenosis redemonstrated at the left PCA origin and stable from the prior MRA. The left P2 segment is mildly irregular but the other left PCA branches are within normal limits. There is contralateral moderate to severe right P2 segment stenosis which appears progressed since December, but with preserved distal right PCA enhancement. Anterior circulation: Both ICA siphons are patent. Extensive left siphon plaque including bulky calcified  plaque in the cavernous and proximal supraclinoid segments and soft plaque in the petrous segment. Mild to moderate subsequent left ICA stenosis appears stable since December. Normal left ophthalmic artery origin. Posterior communicating arteries are diminutive or absent. Right ICA siphon similar soft and calcified plaque with mild to moderate stenosis stable since December. Normal left ophthalmic artery origin. Patent carotid termini. Dominant left and diminutive or absent right ACA A1 segments. Normal left A1, anterior communicating artery and bilateral ACA branches. Left MCA M1 segment and trifurcation are patent with mild irregularity that appears stable since December. Left MCA branches are stable. Right MCA M1 and bifurcation are patent and appear stable since December. Right MCA branches appear stable. Venous sinuses: Not well evaluated due to early contrast timing Anatomic variants: Dominant left ACA A1. Bovine arch configuration. Review of the MIP images confirms the above findings IMPRESSION: 1. Negative for emergent large vessel occlusion. 2. Widespread intracranial atherosclerosis which appears largely stable since the December MRA. - possible increased moderate to severe Right PCA P2 stenosis. - chronic severe Left PCA origin stenosis. - up to moderate bilateral ICA siphon stenosis. 3. Atherosclerosis in the neck and Aortic Atherosclerosis (ICD10-I70.0). No significant cervical carotid or  vertebral artery stenosis identified. 4. 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 Recommend outpatient ENT follow-up. Salient findings communicated to Dr. Rory Percy at [phone] hours on 11/05/2018 by text page via the [facility] messaging system. Electronically Signed   By: Genevie Ann M.D.   On: 11/05/2018 08:50   Ct Angio Neck W Or Wo Contrast  Result Date: 11/05/2018 CLINICAL DATA:  Code stroke.  84 year old male. EXAM: CT ANGIOGRAPHY HEAD AND NECK TECHNIQUE: Multidetector CT  imaging of the head and neck was performed using the standard protocol during bolus administration of intravenous contrast. Multiplanar CT image reconstructions and MIPs were obtained to evaluate the vascular anatomy. Carotid stenosis measurements (when applicable) are obtained utilizing NASCET criteria, using the distal internal carotid diameter as the denominator. CONTRAST:  72mL OMNIPAQUE IOHEXOL 350 MG/ML SOLN COMPARISON:  Head CT without contrast 0824 hours today. MRI and intracranial MRA 07/13/2018. FINDINGS: CTA NECK Skeleton: Prior sternotomy. No acute osseous abnormality identified. Upper chest: Centrilobular emphysema. No superior mediastinal lymphadenopathy. Other neck: Mild thyroid goiter on the right. Small 12 millimeter left parotid gland nodule is stable since December (series 9, image 163) and head hypercellular features on MRI. Other neck soft tissues are within normal limits. Aortic arch: Calcified aortic atherosclerosis. 3 vessel arch configuration. Right carotid system: No brachiocephalic or right CCA origin stenosis despite plaque. Soft and calcified plaque at the right ICA origin and bulb with less than 50 % stenosis with respect to the distal vessel. Left carotid system: No left CCA origin stenosis. Mildly tortuous left CCA. Soft and calcified plaque at the left ICA origin and bulb with less than 50 % stenosis with respect to the distal vessel. Vertebral arteries: Tortuous proximal right subclavian artery with soft and calcified plaque but no stenosis. Normal right vertebral artery origin. Patent right vertebral artery to the skull base without stenosis. Mildly tortuous proximal left subclavian artery with soft and calcified plaque but no stenosis. Patent left vertebral artery origin without stenosis. Mild left V1 calcified plaque and tortuosity. Left V2 segment irregularity intermittently throughout the neck suggesting multifocal soft plaque, but no hemodynamically significant stenosis  identified to the skull base. CTA HEAD Posterior circulation: Codominant distal vertebral arteries without stenosis. Patent vertebrobasilar junction and PICA origins. Patent basilar artery without stenosis. Normal SCA and right PCA origins. Moderate to severe irregularity and stenosis redemonstrated at the left PCA origin and stable from the prior MRA. The left P2 segment is mildly irregular but the other left PCA branches are within normal limits. There is contralateral moderate to severe right P2 segment stenosis which appears progressed since December, but with preserved distal right PCA enhancement. Anterior circulation: Both ICA siphons are patent. Extensive left siphon plaque including bulky calcified plaque in the cavernous and proximal supraclinoid segments and soft plaque in the petrous segment. Mild to moderate subsequent left ICA stenosis appears stable since December. Normal left ophthalmic artery origin. Posterior communicating arteries are diminutive or absent. Right ICA siphon similar soft and calcified plaque with mild to moderate stenosis stable since December. Normal left ophthalmic artery origin. Patent carotid termini. Dominant left and diminutive or absent right ACA A1 segments. Normal left A1, anterior communicating artery and bilateral ACA branches. Left MCA M1 segment and trifurcation are patent with mild irregularity that appears stable since December. Left MCA branches are stable. Right MCA M1 and bifurcation are patent and appear stable since December. Right MCA branches appear stable. Venous sinuses: Not well evaluated  due to early contrast timing Anatomic variants: Dominant left ACA A1. Bovine arch configuration. Review of the MIP images confirms the above findings IMPRESSION: 1. Negative for emergent large vessel occlusion. 2. Widespread intracranial atherosclerosis which appears largely stable since the December MRA. - possible increased moderate to severe Right PCA P2 stenosis. -  chronic severe Left PCA origin stenosis. - up to moderate bilateral ICA siphon stenosis. 3. Atherosclerosis in the neck and Aortic Atherosclerosis (ICD10-I70.0). No significant cervical carotid or vertebral artery stenosis identified. 4. 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 Recommend outpatient ENT follow-up. Salient findings communicated to Dr. Rory Percy at [phone] hours on 11/05/2018 by text page via the [facility] messaging system. Electronically Signed   By: Genevie Ann M.D.   On: 11/05/2018 08:50   Mr Brain Wo Contrast (neuro Protocol)  Result Date: 11/05/2018 CLINICAL DATA:  Left-sided weakness and dysarthria.  Prior stroke. EXAM: MRI HEAD WITHOUT CONTRAST TECHNIQUE: Multiplanar, multiecho pulse sequences of the brain and surrounding structures were obtained without intravenous contrast. COMPARISON:  Head CT and CTA 11/05/2018 and MRI 07/13/2018 FINDINGS: Brain: There is a recurrent acute right pontine infarct, smaller than that on the prior MRI. The acute ischemia is located along the margins of the old infarct and predominantly posterior to it. Diffusion abnormality associated with the punctate left parietal infarct on the prior MRI has resolved. Patchy T2 hyperintensities in the cerebral white matter are unchanged and nonspecific but compatible with chronic small vessel ischemic disease, mildly advanced for age. Mild cerebral atrophy for age is also unchanged. No intracranial hemorrhage, mass, midline shift, or extra-axial fluid collection is identified. Vascular: Major intracranial vascular flow voids are preserved. Skull and upper cervical spine: Unremarkable bone marrow signal. Sinuses/Orbits: Bilateral cataract extraction. Mild left maxillary sinus mucosal thickening. Clear mastoid air cells. Other: None. IMPRESSION: 1. Recurrent acute right pontine infarct. 2. Mild chronic small vessel ischemic disease in the cerebral white matter. Electronically Signed   By:  Logan Bores M.D.   On: 11/05/2018 11:50   Ct Head Code Stroke Wo Contrast  Result Date: 11/05/2018 CLINICAL DATA:  Code stroke. 84 year old male. History of small vessel infarcts in 2019. EXAM: CT HEAD WITHOUT CONTRAST TECHNIQUE: Contiguous axial images were obtained from the base of the skull through the vertex without intravenous contrast. COMPARISON:  Brain MRI and head CT 07/13/2018 FINDINGS: Brain: Stable cerebral volume. No midline shift, mass effect, or evidence of intracranial mass lesion. No acute intracranial hemorrhage identified. No ventriculomegaly. Evolved right pontine infarct seen in December. Patchy bilateral cerebral white matter hypodensity. Asymmetric left occipital lobe sulcus or less likely chronic encephalomalacia is stable. Stable gray-white matter differentiation throughout the brain. No cortically based acute infarct identified. Vascular: Calcified atherosclerosis at the skull base. No suspicious intracranial vascular hyperdensity. Skull: No acute osseous abnormality identified. Sinuses/Orbits: Visualized paranasal sinuses and mastoids are stable and well pneumatized. Other: No acute orbit or scalp soft tissue finding. ASPECTS [facility] Stroke Program Early CT Score) - Ganglionic level infarction (caudate, lentiform nuclei, internal capsule, insula, M1-M3 cortex): 7 - Supraganglionic infarction (M4-M6 cortex): 3 Total score (0-10 with 10 being normal): 10 IMPRESSION: 1. No acute cortically based infarct or acute intracranial hemorrhage identified. ASPECTS is 10. 2. Expected evolution of right pontine infarct since December. Chronic white matter disease. 3. These results were communicated to Dr. Rory Percy at 8:32 am on 4/23/2020by text page via the [facility] messaging system. Electronically Signed   By: Genevie Ann  M.D.   On: 11/05/2018 08:33    Little Ishikawa, DO  [facility] Pager [phone]  If 7PM-7AM, please contact night-coverage [email or web address] Password [email or web address] 11/08/2018,  1:46 PM   LOS: 3 days

## 2018-11-09 ENCOUNTER — Other Ambulatory Visit: Payer: Self-pay

## 2018-11-09 ENCOUNTER — Encounter (HOSPITAL_COMMUNITY): Payer: Self-pay

## 2018-11-09 ENCOUNTER — Inpatient Hospital Stay (HOSPITAL_COMMUNITY)
Admission: RE | Admit: 2018-11-09 | Discharge: 2018-12-03 | DRG: 057 | Disposition: A | Discharge status: Skilled Nursing Facility | Payer: Medicare Other | Source: Intra-hospital | Attending: Physical Medicine & Rehabilitation | Admitting: Physical Medicine & Rehabilitation

## 2018-11-09 DIAGNOSIS — Z888 Allergy status to other drugs, medicaments and biological substances status: Secondary | ICD-10-CM

## 2018-11-09 DIAGNOSIS — E11649 Type 2 diabetes mellitus with hypoglycemia without coma: Secondary | ICD-10-CM | POA: Diagnosis present

## 2018-11-09 DIAGNOSIS — I6350 Cerebral infarction due to unspecified occlusion or stenosis of unspecified cerebral artery: Secondary | ICD-10-CM | POA: Diagnosis not present

## 2018-11-09 DIAGNOSIS — N179 Acute kidney failure, unspecified: Secondary | ICD-10-CM

## 2018-11-09 DIAGNOSIS — K219 Gastro-esophageal reflux disease without esophagitis: Secondary | ICD-10-CM | POA: Diagnosis present

## 2018-11-09 DIAGNOSIS — W06XXXA Fall from bed, initial encounter: Secondary | ICD-10-CM | POA: Diagnosis not present

## 2018-11-09 DIAGNOSIS — I69998 Other sequelae following unspecified cerebrovascular disease: Secondary | ICD-10-CM

## 2018-11-09 DIAGNOSIS — E162 Hypoglycemia, unspecified: Secondary | ICD-10-CM

## 2018-11-09 DIAGNOSIS — Z1159 Encounter for screening for other viral diseases: Secondary | ICD-10-CM

## 2018-11-09 DIAGNOSIS — Z794 Long term (current) use of insulin: Secondary | ICD-10-CM

## 2018-11-09 DIAGNOSIS — Z833 Family history of diabetes mellitus: Secondary | ICD-10-CM | POA: Diagnosis not present

## 2018-11-09 DIAGNOSIS — I10 Essential (primary) hypertension: Secondary | ICD-10-CM | POA: Diagnosis present

## 2018-11-09 DIAGNOSIS — N401 Enlarged prostate with lower urinary tract symptoms: Secondary | ICD-10-CM | POA: Diagnosis not present

## 2018-11-09 DIAGNOSIS — F418 Other specified anxiety disorders: Secondary | ICD-10-CM | POA: Diagnosis present

## 2018-11-09 DIAGNOSIS — I69354 Hemiplegia and hemiparesis following cerebral infarction affecting left non-dominant side: Secondary | ICD-10-CM | POA: Diagnosis not present

## 2018-11-09 DIAGNOSIS — I480 Paroxysmal atrial fibrillation: Secondary | ICD-10-CM | POA: Diagnosis present

## 2018-11-09 DIAGNOSIS — E785 Hyperlipidemia, unspecified: Secondary | ICD-10-CM | POA: Diagnosis present

## 2018-11-09 DIAGNOSIS — R7309 Other abnormal glucose: Secondary | ICD-10-CM

## 2018-11-09 DIAGNOSIS — R262 Difficulty in walking, not elsewhere classified: Secondary | ICD-10-CM | POA: Diagnosis not present

## 2018-11-09 DIAGNOSIS — R278 Other lack of coordination: Secondary | ICD-10-CM | POA: Diagnosis not present

## 2018-11-09 DIAGNOSIS — E1142 Type 2 diabetes mellitus with diabetic polyneuropathy: Secondary | ICD-10-CM | POA: Diagnosis present

## 2018-11-09 DIAGNOSIS — I69392 Facial weakness following cerebral infarction: Secondary | ICD-10-CM

## 2018-11-09 DIAGNOSIS — E7800 Pure hypercholesterolemia, unspecified: Secondary | ICD-10-CM | POA: Diagnosis present

## 2018-11-09 DIAGNOSIS — N319 Neuromuscular dysfunction of bladder, unspecified: Secondary | ICD-10-CM | POA: Diagnosis present

## 2018-11-09 DIAGNOSIS — Z87891 Personal history of nicotine dependence: Secondary | ICD-10-CM

## 2018-11-09 DIAGNOSIS — G8194 Hemiplegia, unspecified affecting left nondominant side: Secondary | ICD-10-CM | POA: Diagnosis not present

## 2018-11-09 DIAGNOSIS — I63219 Cerebral infarction due to unspecified occlusion or stenosis of unspecified vertebral arteries: Secondary | ICD-10-CM | POA: Diagnosis not present

## 2018-11-09 DIAGNOSIS — I69322 Dysarthria following cerebral infarction: Secondary | ICD-10-CM | POA: Diagnosis not present

## 2018-11-09 DIAGNOSIS — Z8659 Personal history of other mental and behavioral disorders: Secondary | ICD-10-CM | POA: Diagnosis not present

## 2018-11-09 DIAGNOSIS — Z951 Presence of aortocoronary bypass graft: Secondary | ICD-10-CM

## 2018-11-09 DIAGNOSIS — Z8249 Family history of ischemic heart disease and other diseases of the circulatory system: Secondary | ICD-10-CM

## 2018-11-09 DIAGNOSIS — I2510 Atherosclerotic heart disease of native coronary artery without angina pectoris: Secondary | ICD-10-CM | POA: Diagnosis present

## 2018-11-09 DIAGNOSIS — Z7901 Long term (current) use of anticoagulants: Secondary | ICD-10-CM | POA: Diagnosis not present

## 2018-11-09 DIAGNOSIS — E119 Type 2 diabetes mellitus without complications: Secondary | ICD-10-CM

## 2018-11-09 DIAGNOSIS — F329 Major depressive disorder, single episode, unspecified: Secondary | ICD-10-CM | POA: Diagnosis present

## 2018-11-09 DIAGNOSIS — F339 Major depressive disorder, recurrent, unspecified: Secondary | ICD-10-CM | POA: Diagnosis not present

## 2018-11-09 DIAGNOSIS — R339 Retention of urine, unspecified: Secondary | ICD-10-CM | POA: Diagnosis present

## 2018-11-09 DIAGNOSIS — S0990XA Unspecified injury of head, initial encounter: Secondary | ICD-10-CM | POA: Diagnosis not present

## 2018-11-09 DIAGNOSIS — Z8546 Personal history of malignant neoplasm of prostate: Secondary | ICD-10-CM

## 2018-11-09 DIAGNOSIS — M6281 Muscle weakness (generalized): Secondary | ICD-10-CM | POA: Diagnosis not present

## 2018-11-09 DIAGNOSIS — E0840 Diabetes mellitus due to underlying condition with diabetic neuropathy, unspecified: Secondary | ICD-10-CM | POA: Diagnosis not present

## 2018-11-09 DIAGNOSIS — Z79899 Other long term (current) drug therapy: Secondary | ICD-10-CM | POA: Diagnosis not present

## 2018-11-09 DIAGNOSIS — K5900 Constipation, unspecified: Secondary | ICD-10-CM | POA: Diagnosis not present

## 2018-11-09 HISTORY — DX: Hypoglycemia, unspecified: E16.2

## 2018-11-09 LAB — GLUCOSE, CAPILLARY
Glucose-Capillary: 132 mg/dL — ABNORMAL HIGH (ref 70–99)
Glucose-Capillary: 264 mg/dL — ABNORMAL HIGH (ref 70–99)
Glucose-Capillary: 35 mg/dL — CL (ref 70–99)
Glucose-Capillary: 53 mg/dL — ABNORMAL LOW (ref 70–99)
Glucose-Capillary: 98 mg/dL (ref 70–99)
Glucose-Capillary: 217 mg/dL — ABNORMAL HIGH (ref 70–99)

## 2018-11-09 MED ORDER — ACETAMINOPHEN 325 MG PO TABS
325.0000 mg | ORAL_TABLET | ORAL | Status: DC | PRN
  Administered 2018-11-16 – 2018-11-30 (×4): 650 mg via ORAL
  Filled 2018-11-09 (×4): qty 2

## 2018-11-09 MED ORDER — INSULIN ASPART PROT & ASPART (70-30 MIX) 100 UNIT/ML ~~LOC~~ SUSP
35.0000 [IU] | Freq: Every day | SUBCUTANEOUS | Status: DC
  Administered 2018-11-10 – 2018-12-03 (×23): 35 [IU] via SUBCUTANEOUS

## 2018-11-09 MED ORDER — FLUOXETINE HCL 20 MG PO CAPS
20.0000 mg | ORAL_CAPSULE | Freq: Every day | ORAL | Status: DC
  Administered 2018-11-10 – 2018-12-03 (×24): 20 mg via ORAL
  Filled 2018-11-09 (×24): qty 1

## 2018-11-09 MED ORDER — AMLODIPINE BESYLATE 2.5 MG PO TABS
2.5000 mg | ORAL_TABLET | Freq: Every day | ORAL | Status: DC
  Administered 2018-11-10 – 2018-11-14 (×5): 2.5 mg via ORAL
  Filled 2018-11-09 (×5): qty 1

## 2018-11-09 MED ORDER — INSULIN ASPART PROT & ASPART (70-30 MIX) 100 UNIT/ML ~~LOC~~ SUSP
10.0000 [IU] | Freq: Every day | SUBCUTANEOUS | Status: DC
  Filled 2018-11-09: qty 10

## 2018-11-09 MED ORDER — PROCHLORPERAZINE EDISYLATE 10 MG/2ML IJ SOLN: 5.0000 mg | Freq: Four times a day (QID) | INTRAMUSCULAR | Status: DC | PRN

## 2018-11-09 MED ORDER — METOPROLOL TARTRATE 12.5 MG HALF TABLET
12.5000 mg | ORAL_TABLET | Freq: Two times a day (BID) | ORAL | Status: DC
  Administered 2018-11-09 – 2018-12-03 (×45): 12.5 mg via ORAL
  Filled 2018-11-09 (×47): qty 1

## 2018-11-09 MED ORDER — INSULIN ASPART PROT & ASPART (70-30 MIX) 100 UNIT/ML ~~LOC~~ SUSP
5.0000 [IU] | Freq: Every day | SUBCUTANEOUS | Status: DC
  Administered 2018-11-10 – 2018-11-14 (×5): 5 [IU] via SUBCUTANEOUS
  Filled 2018-11-09: qty 10

## 2018-11-09 MED ORDER — INSULIN ASPART PROT & ASPART (70-30 MIX) 100 UNIT/ML ~~LOC~~ SUSP
20.0000 [IU] | Freq: Every day | SUBCUTANEOUS | Status: DC
  Administered 2018-11-10 – 2018-11-13 (×4): 20 [IU] via SUBCUTANEOUS

## 2018-11-09 MED ORDER — ENSURE ENLIVE PO LIQD: 237.0000 mL | Freq: Two times a day (BID) | ORAL | 12 refills | Status: AC

## 2018-11-09 MED ORDER — PANTOPRAZOLE SODIUM 40 MG PO TBEC
40.0000 mg | DELAYED_RELEASE_TABLET | Freq: Every day | ORAL | Status: DC
  Administered 2018-11-10 – 2018-12-03 (×24): 40 mg via ORAL
  Filled 2018-11-09 (×24): qty 1

## 2018-11-09 MED ORDER — PROCHLORPERAZINE MALEATE 5 MG PO TABS: 5.0000 mg | ORAL_TABLET | Freq: Four times a day (QID) | ORAL | Status: DC | PRN

## 2018-11-09 MED ORDER — FLUOXETINE HCL 20 MG PO CAPS: 20.0000 mg | ORAL_CAPSULE | Freq: Every day | ORAL | 3 refills | Status: AC

## 2018-11-09 MED ORDER — ENSURE ENLIVE PO LIQD
237.0000 mL | Freq: Two times a day (BID) | ORAL | Status: DC
  Administered 2018-11-10 – 2018-12-01 (×25): 237 mL via ORAL

## 2018-11-09 MED ORDER — LIDOCAINE HCL URETHRAL/MUCOSAL 2 % EX GEL
CUTANEOUS | Status: DC | PRN
  Filled 2018-11-09: qty 5

## 2018-11-09 MED ORDER — INSULIN ASPART PROT & ASPART (70-30 MIX) 100 UNIT/ML ~~LOC~~ SUSP
15.0000 [IU] | Freq: Once | SUBCUTANEOUS | Status: AC
  Administered 2018-11-09: 22:00:00 15 [IU] via SUBCUTANEOUS
  Filled 2018-11-09: qty 10

## 2018-11-09 MED ORDER — POLYETHYLENE GLYCOL 3350 17 G PO PACK
17.0000 g | PACK | Freq: Every day | ORAL | Status: DC | PRN
  Administered 2018-11-18: 17 g via ORAL
  Filled 2018-11-09 (×4): qty 1

## 2018-11-09 MED ORDER — GUAIFENESIN-DM 100-10 MG/5ML PO SYRP: 5.0000 mL | ORAL_SOLUTION | Freq: Four times a day (QID) | ORAL | Status: DC | PRN

## 2018-11-09 MED ORDER — INSULIN ASPART 100 UNIT/ML ~~LOC~~ SOLN
0.0000 [IU] | Freq: Every day | SUBCUTANEOUS | Status: DC
  Administered 2018-11-09: 22:00:00 2 [IU] via SUBCUTANEOUS

## 2018-11-09 MED ORDER — DIPHENHYDRAMINE HCL 12.5 MG/5ML PO ELIX: 12.5000 mg | ORAL_SOLUTION | Freq: Four times a day (QID) | ORAL | Status: DC | PRN

## 2018-11-09 MED ORDER — SENNOSIDES-DOCUSATE SODIUM 8.6-50 MG PO TABS
1.0000 | ORAL_TABLET | Freq: Every evening | ORAL | Status: DC | PRN
  Administered 2018-11-14: 1 via ORAL
  Filled 2018-11-09 (×3): qty 1

## 2018-11-09 MED ORDER — INSULIN ASPART 100 UNIT/ML ~~LOC~~ SOLN
0.0000 [IU] | Freq: Three times a day (TID) | SUBCUTANEOUS | Status: DC
  Administered 2018-11-10 (×2): 5 [IU] via SUBCUTANEOUS
  Administered 2018-11-10: 2 [IU] via SUBCUTANEOUS
  Administered 2018-11-11: 3 [IU] via SUBCUTANEOUS
  Administered 2018-11-11: 8 [IU] via SUBCUTANEOUS
  Administered 2018-11-11: 2 [IU] via SUBCUTANEOUS
  Administered 2018-11-12: 3 [IU] via SUBCUTANEOUS
  Administered 2018-11-12 – 2018-11-13 (×2): 2 [IU] via SUBCUTANEOUS
  Administered 2018-11-14: 12:00:00 3 [IU] via SUBCUTANEOUS
  Administered 2018-11-14 – 2018-11-16 (×3): 2 [IU] via SUBCUTANEOUS
  Administered 2018-11-18: 3 [IU] via SUBCUTANEOUS
  Administered 2018-11-19 – 2018-11-21 (×4): 2 [IU] via SUBCUTANEOUS
  Administered 2018-11-22: 3 [IU] via SUBCUTANEOUS
  Administered 2018-11-23 – 2018-11-24 (×3): 2 [IU] via SUBCUTANEOUS
  Administered 2018-11-25 – 2018-11-26 (×2): 3 [IU] via SUBCUTANEOUS

## 2018-11-09 MED ORDER — ASPIRIN 81 MG PO TBEC: 81.0000 mg | DELAYED_RELEASE_TABLET | Freq: Every day | ORAL | 0 refills | Status: AC

## 2018-11-09 MED ORDER — FLEET ENEMA 7-19 GM/118ML RE ENEM: 1.0000 | ENEMA | Freq: Once | RECTAL | Status: DC | PRN

## 2018-11-09 MED ORDER — TRAZODONE HCL 50 MG PO TABS
25.0000 mg | ORAL_TABLET | Freq: Every evening | ORAL | Status: DC | PRN
  Administered 2018-11-30: 50 mg via ORAL
  Filled 2018-11-09: qty 1

## 2018-11-09 MED ORDER — GABAPENTIN 300 MG PO CAPS
600.0000 mg | ORAL_CAPSULE | Freq: Every day | ORAL | Status: DC
  Administered 2018-11-09 – 2018-12-02 (×24): 600 mg via ORAL
  Filled 2018-11-09 (×24): qty 2

## 2018-11-09 MED ORDER — METOPROLOL TARTRATE 25 MG PO TABS: 12.5000 mg | ORAL_TABLET | Freq: Two times a day (BID) | ORAL | 0 refills | Status: DC

## 2018-11-09 MED ORDER — ASPIRIN EC 81 MG PO TBEC
81.0000 mg | DELAYED_RELEASE_TABLET | Freq: Every day | ORAL | Status: DC
  Administered 2018-11-10 – 2018-12-03 (×24): 81 mg via ORAL
  Filled 2018-11-09 (×24): qty 1

## 2018-11-09 MED ORDER — INSULIN ASPART PROT & ASPART (70-30 MIX) 100 UNIT/ML ~~LOC~~ SUSP: 30.0000 [IU] | Freq: Every day | SUBCUTANEOUS | Status: DC

## 2018-11-09 MED ORDER — POLYETHYLENE GLYCOL 3350 17 G PO PACK
17.0000 g | PACK | Freq: Every day | ORAL | Status: DC
  Administered 2018-11-09 – 2018-11-19 (×4): 17 g via ORAL
  Filled 2018-11-09 (×17): qty 1

## 2018-11-09 MED ORDER — ALUM & MAG HYDROXIDE-SIMETH 200-200-20 MG/5ML PO SUSP
30.0000 mL | ORAL | Status: DC | PRN
  Filled 2018-11-09: qty 30

## 2018-11-09 MED ORDER — APIXABAN 5 MG PO TABS
5.0000 mg | ORAL_TABLET | Freq: Two times a day (BID) | ORAL | Status: DC
  Administered 2018-11-09 – 2018-12-03 (×48): 5 mg via ORAL
  Filled 2018-11-09 (×48): qty 1

## 2018-11-09 MED ORDER — PROCHLORPERAZINE 25 MG RE SUPP: 12.5000 mg | Freq: Four times a day (QID) | RECTAL | Status: DC | PRN

## 2018-11-09 MED ORDER — BISACODYL 10 MG RE SUPP: 10.0000 mg | Freq: Every day | RECTAL | Status: DC | PRN

## 2018-11-09 NOTE — H&P (Signed)
Physical Medicine and Rehabilitation Admission H&P        Chief Complaint  Patient presents with  . Functional decline.       HPI: Andre Wolfe is an 84 year old male with history of HTN, CAD, T2DM with peripheral neuropathy and gait disorder, PAF s/p DCCV, CVA 06/2018 with CIR stay and d/c to home at supervision level. He was readmitted on 11/05/18 with one day history of  Left facial droop with dysarthria and left facial weakness. He refused to come in due to COVID 19 but symptoms did not resolve therefore was brought in as code stroke. CTA head/neck was negative for LVO and showed stable widespread intracranial atherosclerosis with chronic severe L-PCA origin stenosis. Marland Kitchen MRI brain done revealing recurrent right pontine stroke and Dr. Erlinda Hong recommended adding  ASA  to Eliquis for stroke felt to be due to small vessel disease. Prozac added for reactive depression with anxiety. Patient with resultant left facial weakness with mild dysarthria and left sided weakness affecting mobility and ADLs. CIR recommended due to functional decline.      Review of Systems  Constitutional: Negative for fever.  HENT: Negative for hearing loss.   Eyes: Negative for blurred vision.  Respiratory: Negative for cough.   Cardiovascular: Negative for chest pain.  Gastrointestinal: Negative for nausea and vomiting.  Genitourinary: Positive for frequency.       Urine retention--caths 3 x day  Musculoskeletal: Negative for myalgias and neck pain.  Skin: Negative for rash.  Neurological: Positive for speech change and focal weakness. Negative for tingling, sensory change and headaches.  Psychiatric/Behavioral: Negative for depression.          Past Medical History:  Diagnosis Date  . Arthritis      "joints" (07/13/2018)  . Coronary artery disease      a. s/p CABG 12/2015.  Marland Kitchen CVA (cerebral vascular accident) ([place]) 07/13/2018    right pontine infarct; "said he's had one in the past; probably 2 yrs ago; same  symptoms" (07/13/2018)  . Diabetic peripheral neuropathy ([place])    . GERD (gastroesophageal reflux disease)    . Hyperlipidemia    . Hypertension    . Postoperative atrial fibrillation ([place])    . Prostate cancer ([place])      d'x 2015/2016  . TIA (transient ischemic attack)    . Type II diabetes mellitus ([place])             Past Surgical History:  Procedure Laterality Date  . CARDIAC CATHETERIZATION   2017    "not completed; went straight to [facility]"  . CATARACT EXTRACTION W/ INTRAOCULAR LENS  IMPLANT, BILATERAL Bilateral    . CORONARY ARTERY BYPASS GRAFT   2017    "CABG X 4"  . TONSILLECTOMY               Family History  Problem Relation Age of Onset  . Heart disease Mother    . Breast cancer Mother    . Leukemia Father    . Diabetes Brother    . Breast cancer Sister    . Stroke Neg Hx        Social History:  Married. Independent with walker PTA. He  reports that he quit smoking about 4 months ago. He has a 7.80 pack-year smoking history. He has never used smokeless tobacco. He reports previous alcohol use. He reports that he does not use drugs.          Allergies  Allergen Reactions  . Crestor [Rosuvastatin Calcium]    . Lipitor [Atorvastatin Calcium]        Muscle weakness and arm pain  . Zetia [Ezetimibe]    . Zocor [Simvastatin]              Medications Prior to Admission  Medication Sig Dispense Refill  . acetaminophen (TYLENOL) 325 MG tablet Take 1-2 tablets (325-650 mg total) by mouth every 4 (four) hours as needed for mild pain.      Marland Kitchen amLODipine (NORVASC) 2.5 MG tablet Take 1 tablet (2.5 mg total) by mouth daily. 30 tablet 0  . apixaban (ELIQUIS) 5 MG TABS tablet Take 1 tablet (5 mg total) by mouth 2 (two) times daily. 180 tablet 1  . Evolocumab (REPATHA SURECLICK) 834 MG/ML SOAJ Inject 140 mg into the skin every 14 (fourteen) days. 2 pen 11  . gabapentin (NEURONTIN) 600 MG tablet Take 600 mg by mouth at bedtime.    1  . Insulin Isophane & Regular Human (NOVOLIN  70/30 FLEXPEN) (70-30) 100 UNIT/ML PEN Inject 10-30 Units into the skin See admin instructions. Taking30 units in the Am and10 units Midday, and 20 units before dinner.      . metoprolol tartrate (LOPRESSOR) 25 MG tablet TAKE 1/2 (ONE-HALF) TABLET BY MOUTH TWICE DAILY (Patient taking differently: Take 12.5 mg by mouth 2 (two) times daily. ) 90 tablet 3  . omeprazole (PRILOSEC) 20 MG capsule Take 20 mg by mouth daily.          Drug Regimen Review  Drug regimen was reviewed and remains appropriate with no significant issues identified   Home: Home Living Family/patient expects to be discharged to:: Private residence Living Arrangements: Spouse/significant other Available Help at Discharge: Family, Available 24 hours/day Type of Home: House Home Access: Level entry, Ramped entrance Home Layout: One level Bathroom Shower/Tub: Multimedia programmer: Standard Bathroom Accessibility: Yes Home Equipment: Environmental consultant - 2 wheels, Cane - single point, Bedside commode, Shower seat, Grab bars - tub/shower, Wheelchair - manual Additional Comments: sideways with RW if needed  Lives With: Spouse   Functional History: Prior Function Level of Independence: Independent with assistive device(s) Comments: about 2 months ago was using a cane in the community only; went shopping with his wife - used grocery cart; does not drive   Functional Status:  Mobility: Bed Mobility Overal bed mobility: Needs Assistance Bed Mobility: Supine to Sit Supine to sit: Mod assist, HOB elevated General bed mobility comments: pt up in chair today Transfers Overall transfer level: Needs assistance Equipment used: Rolling walker (2 wheeled) Transfer via Lift Equipment: Stedy Transfers: Sit to/from Stand Sit to Stand: Mod assist, Max assist, +2 physical assistance Stand pivot transfers: Mod assist, +2 physical assistance General transfer comment: attempted to work on sit to stand up to RW however pt fatigued quickly  in standing. used the stedy to allow for seated breaks and more support for PT and tech. completed 3 standing trials working on maintaining midline posture instead of leaning to the L. Pt requiring mod tactile cues at L knee to achieve knee extension and prevent buckling. also worked on pulling/pushing with L UE on cross bar of stedy   ADL: ADL Overall ADL's : Needs assistance/impaired Eating/Feeding: Set up, Sitting Grooming: Moderate assistance, Sitting Upper Body Bathing: Moderate assistance, Sitting Lower Body Bathing: Maximal assistance Lower Body Bathing Details (indicate cue type and reason): Mod A +2 sit<>stand Upper Body Dressing : Moderate assistance, Sitting Lower Body Dressing: Total assistance  Lower Body Dressing Details (indicate cue type and reason): Mod A +2 sit<>stand Toilet Transfer: Moderate assistance, +2 for physical assistance, Stand-pivot Toilet Transfer Details (indicate cue type and reason): Bed>recliner going pt's right with RW Toileting- Clothing Manipulation and Hygiene: Maximal assistance Toileting - Clothing Manipulation Details (indicate cue type and reason): Mod A +2 sit<>stand   Cognition: Cognition Overall Cognitive Status: Impaired/Different from baseline Arousal/Alertness: Awake/alert Orientation Level: Oriented X4 Attention: Focused, Sustained Focused Attention: Appears intact Memory: Impaired(Immediate: 3/3; Delayed: 2/3) Memory Impairment: Retrieval deficit, Decreased recall of new information Awareness: Appears intact Problem Solving: Impaired Problem Solving Impairment: Verbal complex(4/5) Cognition Arousal/Alertness: Awake/alert Behavior During Therapy: WFL for tasks assessed/performed Overall Cognitive Status: Impaired/Different from baseline Area of Impairment: Problem solving Safety/Judgement: Decreased awareness of safety, Decreased awareness of deficits Problem Solving: Difficulty sequencing, Requires verbal cues General Comments: pt  oriented and able to follow all commands today     Blood pressure (!) 148/66, pulse (!) 59, temperature 97.9 F (36.6 C), temperature source Oral, resp. rate 18, height 6' (1.829 m), weight 93.8 kg, SpO2 95 %. Physical Exam  Constitutional: He is oriented to person, place, and time. He appears well-developed. No distress.  HENT:  Head: Normocephalic.  Eyes: Pupils are equal, round, and reactive to light. EOM are normal.  Neck: Normal range of motion. No thyromegaly present.  Cardiovascular: Normal rate and regular rhythm. Exam reveals no friction rub.  No murmur heard. Respiratory: Effort normal. No respiratory distress. He has no wheezes. He has no rales.  GI: Soft. He exhibits no distension. There is no abdominal tenderness.  Musculoskeletal: Normal range of motion.        General: No edema.  Neurological: He is alert and oriented to person, place, and time.  Good insight and awareness. Functional memory. Speech dysarthric. Left central VII and XII. LUE 2/5 delt, triceps, wrist, HI. LLE 3- HF, KE and 2- ADF/PF. RUE and RLE 4+ to 5/5. DTR's 2++ on left  Skin: Skin is warm.  Chronic changes in legs  Psychiatric: He has a normal mood and affect. His behavior is normal.      Lab Results Last 48 Hours       Results for orders placed or performed during the hospital encounter of 11/05/18 (from the past 48 hour(s))  Glucose, capillary     Status: Abnormal    Collection Time: 11/07/18 11:56 AM  Result Value Ref Range    Glucose-Capillary 275 (H) 70 - 99 mg/dL    Comment 1 Notify RN      Comment 2 Document in Chart    Glucose, capillary     Status: None    Collection Time: 11/07/18  4:33 PM  Result Value Ref Range    Glucose-Capillary 74 70 - 99 mg/dL    Comment 1 Notify RN      Comment 2 Document in Chart    Glucose, capillary     Status: Abnormal    Collection Time: 11/08/18  6:51 AM  Result Value Ref Range    Glucose-Capillary 128 (H) 70 - 99 mg/dL  Glucose, capillary      Status: Abnormal    Collection Time: 11/08/18 12:11 PM  Result Value Ref Range    Glucose-Capillary 166 (H) 70 - 99 mg/dL    Comment 1 Notify RN      Comment 2 Document in Chart    Glucose, capillary     Status: None    Collection Time: 11/08/18  4:22 PM  Result  Value Ref Range    Glucose-Capillary 77 70 - 99 mg/dL    Comment 1 Notify RN      Comment 2 Document in Chart    Glucose, capillary     Status: Abnormal    Collection Time: 11/08/18  9:15 PM  Result Value Ref Range    Glucose-Capillary 66 (L) 70 - 99 mg/dL    Comment 1 Notify RN      Comment 2 Document in Chart    Glucose, capillary     Status: Abnormal    Collection Time: 11/09/18  6:27 AM  Result Value Ref Range    Glucose-Capillary 132 (H) 70 - 99 mg/dL    Comment 1 Notify RN      Comment 2 Document in Chart        Imaging Results (Last 48 hours)  No results found.           Medical Problem List and Plan: 1.  Functional deficits and left hemiparesis secondary to right pontine infarct             -admit to inpatient rehab 2.  Antithrombotics: -DVT/anticoagulation:  Pharmaceutical: Other (comment)--Eliquis.              -antiplatelet therapy: ASA 81 mg 3. Pain Management: N/A 4. Mood: LCSW to follow for evaluation and support.              -antipsychotic agents: N/A 5. Neuropsych: This patient is capable of making decisions on his own behalf. 6. Skin/Wound Care: routine pressure relief measures.  7. Fluids/Electrolytes/Nutrition: Monitor I/O. Check lytes in am.  8. Dyslipidemia: On Repatha at home. 9.HTN: Monitor BP bid with gradual normalization over 3-5 days. On low dose Norvasc and Metoprolol bid.  10. Neurogenic bladder/Urinary retention: Will monitor voiding with PVR checks and resume I/O caths.  11.  Reactive depression: Now on Prozac.  12. T2DM: Monitor BS ac/hs. Continue home regimen of 70/30 insulin  30U/10U/20U tid ac.    Post Admission Physician Evaluation: 1. Functional deficits secondary  to  right pontine infarct. 2. Patient is admitted to receive collaborative, interdisciplinary care between the physiatrist, rehab nursing staff, and therapy team. 3. Patient's level of medical complexity and substantial therapy needs in context of that medical necessity cannot be provided at a lesser intensity of care such as a SNF. 4. Patient has experienced substantial functional loss from his/her baseline which was documented above under the "Functional History" and "Functional Status" headings.  Judging by the patient's diagnosis, physical exam, and functional history, the patient has potential for functional progress which will result in measurable gains while on inpatient rehab.  These gains will be of substantial and practical use upon discharge  in facilitating mobility and self-care at the household level. 5. Physiatrist will provide 24 hour management of medical needs as well as oversight of the therapy plan/treatment and provide guidance as appropriate regarding the interaction of the two. 6. The Preadmission Screening has been reviewed and patient status is unchanged unless otherwise stated above. 7. 24 hour rehab nursing will assist with bladder management, bowel management, safety, skin/wound care, disease management, medication administration, pain management and patient education  and help integrate therapy concepts, techniques,education, etc. 8. PT will assess and treat for/with: Lower extremity strength, range of motion, stamina, balance, functional mobility, safety, adaptive techniques and equipment, NMR, family ed, community reentry.   Goals are: supervision. 9. OT will assess and treat for/with: ADL's, functional mobility, safety, upper extremity strength, adaptive techniques  and equipment, NMR, family ed, community reentry.   Goals are: supervision. Therapy may proceed with showering this patient. 10. SLP will assess and treat for/with: speech, communication.  Goals are: supervision to mod  I. 11. Case Management and Social Worker will assess and treat for psychological issues and discharge planning. 12. Team conference will be held weekly to assess progress toward goals and to determine barriers to discharge. 13. Patient will receive at least 3 hours of therapy per day at least 5 days per week. 14. ELOS: 18-20 days       15. Prognosis:  excellent   I have personally performed a face to face diagnostic evaluation of this patient and formulated the key components of the plan.  Additionally, I have personally reviewed laboratory data, imaging studies, as well as relevant notes and concur with the physician assistant's documentation above.  Meredith Staggers, MD, [facility]     Bary Leriche, PA-C 11/09/2018

## 2018-11-09 NOTE — Significant Event (Signed)
Told Pam Love, PA that patient's blood sugar was 35 upon arrival to the unit. Pt's blood sugar is now in the 90's. Pam has changed insulin orders and would like pt to receive 15 units of 70/30 tonight.

## 2018-11-09 NOTE — H&P (Signed)
Physical Medicine and Rehabilitation Admission H&P    Chief Complaint  Patient presents with  . Functional decline.     HPI: Andre Wolfe is an 84 year old male with history of HTN, CAD, T2DM with peripheral neuropathy and gait disorder, PAF s/p DCCV, CVA 06/2018 with CIR stay and d/c to home at supervision level. He was readmitted on 11/05/18 with one day history of  Left facial droop with dysarthria and left facial weakness. He refused to come in due to COVID 19 but symptoms did not resolve therefore was brought in as code stroke. CTA head/neck was negative for LVO and showed stable widespread intracranial atherosclerosis with chronic severe L-PCA origin stenosis. Marland Kitchen MRI brain done revealing recurrent right pontine stroke and Dr. Erlinda Hong recommended adding  ASA  to Eliquis for stroke felt to be due to small vessel disease. Prozac added for reactive depression with anxiety. Patient with resultant left facial weakness with mild dysarthria and left sided weakness affecting mobility and ADLs. CIR recommended due to functional decline.    Review of Systems  Constitutional: Negative for fever.  HENT: Negative for hearing loss.   Eyes: Negative for blurred vision.  Respiratory: Negative for cough.   Cardiovascular: Negative for chest pain.  Gastrointestinal: Negative for nausea and vomiting.  Genitourinary: Positive for frequency.       Urine retention--caths 3 x day  Musculoskeletal: Negative for myalgias and neck pain.  Skin: Negative for rash.  Neurological: Positive for speech change and focal weakness. Negative for tingling, sensory change and headaches.  Psychiatric/Behavioral: Negative for depression.     Past Medical History:  Diagnosis Date  . Arthritis    "joints" (07/13/2018)  . Coronary artery disease    a. s/p CABG 12/2015.  Marland Kitchen CVA (cerebral vascular accident) ([place]) 07/13/2018   right pontine infarct; "said he's had one in the past; probably 2 yrs ago; same symptoms" (07/13/2018)   . Diabetic peripheral neuropathy ([place])   . GERD (gastroesophageal reflux disease)   . Hyperlipidemia   . Hypertension   . Postoperative atrial fibrillation ([place])   . Prostate cancer ([place])    d'x 2015/2016  . TIA (transient ischemic attack)   . Type II diabetes mellitus ([place])     Past Surgical History:  Procedure Laterality Date  . CARDIAC CATHETERIZATION  2017   "not completed; went straight to [facility]"  . CATARACT EXTRACTION W/ INTRAOCULAR LENS  IMPLANT, BILATERAL Bilateral   . CORONARY ARTERY BYPASS GRAFT  2017   "CABG X 4"  . TONSILLECTOMY      Family History  Problem Relation Age of Onset  . Heart disease Mother   . Breast cancer Mother   . Leukemia Father   . Diabetes Brother   . Breast cancer Sister   . Stroke Neg Hx     Social History:  Married. Independent with walker PTA. He  reports that he quit smoking about 4 months ago. He has a 7.80 pack-year smoking history. He has never used smokeless tobacco. He reports previous alcohol use. He reports that he does not use drugs.   Allergies  Allergen Reactions  . Crestor [Rosuvastatin Calcium]   . Lipitor [Atorvastatin Calcium]     Muscle weakness and arm pain  . Zetia [Ezetimibe]   . Zocor [Simvastatin]     Medications Prior to Admission  Medication Sig Dispense Refill  . acetaminophen (TYLENOL) 325 MG tablet Take 1-2 tablets (325-650 mg total) by mouth every 4 (four) hours as  needed for mild pain.    Marland Kitchen amLODipine (NORVASC) 2.5 MG tablet Take 1 tablet (2.5 mg total) by mouth daily. 30 tablet 0  . apixaban (ELIQUIS) 5 MG TABS tablet Take 1 tablet (5 mg total) by mouth 2 (two) times daily. 180 tablet 1  . Evolocumab (REPATHA SURECLICK) 510 MG/ML SOAJ Inject 140 mg into the skin every 14 (fourteen) days. 2 pen 11  . gabapentin (NEURONTIN) 600 MG tablet Take 600 mg by mouth at bedtime.   1  . Insulin Isophane & Regular Human (NOVOLIN 70/30 FLEXPEN) (70-30) 100 UNIT/ML PEN Inject 10-30 Units into the skin See admin  instructions. Taking30 units in the Am and10 units Midday, and 20 units before dinner.    . metoprolol tartrate (LOPRESSOR) 25 MG tablet TAKE 1/2 (ONE-HALF) TABLET BY MOUTH TWICE DAILY (Patient taking differently: Take 12.5 mg by mouth 2 (two) times daily. ) 90 tablet 3  . omeprazole (PRILOSEC) 20 MG capsule Take 20 mg by mouth daily.      Drug Regimen Review  Drug regimen was reviewed and remains appropriate with no significant issues identified  Home: Home Living Family/patient expects to be discharged to:: Private residence Living Arrangements: Spouse/significant other Available Help at Discharge: Family, Available 24 hours/day Type of Home: House Home Access: Level entry, Ramped entrance Home Layout: One level Bathroom Shower/Tub: Multimedia programmer: Standard Bathroom Accessibility: Yes Home Equipment: Environmental consultant - 2 wheels, Cane - single point, Bedside commode, Shower seat, Grab bars - tub/shower, Wheelchair - manual Additional Comments: sideways with RW if needed  Lives With: Spouse   Functional History: Prior Function Level of Independence: Independent with assistive device(s) Comments: about 2 months ago was using a cane in the community only; went shopping with his wife - used grocery cart; does not drive  Functional Status:  Mobility: Bed Mobility Overal bed mobility: Needs Assistance Bed Mobility: Supine to Sit Supine to sit: Mod assist, HOB elevated General bed mobility comments: pt up in chair today Transfers Overall transfer level: Needs assistance Equipment used: Rolling walker (2 wheeled) Transfer via Lift Equipment: Stedy Transfers: Sit to/from Stand Sit to Stand: Mod assist, Max assist, +2 physical assistance Stand pivot transfers: Mod assist, +2 physical assistance General transfer comment: attempted to work on sit to stand up to RW however pt fatigued quickly in standing. used the stedy to allow for seated breaks and more support for PT and tech.  completed 3 standing trials working on maintaining midline posture instead of leaning to the L. Pt requiring mod tactile cues at L knee to achieve knee extension and prevent buckling. also worked on pulling/pushing with L UE on cross bar of stedy      ADL: ADL Overall ADL's : Needs assistance/impaired Eating/Feeding: Set up, Sitting Grooming: Moderate assistance, Sitting Upper Body Bathing: Moderate assistance, Sitting Lower Body Bathing: Maximal assistance Lower Body Bathing Details (indicate cue type and reason): Mod A +2 sit<>stand Upper Body Dressing : Moderate assistance, Sitting Lower Body Dressing: Total assistance Lower Body Dressing Details (indicate cue type and reason): Mod A +2 sit<>stand Toilet Transfer: Moderate assistance, +2 for physical assistance, Stand-pivot Toilet Transfer Details (indicate cue type and reason): Bed>recliner going pt's right with RW Toileting- Clothing Manipulation and Hygiene: Maximal assistance Toileting - Clothing Manipulation Details (indicate cue type and reason): Mod A +2 sit<>stand  Cognition: Cognition Overall Cognitive Status: Impaired/Different from baseline Arousal/Alertness: Awake/alert Orientation Level: Oriented X4 Attention: Focused, Sustained Focused Attention: Appears intact Memory: Impaired(Immediate: 3/3; Delayed: 2/3) Memory Impairment:  Retrieval deficit, Decreased recall of new information Awareness: Appears intact Problem Solving: Impaired Problem Solving Impairment: Verbal complex(4/5) Cognition Arousal/Alertness: Awake/alert Behavior During Therapy: WFL for tasks assessed/performed Overall Cognitive Status: Impaired/Different from baseline Area of Impairment: Problem solving Safety/Judgement: Decreased awareness of safety, Decreased awareness of deficits Problem Solving: Difficulty sequencing, Requires verbal cues General Comments: pt oriented and able to follow all commands today   Blood pressure (!) 148/66, pulse  (!) 59, temperature 97.9 F (36.6 C), temperature source Oral, resp. rate 18, height 6' (1.829 m), weight 93.8 kg, SpO2 95 %. Physical Exam  Constitutional: He is oriented to person, place, and time. He appears well-developed. No distress.  HENT:  Head: Normocephalic.  Eyes: Pupils are equal, round, and reactive to light. EOM are normal.  Neck: Normal range of motion. No thyromegaly present.  Cardiovascular: Normal rate and regular rhythm. Exam reveals no friction rub.  No murmur heard. Respiratory: Effort normal. No respiratory distress. He has no wheezes. He has no rales.  GI: Soft. He exhibits no distension. There is no abdominal tenderness.  Musculoskeletal: Normal range of motion.        General: No edema.  Neurological: He is alert and oriented to person, place, and time.  Good insight and awareness. Functional memory. Speech dysarthric. Left central VII and XII. LUE 2/5 delt, triceps, wrist, HI. LLE 3- HF, KE and 2- ADF/PF. RUE and RLE 4+ to 5/5. DTR's 2++ on left  Skin: Skin is warm.  Chronic changes in legs  Psychiatric: He has a normal mood and affect. His behavior is normal.    Results for orders placed or performed during the hospital encounter of 11/05/18 (from the past 48 hour(s))  Glucose, capillary     Status: Abnormal   Collection Time: 11/07/18 11:56 AM  Result Value Ref Range   Glucose-Capillary 275 (H) 70 - 99 mg/dL   Comment 1 Notify RN    Comment 2 Document in Chart   Glucose, capillary     Status: None   Collection Time: 11/07/18  4:33 PM  Result Value Ref Range   Glucose-Capillary 74 70 - 99 mg/dL   Comment 1 Notify RN    Comment 2 Document in Chart   Glucose, capillary     Status: Abnormal   Collection Time: 11/08/18  6:51 AM  Result Value Ref Range   Glucose-Capillary 128 (H) 70 - 99 mg/dL  Glucose, capillary     Status: Abnormal   Collection Time: 11/08/18 12:11 PM  Result Value Ref Range   Glucose-Capillary 166 (H) 70 - 99 mg/dL   Comment 1 Notify  RN    Comment 2 Document in Chart   Glucose, capillary     Status: None   Collection Time: 11/08/18  4:22 PM  Result Value Ref Range   Glucose-Capillary 77 70 - 99 mg/dL   Comment 1 Notify RN    Comment 2 Document in Chart   Glucose, capillary     Status: Abnormal   Collection Time: 11/08/18  9:15 PM  Result Value Ref Range   Glucose-Capillary 66 (L) 70 - 99 mg/dL   Comment 1 Notify RN    Comment 2 Document in Chart   Glucose, capillary     Status: Abnormal   Collection Time: 11/09/18  6:27 AM  Result Value Ref Range   Glucose-Capillary 132 (H) 70 - 99 mg/dL   Comment 1 Notify RN    Comment 2 Document in Chart    No results found.  Medical Problem List and Plan: 1.  Functional deficits and left hemiparesis secondary to right pontine infarct  -admit to inpatient rehab 2.  Antithrombotics: -DVT/anticoagulation:  Pharmaceutical: Other (comment)--Eliquis.   -antiplatelet therapy: ASA 81 mg 3. Pain Management: N/A 4. Mood: LCSW to follow for evaluation and support.   -antipsychotic agents: N/A 5. Neuropsych: This patient is capable of making decisions on his own behalf. 6. Skin/Wound Care: routine pressure relief measures.  7. Fluids/Electrolytes/Nutrition: Monitor I/O. Check lytes in am.  8. Dyslipidemia: On Repatha at home. 9.HTN: Monitor BP bid with gradual normalization over 3-5 days. On low dose Norvasc and Metoprolol bid.  10. Neurogenic bladder/Urinary retention: Will monitor voiding with PVR checks and resume I/O caths.  11.  Reactive depression: Now on Prozac.  12. T2DM: Monitor BS ac/hs. Continue home regimen of 70/30 insulin  30U/10U/20U tid ac.      Bary Leriche, PA-C 11/09/2018

## 2018-11-09 NOTE — Progress Notes (Signed)
Occupational Therapy Treatment Patient Details Name: Andre Wolfe MRN: [id number] DOB: 1934-08-10 Today's Date: 11/09/2018    History of present illness Andre Wolfe is a 84 y.o. male with medical history significant of DM; prostate CA; afib on Eliquis; HTN; HLD; CVA in 12/19; and CAD s/p CABG presenting with code stroke. Pt presented with LUE and LLE weakness, MRI: acute R pontine infarct   OT comments  This 84 yo male admitted with above presents to acute OT making progress with sit<>stands, lateral steps, and working on use of LUE for grooming and transfers. He will continue to benefit from acute OT with follow up OT on CIR.   Follow Up Recommendations  CIR;Supervision/Assistance - 24 hour    Equipment Recommendations  Other (comment)(TBD next venue)       Precautions / Restrictions Precautions Precautions: Fall Restrictions Weight Bearing Restrictions: No       Mobility Bed Mobility               General bed mobility comments: up in chair upon entry   Transfers Overall transfer level: Needs assistance Equipment used: (bed rails) Transfers: Sit to/from Stand Sit to Stand: Mod assist;+2 physical assistance         General transfer comment: Worked on sit<>stand transfers X5-6 using bed rail on the bed. Required mod A +2 for lift assist and steadying assist. Manual cues to block LLE and also required verbal/manual cues for upright posture.     Balance Overall balance assessment: Needs assistance Sitting-balance support: No upper extremity supported;Feet supported Sitting balance-Leahy Scale: Fair     Standing balance support: Bilateral upper extremity supported Standing balance-Leahy Scale: Poor Standing balance comment: Reliant on BUE support and external support                            ADL either performed or assessed with clinical judgement   ADL Overall ADL's : Needs assistance/impaired     Grooming: Wash/dry face;Sitting Grooming  Details (indicate cue type and reason): in recliner, Max A to support LUE (pt can initiate bending his left elbow to bring hand towards mouth)--once hand to mouth with A pt moved face back and forth v. trying to move his arm to wash his face.                   Toilet Transfer Details (indicate cue type and reason): Mod  A+2 side step to right standing in front up from recliner at bed rail and holdign onto bed rail as he laterally stepped with VCs for what to move and A to block LLE when stepping with RLE                           Cognition Arousal/Alertness: Awake/alert Behavior During Therapy: WFL for tasks assessed/performed Overall Cognitive Status: Impaired/Different from baseline Area of Impairment: Problem solving                             Problem Solving: Difficulty sequencing;Requires verbal cues          Exercises Exercises: Other exercises Other Exercises Other Exercises: Worked on BUE push up in standing using bed rail X 5. Required cues for upright and mod A +2 for steadying.  Other Exercises: Marching activities X5, X5 bilaterally. Required seated rest. Cues for knee extension and upright posture.  Pertinent Vitals/ Pain       Pain Assessment: No/denies pain         Frequency  Min 3X/week        Progress Toward Goals  OT Goals(current goals can now be found in the care plan section)  Progress towards OT goals: Progressing toward goals  Acute Rehab OT Goals Patient Stated Goal: to go back home  Plan Discharge plan remains appropriate    Co-evaluation    PT/OT/SLP Co-Evaluation/Treatment: Yes Reason for Co-Treatment: Complexity of the patient's impairments (multi-system involvement);To address functional/ADL transfers;For patient/therapist safety PT goals addressed during session: Mobility/safety with mobility;Balance;Strengthening/ROM OT goals addressed during session: ADL's and self-care;Proper use of Adaptive  equipment and DME      AM-PAC OT "6 Clicks" Daily Activity     Outcome Measure   Help from another person eating meals?: A Little Help from another person taking care of personal grooming?: A Lot(with LUE) Help from another person toileting, which includes using toliet, bedpan, or urinal?: A Lot Help from another person bathing (including washing, rinsing, drying)?: A Lot Help from another person to put on and taking off regular upper body clothing?: A Lot Help from another person to put on and taking off regular lower body clothing?: Total 6 Click Score: 12    End of Session Equipment Utilized During Treatment: Gait belt  OT Visit Diagnosis: Unsteadiness on feet (R26.81);Other abnormalities of gait and mobility (R26.89);Muscle weakness (generalized) (M62.81);Other symptoms and signs involving cognitive function;Hemiplegia and hemiparesis Hemiplegia - Right/Left: Left Hemiplegia - dominant/non-dominant: Non-Dominant Hemiplegia - caused by: Cerebral infarction   Activity Tolerance Patient tolerated treatment well   Patient Left in chair;with call bell/phone within reach;with chair alarm set   Nurse Communication          Time: 4580-9983 OT Time Calculation (min): 24 min  Charges: OT General Charges $OT Visit: 1 Visit OT Treatments $Self Care/Home Management : 8-22 mins  [place] [address], OTR/L Acute Rehab Services Pager [phone] Office [phone]      Almon Register 11/09/2018, 4:00 PM

## 2018-11-09 NOTE — Progress Notes (Signed)
Meredith Staggers, MD  Physician  Physical Medicine and Rehabilitation  PMR Pre-admission  Signed  Date of Service:  11/09/2018 12:14 PM       Related encounter: ED to Hosp-Admission (Discharged) from 11/05/2018 in [facility]      Signed         Show:Clear all [x] Manual[x] Template[x] Copied  Added by: [x] Meredith Staggers, MD[x] Michel Santee, PT  [] Hover for details PMR Admission Coordinator Pre-Admission Assessment  Patient: Andre Wolfe is an 84 y.o., male MRN: [id number] DOB: [birth year] Height: 6' (182.9 cm) Weight: 93.8 kg  Insurance Information HMO:     PPO:      PCP:      IPA:      80/20:      OTHER:  PRIMARY: Medicare A and B      Policy#: [id number]      Subscriber: patient CM Name:       Phone#:      Fax#:  Pre-Cert#: verified via Civil engineer, contracting:  Benefits:  Phone #:      Name:  Eff. Date: 02/12/2002     Deduct: $1408      Out of Pocket Max: n/a      Life Max: n/a CIR: 100%      SNF: 100 days Outpatient: 80%     Co-Pay: 20% Home Health: 100%      Co-Pay:  DME: 80%     Co-Pay: 20% Providers: pt's choice SECONDARY:       Policy#:       Subscriber:  CM Name:       Phone#:      Fax#:  Pre-Cert#:       Employer:  Benefits:  Phone #:      Name:  Eff. Date:      Deduct:       Out of Pocket Max:       Life Max:  CIR:       SNF:  Outpatient:      Co-Pay:  Home Health:       Co-Pay:  DME:      Co-Pay:   Medicaid Application Date:       Case Manager:  Disability Application Date:       Case Worker:   The "Data Collection Information Summary" for patients in Inpatient Rehabilitation Facilities with attached "Privacy Act [place] Records" was provided and verbally reviewed with: Family  Emergency Contact Information         Contact Information    Name Relation Home Work [place], [place] Daughter   [phone]   Darcey Nora Relative   [phone]      Current Medical History   Patient Admitting Diagnosis: R paramedian CVA, L parietal CVA  History of Present Illness: Pt is an 84 year old male with history of HTN, CAD, T2DM with peripheral neuropathy and gait disorder, PAF s/p DCCV, CVA 06/2018 with CIR stay and d/c to home at supervision level on 08/02/2018. He was readmitted on 11/05/18 with one day history of Left facial droop with dysarthria and left facial weakness. He initially refused to come in due to COVID 19 but symptoms did not resolve therefore was brought in as code stroke the next day. tPA was not given due to outside window.  CT revealed evolution of R pontine infarct.  Neurology was consulted and added ASA on top of Eliquis for stroke prevention.  Therapy evaluations completed  and pt was recommended for CIR program.   Complete NIHSS TOTAL: 6  Patient's medical record from [facility]  has been reviewed by the rehabilitation admission coordinator and physician.  Past Medical History      Past Medical History:  Diagnosis Date  . Arthritis    "joints" (07/13/2018)  . Coronary artery disease    a. s/p CABG 12/2015.  Marland Kitchen CVA (cerebral vascular accident) ([place]) 07/13/2018   right pontine infarct; "said he's had one in the past; probably 2 yrs ago; same symptoms" (07/13/2018)  . Diabetic peripheral neuropathy ([place])   . GERD (gastroesophageal reflux disease)   . Hyperlipidemia   . Hypertension   . Postoperative atrial fibrillation ([place])   . Prostate cancer ([place])    d'x 2015/2016  . TIA (transient ischemic attack)   . Type II diabetes mellitus (H[facility])     Family History   family history includes Breast cancer in his mother and sister; Diabetes in his brother; Heart disease in his mother; Leukemia in his father.  Prior Rehab/Hospitalizations Has the patient had prior rehab or hospitalizations prior to admission? Yes. Admitted to CIR from 07/16/2018 through 08/02/2018 for R pontine CVA  Has the patient had major surgery during 100  days prior to admission? No              Current Medications  Current Facility-Administered Medications:  .  acetaminophen (TYLENOL) tablet 650 mg, 650 mg, Oral, Q4H PRN **OR** acetaminophen (TYLENOL) solution 650 mg, 650 mg, Per Tube, Q4H PRN **OR** acetaminophen (TYLENOL) suppository 650 mg, 650 mg, Rectal, Q4H PRN, Karmen Bongo, MD .  amLODipine (NORVASC) tablet 2.5 mg, 2.5 mg, Oral, Daily, Little Ishikawa, MD, 2.5 mg at 11/09/18 0924 .  apixaban (ELIQUIS) tablet 5 mg, 5 mg, Oral, BID, Karmen Bongo, MD, 5 mg at 11/09/18 0925 .  aspirin EC tablet 81 mg, 81 mg, Oral, Daily, Rosalin Hawking, MD, 81 mg at 11/09/18 0925 .  feeding supplement (ENSURE ENLIVE) (ENSURE ENLIVE) liquid 237 mL, 237 mL, Oral, BID BM, Karmen Bongo, MD, 237 mL at 11/09/18 0925 .  FLUoxetine (PROZAC) capsule 20 mg, 20 mg, Oral, Daily, Metzger-Cihelka, Desiree, NP, 20 mg at 11/09/18 0924 .  gabapentin (NEURONTIN) tablet 600 mg, 600 mg, Oral, QHS, Karmen Bongo, MD, 600 mg at 11/08/18 2138 .  insulin aspart (novoLOG) injection 0-15 Units, 0-15 Units, Subcutaneous, TID WC, Karmen Bongo, MD, 2 Units at 11/09/18 [phone] .  insulin aspart (novoLOG) injection 0-5 Units, 0-5 Units, Subcutaneous, QHS, Karmen Bongo, MD, 2 Units at 11/07/18 2157 .  insulin aspart protamine- aspart (NOVOLOG MIX 70/30) injection 10 Units, 10 Units, Subcutaneous, QAC lunch, Karmen Bongo, MD, 10 Units at 11/08/18 1225 .  insulin aspart protamine- aspart (NOVOLOG MIX 70/30) injection 20 Units, 20 Units, Subcutaneous, Q supper, Karmen Bongo, MD, 20 Units at 11/08/18 1756 .  insulin aspart protamine- aspart (NOVOLOG MIX 70/30) injection 30 Units, 30 Units, Subcutaneous, Q breakfast, Karmen Bongo, MD, 30 Units at 11/09/18 [phone] .  metoprolol tartrate (LOPRESSOR) tablet 12.5 mg, 12.5 mg, Oral, BID, Rosalin Hawking, MD, 12.5 mg at 11/09/18 0925 .  pantoprazole (PROTONIX) EC tablet 40 mg, 40 mg, Oral, Daily, Karmen Bongo, MD, 40 mg at  11/09/18 0925 .  senna-docusate (Senokot-S) tablet 1 tablet, 1 tablet, Oral, QHS PRN, Karmen Bongo, MD  Patients Current Diet:     Diet Order                  Diet - low sodium  heart healthy         Diet heart healthy/carb modified Room service appropriate? Yes with Assist; Fluid consistency: Thin  Diet effective now               Precautions / Restrictions Precautions Precautions: Fall Restrictions Weight Bearing Restrictions: No   Has the patient had 2 or more falls or a fall with injury in the past year? Yes, 1 fall prior to admission to hospital in Dec of 2019 and 1 fall while on rehab in Jan of 2020.   Prior Activity Level Limited Community (1-2x/wk): prior to admission to [facility] in [facility] 2020, went out with wife shopping, used AD for balance/endurance.  Since CIR has been more home bound  Prior Functional Level Self Care: Did the patient need help bathing, dressing, using the toilet or eating? Independent  Indoor Mobility: Did the patient need assistance with walking from room to room (with or without device)? Needed some help (supervision)  Stairs: Did the patient need assistance with internal or external stairs (with or without device)? Needed some help  (supervision)  Functional Cognition: Did the patient need help planning regular tasks such as shopping or remembering to take medications? Needed some help  Home Assistive Devices / [place] Devices/Equipment: Kasandra Knudsen (specify quad or straight), Walker (specify type) Home Equipment: Environmental consultant - 2 wheels, Cane - single point, Bedside commode, Shower seat, Grab bars - tub/shower, Wheelchair - manual  Prior Device Use: Indicate devices/aids used by the patient prior to current illness, exacerbation or injury? Walker  Current Functional Level Cognition  Arousal/Alertness: Awake/alert Overall Cognitive Status: Impaired/Different from baseline Orientation Level: Oriented X4  Safety/Judgement: Decreased awareness of safety, Decreased awareness of deficits General Comments: pt oriented and able to follow all commands today Attention: Focused, Sustained Focused Attention: Appears intact Memory: Impaired(Immediate: 3/3; Delayed: 2/3) Memory Impairment: Retrieval deficit, Decreased recall of new information Awareness: Appears intact Problem Solving: Impaired Problem Solving Impairment: Verbal complex(4/5)    Extremity Assessment (includes Sensation/Coordination)  Upper Extremity Assessment: Defer to OT evaluation LUE Deficits / Details: [place] 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  LLE Sensation: WNL LLE Coordination: decreased gross motor, decreased fine motor    ADLs  Overall ADL's : Needs assistance/impaired Eating/Feeding: Set up, Sitting Grooming: Moderate assistance, Sitting Upper Body Bathing: Moderate assistance, Sitting Lower Body Bathing: Maximal assistance Lower Body Bathing Details (indicate cue type and reason): Mod A +2 sit<>stand Upper Body Dressing : Moderate assistance, Sitting Lower Body Dressing: Total assistance Lower Body Dressing Details (indicate cue type and reason): Mod A +2 sit<>stand Toilet Transfer: Moderate assistance, +2 for physical assistance, Stand-pivot Toilet Transfer Details (indicate cue type and reason): Bed>recliner going pt's right with RW Toileting- Clothing Manipulation and Hygiene: Maximal assistance Toileting - Clothing Manipulation Details (indicate cue type and reason): Mod A +2 sit<>stand    Mobility  Overal bed mobility: Needs Assistance Bed Mobility: Supine to Sit Supine to sit: Mod assist, HOB elevated General bed mobility comments: pt up in chair today    Transfers  Overall transfer level: Needs assistance Equipment used: Rolling walker (2  wheeled) Transfer via Lift Equipment: Stedy Transfers: Sit to/from Stand Sit to Stand: Mod assist, Max assist, +2 physical assistance Stand pivot transfers: Mod assist, +2 physical assistance General transfer comment: attempted to work on sit to stand up to RW however pt fatigued quickly in standing.  used the stedy to allow for seated breaks and more support for PT and tech. completed 3 standing trials working on maintaining midline posture instead of leaning to the L. Pt requiring mod tactile cues at L knee to achieve knee extension and prevent buckling. also worked on pulling/pushing with L UE on cross bar of stedy    Ambulation / Gait / Stairs / Office manager / Balance Balance Overall balance assessment: Needs assistance Sitting-balance support: No upper extremity supported, Feet supported Sitting balance-Leahy Scale: Fair Standing balance support: Bilateral upper extremity supported Standing balance-Leahy Scale: Poor Standing balance comment: Reliant on BUE support     Special needs/care consideration BiPAP/CPAP no CPM no Continuous Drip IV no Dialysis no        Days n/a Life Vest no Oxygen no Special Bed no Trach Size no Wound Vac (area) no      Location n/a Skin intact                              Location n/a Bowel mgmt: [facility], last BM 11/07/2018 Bladder mgmt: [facility] Diabetic mgmt: yes Behavioral consideration no Chemo/radiation no   Previous Home Environment (from acute therapy documentation) Living Arrangements: Spouse/significant other  Lives With: Spouse Available Help at Discharge: Family, Available 24 hours/day Type of Home: House Home Layout: One level Home Access: Level entry, Ramped entrance Bathroom Shower/Tub: Multimedia programmer: Standard Bathroom Accessibility: Yes Home Care Services: No Additional Comments: sideways with RW if needed  Discharge Living Setting Plans for Discharge Living Setting: Patient's  home Type of Home at Discharge: House Discharge Home Layout: One level Discharge Home Access: Ramped entrance Discharge Bathroom Shower/Tub: Walk-in shower(with lip) Discharge Bathroom Toilet: Standard Discharge Bathroom Accessibility: Yes How Accessible: Accessible via walker(side stepping with walker) Does the patient have any problems obtaining your medications?: No  Social/Family/Support Systems Patient Roles: Spouse, Parent Anticipated Caregiver: wife Silva Bandy) and daughter [name]) Anticipated Caregiver's Contact Information: [name] [phone] Ability/Limitations of Caregiver: pt's wife to provide 24/7, [name] lives close by if needed Caregiver Availability: 24/7 Discharge Plan Discussed with Primary Caregiver: Yes Is Caregiver In Agreement with Plan?: Yes Does Caregiver/Family have Issues with Lodging/Transportation while Pt is in Rehab?: No  Goals/Additional Needs Patient/Family Goal for Rehab: PT/OT/ST, supervision Expected length of stay: 18-20 days Dietary Needs: heart health/carb modified, thin liquids Equipment Needs: tbd Special Service Needs: n/a Additional Information: n/a Pt/Family Agrees to Admission and willing to participate: Yes Program Orientation Provided & Reviewed with Pt/Caregiver Including Roles  & Responsibilities: Yes Additional Information Needs: n/a Information Needs to be Provided By: n/a   Possible need for SNF placement upon discharge: not anticipated  Patient Condition: I have reviewed medical records from [facility], spoken with CM, and patient and daughter. I met with patient at the bedside and discussed with daughter via phone for inpatient rehabilitation assessment.  Patient will benefit from ongoing PT, OT and SLP, can actively participate in 3 hours of therapy a day 5 days of the week, and can make measurable gains during the admission.  Patient will also benefit from the coordinated team approach during an Inpatient Acute  Rehabilitation admission.  The patient will receive intensive therapy as well as Rehabilitation physician, nursing, social worker, and care management interventions.  Due to safety, skin/wound care, disease management, medication administration, pain management and patient education the patient requires 24 hour a day rehabilitation nursing.  The patient is currently mod to max +2 with mobility and basic ADLs.  Discharge setting and therapy post discharge at home with home health is anticipated.  Patient has agreed to participate in the Acute Inpatient Rehabilitation Program and will admit today.  Preadmission Screen Completed By:  Michel Santee PT, DPT 11/09/2018 12:14 PM ______________________________________________________________________   Discussed status with Dr. Naaman Plummer on 11/09/18 at 12:24 PM and received approval for admission today.  Admission Coordinator:  Michel Santee, PT, DPT time 12:24 PM Sudie Grumbling 11/09/18    Assessment/Plan: Diagnosis: right pontine infarct  1. Does the need for close, 24 hr/day Medical supervision in concert with the patient's rehab needs make it unreasonable for this patient to be served in a less intensive setting? Yes 2. Co-Morbidities requiring supervision/potential complications: HTN, CAD, DM 3. Due to bladder management, bowel management, safety, skin/wound care, disease management, medication administration, pain management and patient education, does the patient require 24 hr/day rehab nursing? Yes 4. Does the patient require coordinated care of a physician, rehab nurse, PT (1-2 hrs/day, 5 days/week), OT (1-2 hrs/day, 5 days/week) and SLP (1-2 hrs/day, 5 days/week) to address physical and functional deficits in the context of the above medical diagnosis(es)? Yes Addressing deficits in the following areas: balance, endurance, locomotion, strength, transferring, bowel/bladder control, bathing, dressing, feeding, grooming, toileting, speech, swallowing and  psychosocial support 5. Can the patient actively participate in an intensive therapy program of at least 3 hrs of therapy 5 days a week? Yes 6. The potential for patient to make measurable gains while on inpatient rehab is excellent 7. Anticipated functional outcomes upon discharge from inpatients are: supervision PT, supervision OT, supervision SLP 8. Estimated rehab length of stay to reach the above functional goals is: 17-20 days 9. Anticipated D/C setting: Home 10. Anticipated post D/C treatments: [place] therapy 11. Overall Rehab/Functional Prognosis: excellent  MD Signature: Meredith Staggers, MD, [facility] 11/09/2018         Revision History

## 2018-11-09 NOTE — Discharge Summary (Signed)
Physician Discharge Summary  Andre Wolfe GEZ:[id number] DOB: 1935/06/28 DOA: 11/05/2018  PCP: [name], MD  Admit date: 11/05/2018 Discharge date: 11/09/2018  Disposition: Inpatient rehab  Recommendations for Outpatient Follow-up:  1. Follow up with PCP in 1-2 weeks after discharge from rehab 2. Please obtain BMP/CBC in one week   Discharge Condition: Stable  CODE STATUS: DNR Diet recommendation: Heart healthy, diabetic Brief/Interim Summary: 84 year old male with a history of paroxysmal atrial fibrillation, hypertension, hyperlipidemia, coronary disease, stroke, prostate cancer presenting with onset of left hemiparesis of the left upper extremity and left lower extremity around 6 PM on 11/05/2018.  In addition, the patient also had a sensation of difficulty swallowing around that time as well as some slurred speech.  As result, EMS was activated.  His deficits remained stable in the emergency department.  Neurology was consulted.  CT the brain showed expected evolution of the right pontine infarct.  CT angiogram of the head and neck was negative for LVO.  The patient was admitted for further stroke work-up.  The patient endorses compliance with all his medications. Notably, the patient was recent admitted to the hospital from 07/13/2018 through 07/16/2018 during which she was treated for an acute right pontine infarct.  It was felt to be due to small vessel disease.  He was noted to have paroxysmal atrial fibrillation with RVR at that time.  He was started on apixaban.  He was subsequently discharged to CIR where he stayed until 08/02/2018.  At baseline, the patient uses a cane and occasionally a walker when he is out of the house.  In the hospital patient was noted to have acute ischemic stroke in the right pontine area, similar to his previous location for previous CVA.  Weakness in his left upper and lower extremities patient was evaluated by neurology, pending additional 81 mg aspirin on top  of anticoagulation as he is currently on Eliquis the setting of paroxysmal A. Fib.  This time patient is otherwise stable and agreeable for transition and discharge to inpatient rehab for further evaluation and treatment.  Discharge Diagnoses:  Principal Problem:   Acute ischemic stroke [facility]) Active Problems:   Hypercholesterolemia   Essential hypertension   Poorly controlled type 2 diabetes mellitus with peripheral neuropathy (HCC)   PAF (paroxysmal atrial fibrillation) (HCC)  Acute ischemic stroke, right pontine POA, minimally improving -Neurology signing off, patient stable for discharge to inpatient rehab per their recommendations -PT/OT evaluation ongoing, pending inpatient rehab evaluation and placement -Patient continues to have left upper extremity weakness although denies any paresthesias or decreased sensation -Speech therapy following, appreciate insight and recommendations -CT brain--expected evolution of the right pontine infarct -MRI brain--recurrent acute right pontine infarct -CTA H&N--no LVO; widespread intracranial ASVD stable since MRA 06/2018 -Echo--07/14/18--no need for repeat imaging -LDL--40 -HbA1C--8.0 -Antiplatelet--ASA 81+apixaban per neurology recommendations  Insulin dependent diabetes mellitus type 2, poorly controlled -Continue 70/30 insulin -NovoLog sliding scale -11/05/2018 hemoglobin A1c 8.0  Hyperlipidemia -Patient receives Repatha q. 14 days -LDL 40 -Patient carries an allergy to statin in the form of myalgias  Essential hypertension -Previously allowing for permissive hypertension -Resume home medications as indicated with amlodipine and metoprolol tartrate -Hydralazine prn SBP >220  Paroxysmal atrial fibrillation, without RVR -Transitioned into A. fib today although rate controlled, restart metoprolol as above -Continue apixaban -CHADSVASc = 7  Disposition Plan:   CIR Family Communication: Daughter updated on phone Consultants:   Neurology Code Status: DNR DVT Prophylaxis: Apixaban   Allergies as of 11/09/2018  Reactions   Crestor [rosuvastatin Calcium]    Lipitor [atorvastatin Calcium]    Muscle weakness and arm pain   Zetia [ezetimibe]    Zocor [simvastatin]       Medication List    TAKE these medications   acetaminophen 325 MG tablet Commonly known as:  TYLENOL Take 1-2 tablets (325-650 mg total) by mouth every 4 (four) hours as needed for mild pain.   amLODipine 2.5 MG tablet Commonly known as:  NORVASC Take 1 tablet (2.5 mg total) by mouth daily.   apixaban 5 MG Tabs tablet Commonly known as:  ELIQUIS Take 1 tablet (5 mg total) by mouth 2 (two) times daily.   aspirin 81 MG EC tablet Take 1 tablet (81 mg total) by mouth daily. Start taking on:  November 10, 2018   Evolocumab 140 MG/ML Soaj Commonly known as:  Photographer Inject 419 mg into the skin every 14 (fourteen) days.   feeding supplement (ENSURE ENLIVE) Liqd Take 237 mLs by mouth 2 (two) times daily between meals.   FLUoxetine 20 MG capsule Commonly known as:  PROZAC Take 1 capsule (20 mg total) by mouth daily. Start taking on:  November 10, 2018   gabapentin 600 MG tablet Commonly known as:  NEURONTIN Take 600 mg by mouth at bedtime.   metoprolol tartrate 25 MG tablet Commonly known as:  LOPRESSOR Take 0.5 tablets (12.5 mg total) by mouth 2 (two) times daily. What changed:  See the new instructions.   NovoLIN 70/30 FlexPen (70-30) 100 UNIT/ML PEN Generic drug:  Insulin Isophane & Regular Human Inject 10-30 Units into the skin See admin instructions. Taking30 units in the Am and10 units Midday, and 20 units before dinner.   omeprazole 20 MG capsule Commonly known as:  PRILOSEC Take 20 mg by mouth daily.      Follow-up Information    Venancio Poisson, NP. Go on 12/14/2018.   Specialty:  Neurology Contact information: [address] [place] [address] [phone]          Allergies  Allergen  Reactions  . Crestor [Rosuvastatin Calcium]   . Lipitor [Atorvastatin Calcium]     Muscle weakness and arm pain  . Zetia [Ezetimibe]   . Zocor [Simvastatin]     Consultations:  Neurology   Procedures/Studies: Ct Angio Head W Or Wo Contrast  Result Date: 11/05/2018 CLINICAL DATA:  Code stroke.  84 year old male. EXAM: CT ANGIOGRAPHY HEAD AND NECK TECHNIQUE: Multidetector CT imaging of the head and neck was performed using the standard protocol during bolus administration of intravenous contrast. Multiplanar CT image reconstructions and MIPs were obtained to evaluate the vascular anatomy. Carotid stenosis measurements (when applicable) are obtained utilizing NASCET criteria, using the distal internal carotid diameter as the denominator. CONTRAST:  42mL OMNIPAQUE IOHEXOL 350 MG/ML SOLN COMPARISON:  Head CT without contrast 0824 hours today. MRI and intracranial MRA 07/13/2018. FINDINGS: CTA NECK Skeleton: Prior sternotomy. No acute osseous abnormality identified. Upper chest: Centrilobular emphysema. No superior mediastinal lymphadenopathy. Other neck: Mild thyroid goiter on the right. Small 12 millimeter left parotid gland nodule is stable since December (series 9, image 163) and head hypercellular features on MRI. Other neck soft tissues are within normal limits. Aortic arch: Calcified aortic atherosclerosis. 3 vessel arch configuration. Right carotid system: No brachiocephalic or right CCA origin stenosis despite plaque. Soft and calcified plaque at the right ICA origin and bulb with less than 50 % stenosis with respect to the distal vessel. Left carotid system: No left CCA  origin stenosis. Mildly tortuous left CCA. Soft and calcified plaque at the left ICA origin and bulb with less than 50 % stenosis with respect to the distal vessel. Vertebral arteries: Tortuous proximal right subclavian artery with soft and calcified plaque but no stenosis. Normal right vertebral artery origin. Patent right  vertebral artery to the skull base without stenosis. Mildly tortuous proximal left subclavian artery with soft and calcified plaque but no stenosis. Patent left vertebral artery origin without stenosis. Mild left V1 calcified plaque and tortuosity. Left V2 segment irregularity intermittently throughout the neck suggesting multifocal soft plaque, but no hemodynamically significant stenosis identified to the skull base. CTA HEAD Posterior circulation: Codominant distal vertebral arteries without stenosis. Patent vertebrobasilar junction and PICA origins. Patent basilar artery without stenosis. Normal SCA and right PCA origins. Moderate to severe irregularity and stenosis redemonstrated at the left PCA origin and stable from the prior MRA. The left P2 segment is mildly irregular but the other left PCA branches are within normal limits. There is contralateral moderate to severe right P2 segment stenosis which appears progressed since December, but with preserved distal right PCA enhancement. Anterior circulation: Both ICA siphons are patent. Extensive left siphon plaque including bulky calcified plaque in the cavernous and proximal supraclinoid segments and soft plaque in the petrous segment. Mild to moderate subsequent left ICA stenosis appears stable since December. Normal left ophthalmic artery origin. Posterior communicating arteries are diminutive or absent. Right ICA siphon similar soft and calcified plaque with mild to moderate stenosis stable since December. Normal left ophthalmic artery origin. Patent carotid termini. Dominant left and diminutive or absent right ACA A1 segments. Normal left A1, anterior communicating artery and bilateral ACA branches. Left MCA M1 segment and trifurcation are patent with mild irregularity that appears stable since December. Left MCA branches are stable. Right MCA M1 and bifurcation are patent and appear stable since December. Right MCA branches appear stable. Venous sinuses: Not  well evaluated due to early contrast timing Anatomic variants: Dominant left ACA A1. Bovine arch configuration. Review of the MIP images confirms the above findings IMPRESSION: 1. Negative for emergent large vessel occlusion. 2. Widespread intracranial atherosclerosis which appears largely stable since the December MRA. - possible increased moderate to severe Right PCA P2 stenosis. - chronic severe Left PCA origin stenosis. - up to moderate bilateral ICA siphon stenosis. 3. Atherosclerosis in the neck and Aortic Atherosclerosis (ICD10-I70.0). No significant cervical carotid or vertebral artery stenosis identified. 4. 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 Recommend outpatient ENT follow-up. Salient findings communicated to Dr. Rory Percy at [phone] hours on 11/05/2018 by text page via the [facility] messaging system. Electronically Signed   By: Genevie Ann M.D.   On: 11/05/2018 08:50   Ct Angio Neck W Or Wo Contrast  Result Date: 11/05/2018 CLINICAL DATA:  Code stroke.  84 year old male. EXAM: CT ANGIOGRAPHY HEAD AND NECK TECHNIQUE: Multidetector CT imaging of the head and neck was performed using the standard protocol during bolus administration of intravenous contrast. Multiplanar CT image reconstructions and MIPs were obtained to evaluate the vascular anatomy. Carotid stenosis measurements (when applicable) are obtained utilizing NASCET criteria, using the distal internal carotid diameter as the denominator. CONTRAST:  32mL OMNIPAQUE IOHEXOL 350 MG/ML SOLN COMPARISON:  Head CT without contrast 0824 hours today. MRI and intracranial MRA 07/13/2018. FINDINGS: CTA NECK Skeleton: Prior sternotomy. No acute osseous abnormality identified. Upper chest: Centrilobular emphysema. No superior mediastinal lymphadenopathy. Other neck: Mild thyroid goiter  on the right. Small 12 millimeter left parotid gland nodule is stable since December (series 9, image 163) and head hypercellular  features on MRI. Other neck soft tissues are within normal limits. Aortic arch: Calcified aortic atherosclerosis. 3 vessel arch configuration. Right carotid system: No brachiocephalic or right CCA origin stenosis despite plaque. Soft and calcified plaque at the right ICA origin and bulb with less than 50 % stenosis with respect to the distal vessel. Left carotid system: No left CCA origin stenosis. Mildly tortuous left CCA. Soft and calcified plaque at the left ICA origin and bulb with less than 50 % stenosis with respect to the distal vessel. Vertebral arteries: Tortuous proximal right subclavian artery with soft and calcified plaque but no stenosis. Normal right vertebral artery origin. Patent right vertebral artery to the skull base without stenosis. Mildly tortuous proximal left subclavian artery with soft and calcified plaque but no stenosis. Patent left vertebral artery origin without stenosis. Mild left V1 calcified plaque and tortuosity. Left V2 segment irregularity intermittently throughout the neck suggesting multifocal soft plaque, but no hemodynamically significant stenosis identified to the skull base. CTA HEAD Posterior circulation: Codominant distal vertebral arteries without stenosis. Patent vertebrobasilar junction and PICA origins. Patent basilar artery without stenosis. Normal SCA and right PCA origins. Moderate to severe irregularity and stenosis redemonstrated at the left PCA origin and stable from the prior MRA. The left P2 segment is mildly irregular but the other left PCA branches are within normal limits. There is contralateral moderate to severe right P2 segment stenosis which appears progressed since December, but with preserved distal right PCA enhancement. Anterior circulation: Both ICA siphons are patent. Extensive left siphon plaque including bulky calcified plaque in the cavernous and proximal supraclinoid segments and soft plaque in the petrous segment. Mild to moderate subsequent  left ICA stenosis appears stable since December. Normal left ophthalmic artery origin. Posterior communicating arteries are diminutive or absent. Right ICA siphon similar soft and calcified plaque with mild to moderate stenosis stable since December. Normal left ophthalmic artery origin. Patent carotid termini. Dominant left and diminutive or absent right ACA A1 segments. Normal left A1, anterior communicating artery and bilateral ACA branches. Left MCA M1 segment and trifurcation are patent with mild irregularity that appears stable since December. Left MCA branches are stable. Right MCA M1 and bifurcation are patent and appear stable since December. Right MCA branches appear stable. Venous sinuses: Not well evaluated due to early contrast timing Anatomic variants: Dominant left ACA A1. Bovine arch configuration. Review of the MIP images confirms the above findings IMPRESSION: 1. Negative for emergent large vessel occlusion. 2. Widespread intracranial atherosclerosis which appears largely stable since the December MRA. - possible increased moderate to severe Right PCA P2 stenosis. - chronic severe Left PCA origin stenosis. - up to moderate bilateral ICA siphon stenosis. 3. Atherosclerosis in the neck and Aortic Atherosclerosis (ICD10-I70.0). No significant cervical carotid or vertebral artery stenosis identified. 4. 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 Recommend outpatient ENT follow-up. Salient findings communicated to Dr. Rory Percy at [phone] hours on 11/05/2018 by text page via the [facility] messaging system. Electronically Signed   By: Genevie Ann M.D.   On: 11/05/2018 08:50   Mr Brain Wo Contrast (neuro Protocol)  Result Date: 11/05/2018 CLINICAL DATA:  Left-sided weakness and dysarthria.  Prior stroke. EXAM: MRI HEAD WITHOUT CONTRAST TECHNIQUE: Multiplanar, multiecho pulse sequences of the brain and surrounding structures were obtained without intravenous contrast.  COMPARISON:  Head CT and CTA 11/05/2018 and MRI 07/13/2018 FINDINGS: Brain: There is a recurrent acute right pontine infarct, smaller than that on the prior MRI. The acute ischemia is located along the margins of the old infarct and predominantly posterior to it. Diffusion abnormality associated with the punctate left parietal infarct on the prior MRI has resolved. Patchy T2 hyperintensities in the cerebral white matter are unchanged and nonspecific but compatible with chronic small vessel ischemic disease, mildly advanced for age. Mild cerebral atrophy for age is also unchanged. No intracranial hemorrhage, mass, midline shift, or extra-axial fluid collection is identified. Vascular: Major intracranial vascular flow voids are preserved. Skull and upper cervical spine: Unremarkable bone marrow signal. Sinuses/Orbits: Bilateral cataract extraction. Mild left maxillary sinus mucosal thickening. Clear mastoid air cells. Other: None. IMPRESSION: 1. Recurrent acute right pontine infarct. 2. Mild chronic small vessel ischemic disease in the cerebral white matter. Electronically Signed   By: Logan Bores M.D.   On: 11/05/2018 11:50   Ct Head Code Stroke Wo Contrast  Result Date: 11/05/2018 CLINICAL DATA:  Code stroke. 84 year old male. History of small vessel infarcts in 2019. EXAM: CT HEAD WITHOUT CONTRAST TECHNIQUE: Contiguous axial images were obtained from the base of the skull through the vertex without intravenous contrast. COMPARISON:  Brain MRI and head CT 07/13/2018 FINDINGS: Brain: Stable cerebral volume. No midline shift, mass effect, or evidence of intracranial mass lesion. No acute intracranial hemorrhage identified. No ventriculomegaly. Evolved right pontine infarct seen in December. Patchy bilateral cerebral white matter hypodensity. Asymmetric left occipital lobe sulcus or less likely chronic encephalomalacia is stable. Stable gray-white matter differentiation throughout the brain. No cortically based  acute infarct identified. Vascular: Calcified atherosclerosis at the skull base. No suspicious intracranial vascular hyperdensity. Skull: No acute osseous abnormality identified. Sinuses/Orbits: Visualized paranasal sinuses and mastoids are stable and well pneumatized. Other: No acute orbit or scalp soft tissue finding. ASPECTS [facility] Stroke Program Early CT Score) - Ganglionic level infarction (caudate, lentiform nuclei, internal capsule, insula, M1-M3 cortex): 7 - Supraganglionic infarction (M4-M6 cortex): 3 Total score (0-10 with 10 being normal): 10 IMPRESSION: 1. No acute cortically based infarct or acute intracranial hemorrhage identified. ASPECTS is 10. 2. Expected evolution of right pontine infarct since December. Chronic white matter disease. 3. These results were communicated to Dr. Rory Percy at 8:32 am on 4/23/2020by text page via the [facility] messaging system. Electronically Signed   By: Genevie Ann M.D.   On: 11/05/2018 08:33     Discharge Exam: Vitals:   11/09/18 0425 11/09/18 0803  BP: (!) 177/68 (!) 148/66  Pulse: (!) 58 (!) 59  Resp: 17 18  Temp: (!) 97.5 F (36.4 C) 97.9 F (36.6 C)  SpO2: 96% 95%   Vitals:   11/08/18 1959 11/08/18 2351 11/09/18 0425 11/09/18 0803  BP: 135/66 (!) 142/57 (!) 177/68 (!) 148/66  Pulse: 64 (!) 59 (!) 58 (!) 59  Resp: 17 16 17 18   Temp:  97.7 F (36.5 C) (!) 97.5 F (36.4 C) 97.9 F (36.6 C)  TempSrc:  Oral Oral Oral  SpO2: 96% 95% 96% 95%  Weight:      Height:       Physical exam  General:  Pleasantly resting in bed, No acute distress. HEENT:  Normocephalic atraumatic.  Sclerae nonicteric, noninjected.  Extraocular movements intact bilaterally. Neck:  Without mass or deformity.  Trachea is midline. Lungs:  Clear to auscultate bilaterally without rhonchi, wheeze, or rales. Heart:  Regular rate and rhythm.  Without murmurs,  rubs, or gallops. Abdomen:  Soft, nontender, nondistended.  Without guarding or rebound. Extremities: Without cyanosis,  clubbing, edema, or obvious deformity.  Left upper extremity strength 1 out of 5, left lower extremity strength 4 out of 5; right upper and right lower extremity 5 out of 5; patient intact globally Vascular:  Dorsalis pedis and posterior tibial pulses palpable bilaterally. Skin:  Warm and dry, no erythema, no ulcerations.   The results of significant diagnostics from this hospitalization (including imaging, microbiology, ancillary and laboratory) are listed below for reference.     Microbiology: No results found for this or any previous visit (from the past 240 hour(s)).   Labs: BNP (last 3 results) No results for input(s): BNP in the last 8760 hours. Basic Metabolic Panel: Recent Labs  Lab 11/05/18 0819 11/05/18 0831 11/06/18 1039  NA 137  --  136  K 4.8  --  3.9  CL 103  --  101  CO2 24  --  24  GLUCOSE 188*  --  278*  BUN 15  --  16  CREATININE 1.08 1.00 1.11  CALCIUM 9.3  --  9.9   Liver Function Tests: Recent Labs  Lab 11/05/18 0819  AST 25  ALT 25  ALKPHOS 52  BILITOT 1.1  PROT 6.9  ALBUMIN 3.7   No results for input(s): LIPASE, AMYLASE in the last 168 hours. No results for input(s): AMMONIA in the last 168 hours. CBC: Recent Labs  Lab 11/05/18 0819  WBC 7.4  NEUTROABS 4.9  HGB 15.9  HCT 46.4  MCV 86.2  PLT 211   Cardiac Enzymes: No results for input(s): CKTOTAL, CKMB, CKMBINDEX, TROPONINI in the last 168 hours. BNP: Invalid input(s): POCBNP CBG: Recent Labs  Lab 11/08/18 0651 11/08/18 1211 11/08/18 1622 11/08/18 2115 11/09/18 0627  GLUCAP 128* 166* 77 66* 132*   D-Dimer No results for input(s): DDIMER in the last 72 hours. Hgb A1c No results for input(s): HGBA1C in the last 72 hours. Lipid Profile No results for input(s): CHOL, HDL, LDLCALC, TRIG, CHOLHDL, LDLDIRECT in the last 72 hours. Thyroid function studies No results for input(s): TSH, T4TOTAL, T3FREE, THYROIDAB in the last 72 hours.  Invalid input(s): FREET3 Anemia work up No  results for input(s): VITAMINB12, FOLATE, FERRITIN, TIBC, IRON, RETICCTPCT in the last 72 hours. Urinalysis    Component Value Date/Time   COLORURINE YELLOW 07/13/2018 [place] 07/13/2018 1321   LABSPEC 1.028 07/13/2018 1321   PHURINE 5.0 07/13/2018 1321   GLUCOSEU >=500 (A) 07/13/2018 1321   HGBUR NEGATIVE 07/13/2018 1321   [place] 07/13/2018 1321   KETONESUR NEGATIVE 07/13/2018 1321   PROTEINUR 30 (A) 07/13/2018 1321   NITRITE NEGATIVE 07/13/2018 1321   LEUKOCYTESUR NEGATIVE 07/13/2018 1321   Sepsis Labs Invalid input(s): PROCALCITONIN,  WBC,  LACTICIDVEN Microbiology No results found for this or any previous visit (from the past 240 hour(s)).   Time coordinating discharge: Over 30 minutes  SIGNED:  Little Ishikawa, DO [facility] 11/09/2018, 11:04 AM Pager   If 7PM-7AM, please contact night-coverage [email or web address] Password [email or web address]

## 2018-11-09 NOTE — Progress Notes (Addendum)
Physical Therapy Treatment Patient Details Name: Andre Wolfe MRN: [id number] DOB: 05/31/1935 Today's Date: 11/09/2018    History of Present Illness Andre Wolfe is a 84 y.o. male with medical history significant of DM; prostate CA; afib on Eliquis; HTN; HLD; CVA in 12/19; and CAD s/p CABG presenting with code stroke. Pt presented with LUE and LLE weakness, MRI: acute R pontine infarct    PT Comments    Pt progressing well towards goals. Was able to take side steps using bed rail at EOB, however, required manual blocking of LLE and max A +2. Worked on other pregait activities and performing sit<>Stands this session as well. Pt requiring mod A +2 for those activities. Pt remains motivated to regain independence and feel he is excellent candidate for CIR. Will continue to follow acutely to maximize functional mobility independence and safety.    Follow Up Recommendations  CIR     Equipment Recommendations  None recommended by PT    Recommendations for Other Services Rehab consult     Precautions / Restrictions Precautions Precautions: Fall Restrictions Weight Bearing Restrictions: No(Simultaneous filing. User may not have seen previous data.)    Mobility  Bed Mobility               General bed mobility comments: up in chair upon entry   Transfers Overall transfer level: Needs assistance Equipment used: (bed rails) Transfers: Sit to/from Stand Sit to Stand: Mod assist;+2 physical assistance         General transfer comment: Worked on sit<>stand transfers X5-6 using bed rail on the bed. Required mod A +2 for lift assist and steadying assist. Manual cues to block LLE and also required verbal/manual cues for upright posture.   Ambulation/Gait Ambulation/Gait assistance: Max assist;+2 physical assistance   Assistive device: (bed rail on bed)   Gait velocity: Decreased    General Gait Details: Worked on side stepping while holding onto bed rail. Required multimodal cues  for taking steps to the R. Manual blocking required on LLE during stance phase secondary to knee buckling. Max A +2 for steadying.    Stairs             Wheelchair Mobility    Modified Rankin (Stroke Patients Only) Modified Rankin (Stroke Patients Only) Pre-Morbid Rankin Score: Moderate disability Modified Rankin: Moderately severe disability     Balance Overall balance assessment: Needs assistance Sitting-balance support: No upper extremity supported;Feet supported Sitting balance-Leahy Scale: Fair     Standing balance support: Bilateral upper extremity supported Standing balance-Leahy Scale: Poor Standing balance comment: Reliant on BUE support and external support                             Cognition Arousal/Alertness: Awake/alert Behavior During Therapy: WFL for tasks assessed/performed Overall Cognitive Status: Impaired/Different from baseline Area of Impairment: Problem solving                             Problem Solving: Difficulty sequencing;Requires verbal cues        Exercises Other Exercises Other Exercises: Worked on BUE push up in standing using bed rail X 5. Required cues for upright and mod A +2 for steadying.  Other Exercises: Marching activities X5, X5 bilaterally. Required seated rest. Cues for knee extension and upright posture.     General Comments        Pertinent Vitals/Pain Pain Assessment: No/denies  pain    Home Living                      Prior Function Level of Independence: Independent with assistive device(s)          PT Goals (current goals can now be found in the care plan section) Acute Rehab PT Goals Patient Stated Goal: to go back home PT Goal Formulation: With patient Time For Goal Achievement: 11/20/18 Potential to Achieve Goals: Good Progress towards PT goals: Progressing toward goals    Frequency    Min 4X/week      PT Plan Current plan remains appropriate     Co-evaluation PT/OT/SLP Co-Evaluation/Treatment: Yes Reason for Co-Treatment: Complexity of the patient's impairments (multi-system involvement);To address functional/ADL transfers;For patient/therapist safety PT goals addressed during session: Mobility/safety with mobility;Strengthening/ROM;Balance        AM-PAC PT "6 Clicks" Mobility   Outcome Measure  Help needed turning from your back to your side while in a flat bed without using bedrails?: A Lot Help needed moving from lying on your back to sitting on the side of a flat bed without using bedrails?: A Lot Help needed moving to and from a bed to a chair (including a wheelchair)?: A Lot Help needed standing up from a chair using your arms (e.g., wheelchair or bedside chair)?: A Lot Help needed to walk in hospital room?: Total Help needed climbing 3-5 steps with a railing? : Total 6 Click Score: 10    End of Session Equipment Utilized During Treatment: Gait belt Activity Tolerance: Patient tolerated treatment well Patient left: in chair;with call bell/phone within reach;with chair alarm set Nurse Communication: Mobility status PT Visit Diagnosis: Unsteadiness on feet (R26.81);Muscle weakness (generalized) (M62.81);Difficulty in walking, not elsewhere classified (R26.2);Hemiplegia and hemiparesis Hemiplegia - Right/Left: Left Hemiplegia - dominant/non-dominant: Non-dominant Hemiplegia - caused by: Cerebral infarction     Time: 5003-7048 PT Time Calculation (min) (ACUTE ONLY): 24 min  Charges:  $Neuromuscular Re-education: 8-22 mins                     Leighton Ruff, PT, DPT  Acute Rehabilitation Services  Pager: [phone] Office: [phone]    Rudean Hitt 11/09/2018, 1:06 PM

## 2018-11-09 NOTE — PMR Pre-admission (Signed)
PMR Admission Coordinator Pre-Admission Assessment  Patient: Andre Wolfe is an 84 y.o., male MRN: [id number] DOB: Jan 24, 1935 Height: 6' (182.9 cm) Weight: 93.8 kg  Insurance Information HMO:     PPO:      PCP:      IPA:      80/20:      OTHER:  PRIMARY: Medicare A and B      Policy#: [id number]      Subscriber: patient CM Name:       Phone#:      Fax#:  Pre-Cert#: verified via Civil engineer, contracting:  Benefits:  Phone #:      Name:  Eff. Date: 02/12/2002     Deduct: $1408      Out of Pocket Max: n/a      Life Max: n/a CIR: 100%      SNF: 100 days Outpatient: 80%     Co-Pay: 20% Home Health: 100%      Co-Pay:  DME: 80%     Co-Pay: 20% Providers: pt's choice SECONDARY:       Policy#:       Subscriber:  CM Name:       Phone#:      Fax#:  Pre-Cert#:       Employer:  Benefits:  Phone #:      Name:  Eff. Date:      Deduct:       Out of Pocket Max:       Life Max:  CIR:       SNF:  Outpatient:      Co-Pay:  Home Health:       Co-Pay:  DME:      Co-Pay:   Medicaid Application Date:       Case Manager:  Disability Application Date:       Case Worker:   The "Data Collection Information Summary" for patients in Inpatient Rehabilitation Facilities with attached "Privacy Act [place] Records" was provided and verbally reviewed with: Family  Emergency Contact Information Contact Information    Name Relation Home Work [place], [place] Daughter   [phone]   Darcey Nora Relative   [phone]      Current Medical History  Patient Admitting Diagnosis: R paramedian CVA, L parietal CVA  History of Present Illness: Pt is an 84 year old male with history of HTN, CAD, T2DM with peripheral neuropathy and gait disorder, PAF s/p DCCV, CVA 06/2018 with CIR stay and d/c to home at supervision level on 08/02/2018. He was readmitted on 11/05/18 with one day history of  Left facial droop with dysarthria and left facial weakness. He initially refused to come in due to  COVID 19 but symptoms did not resolve therefore was brought in as code stroke the next day. tPA was not given due to outside window.  CT revealed evolution of R pontine infarct.  Neurology was consulted and added ASA on top of Eliquis for stroke prevention.  Therapy evaluations completed and pt was recommended for CIR program.   Complete NIHSS TOTAL: 6  Patient's medical record from [facility]  has been reviewed by the rehabilitation admission coordinator and physician.  Past Medical History  Past Medical History:  Diagnosis Date  . Arthritis    "joints" (07/13/2018)  . Coronary artery disease    a. s/p CABG 12/2015.  Marland Kitchen CVA (cerebral vascular accident) ([place]) 07/13/2018   right pontine infarct; "said he's had one in the  past; probably 2 yrs ago; same symptoms" (07/13/2018)  . Diabetic peripheral neuropathy ([place])   . GERD (gastroesophageal reflux disease)   . Hyperlipidemia   . Hypertension   . Postoperative atrial fibrillation ([place])   . Prostate cancer ([place])    d'x 2015/2016  . TIA (transient ischemic attack)   . Type II diabetes mellitus ([facility])     Family History   family history includes Breast cancer in his mother and sister; Diabetes in his brother; Heart disease in his mother; Leukemia in his father.  Prior Rehab/Hospitalizations Has the patient had prior rehab or hospitalizations prior to admission? Yes. Admitted to CIR from 07/16/2018 through 08/02/2018 for R pontine CVA  Has the patient had major surgery during 100 days prior to admission? No   Current Medications  Current Facility-Administered Medications:  .  acetaminophen (TYLENOL) tablet 650 mg, 650 mg, Oral, Q4H PRN **OR** acetaminophen (TYLENOL) solution 650 mg, 650 mg, Per Tube, Q4H PRN **OR** acetaminophen (TYLENOL) suppository 650 mg, 650 mg, Rectal, Q4H PRN, Karmen Bongo, MD .  amLODipine (NORVASC) tablet 2.5 mg, 2.5 mg, Oral, Daily, Little Ishikawa, MD, 2.5 mg at 11/09/18 0924 .  apixaban (ELIQUIS)  tablet 5 mg, 5 mg, Oral, BID, Karmen Bongo, MD, 5 mg at 11/09/18 0925 .  aspirin EC tablet 81 mg, 81 mg, Oral, Daily, Rosalin Hawking, MD, 81 mg at 11/09/18 0925 .  feeding supplement (ENSURE ENLIVE) (ENSURE ENLIVE) liquid 237 mL, 237 mL, Oral, BID BM, Karmen Bongo, MD, 237 mL at 11/09/18 0925 .  FLUoxetine (PROZAC) capsule 20 mg, 20 mg, Oral, Daily, Metzger-Cihelka, Desiree, NP, 20 mg at 11/09/18 0924 .  gabapentin (NEURONTIN) tablet 600 mg, 600 mg, Oral, QHS, Karmen Bongo, MD, 600 mg at 11/08/18 2138 .  insulin aspart (novoLOG) injection 0-15 Units, 0-15 Units, Subcutaneous, TID WC, Karmen Bongo, MD, 2 Units at 11/09/18 [phone] .  insulin aspart (novoLOG) injection 0-5 Units, 0-5 Units, Subcutaneous, QHS, Karmen Bongo, MD, 2 Units at 11/07/18 2157 .  insulin aspart protamine- aspart (NOVOLOG MIX 70/30) injection 10 Units, 10 Units, Subcutaneous, QAC lunch, Karmen Bongo, MD, 10 Units at 11/08/18 1225 .  insulin aspart protamine- aspart (NOVOLOG MIX 70/30) injection 20 Units, 20 Units, Subcutaneous, Q supper, Karmen Bongo, MD, 20 Units at 11/08/18 1756 .  insulin aspart protamine- aspart (NOVOLOG MIX 70/30) injection 30 Units, 30 Units, Subcutaneous, Q breakfast, Karmen Bongo, MD, 30 Units at 11/09/18 [phone] .  metoprolol tartrate (LOPRESSOR) tablet 12.5 mg, 12.5 mg, Oral, BID, Rosalin Hawking, MD, 12.5 mg at 11/09/18 0925 .  pantoprazole (PROTONIX) EC tablet 40 mg, 40 mg, Oral, Daily, Karmen Bongo, MD, 40 mg at 11/09/18 0925 .  senna-docusate (Senokot-S) tablet 1 tablet, 1 tablet, Oral, QHS PRN, Karmen Bongo, MD  Patients Current Diet:  Diet Order            Diet - low sodium heart healthy        Diet heart healthy/carb modified Room service appropriate? Yes with Assist; Fluid consistency: Thin  Diet effective now              Precautions / Restrictions Precautions Precautions: Fall Restrictions Weight Bearing Restrictions: No   Has the patient had 2 or more falls or  a fall with injury in the past year? Yes, 1 fall prior to admission to hospital in Dec of 2019 and 1 fall while on rehab in Jan of 2020.   Prior Activity Level Limited Community (1-2x/wk): prior to admission to CIR in Jan  2020, went out with wife shopping, used AD for balance/endurance.  Since CIR has been more home bound  Prior Functional Level Self Care: Did the patient need help bathing, dressing, using the toilet or eating? Independent  Indoor Mobility: Did the patient need assistance with walking from room to room (with or without device)? Needed some help (supervision)  Stairs: Did the patient need assistance with internal or external stairs (with or without device)? Needed some help  (supervision)  Functional Cognition: Did the patient need help planning regular tasks such as shopping or remembering to take medications? Needed some help  Home Assistive Devices / [place] Devices/Equipment: Kasandra Knudsen (specify quad or straight), Walker (specify type) Home Equipment: Environmental consultant - 2 wheels, Cane - single point, Bedside commode, Shower seat, Grab bars - tub/shower, Wheelchair - manual  Prior Device Use: Indicate devices/aids used by the patient prior to current illness, exacerbation or injury? Walker  Current Functional Level Cognition  Arousal/Alertness: Awake/alert Overall Cognitive Status: Impaired/Different from baseline Orientation Level: Oriented X4 Safety/Judgement: Decreased awareness of safety, Decreased awareness of deficits General Comments: pt oriented and able to follow all commands today Attention: Focused, Sustained Focused Attention: Appears intact Memory: Impaired(Immediate: 3/3; Delayed: 2/3) Memory Impairment: Retrieval deficit, Decreased recall of new information Awareness: Appears intact Problem Solving: Impaired Problem Solving Impairment: Verbal complex(4/5)    Extremity Assessment (includes Sensation/Coordination)  Upper Extremity Assessment:  Defer to OT evaluation LUE Deficits / Details: [place] 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  LLE Sensation: WNL LLE Coordination: decreased gross motor, decreased fine motor    ADLs  Overall ADL's : Needs assistance/impaired Eating/Feeding: Set up, Sitting Grooming: Moderate assistance, Sitting Upper Body Bathing: Moderate assistance, Sitting Lower Body Bathing: Maximal assistance Lower Body Bathing Details (indicate cue type and reason): Mod A +2 sit<>stand Upper Body Dressing : Moderate assistance, Sitting Lower Body Dressing: Total assistance Lower Body Dressing Details (indicate cue type and reason): Mod A +2 sit<>stand Toilet Transfer: Moderate assistance, +2 for physical assistance, Stand-pivot Toilet Transfer Details (indicate cue type and reason): Bed>recliner going pt's right with RW Toileting- Clothing Manipulation and Hygiene: Maximal assistance Toileting - Clothing Manipulation Details (indicate cue type and reason): Mod A +2 sit<>stand    Mobility  Overal bed mobility: Needs Assistance Bed Mobility: Supine to Sit Supine to sit: Mod assist, HOB elevated General bed mobility comments: pt up in chair today    Transfers  Overall transfer level: Needs assistance Equipment used: Rolling walker (2 wheeled) Transfer via Lift Equipment: Stedy Transfers: Sit to/from Stand Sit to Stand: Mod assist, Max assist, +2 physical assistance Stand pivot transfers: Mod assist, +2 physical assistance General transfer comment: attempted to work on sit to stand up to RW however pt fatigued quickly in standing. used the stedy to allow for seated breaks and more support for PT and tech. completed 3 standing trials working on maintaining midline posture instead of leaning to the L. Pt requiring mod tactile cues at L knee to achieve knee  extension and prevent buckling. also worked on pulling/pushing with L UE on cross bar of stedy    Ambulation / Gait / Stairs / Office manager / Balance Balance Overall balance assessment: Needs assistance Sitting-balance support: No upper extremity supported, Feet supported Sitting balance-Leahy Scale: Fair Standing balance support: Bilateral upper extremity supported Standing balance-Leahy Scale: Poor Standing balance  comment: Reliant on BUE support     Special needs/care consideration BiPAP/CPAP no CPM no Continuous Drip IV no Dialysis no        Days n/a Life Vest no Oxygen no Special Bed no Trach Size no Wound Vac (area) no      Location n/a Skin intact                              Location n/a Bowel mgmt: continent, last BM 11/07/2018 Bladder mgmt: continent Diabetic mgmt: yes Behavioral consideration no Chemo/radiation no   Previous Home Environment (from acute therapy documentation) Living Arrangements: Spouse/significant other  Lives With: Spouse Available Help at Discharge: Family, Available 24 hours/day Type of Home: House Home Layout: One level Home Access: Level entry, Ramped entrance Bathroom Shower/Tub: Multimedia programmer: Standard Bathroom Accessibility: Yes Home Care Services: No Additional Comments: sideways with RW if needed  Discharge Living Setting Plans for Discharge Living Setting: Patient's home Type of Home at Discharge: House Discharge Home Layout: One level Discharge Home Access: Shallowater entrance Discharge Bathroom Shower/Tub: Walk-in shower(with lip) Discharge Bathroom Toilet: Standard Discharge Bathroom Accessibility: Yes How Accessible: Accessible via walker(side stepping with walker) Does the patient have any problems obtaining your medications?: No  Social/Family/Support Systems Patient Roles: Spouse, Parent Anticipated Caregiver: wife Silva Bandy) and daughter [name]) Anticipated Caregiver's Contact  Information: [name] [phone] Ability/Limitations of Caregiver: pt's wife to provide 24/7, [name] lives close by if needed Caregiver Availability: 24/7 Discharge Plan Discussed with Primary Caregiver: Yes Is Caregiver In Agreement with Plan?: Yes Does Caregiver/Family have Issues with Lodging/Transportation while Pt is in Rehab?: No  Goals/Additional Needs Patient/Family Goal for Rehab: PT/OT/ST, supervision Expected length of stay: 18-20 days Dietary Needs: heart health/carb modified, thin liquids Equipment Needs: tbd Special Service Needs: n/a Additional Information: n/a Pt/Family Agrees to Admission and willing to participate: Yes Program Orientation Provided & Reviewed with Pt/Caregiver Including Roles  & Responsibilities: Yes Additional Information Needs: n/a Information Needs to be Provided By: n/a   Possible need for SNF placement upon discharge: not anticipated  Patient Condition: I have reviewed [facility] records [facility], spoken with CM, and patient and daughter. I met with patient at the bedside and discussed with daughter via phone for inpatient rehabilitation assessment.  Patient will benefit from ongoing PT, OT and SLP, can actively participate in 3 hours of therapy a day 5 days of the week, and can make measurable gains during the admission.  Patient will also benefit from the coordinated team approach during an Inpatient Acute Rehabilitation admission.  The patient will receive intensive therapy as well as Rehabilitation physician, nursing, social worker, and care management interventions.  Due to safety, skin/wound care, disease management, medication administration, pain management and patient education the patient requires 24 hour a day rehabilitation nursing.  The patient is currently mod to max +2 with mobility and basic ADLs.  Discharge setting and therapy post discharge at home with home health is anticipated.  Patient has agreed to participate in the Acute  Inpatient Rehabilitation Program and will admit today.  Preadmission Screen Completed By:  Michel Santee PT, DPT 11/09/2018 12:14 PM ______________________________________________________________________   Discussed status with Dr. Naaman Plummer on 11/09/18 at 12:24 PM and received approval for admission today.  Admission Coordinator:  Michel Santee, PT, DPT time 12:24 PM Sudie Grumbling 11/09/18    Assessment/Plan: Diagnosis: right pontine infarct  1. Does the need for close, 24 hr/day Medical  supervision in concert with the patient's rehab needs make it unreasonable for this patient to be served in a less intensive setting? Yes 2. Co-Morbidities requiring supervision/potential complications: HTN, CAD, DM 3. Due to bladder management, bowel management, safety, skin/wound care, disease management, medication administration, pain management and patient education, does the patient require 24 hr/day rehab nursing? Yes 4. Does the patient require coordinated care of a physician, rehab nurse, PT (1-2 hrs/day, 5 days/week), OT (1-2 hrs/day, 5 days/week) and SLP (1-2 hrs/day, 5 days/week) to address physical and functional deficits in the context of the above medical diagnosis(es)? Yes Addressing deficits in the following areas: balance, endurance, locomotion, strength, transferring, bowel/bladder control, bathing, dressing, feeding, grooming, toileting, speech, swallowing and psychosocial support 5. Can the patient actively participate in an intensive therapy program of at least 3 hrs of therapy 5 days a week? Yes 6. The potential for patient to make measurable gains while on inpatient rehab is excellent 7. Anticipated functional outcomes upon discharge from inpatients are: supervision PT, supervision OT, supervision SLP 8. Estimated rehab length of stay to reach the above functional goals is: 17-20 days 9. Anticipated D/C setting: Home 10. Anticipated post D/C treatments: [place] therapy 11. Overall Rehab/Functional  Prognosis: excellent  MD Signature: Meredith Staggers, MD, [facility] 11/09/2018

## 2018-11-09 NOTE — Progress Notes (Signed)
Patient belongings packed and patient taken via bed by nurse and CNA to [facility] [address]. Report called to [employer], RN at little after noon.

## 2018-11-09 NOTE — TOC Transition Note (Signed)
Transition of Care [facility]) - CM/SW Discharge Note   Patient Details  Name: Andre Wolfe MRN: [id number] Date of Birth: June 16, 1935  [facility] CM/SW Contact:  Pollie Friar, RN Phone Number: 11/09/2018, 11:23 AM   Clinical Narrative:    Pt discharging to CIR today. CM signing off.    Final next level of care: IP Rehab Facility Barriers to Discharge: Barriers Resolved   Patient Goals and CMS Choice Patient states their goals for this hospitalization and ongoing recovery are:: to use lt leg again      Discharge Placement                       Discharge Plan and Services                                     Social Determinants of Health (SDOH) Interventions     Readmission Risk Interventions No flowsheet data found.

## 2018-11-09 NOTE — Progress Notes (Signed)
Inpatient Rehab Admissions Coordinator:   I have a bed available and attending approval for rehab admission today.  I will alert pt/family, and CM.   Shann Medal, PT, DPT Admissions Coordinator [phone] 11/09/18  11:31 AM

## 2018-11-10 ENCOUNTER — Inpatient Hospital Stay (HOSPITAL_COMMUNITY): Payer: Medicare Other | Admitting: Physical Therapy

## 2018-11-10 ENCOUNTER — Inpatient Hospital Stay (HOSPITAL_COMMUNITY): Payer: Medicare Other | Admitting: Occupational Therapy

## 2018-11-10 ENCOUNTER — Inpatient Hospital Stay (HOSPITAL_COMMUNITY): Payer: Medicare Other | Admitting: Speech Pathology

## 2018-11-10 DIAGNOSIS — G8194 Hemiplegia, unspecified affecting left nondominant side: Secondary | ICD-10-CM

## 2018-11-10 LAB — CBC WITH DIFFERENTIAL/PLATELET
Abs Immature Granulocytes: 0.02 10*3/uL (ref 0.00–0.07)
Basophils Absolute: 0.1 10*3/uL (ref 0.0–0.1)
Basophils Relative: 1 %
Eosinophils Absolute: 0.3 10*3/uL (ref 0.0–0.5)
Eosinophils Relative: 3 %
HCT: 44.6 % (ref 39.0–52.0)
Hemoglobin: 15.2 g/dL (ref 13.0–17.0)
Immature Granulocytes: 0 %
Lymphocytes Relative: 22 %
Lymphs Abs: 1.7 10*3/uL (ref 0.7–4.0)
MCH: 29.7 pg (ref 26.0–34.0)
MCHC: 34.1 g/dL (ref 30.0–36.0)
MCV: 87.1 fL (ref 80.0–100.0)
Monocytes Absolute: 0.9 10*3/uL (ref 0.1–1.0)
Monocytes Relative: 12 %
Neutro Abs: 4.7 10*3/uL (ref 1.7–7.7)
Neutrophils Relative %: 62 %
Platelets: 227 10*3/uL (ref 150–400)
RBC: 5.12 MIL/uL (ref 4.22–5.81)
RDW: 12.9 % (ref 11.5–15.5)
WBC: 7.6 10*3/uL (ref 4.0–10.5)
nRBC: 0 % (ref 0.0–0.2)

## 2018-11-10 LAB — COMPREHENSIVE METABOLIC PANEL
ALT: 21 U/L (ref 0–44)
AST: 17 U/L (ref 15–41)
Albumin: 3.4 g/dL — ABNORMAL LOW (ref 3.5–5.0)
Alkaline Phosphatase: 50 U/L (ref 38–126)
Anion gap: 9 (ref 5–15)
BUN: 30 mg/dL — ABNORMAL HIGH (ref 8–23)
CO2: 24 mmol/L (ref 22–32)
Calcium: 9.1 mg/dL (ref 8.9–10.3)
Chloride: 106 mmol/L (ref 98–111)
Creatinine, Ser: 1.3 mg/dL — ABNORMAL HIGH (ref 0.61–1.24)
GFR calc Af Amer: 59 mL/min — ABNORMAL LOW (ref >60)
GFR calc non Af Amer: 51 mL/min — ABNORMAL LOW (ref >60)
Glucose, Bld: 191 mg/dL — ABNORMAL HIGH (ref 70–99)
Potassium: 3.9 mmol/L (ref 3.5–5.1)
Sodium: 139 mmol/L (ref 135–145)
Total Bilirubin: 0.7 mg/dL (ref 0.3–1.2)
Total Protein: 6.3 g/dL — ABNORMAL LOW (ref 6.5–8.1)

## 2018-11-10 LAB — GLUCOSE, CAPILLARY
Glucose-Capillary: 148 mg/dL — ABNORMAL HIGH (ref 70–99)
Glucose-Capillary: 225 mg/dL — ABNORMAL HIGH (ref 70–99)
Glucose-Capillary: 207 mg/dL — ABNORMAL HIGH (ref 70–99)
Glucose-Capillary: 127 mg/dL — ABNORMAL HIGH (ref 70–99)

## 2018-11-10 NOTE — Progress Notes (Signed)
Social Work  Social Work Assessment and Plan  Patient Details  Name: Andre Wolfe MRN: [id number] Date of Birth: 11/30/1934  Today's Date: 11/10/2018  Problem List:  Patient Active Problem List   Diagnosis Date Noted  . Bradycardia   . Right pontine stroke ([place]) 07/16/2018  . Acute ischemic stroke ([place])   . Essential hypertension   . Poorly controlled type 2 diabetes mellitus with peripheral neuropathy ([place])   . Tobacco abuse   . PAF (paroxysmal atrial fibrillation) ([place])   . Right pontine CVA ([place]) 07/13/2018  . Hypercholesterolemia 05/04/2018   Past Medical History:  Past Medical History:  Diagnosis Date  . Arthritis    "joints" (07/13/2018)  . Coronary artery disease    a. s/p CABG 12/2015.  Marland Kitchen CVA (cerebral vascular accident) ([place]) 07/13/2018   right pontine infarct; "said he's had one in the past; probably 2 yrs ago; same symptoms" (07/13/2018)  . Diabetic peripheral neuropathy ([place])   . GERD (gastroesophageal reflux disease)   . Hyperlipidemia   . Hypertension   . Postoperative atrial fibrillation ([place])   . Prostate cancer ([place])    d'x 2015/2016  . TIA (transient ischemic attack)   . Type II diabetes mellitus ([place])    Past Surgical History:  Past Surgical History:  Procedure Laterality Date  . CARDIAC CATHETERIZATION  2017   "not completed; went straight to [facility]"  . CATARACT EXTRACTION W/ INTRAOCULAR LENS  IMPLANT, BILATERAL Bilateral   . CORONARY ARTERY BYPASS GRAFT  2017   "CABG X 4"  . TONSILLECTOMY     Social History:  reports that he quit smoking about 4 months ago. He has a 7.80 pack-year smoking history. He has never used smokeless tobacco. He reports previous alcohol use. He reports that he does not use drugs.  Family / Support Systems Marital Status: Married Patient Roles: Spouse, Parent Spouse/Significant Other: Phyllis-[phone] Children: Becky Putter-daughter [phone] Other Supports: Benjie Karvonen son in-law Anticipated Caregiver: Wife and  daughter Ability/Limitations of Caregiver: Wife is in good health and has assisted him in 07/2018 after CIR stay then. Daughter and son in-law are close by and will assist Caregiver Availability: 24/7 Family Dynamics: Close knit family-pt and wife moved here to be closer to their daughter and her family. They have friends and church members who are supportive and will check on them.  Social History Preferred language: English Religion: Christian Cultural Background: No issues Education: Secretary/administrator educated Read: Yes Write: Yes Employment Status: Retired Public relations account executive Issues: No issues Guardian/Conservator: None-according to MD pt is capable of making his own decisions while here, will include wife if any decision needs to be made while here   Abuse/Neglect Abuse/Neglect Assessment Can Be Completed: Yes Physical Abuse: Denies Verbal Abuse: Denies Sexual Abuse: Denies Exploitation of patient/patient's resources: Denies Self-Neglect: Denies  Emotional Status Pt's affect, behavior and adjustment status: Pt progressed to supervision level when here on rehab in Jan. He hopes to ge tot his level again so it will not burden his wife. He was doing well at home prior to this and is not happy he has start over again.  Recent Psychosocial Issues: other health issues-hx CVA 06/2018 and here on CIR Psychiatric History: History of depression-anixety takes medicaitons for this and feels it helps but this new CVA is not helping. Will make referral for neuro-psych to see while here. He would bnenefit since his strokes have been close together. Substance Abuse History: No issues  Patient / Family Perceptions, Expectations & Goals Pt/Family  understanding of illness & functional limitations: Pt and wife can explain his stroke he feels atleast it is on the same side as the other one. He has spoken with the MD and feels he has a good understanding of his treatment plan going forward. His wife wants  to be kept updated on his medical issues. Premorbid pt/family roles/activities: husband, father, grandfather, retiree, church member, etc Anticipated changes in roles/activities/participation: resume Pt/family expectations/goals: Pt states: " I want to get back to where I was before this happened again."  Wife states: " I hope he does as well as he did last time."  [employer]: Other (Comment) Premorbid Home Care/DME Agencies: Other (Comment)([facility]-07/2018 and has rw, tub seat, wc) Transportation available at discharge: Wife and daughter Resource referrals recommended: Neuropsychology, Support group (specify)  Discharge Planning Living Arrangements: Spouse/significant other Support Systems: Spouse/significant other, Children, Friends/neighbors, Other relatives, Church/faith community Type of Residence: Private residence Insurance Resources: [employer] Financial Resources: [place] Referred: No Living Expenses: Own Money Management: Spouse, Patient Does the patient have any problems obtaining your medications?: No Home Management: Wife Patient/Family Preliminary Plans: Return home with wife and daughter can come by and assist it needed. Wife is in good health and able to assist. When he wss here in Jan he required supervision at discharge, both are hopeful this will occur again. Will await team evaluations and work on discharge needs. Social Work Anticipated Follow Up Needs: HH/OP, Support Group  Clinical Impression Pleasant gentleman who is not happy he is back here after having another CVA. He was doing well at home until this and did not want to come back here. His wife is involved and able to assist along with their daughter. Will await team evaluations and make neuro-psych referral for coping. Hopefully can reach supervision level where he was before at DC from CIR.  Elease Hashimoto 11/10/2018, 11:06 AM

## 2018-11-10 NOTE — Evaluation (Signed)
Occupational Therapy Assessment and Plan  Patient Details  Name: Andre Wolfe MRN: [id number] Date of Birth: 1935/06/27  OT Diagnosis: abnormal posture, cognitive deficits, hemiplegia affecting non-dominant side and muscle weakness (generalized) Rehab Potential: Rehab Potential (ACUTE ONLY): Good ELOS: 3 weeks   Today's Date: 11/10/2018 OT Individual Time: 1105-1200 OT Individual Time Calculation (min): 55 min     Problem List:  Patient Active Problem List   Diagnosis Date Noted  . Bradycardia   . Right pontine stroke ([place]) 07/16/2018  . Acute ischemic stroke ([place])   . Essential hypertension   . Poorly controlled type 2 diabetes mellitus with peripheral neuropathy ([place])   . Tobacco abuse   . PAF (paroxysmal atrial fibrillation) ([place])   . Right pontine CVA ([place]) 07/13/2018  . Hypercholesterolemia 05/04/2018    Past Medical History:  Past Medical History:  Diagnosis Date  . Arthritis    "joints" (07/13/2018)  . Coronary artery disease    a. s/p CABG 12/2015.  Marland Kitchen CVA (cerebral vascular accident) ([place]) 07/13/2018   right pontine infarct; "said he's had one in the past; probably 2 yrs ago; same symptoms" (07/13/2018)  . Diabetic peripheral neuropathy ([place])   . GERD (gastroesophageal reflux disease)   . Hyperlipidemia   . Hypertension   . Postoperative atrial fibrillation ([place])   . Prostate cancer ([place])    d'x 2015/2016  . TIA (transient ischemic attack)   . Type II diabetes mellitus ([place])    Past Surgical History:  Past Surgical History:  Procedure Laterality Date  . CARDIAC CATHETERIZATION  2017   "not completed; went straight to [facility]"  . CATARACT EXTRACTION W/ INTRAOCULAR LENS  IMPLANT, BILATERAL Bilateral   . CORONARY ARTERY BYPASS GRAFT  2017   "CABG X 4"  . TONSILLECTOMY      Assessment & Plan Clinical Impression: Patient is a 84 y.o. history of HTN, CAD, T2DM with peripheral neuropathy and gait disorder, PAF s/p DCCV, CVA 06/2018 with CIR stay and d/c to home at  supervision level. He was readmitted on 11/05/18 with one day history of Left facial droop with dysarthria and left facial weakness. He refused to come in due to COVID 19 but symptoms did not resolve therefore was brought in as code stroke. CTA head/neck was negative for LVO and showed stable widespread intracranial atherosclerosis with chronic severe L-PCA origin stenosis. Marland Kitchen MRI brain done revealing recurrent right pontine stroke and Dr. Erlinda Hong recommended adding ASA to Eliquis for stroke felt to be due to small vessel disease. Prozac added for reactive depression with anxiety. Patient with resultant left facial weakness with mild dysarthria and left sided weakness affecting mobility and ADLs. CIR recommended due to functional decline.  Patient transferred to CIR on 11/09/2018 .    Patient currently requires max with basic self-care skills secondary to muscle weakness, decreased cardiorespiratoy endurance, impaired timing and sequencing, unbalanced muscle activation and decreased coordination, decreased problem solving, decreased safety awareness and decreased memory and decreased standing balance, hemiplegia and decreased balance strategies.  Prior to hospitalization, patient could complete ADLs with supervision.  Patient will benefit from skilled intervention to increase independence with basic self-care skills prior to discharge home with care partner.  Anticipate patient will require 24 hour supervision and minimal physical assistance and follow up home health.  OT - End of Session Activity Tolerance: Tolerates 30+ min activity with multiple rests Endurance Deficit: Yes Endurance Deficit Description: easily fatigued OT Assessment Rehab Potential (ACUTE ONLY): Good OT Patient demonstrates impairments in the  following area(s): Balance;Cognition;Endurance;Motor;Pain;Perception;Safety;Sensory;Skin Integrity OT Basic ADL's Functional Problem(s): Grooming;Bathing;Dressing;Toileting OT Transfers Functional  Problem(s): Toilet;Tub/Shower OT Additional Impairment(s): Fuctional Use of Upper Extremity OT Plan OT Intensity: Minimum of 1-2 x/day, 45 to 90 minutes OT Frequency: 5 out of 7 days OT Duration/Estimated Length of Stay: 3 weeks OT Treatment/Interventions: Balance/vestibular training;Cognitive remediation/compensation;Community reintegration;Discharge planning;Disease mangement/prevention;DME/adaptive equipment instruction;Functional electrical stimulation;Functional mobility training;Neuromuscular re-education;Pain management;Patient/family education;Psychosocial support;Self Care/advanced ADL retraining;Skin care/wound managment;Splinting/orthotics;Therapeutic Activities;Therapeutic Exercise;UE/LE Strength taining/ROM;UE/LE Coordination activities;Wheelchair propulsion/positioning OT Self Feeding Anticipated Outcome(s): Supervision/setup OT Basic Self-Care Anticipated Outcome(s): Supervision OT Toileting Anticipated Outcome(s): Supervision OT Bathroom Transfers Anticipated Outcome(s): Supervision OT Recommendation Patient destination: Home Follow Up Recommendations: Home health OT;24 hour supervision/assistance Equipment Recommended: To be determined   Skilled Therapeutic Intervention OT eval completed with discussion of rehab process, OT purpose, POC, ELOS, and goals.  ADL assessment deferred as pt had dressed during PT session.  Pt completed grooming tasks with assistance to open containers and cues to utilize LUE as stabilizer during tasks.  Pt reports LUE "doesn't work", however able to utilize as stabilizer to gross assist during grooming tasks.  Squat pivot transfer max assist to Rt and stand pivot transfers mod-max assist to Rt and max to Lt.  Engaged in reaching activity with LUE with therapist providing support at elbow to facilitate shoulder activation during reaching.  Pt demonstrating good grasp when picking up cups, requiring assist to release cups.  Returned to room and left in bed  max assist stand pivot to Lt with all needs in reach.  OT Evaluation Precautions/Restrictions  Precautions Precautions: Fall Restrictions Weight Bearing Restrictions: No Pain Pain Assessment Pain Scale: 0-10 Pain Score: 0-No pain Home Living/Prior Functioning Home Living Family/patient expects to be discharged to:: Private residence Living Arrangements: Spouse/significant other Available Help at Discharge: Family, Available 24 hours/day Type of Home: House Home Access: Level entry, Ramped entrance Home Layout: One level Bathroom Shower/Tub: Multimedia programmer: Standard Bathroom Accessibility: Yes  Lives With: Spouse Prior Function Level of Independence: Requires assistive device for independence, Independent with basic ADLs  Able to Take Stairs?: Yes Driving: No Vocation: Retired Comments: about 2 months ago was using a cane in the community only; went shopping with his wife - used grocery cart; does not drive Vision Baseline Vision/History: Wears glasses Wears Glasses: Reading only Patient Visual Report: No change from baseline Vision Assessment?: Yes Eye Alignment: Within Functional Limits Ocular Range of Motion: Within Functional Limits Alignment/Gaze Preference: Gaze right Tracking/Visual Pursuits: Able to track stimulus in all quads without difficulty Convergence: Within functional limits Visual Fields: No apparent deficits Cognition Arousal/Alertness: Awake/alert Orientation Level: Person;Situation;Place Person: Oriented Place: Oriented Situation: Oriented Year: 2020 Month: April Day of Week: Correct Memory: Impaired Memory Impairment: Retrieval deficit;Decreased recall of new information Immediate Memory Recall: Sock;Blue;Bed Memory Recall: Sock;Blue;Bed Memory Recall Sock: Without Cue Memory Recall Blue: With Cue Memory Recall Bed: With Cue(on 2nd attempt) Focused Attention: Appears intact Awareness: Appears intact Problem Solving:  Impaired Sensation Sensation Light Touch: Impaired Detail Light Touch Impaired Details: Impaired LLE;Impaired LUE Proprioception: Impaired Detail Proprioception Impaired Details: Impaired LLE;Impaired LUE Coordination Gross Motor Movements are Fluid and Coordinated: No Fine Motor Movements are Fluid and Coordinated: No Coordination and Movement Description: Lt hemiplegia Finger Nose Finger Test: unable to complete with LUE due to hemiplegia Motor  Motor Motor: Abnormal tone;Hemiplegia;Abnormal postural alignment and control Motor - Skilled Clinical Observations: Lt hemiplegia Mobility  Bed Mobility Bed Mobility: Supine to Sit Supine to Sit: Moderate Assistance - Patient 50-74% Transfers Sit to  Stand: Maximal Assistance - Patient 25-49%  Trunk/Postural Assessment  Cervical Assessment Cervical Assessment: (fwd head) Thoracic Assessment Thoracic Assessment: (rounded shoulders) Lumbar Assessment Lumbar Assessment: (posterior pelvic tilt) Postural Control Postural Control: Deficits on evaluation Righting Reactions: delayed and inadequate  Balance Balance Balance Assessed: Yes Dynamic Sitting Balance Sitting balance - Comments: min A due to Lt lean Static Standing Balance Static Standing - Comment/# of Minutes: max A Dynamic Standing Balance Dynamic Standing - Comments: total A Extremity/Trunk Assessment RUE Assessment RUE Assessment: Within Functional Limits General Strength Comments: 4+/5 LUE Assessment LUE Assessment: Exceptions to [facility] Passive Range of Motion (PROM) Comments: shoulder flexion limited to 140* by tightness at end range Active Range of Motion (AROM) Comments: 20* shoulder flexion against gravity, elbow flexion WNL, extension to -20* from neutral, wrist flexion/extension WNL General Strength Comments: 3-/5  bicep, tricep, grip LUE Body System: Neuro Brunstrum levels for arm and hand: Arm;Hand Brunstrum level for arm: Stage II Synergy is  developing Brunstrum level for hand: Stage IV Movements deviating from synergies     Refer to Care Plan for Long Term Goals  Recommendations for other services: None    Discharge Criteria: Patient will be discharged from OT if patient refuses treatment 3 consecutive times without medical reason, if treatment goals not met, if there is a change in medical status, if patient makes no progress towards goals or if patient is discharged from hospital.  The above assessment, treatment plan, treatment alternatives and goals were discussed and mutually agreed upon: by patient  [name] 11/10/2018, 12:25 PM

## 2018-11-10 NOTE — Progress Notes (Signed)
Inpatient Rehabilitation  Patient information reviewed and entered into eRehab system by Tonee Silverstein M. Sarahanne Novakowski, M.A., CCC/SLP, PPS Coordinator.  Information including medical coding, functional ability and quality indicators will be reviewed and updated through discharge.    

## 2018-11-10 NOTE — Evaluation (Signed)
Physical Therapy Assessment and Plan  Patient Details  Name: Andre Wolfe MRN: [id number] Date of Birth: May 16, 1935  PT Diagnosis: Abnormal posture, Difficulty walking, Hemiplegia non-dominant, Impaired sensation and Muscle weakness Rehab Potential: Good ELOS: 18-21 days   Today's Date: 11/10/2018 PT Individual Time: 0830-0940 PT Individual Time Calculation (min): 70 min    Problem List:  Patient Active Problem List   Diagnosis Date Noted  . Bradycardia   . Right pontine stroke ([place]) 07/16/2018  . Acute ischemic stroke ([place])   . Essential hypertension   . Poorly controlled type 2 diabetes mellitus with peripheral neuropathy ([place])   . Tobacco abuse   . PAF (paroxysmal atrial fibrillation) ([place])   . Right pontine CVA ([place]) 07/13/2018  . Hypercholesterolemia 05/04/2018    Past Medical History:  Past Medical History:  Diagnosis Date  . Arthritis    "joints" (07/13/2018)  . Coronary artery disease    a. s/p CABG 12/2015.  Marland Kitchen CVA (cerebral vascular accident) ([place]) 07/13/2018   right pontine infarct; "said he's had one in the past; probably 2 yrs ago; same symptoms" (07/13/2018)  . Diabetic peripheral neuropathy ([place])   . GERD (gastroesophageal reflux disease)   . Hyperlipidemia   . Hypertension   . Postoperative atrial fibrillation ([place])   . Prostate cancer ([place])    d'x 2015/2016  . TIA (transient ischemic attack)   . Type II diabetes mellitus ([place])    Past Surgical History:  Past Surgical History:  Procedure Laterality Date  . CARDIAC CATHETERIZATION  2017   "not completed; went straight to [facility]"  . CATARACT EXTRACTION W/ INTRAOCULAR LENS  IMPLANT, BILATERAL Bilateral   . CORONARY ARTERY BYPASS GRAFT  2017   "CABG X 4"  . TONSILLECTOMY      Assessment & Plan Clinical Impression: Patient is a 84 y.o. year old male with recent admission to the hospital on 11/05/18 with one day history of Left facial droop with dysarthria and left facial weakness. He refused to come in  due to COVID 19 but symptoms did not resolve therefore was brought in as code stroke. CTA head/neck was negative for LVO and showed stable widespread intracranial atherosclerosis with chronic severe L-PCA origin stenosis. Marland Kitchen MRI brain done revealing recurrent right pontine stroke and Dr. Erlinda Hong recommended adding ASA to Eliquis for stroke felt to be due to small vessel disease. Prozac added for reactive depression with anxiety. Patient with resultant left facial weakness with mild dysarthria and left sided weakness affecting mobility and ADLs.  Patient transferred to CIR on 11/09/2018 .   Patient currently requires max with mobility secondary to muscle weakness, abnormal tone, unbalanced muscle activation, decreased coordination and decreased motor planning and decreased sitting balance, decreased standing balance, decreased postural control, hemiplegia and decreased balance strategies.  Prior to hospitalization, patient was modified independent  with mobility and lived with Spouse in a House home.  Home access is  Level entry, Ramped entrance.  Patient will benefit from skilled PT intervention to maximize safe functional mobility, minimize fall risk and decrease caregiver burden for planned discharge home with 24 hour supervision.  Anticipate patient will benefit from follow up [place] at discharge.  PT - End of Session Activity Tolerance: Tolerates 30+ min activity with multiple rests Endurance Deficit: Yes PT Assessment Rehab Potential (ACUTE/IP ONLY): Good PT Barriers to Discharge: Decreased caregiver support PT Patient demonstrates impairments in the following area(s): Balance;Endurance;Motor;Safety;Sensory PT Transfers Functional Problem(s): Bed Mobility;Bed to Chair;Car;Furniture PT Locomotion Functional Problem(s): Stairs;Wheelchair Mobility;Ambulation PT Plan  PT Intensity: Minimum of 1-2 x/day ,45 to 90 minutes PT Frequency: 5 out of 7 days PT Duration Estimated Length of Stay: 18-21 days PT  Treatment/Interventions: Ambulation/gait training;Community reintegration;DME/adaptive equipment instruction;Neuromuscular re-education;Stair training;UE/LE Strength taining/ROM;Wheelchair propulsion/positioning;Balance/vestibular training;Discharge planning;Functional electrical stimulation;Pain management;Therapeutic Activities;UE/LE Coordination activities;Therapeutic Exercise;Splinting/orthotics;Patient/family education;Functional mobility training;Cognitive remediation/compensation PT Transfers Anticipated Outcome(s): supervision PT Locomotion Anticipated Outcome(s): min A gait  Skilled Therapeutic Intervention Pt participated in skilled PT eval and was educated on PT POC and goals. Pt performs bed mobility with mod A for supine to sit, min A for sitting balance due to LOB to left.  Sit <> stand to don pants with multiple attempts, max A due to Lt LE weakness.  Squat pivot transfers with multimodal cues for motor planning and technique, max A.  W/c mobility with hemi technique with supervision in controlled environment.  Gait with +2 assist with RW with manual facilitation for wt shifts, Lt LE advancement and coordination.  Attempted simulated car transfer.  Pt unable to problem solve transfer to his Lt side despite max multimodal cues and max A.  Pt left in w/c with alarm set, needs at hand.  PT Evaluation Precautions/Restrictions Precautions Precautions: Fall Restrictions Weight Bearing Restrictions: No Pain Pain Assessment Pain Score: 0-No pain Home Living/Prior Functioning Home Living Available Help at Discharge: Family;Available 24 hours/day Type of Home: House Home Access: Level entry;Ramped entrance Home Layout: One level  Lives With: Spouse Prior Function Level of Independence: Requires assistive device for independence Driving: No Vocation: Retired Comments: about 2 months ago was using a cane in the community only; went shopping with his wife - used grocery cart; does not  drive  Cognition Arousal/Alertness: Awake/alert Orientation Level: Oriented to place;Oriented to person Focused Attention: Appears intact Sensation Sensation Light Touch: Impaired Detail Light Touch Impaired Details: Impaired LLE;Impaired LUE Proprioception: Impaired Detail Proprioception Impaired Details: Impaired LLE;Impaired LUE Coordination Gross Motor Movements are Fluid and Coordinated: No Fine Motor Movements are Fluid and Coordinated: No Coordination and Movement Description: Lt hemiplegia Motor  Motor Motor: Abnormal tone;Hemiplegia;Abnormal postural alignment and control Motor - Skilled Clinical Observations: Lt hemiplegia  Mobility Bed Mobility Bed Mobility: Supine to Sit Supine to Sit: Moderate Assistance - Patient 50-74% Transfers Transfers: Sit to [employer] Pivot Transfers Sit to Stand: Maximal Assistance - Patient 25-49% Squat Pivot Transfers: Maximal Assistance - Patient 25-49% Locomotion  Gait Ambulation: Yes Gait Assistance: 2 Helpers Gait Distance (Feet): 15 Feet Assistive device: Rolling walker Stairs / Additional Locomotion Stairs: No Wheelchair Mobility Wheelchair Mobility: Yes Wheelchair Assistance: Chartered loss adjuster: Right upper extremity;Right lower extremity Wheelchair Parts Management: Needs assistance Distance: 50  Trunk/Postural Assessment  Cervical Assessment Cervical Assessment: (fwd head) Thoracic Assessment Thoracic Assessment: (rounded shoulders) Lumbar Assessment Lumbar Assessment: (posterior pelvic tilt) Postural Control Postural Control: Deficits on evaluation Righting Reactions: delayed and inadequate  Balance Balance Balance Assessed: Yes Dynamic Sitting Balance Sitting balance - Comments: min A due to Lt lean Static Standing Balance Static Standing - Comment/# of Minutes: max A Dynamic Standing Balance Dynamic Standing - Comments: total A Extremity Assessment      RLE  Assessment General Strength Comments: grossly 3+/5 LLE Assessment General Strength Comments: ankle DF 2+/5, knee flex 3-/5, knee ext 3-/5, hip flex 3-/5, hip ext 3-/5    Refer to Care Plan for Long Term Goals  Recommendations for other services: None   Discharge Criteria: Patient will be discharged from PT if patient refuses treatment 3 consecutive times without medical reason, if treatment goals not met, if  there is a change in medical status, if patient makes no progress towards goals or if patient is discharged from hospital.  The above assessment, treatment plan, treatment alternatives and goals were discussed and mutually agreed upon: by patient  [facility] 11/10/2018, 9:42 AM

## 2018-11-10 NOTE — Progress Notes (Signed)
Physical Therapy Session Note  Patient Details  Name: Andre Wolfe MRN: [id number] Date of Birth: [birth year]  Today's Date: 11/10/2018 PT Individual Time: 1427-1505 PT Individual Time Calculation (min): 38 min   Short Term Goals: Week 1:  PT Short Term Goal 1 (Week 1): pt will perform functional transfers with mod A PT Short Term Goal 2 (Week 1): pt will perform gait with mod A x 25' in controlled environment  Skilled Therapeutic Interventions/Progress Updates:    pt performs supine to sit with mod A.  Max A squat pivot transfer to w/c and to nustep during session.  Pt performs nustep x 8 minutes level 4 with focus on Lt LE/UE coordination and control.  Pt requires assist to prevent Lt LE ER due to inattention and decreased strength.  Pt left in w/c with all needs at hand, alarm set.  Therapy Documentation Precautions:  Precautions Precautions: Fall Restrictions Weight Bearing Restrictions: No Pain: Pain Assessment Pain Scale: 0-10 Pain Score: 0-No pain   Therapy/Group: Individual Therapy  Doryce Mcgregory 11/10/2018, 3:08 PM

## 2018-11-10 NOTE — Evaluation (Signed)
Speech Language Pathology Assessment and Plan  Patient Details  Name: Andre Wolfe MRN: [id number] Date of Birth: June 26, 1935  SLP Diagnosis: Dysarthria;Cognitive Impairments  Rehab Potential: Excellent ELOS: 3 weeks     Today's Date: 11/10/2018 SLP Individual Time: 6629-4765 SLP Individual Time Calculation (min): 55 min   Problem List:  Patient Active Problem List   Diagnosis Date Noted  . Bradycardia   . Right pontine stroke ([place]) 07/16/2018  . Acute ischemic stroke ([place])   . Essential hypertension   . Poorly controlled type 2 diabetes mellitus with peripheral neuropathy ([place])   . Tobacco abuse   . PAF (paroxysmal atrial fibrillation) ([place])   . Right pontine CVA ([place]) 07/13/2018  . Hypercholesterolemia 05/04/2018   Past Medical History:  Past Medical History:  Diagnosis Date  . Arthritis    "joints" (07/13/2018)  . Coronary artery disease    a. s/p CABG 12/2015.  Marland Kitchen CVA (cerebral vascular accident) ([place]) 07/13/2018   right pontine infarct; "said he's had one in the past; probably 2 yrs ago; same symptoms" (07/13/2018)  . Diabetic peripheral neuropathy ([place])   . GERD (gastroesophageal reflux disease)   . Hyperlipidemia   . Hypertension   . Postoperative atrial fibrillation ([place])   . Prostate cancer ([place])    d'x 2015/2016  . TIA (transient ischemic attack)   . Type II diabetes mellitus ([place])    Past Surgical History:  Past Surgical History:  Procedure Laterality Date  . CARDIAC CATHETERIZATION  2017   "not completed; went straight to [facility]"  . CATARACT EXTRACTION W/ INTRAOCULAR LENS  IMPLANT, BILATERAL Bilateral   . CORONARY ARTERY BYPASS GRAFT  2017   "CABG X 4"  . TONSILLECTOMY      Assessment / Plan / Recommendation Clinical Impression Patient is an 84 year old male with history of HTN, CAD, T2DM with peripheral neuropathy and gait disorder, PAF s/p DCCV, CVA 06/2018 with CIR stay and d/c to home at supervision level. He was readmitted on 11/05/18 with one day  history of Left facial droop with dysarthria and left facial weakness. He refused to come in due to COVID 19 but symptoms did not resolve therefore was brought in as code stroke. CTA head/neck was negative for LVO and showed stable widespread intracranial atherosclerosis with chronic severe L-PCA origin stenosis. Marland Kitchen MRI brain done revealing recurrent right pontine stroke and Dr. Erlinda Hong recommended adding ASA to Eliquis for stroke felt to be due to small vessel disease. Prozac added for reactive depression with anxiety. Patient with resultant left facial weakness with mild dysarthria and left sided weakness affecting mobility and ADLs. CIR recommended due to functional decline and patient admitted 11/09/18.  Patient administered the Cognistat and demonstrates mild impairments in reasoning and severe impairments in short-term recall. Patient scored [facility] for all other areas. Patient also demonstrates a mild dysarthria at the conversation level due to imprecise consonants and is ~90% intelligible. Patient would benefit from skilled SLP intervention to maximize his cognitive functioning and overall functional independence prior to discharge.      Skilled Therapeutic Interventions          Administered a cognitive-linguistic evaluation, please see above for details. Educated patient in regards to current cognitive deficits and goals of skilled SLP intervention, he verbalized understanding and agreement.    SLP Assessment  Patient will need skilled [place] Pathology Services during CIR admission    Recommendations  Recommendations for Other Services: Neuropsych consult Patient destination: Home Follow up Recommendations: 24 hour  supervision/assistance Equipment Recommended: None recommended by SLP    SLP Frequency 1 to 3 out of 7 days   SLP Duration  SLP Intensity  SLP Treatment/Interventions 3 weeks   Minumum of 1-2 x/day, 30 to 90 minutes  Cognitive remediation/compensation;Environmental  controls;Internal/external aids;Speech/Language facilitation;Therapeutic Activities;Patient/family education;Functional tasks;Cueing hierarchy    Pain No/Denies Pain   Short Term Goals: Week 1: SLP Short Term Goal 1 (Week 1): Patient will demonstrate functional problem solving for mildly complex but familair tasks with supervision verbal cues.  SLP Short Term Goal 2 (Week 1): Patient will recall new, daily information with Min A verbal cues for use of compensatory strateiges.  SLP Short Term Goal 3 (Week 1): Patient will utilize speech intelligibility strategies at the conversation level with Mod I to achieve 100% intelligibility.   Refer to Care Plan for Long Term Goals  Recommendations for other services: Neuropsych  Discharge Criteria: Patient will be discharged from SLP if patient refuses treatment 3 consecutive times without medical reason, if treatment goals not met, if there is a change in medical status, if patient makes no progress towards goals or if patient is discharged from hospital.  The above assessment, treatment plan, treatment alternatives and goals were discussed and mutually agreed upon: by patient  Ieesha Abbasi 11/10/2018, 4:00 PM

## 2018-11-10 NOTE — Progress Notes (Signed)
[facility] PHYSICAL MEDICINE & REHABILITATION PROGRESS NOTE   Subjective/Complaints:    Objective:   No results found. Recent Labs    11/10/18 0518  WBC 7.6  HGB 15.2  HCT 44.6  PLT 227   Recent Labs    11/10/18 0518  NA 139  K 3.9  CL 106  CO2 24  GLUCOSE 191*  BUN 30*  CREATININE 1.30*  CALCIUM 9.1    Intake/Output Summary (Last 24 hours) at 11/10/2018 0729 Last data filed at 11/10/2018 0645 Gross per 24 hour  Intake 200 ml  Output 675 ml  Net -475 ml     Physical Exam: Vital Signs Blood pressure 132/62, pulse 63, temperature 98.3 F (36.8 C), temperature source Oral, resp. rate 15, height 6' (1.829 m), weight 92.3 kg, SpO2 100 %.   General: No acute distress Mood and affect are appropriate Heart: Regular rate and rhythm no rubs murmurs or extra sounds Lungs: Clear to auscultation, breathing unlabored, no rales or wheezes Abdomen: Positive bowel sounds, soft nontender to palpation, nondistended Extremities: No clubbing, cyanosis, or edema Skin: No evidence of breakdown, no evidence of rash Neurologic: Cranial nerves II through XII intact, motor strength is 5/5 in RIght an d3- Left  deltoid, bicep, tricep, grip, hip flexor, knee extensors, ankle dorsiflexor and plantar flexor Sensory exam normal sensation to light touch and proprioception in bilateral upper and lower extremities Cerebellar exam normal finger to nose to finger as well as heel to shin in bilateral upper and lower extremities Musculoskeletal: Full range of motion in all 4 extremities. No joint swelling   Assessment/Plan: 1. Functional deficits secondary to recurrent R pon which require 3+ hours per day of interdisciplinary therapy in a comprehensive inpatient rehab setting.  Physiatrist is providing close team supervision and 24 hour management of active medical problems listed below.  Physiatrist and rehab team continue to assess barriers to discharge/monitor patient progress toward  functional and medical goals  Care Tool:  Bathing              Bathing assist       Upper Body Dressing/Undressing Upper body dressing        Upper body assist      Lower Body Dressing/Undressing Lower body dressing      What is the patient wearing?: Incontinence brief     Lower body assist Assist for lower body dressing: Minimal Assistance - Patient > 75%     Toileting Toileting    Toileting assist Assist for toileting: Moderate Assistance - Patient 50 - 74%     Transfers Chair/bed transfer  Transfers assist  Chair/bed transfer activity did not occur: Safety/medical concerns        Locomotion Ambulation   Ambulation assist              Walk 10 feet activity   Assist           Walk 50 feet activity   Assist           Walk 150 feet activity   Assist           Walk 10 feet on uneven surface  activity   Assist           Wheelchair     Assist               Wheelchair 50 feet with 2 turns activity    Assist            Wheelchair 150 feet  activity     Assist          Medical Problem List and Plan: 1.Functional deficits and left hemiparesissecondary to right pontine infarct CIR PT, OT evals 2. Antithrombotics: -DVT/anticoagulation:Pharmaceutical:Other (comment)--Eliquis. -antiplatelet therapy: ASA 81 mg 3. Pain Management:N/A 4. Mood:LCSW to follow for evaluation and support. -antipsychotic agents: N/A 5. Neuropsych: This patientiscapable of making decisions on hisown behalf. 6. Skin/Wound Care:routine pressure relief measures. 7. Fluids/Electrolytes/Nutrition:Monitor I/O. Check lytes in am.  8. Dyslipidemia: On Repatha at home. 9.HTN: Monitor BP bid with gradual normalization over 3-5 days. On low dose Norvasc and Metoprolol bid.  Vitals:   11/09/18 1959 11/10/18 0502  BP: (!) 143/73 132/62  Pulse: 68 63  Resp: 15 15  Temp:  98 F (36.7 C) 98.3 F (36.8 C)  SpO2: 95% 100%  10. Neurogenic bladder/Urinary retention: Will monitor voiding with PVR checks and resume I/O caths.  11. Reactive depression: Now on Prozac.  12. T2DM: Monitor BS ac/hs. Continue home regimen of 70/30 insulin 30U/10U/20U tid ac.   CBG (last 3)  Recent Labs    11/09/18 1757 11/09/18 2146 11/10/18 0638  GLUCAP 98 217* 148*  fair control cont to monitor  LOS: 1 days A FACE TO FACE EVALUATION WAS PERFORMED  Andre Wolfe 11/10/2018, 7:29 AM

## 2018-11-10 NOTE — Plan of Care (Signed)
  Problem: RH BOWEL ELIMINATION Goal: RH STG MANAGE BOWEL WITH ASSISTANCE Description STG Manage Bowel with Assistance. mod  Outcome: Progressing Flowsheets (Taken 11/10/2018 1327) STG: Pt will manage bowels with assistance: 4-Minimum assistance   Problem: RH BLADDER ELIMINATION Goal: RH STG MANAGE BLADDER WITH ASSISTANCE Description STG Manage Bladder With Assistance. Mod  Outcome: Progressing Flowsheets (Taken 11/10/2018 1327) STG: Pt will manage bladder with assistance: 1-Total assistance   Problem: RH SKIN INTEGRITY Goal: RH STG SKIN FREE OF INFECTION/BREAKDOWN Outcome: Progressing   Problem: RH SAFETY Goal: RH STG ADHERE TO SAFETY PRECAUTIONS W/ASSISTANCE/DEVICE Description STG Adhere to Safety Precautions With Assistance/Device. Min  Outcome: Progressing Flowsheets (Taken 11/10/2018 1327) STG:Pt will adhere to safety precautions with assistance/device: 4-Minimal assistance   Problem: RH KNOWLEDGE DEFICIT Goal: RH STG INCREASE KNOWLEDGE OF DIABETES Description Patient will be able to verbalize target blood sugar range, use of medications and diet to manage T2DM with cues/handouts  Outcome: Progressing Goal: RH STG INCREASE KNOWLEDGE OF HYPERTENSION Description Patient will be able to describe management of hypertension including diet and medication with cues/handouts  Outcome: Progressing

## 2018-11-10 NOTE — Care Management Note (Signed)
Inpatient [facility] Individual Statement of Services  Patient Name:  Andre Wolfe  Date:  11/10/2018  Welcome to the [facility].  Our goal is to provide you with an individualized program based on your diagnosis and situation, designed to meet your specific needs.  With this comprehensive rehabilitation program, you will be expected to participate in at least 3 hours of rehabilitation therapies Monday-Friday, with modified therapy programming on the weekends.  Your rehabilitation program will include the following services:  Physical Therapy (PT), Occupational Therapy (OT), Speech Therapy (ST), 24 hour per day rehabilitation nursing, Neuropsychology, Case Management (Social Worker), Rehabilitation Medicine, Nutrition Services and Pharmacy Services  Weekly team conferences will be held on Wednesday to discuss your progress.  Your Social Worker will talk with you frequently to get your input and to update you on team discussions.  Team conferences with you and your family in attendance may also be held.  Expected length of stay: 18-21 days  Overall anticipated outcome: CGA-OT goals and min assist with PT goals  Depending on your progress and recovery, your program may change. Your Social Worker will coordinate services and will keep you informed of any changes. Your Social Worker's name and contact numbers are listed  below.  The following services may also be recommended but are not provided by the [facility]:    [place] will be made to provide these services after discharge if needed.  Arrangements include referral to agencies that provide these services.  Your insurance has been verified to be:  Medicare Your primary doctor is:  Yaakov Guthrie  Pertinent information will be shared with your doctor and your insurance company.  Social Worker:  Ovidio Kin,  [place] or ([phone]  Information discussed with and copy given to patient by: Elease Hashimoto, 11/10/2018, 11:09 AM

## 2018-11-11 ENCOUNTER — Inpatient Hospital Stay (HOSPITAL_COMMUNITY): Payer: Medicare Other | Admitting: Physical Therapy

## 2018-11-11 ENCOUNTER — Inpatient Hospital Stay (HOSPITAL_COMMUNITY): Payer: Medicare Other | Admitting: Speech Pathology

## 2018-11-11 ENCOUNTER — Inpatient Hospital Stay (HOSPITAL_COMMUNITY): Payer: Medicare Other | Admitting: Occupational Therapy

## 2018-11-11 LAB — GLUCOSE, CAPILLARY
Glucose-Capillary: 129 mg/dL — ABNORMAL HIGH (ref 70–99)
Glucose-Capillary: 258 mg/dL — ABNORMAL HIGH (ref 70–99)
Glucose-Capillary: 173 mg/dL — ABNORMAL HIGH (ref 70–99)
Glucose-Capillary: 153 mg/dL — ABNORMAL HIGH (ref 70–99)

## 2018-11-11 MED ORDER — SODIUM CHLORIDE 0.45 % IV BOLUS: 500.0000 mL | Freq: Once | INTRAVENOUS | Status: DC

## 2018-11-11 NOTE — Progress Notes (Signed)
Speech Language Pathology Daily Session Note  Patient Details  Name: Andre Wolfe MRN: [id number] Date of Birth: 06-29-1935  Today's Date: 11/11/2018 SLP Individual Time: 1300-1345 SLP Individual Time Calculation (min): 45 min  Short Term Goals: Week 1: SLP Short Term Goal 1 (Week 1): Patient will demonstrate functional problem solving for mildly complex but familair tasks with supervision verbal cues.  SLP Short Term Goal 1 - Progress (Week 1): Progressing toward goal SLP Short Term Goal 2 (Week 1): Patient will recall new, daily information with Min A verbal cues for use of compensatory strateiges.  SLP Short Term Goal 2 - Progress (Week 1): Progressing toward goal SLP Short Term Goal 3 (Week 1): Patient will utilize speech intelligibility strategies at the conversation level with Mod I to achieve 100% intelligibility.   Skilled Therapeutic Interventions: Session focused on problem-solving and recall.  Pt able to recall list of home meds, needed names of new meds since hospitalization, and was able to recite frequency/time of day for administration for meds he has been taking chronically.  Acknowledged difficulty recalling names of newer medicines, asserting that his wife would "take care of it," but agreeing it would be beneficial if he took some responsibility in handling his medicines.  Needed initial verbal cues, but then able to interpret schedule independently.   Became emotional several times during session; this was appropriate, and related to his family unable to be present and his gratitude for his overall health.  Speech was improved from yesterday and fully intelligible.  Pt was left in wheelchair with access to call bell/phone.   Pain Pain Assessment Pain Scale: 0-10 Pain Score: 0-No pain  Therapy/Group: Individual Therapy  [name] 11/11/2018, 3:38 PM

## 2018-11-11 NOTE — Progress Notes (Signed)
Last night, patients pulse was <60bpm. Lopressor was held for bradycardia. This morning, it is again <60bpm. A communication was left for the physician for evaluation. No s/s of bradycardia noted, no c/o pain or discomfort. No acute distress noted. Will continue to monitor

## 2018-11-11 NOTE — Plan of Care (Signed)
  Problem: RH BOWEL ELIMINATION Goal: RH STG MANAGE BOWEL WITH ASSISTANCE Description STG Manage Bowel with Assistance. mod  Outcome: Progressing Flowsheets (Taken 11/11/2018 1653) STG: Pt will manage bowels with assistance: 4-Minimum assistance   Problem: RH BLADDER ELIMINATION Goal: RH STG MANAGE BLADDER WITH ASSISTANCE Description STG Manage Bladder With Assistance. Mod  Outcome: Progressing Flowsheets (Taken 11/11/2018 1653) STG: Pt will manage bladder with assistance: 1-Total assistance   Problem: RH SKIN INTEGRITY Goal: RH STG SKIN FREE OF INFECTION/BREAKDOWN Outcome: Progressing   Problem: RH SAFETY Goal: RH STG ADHERE TO SAFETY PRECAUTIONS W/ASSISTANCE/DEVICE Description STG Adhere to Safety Precautions With Assistance/Device. Min  Outcome: Progressing Flowsheets (Taken 11/11/2018 1653) STG:Pt will adhere to safety precautions with assistance/device: 3-Moderate assistance   Problem: RH KNOWLEDGE DEFICIT Goal: RH STG INCREASE KNOWLEDGE OF DIABETES Description Patient will be able to verbalize target blood sugar range, use of medications and diet to manage T2DM with cues/handouts  Outcome: Progressing Goal: RH STG INCREASE KNOWLEDGE OF HYPERTENSION Description Patient will be able to describe management of hypertension including diet and medication with cues/handouts  Outcome: Progressing Goal: RH STG INCREASE KNOWLEDGE OF STROKE PROPHYLAXIS Description Patient will be able to describe self care following stroke including prophylaxis medications and lifestyle modifications to reduce risk with cues/handouts  Outcome: Progressing

## 2018-11-11 NOTE — Progress Notes (Signed)
Social Work Patient ID: Andre Wolfe, male   DOB: 1934-10-01, 84 y.o.   MRN: [id number] Met with pt and spoke on speaker phone to daughter  wife top inform them of team conference goals supervision-min assist level and target discharge date 5/18. All in agreement and pt feels too long to be here, aware can move up date if he progresses more quickly. Wife is planning on hiring assist to help her with him, will scan a private duty list to daughter so they can start to pursue this.

## 2018-11-11 NOTE — Progress Notes (Signed)
Physical Therapy Session Note  Patient Details  Name: Andre Wolfe MRN: [id number] Date of Birth: August 21, 1934  Today's Date: 11/11/2018 PT Individual Time: 1103-1200 PT Individual Time Calculation (min): 57 min   Short Term Goals: Week 1:  PT Short Term Goal 1 (Week 1): pt will perform functional transfers with mod A PT Short Term Goal 2 (Week 1): pt will perform gait with mod A x 25' in controlled environment  Skilled Therapeutic Interventions/Progress Updates:    Patient received up in chair, pleasant and willing to participate in session. Required MaxA for sit to stand from [facility] in hallway, strong posterior lean and poor sequencing with B LEs buckling, only able to take 1-2 steps before having posterior LOB and requiring MaxA lower to chair. Transported him to railing outside of PT gym where he was able to gait train 55f x2, MaxA and Max cues on first attempt, MinA and Max cues on second attempt with visual feedback via mirror; then tried gait with RW in hallway along with mirror, patient able to gait train an additional 288fwith RW and MinA. Focused rest of session on sequencing for sit to stand with MaxA and Max cues, difficulty with consistent sequencing and cognitive processing especially as fatigue increased, difficulty with anterior translation of head over hips. Able to pull self up to standing with min guard at parallel bars and cues for anterior weight shift. Worked on standing balance at parallel bars with Max cues and visual/verbal feedback due to poor posture and poor self-corrections. He was left up in his WC with all needs met, rehab tech present and applying seat belt alarm.   Therapy Documentation Precautions:  Precautions Precautions: Fall Restrictions Weight Bearing Restrictions: No   Pain: Pain Assessment Pain Scale: 0-10 Pain Score: 0-No pain    Therapy/Group: Individual Therapy   KrDeniece ReeT, DPT, CBIS  Supplemental Physical Therapist [facility]   Pager [phone] Office [phone]  11/11/2018, 1:15 PM

## 2018-11-11 NOTE — Progress Notes (Signed)
[place] PHYSICAL MEDICINE & REHABILITATION PROGRESS NOTE   Subjective/Complaints:  No problems overnite , therapy was tiring but no other concerns.  Drinks fair (754ml)  but poor meal intake, does not like the food  ROS- neg CP, SOB, neg N/V/D Objective:   No results found. Recent Labs    11/10/18 0518  WBC 7.6  HGB 15.2  HCT 44.6  PLT 227   Recent Labs    11/10/18 0518  NA 139  K 3.9  CL 106  CO2 24  GLUCOSE 191*  BUN 30*  CREATININE 1.30*  CALCIUM 9.1    Intake/Output Summary (Last 24 hours) at 11/11/2018 0736 Last data filed at 11/11/2018 0328 Gross per 24 hour  Intake 720 ml  Output 900 ml  Net -180 ml     Physical Exam: Vital Signs Blood pressure (!) 153/64, pulse (!) 55, temperature 97.7 F (36.5 C), temperature source Oral, resp. rate 16, height 6' (1.829 m), weight 92.3 kg, SpO2 99 %.   General: No acute distress Mood and affect are appropriate Heart: Regular rate and rhythm no rubs murmurs or extra sounds Lungs: Clear to auscultation, breathing unlabored, no rales or wheezes Abdomen: Positive bowel sounds, soft nontender to palpation, nondistended Extremities: No clubbing, cyanosis, or edema Skin: No evidence of breakdown, no evidence of rash Neurologic: Cranial nerves II through XII intact, motor strength is 5/5 in RIght an d3- Left  deltoid, bicep, tricep, grip, hip flexor, knee extensors, ankle dorsiflexor and plantar flexor Sensory exam normal sensation to light touch and proprioception in bilateral upper and lower extremities Cerebellar exam normal finger to nose to finger as well as heel to shin in bilateral upper and lower extremities Musculoskeletal: Full range of motion in all 4 extremities. No joint swelling   Assessment/Plan: 1. Functional deficits secondary to recurrent R pon which require 3+ hours per day of interdisciplinary therapy in a comprehensive inpatient rehab setting.  Physiatrist is providing close team supervision and 24  hour management of active medical problems listed below.  Physiatrist and rehab team continue to assess barriers to discharge/monitor patient progress toward functional and medical goals  Care Tool:  Bathing  Bathing activity did not occur: Refused           Bathing assist       Upper Body Dressing/Undressing Upper body dressing   What is the patient wearing?: Pull over shirt    Upper body assist Assist Level: Contact Guard/Touching assist    Lower Body Dressing/Undressing Lower body dressing      What is the patient wearing?: Incontinence brief     Lower body assist Assist for lower body dressing: Minimal Assistance - Patient > 75%     Toileting Toileting    Toileting assist Assist for toileting: Minimal Assistance - Patient > 75%     Transfers Chair/bed transfer  Transfers assist  Chair/bed transfer activity did not occur: Safety/medical concerns  Chair/bed transfer assist level: Maximal Assistance - Patient 25 - 49%     Locomotion Ambulation   Ambulation assist      Assist level: 2 helpers Assistive device: Walker-rolling Max distance: 15   Walk 10 feet activity   Assist     Assist level: 2 helpers     Walk 50 feet activity   Assist Walk 50 feet with 2 turns activity did not occur: Safety/medical concerns         Walk 150 feet activity   Assist Walk 150 feet activity did not occur:  Safety/medical concerns         Walk 10 feet on uneven surface  activity   Assist Walk 10 feet on uneven surfaces activity did not occur: Safety/medical concerns         Wheelchair     Assist Will patient use wheelchair at discharge?: Yes Type of Wheelchair: Manual    Wheelchair assist level: Supervision/Verbal cueing Max wheelchair distance: 50    Wheelchair 50 feet with 2 turns activity    Assist        Assist Level: Supervision/Verbal cueing   Wheelchair 150 feet activity     Assist          Medical  Problem List and Plan: 1.Functional deficits and left hemiparesissecondary to right pontine infarct CIR PT, OT evals 2. Antithrombotics: -DVT/anticoagulation:Pharmaceutical:Other (comment)--Eliquis. -antiplatelet therapy: ASA 81 mg 3. Pain Management:N/A 4. Mood:LCSW to follow for evaluation and support. -antipsychotic agents: N/A 5. Neuropsych: This patientiscapable of making decisions on hisown behalf. 6. Skin/Wound Care:routine pressure relief measures. 7. Fluids/Electrolytes/Nutrition:Monitor I/O. Elevated BUN/Creat will give fluid bolus, EJ fx ok 8. Dyslipidemia: On Repatha at home. 9.HTN: Monitor BP bid with gradual normalization over 3-5 days. On low dose Norvasc and Metoprolol bid.  Mild elevation of systolic in permissive range Vitals:   11/10/18 1926 11/11/18 0330  BP: (!) 157/69 (!) 153/64  Pulse: (!) 58 (!) 55  Resp: 16 16  Temp: 98 F (36.7 C) 97.7 F (36.5 C)  SpO2: 100% 99%  10. Neurogenic bladder/Urinary retention: Will monitor voiding with PVR checks and resume I/O caths.  11. Reactive depression: Now on Prozac.  12. T2DM: Monitor BS ac/hs. Continue home regimen of 70/30 insulin 30U/10U/20U tid ac.   CBG (last 3)  Recent Labs    11/10/18 1638 11/10/18 2104 11/11/18 0621  GLUCAP 207* 127* 129*  fair to good control cont to monitor  LOS: 2 days A FACE TO FACE EVALUATION WAS PERFORMED  Charlett Blake 11/11/2018, 7:36 AM

## 2018-11-11 NOTE — Patient Care Conference (Signed)
Inpatient RehabilitationTeam Conference and Plan of Care Update Date: 11/11/2018   Time: 10:55 AM    Patient Name: Andre Wolfe      Medical Record Number: [id number]  Date of Birth: Nov 01, 1934 Sex: Male         Room/Bed: 4M09C/4M09C-01 Payor Info: Payor: MEDICARE / Plan: MEDICARE PART A AND B / Product Type: *No Product type* /    Admitting Diagnosis: r cva  Admit Date/Time:  11/09/2018  4:24 PM Admission Comments: No comment available   Primary Diagnosis:  <principal problem not specified> Principal Problem: <principal problem not specified>  Patient Active Problem List   Diagnosis Date Noted  . Bradycardia   . Right pontine stroke ([place]) 07/16/2018  . Acute ischemic stroke ([place])   . Essential hypertension   . Poorly controlled type 2 diabetes mellitus with peripheral neuropathy ([place])   . Tobacco abuse   . PAF (paroxysmal atrial fibrillation) ([place])   . Right pontine CVA ([place]) 07/13/2018  . Hypercholesterolemia 05/04/2018    Expected Discharge Date: Expected Discharge Date: 11/30/18  Team Members Present: Physician leading conference: Dr. Alysia Penna Social Worker Present: Ovidio Kin, LCSW Nurse Present: Blair Heys, RN PT Present: Phylliss Bob, PTA;Barrie Folk, PT OT Present: Darleen Crocker, OT SLP Present: Stormy Fabian, SLP PPS Coordinator present : Gunnar Fusi     Current Status/Progress Goal Weekly Team Focus  Medical   Hyperglycemia at times, systolic BP elevation, poor po fluid intake, chronic self cath  optimize BP and DM management  initiate rehab program   Bowel/Bladder   incontinenet at times of bowel, in/out cath Q4-6hrs or  PVR >350, was cathed tonight, LBM 11/10/18  regain bladder function & continence  continue to cath as needed   Swallow/Nutrition/ Hydration             ADL's   mod-max assist overall  Supervision overall, CGA for dynamic standing balance during self-care tasks and shower transfer  ADL retraining LUE NMR, dynamic standing  balance   Mobility   max A transfers, +2 gait with RW  supervision transfers, min A gait  transfers, gait, balance   Communication   mild dysarthria  Mod I  use of speech intelligibility strategy   Safety/Cognition/ Behavioral Observations  Mild higher level cognitive deficits  Supervision  completion of evaluation   Pain   no c/o pain, has tylenol prn  Piab scale <2/10  assess & treat as needed   Skin   no skin issues  no new skin break down while on rehab  assess q shift      *See Care Plan and progress notes for long and short-term goals.     Barriers to Discharge  Current Status/Progress Possible Resolutions Date Resolved   Physician    Medical stability     initial evals in progress  receiving IV bolus, cont PT, OT      Nursing                  PT  Decreased caregiver support                 OT                  SLP                SW                Discharge Planning/Teaching Needs:  Home with wife who can provide assist and has in  the past when he was on CIR before. Daughter to assist when she can also.      Team Discussion:  Goals set for supervision-min assist level. Wife has provided care after last stroke. MD watching labs and giving him IV fluids encouraging to drink. Was self cathing at home prior to admission. Adjusting BP meds. Currently max assist level.  Revisions to Treatment Plan:  DC 5/18    Continued Need for Acute Rehabilitation Level of Care: The patient requires daily medical management by a physician with specialized training in physical medicine and rehabilitation for the following conditions: Daily direction of a multidisciplinary physical rehabilitation program to ensure safe treatment while eliciting the highest outcome that is of practical value to the patient.: Yes Daily medical management of patient stability for increased activity during participation in an intensive rehabilitation regime.: Yes Daily analysis of laboratory values and/or  radiology reports with any subsequent need for medication adjustment of medical intervention for : Neurological problems;Renal problems;Nutritional problems   I attest that I was present, lead the team conference, and concur with the assessment and plan of the team.   Elease Hashimoto 11/11/2018, 1:02 PM

## 2018-11-11 NOTE — Progress Notes (Signed)
Occupational Therapy Session Note  Patient Details  Name: Andre Wolfe MRN: [id number] Date of Birth: 07-16-1934  Today's Date: 11/11/2018 OT Individual Time: 1500-1530 OT Individual Time Calculation (min): 30 min    Short Term Goals: Week 1:  OT Short Term Goal 1 (Week 1): Pt will complete bathing with mod assist at sit > stand level OT Short Term Goal 2 (Week 1): Pt will complete UB dressing with min assist OT Short Term Goal 3 (Week 1): Pt will complete LB dressing (except footwear) with mod assist at sit > stand level OT Short Term Goal 4 (Week 1): Pt will complete toilet transfer with mod assist of one caregiver  Skilled Therapeutic Interventions/Progress Updates:    Treatment session with focus on LUE NMR.  Pt received upright in w/c agreeable to therapy.  Engaged in [place] in sitting with focus on shoulder flexion and protraction during reaching activity.  Therapist facilitating functional ROM during reaching.  Provided pt with Self-ROM handout with focus on shoulder flexion/extension, internal/external rotation, and abduction/adduction.  Therapist demonstrated each exercise and provided min cues for proper technique to increase proper mechanics and facilitation of each movement.  Pt remained upright in w/c with all needs in reach and seat belt alarm on.  Therapy Documentation Precautions:  Precautions Precautions: Fall Restrictions Weight Bearing Restrictions: No General:   Vital Signs: Therapy Vitals Temp: 98.4 F (36.9 C) Temp Source: Oral Pulse Rate: (!) 58 Resp: 17 BP: (!) 151/68 Patient Position (if appropriate): Sitting Oxygen Therapy SpO2: 98 % O2 Device: Room Air Pain: Pain Assessment Pain Scale: 0-10 Pain Score: 0-No pain   Therapy/Group: Individual Therapy  [name] 11/11/2018, 3:42 PM

## 2018-11-11 NOTE — Progress Notes (Signed)
Occupational Therapy Session Note  Patient Details  Name: Andre Wolfe MRN: [id number] Date of Birth: Jan 07, 1935  Today's Date: 11/11/2018 OT Individual Time: 8675-4492 OT Individual Time Calculation (min): 60 min    Short Term Goals: Week 1:  OT Short Term Goal 1 (Week 1): Pt will complete bathing with mod assist at sit > stand level OT Short Term Goal 2 (Week 1): Pt will complete UB dressing with min assist OT Short Term Goal 3 (Week 1): Pt will complete LB dressing (except footwear) with mod assist at sit > stand level OT Short Term Goal 4 (Week 1): Pt will complete toilet transfer with mod assist of one caregiver  Skilled Therapeutic Interventions/Progress Updates:    Treatment session with focus on ADL retraining, transfers, and LUE NMR.  Pt received semi-reclined in bed with RN present administering medications.  Pt declined bathing at this time, but agreeable to dressing.  Pt initially attempted LB dressing at bed level with inability to reach towards feet or position self well.  Therapist encouraged pt to complete LB dressing seated at EOB.  Mod-max assist for sitting balance as pt with extreme posterior lean in unsupported sitting.  Therapist provided mod cues for hemi technique and sequencing.  Engaged in sit > stand to pull pants over hips, pt required multiple attempts due to posterior lean however able to complete with max assist.  Michaelyn Barter for transfer this session due to decreased sequencing and motor planning during transfer.  Engaged in grooming task seated at sink with cues to incorporate use of LUE as gross assist during opening of items.  Engaged in [place] in sitting with focus on reaching and grasping.  Increased challenge to incorporate reaching in horizontal abduction, therapist provided tactile cues/faciltation at elbow during reaching.  Returned to room and left upright in w/c with seat belt alarm on and all needs in reach.  Therapy Documentation Precautions:   Precautions Precautions: Fall Restrictions Weight Bearing Restrictions: No General:   Vital Signs: Therapy Vitals Pulse Rate: (!) 58 Pain:  Pt with no c/o pain  Therapy/Group: Individual Therapy  [name] 11/11/2018, 11:40 AM

## 2018-11-12 ENCOUNTER — Inpatient Hospital Stay (HOSPITAL_COMMUNITY): Payer: Medicare Other

## 2018-11-12 ENCOUNTER — Inpatient Hospital Stay (HOSPITAL_COMMUNITY): Payer: Medicare Other | Admitting: Speech Pathology

## 2018-11-12 ENCOUNTER — Inpatient Hospital Stay (HOSPITAL_COMMUNITY): Payer: Medicare Other | Admitting: Physical Therapy

## 2018-11-12 ENCOUNTER — Inpatient Hospital Stay (HOSPITAL_COMMUNITY): Payer: Medicare Other | Admitting: Occupational Therapy

## 2018-11-12 LAB — GLUCOSE, CAPILLARY
Glucose-Capillary: 125 mg/dL — ABNORMAL HIGH (ref 70–99)
Glucose-Capillary: 197 mg/dL — ABNORMAL HIGH (ref 70–99)
Glucose-Capillary: 72 mg/dL (ref 70–99)
Glucose-Capillary: 131 mg/dL — ABNORMAL HIGH (ref 70–99)

## 2018-11-12 NOTE — Progress Notes (Signed)
Occupational Therapy Session Note  Patient Details  Name: Andre Wolfe MRN: [id number] Date of Birth: 1935-07-09  Today's Date: 11/12/2018 OT Individual Time: 0258-5277 OT Individual Time Calculation (min): 60 min    Short Term Goals: Week 1:  OT Short Term Goal 1 (Week 1): Pt will complete bathing with mod assist at sit > stand level OT Short Term Goal 2 (Week 1): Pt will complete UB dressing with min assist OT Short Term Goal 3 (Week 1): Pt will complete LB dressing (except footwear) with mod assist at sit > stand level OT Short Term Goal 4 (Week 1): Pt will complete toilet transfer with mod assist of one caregiver  Skilled Therapeutic Interventions/Progress Updates:    Treatment session with focus on ADL retraining, functional transfers, and dynamic sitting balance.  Pt received supine in bed declining bathing, stating nurse tech washed him "real good".  Engaged in rolling at bed level with mod assist to don new incontinence brief due to poor fit.  Completed bed mobility with mod assist to come to sitting at EOB.  Utilized [place] for transfer to w/c due to decreased weight shifting this session.  Pt able to pull up to standing in [place] with min assist when pulling up on [place] bar.  Completed LB dressing from w/c due to posterior lean in unsupported sitting with pt demonstrating increased ability to thread pant legs with increased support in sitting. Grooming tasks completed in sitting with pt able to utilize LUE as gross assist when opening containers and applying toothpaste.  Pt reports need to toilet.  Utilized [place] for transfer to toilet.  Max assist for clothing and standing balance while pt attempted hygiene.  Pt left upright in w/c with seat belt alarm on and all needs in reach.  Therapy Documentation Precautions:  Precautions Precautions: Fall Restrictions Weight Bearing Restrictions: No Pain:  Pt with no c/o pain   Therapy/Group: Individual Therapy  [name] 11/12/2018,  11:43 AM

## 2018-11-12 NOTE — Progress Notes (Addendum)
Physical Therapy Session Note  Patient Details  Name: Andre Wolfe MRN: [id number] Date of Birth: [birth year]  Today's Date: 11/12/2018 PT Individual Time: 1030-1115 PT Individual Time Calculation (min): 45 min   Short Term Goals: Week 1:  PT Short Term Goal 1 (Week 1): pt will perform functional transfers with mod A PT Short Term Goal 2 (Week 1): pt will perform gait with mod A x 25' in controlled environment  Skilled Therapeutic Interventions/Progress Updates:     Patient in w/c upon PT arrival. Patient alert and agreeable to PT session.  Therapeutic Activity: Transfers: Patient performed sit to/from stand x2 with mod A with RW prior to gait activities and x4 with mod-min A without AD with emphasis on leaning forward to stand. Provided verbal cues and demonstration for leaning forward to clear his buttocks prior to standing to assist with center of mass of over his feet.  Gait Training:  Patient ambulated 15 feet x2 using RW with max-mod A with a w/c follow from a second person. Ambulated with decreased gait speed, step-to gait pattern leading with L, B increased hip and knee flexion with intermittent buckling on L, forward trunk lean, and downward head gaze. Provided verbal cues for erect posture, knee extension in stance on L, sequencing and pushing down on RW for increased UE support. PT lowered RW to promote UE use.   Wheelchair Mobility:  Patient propelled wheelchair 50 feet with supervision. Provided verbal cues for use of L UE and avoiding obstacles on the L.  Patient in w/c at end of session with breaks locked, seat belt alarm set, and all needs within reach. Patient expressed that he felt discouraged about his progress with PT. PT reassured and encouraged patient about his current progress and educated on stroke recovery throughout session, patient seemed in better spirits after.    Therapy Documentation Precautions:  Precautions Precautions: Fall Restrictions Weight Bearing  Restrictions: No   Pain:  Patient denied pain throughout session.    Therapy/Group: Individual Therapy  Joaquin Knebel L Delwyn Scoggin PT, DPT  11/12/2018, 12:39 PM

## 2018-11-12 NOTE — Progress Notes (Signed)
Physical Therapy Session Note  Patient Details  Name: Andre Wolfe MRN: [id number] Date of Birth: Nov 20, 1934  Today's Date: 11/12/2018 PT Individual Time: 1310-1423 PT Individual Time Calculation (min): 73 min   Short Term Goals: Week 1:  PT Short Term Goal 1 (Week 1): pt will perform functional transfers with mod A PT Short Term Goal 2 (Week 1): pt will perform gait with mod A x 25' in controlled environment  Skilled Therapeutic Interventions/Progress Updates:   pt performs bed mobility with min A, squat pivot transfer to Rt with max A, +2 assist for squat pivot transfers to Lt, mod cuing for UE placement, no carryover during session. Standing balance with reaching, tapping fwd and sideways all with +2 assist for balance, focus on Lt LE stance control, upright posture and Lt LE activation with tactile cues.  Gait with eva walker 2 x 25' with +2 assist, max A for Lt LE placement and to prevent buckling. Pt with little awareness for Lt LE placement and decreased/inadequate balance reactions, significant Lt lean during gait.  Pt performs nustep x 6 minutes for UE/LE strength and coordination.  Pt left in w/c with alarm set, needs at hand.   Therapy Documentation Precautions:  Precautions Precautions: Fall Restrictions Weight Bearing Restrictions: No Pain:  no c/o pain   Therapy/Group: Individual Therapy  Gopal Malter 11/12/2018, 2:29 PM

## 2018-11-12 NOTE — Progress Notes (Signed)
[facility] PHYSICAL MEDICINE & REHABILITATION PROGRESS NOTE   Subjective/Complaints:  Discussed d/c , pt wishes to leave sooner than 5/18, no other c/os  ROS- neg CP, SOB, neg N/V/D Objective:   No results found. Recent Labs    11/10/18 0518  WBC 7.6  HGB 15.2  HCT 44.6  PLT 227   Recent Labs    11/10/18 0518  NA 139  K 3.9  CL 106  CO2 24  GLUCOSE 191*  BUN 30*  CREATININE 1.30*  CALCIUM 9.1    Intake/Output Summary (Last 24 hours) at 11/12/2018 0750 Last data filed at 11/11/2018 2300 Gross per 24 hour  Intake 480 ml  Output 950 ml  Net -470 ml     Physical Exam: Vital Signs Blood pressure (!) 161/71, pulse (!) 55, temperature 97.7 F (36.5 C), temperature source Oral, resp. rate 16, height 6' (1.829 m), weight 92.3 kg, SpO2 97 %.   General: No acute distress Mood and affect are appropriate Heart: Regular rate and rhythm no rubs murmurs or extra sounds Lungs: Clear to auscultation, breathing unlabored, no rales or wheezes Abdomen: Positive bowel sounds, soft nontender to palpation, nondistended Extremities: No clubbing, cyanosis, or edema Skin: No evidence of breakdown, no evidence of rash Neurologic: Cranial nerves II through XII intact, motor strength is 5/5 in RIght and 3- Left  deltoid, bicep, tricep, grip, hip flexor, knee extensors, ankle dorsiflexor and plantar flexor Sensory exam normal sensation to light touch and proprioception in bilateral upper and lower extremities Cerebellar exam normal finger to nose to finger as well as heel to shin in bilateral upper and lower extremities Musculoskeletal: Full range of motion in all 4 extremities. No joint swelling   Assessment/Plan: 1. Functional deficits secondary to recurrent R pon which require 3+ hours per day of interdisciplinary therapy in a comprehensive inpatient rehab setting.  Physiatrist is providing close team supervision and 24 hour management of active medical problems listed  below.  Physiatrist and rehab team continue to assess barriers to discharge/monitor patient progress toward functional and medical goals  Care Tool:  Bathing  Bathing activity did not occur: Refused           Bathing assist       Upper Body Dressing/Undressing Upper body dressing   What is the patient wearing?: Pull over shirt    Upper body assist Assist Level: Maximal Assistance - Patient 25 - 49%    Lower Body Dressing/Undressing Lower body dressing      What is the patient wearing?: Pants     Lower body assist Assist for lower body dressing: Maximal Assistance - Patient 25 - 49%     Toileting Toileting    Toileting assist Assist for toileting: Minimal Assistance - Patient > 75%     Transfers Chair/bed transfer  Transfers assist  Chair/bed transfer activity did not occur: Safety/medical concerns  Chair/bed transfer assist level: Dependent - mechanical lift(with stedy)     Locomotion Ambulation   Ambulation assist      Assist level: Maximal Assistance - Patient 25 - 49% Assistive device: Other (comment)(rail in hallway ) Max distance: 31ft    Walk 10 feet activity   Assist     Assist level: Maximal Assistance - Patient 25 - 49% Assistive device: Other (comment)(rail in hallway )   Walk 50 feet activity   Assist Walk 50 feet with 2 turns activity did not occur: Safety/medical concerns         Walk 150 feet activity  Assist Walk 150 feet activity did not occur: Safety/medical concerns         Walk 10 feet on uneven surface  activity   Assist Walk 10 feet on uneven surfaces activity did not occur: Safety/medical concerns         Wheelchair     Assist Will patient use wheelchair at discharge?: Yes Type of Wheelchair: Manual    Wheelchair assist level: Supervision/Verbal cueing Max wheelchair distance: 130ft     Wheelchair 50 feet with 2 turns activity    Assist        Assist Level: Supervision/Verbal  cueing   Wheelchair 150 feet activity     Assist          Medical Problem List and Plan: 1.Functional deficits and left hemiparesissecondary to right pontine infarct 11/05/2018 CIR PT, OT discussed d/c of 5/18 2. Antithrombotics: -DVT/anticoagulation:Pharmaceutical:Other (comment)--Eliquis. -antiplatelet therapy: ASA 81 mg 3. Pain Management:N/A 4. Mood:LCSW to follow for evaluation and support. -antipsychotic agents: N/A 5. Neuropsych: This patientiscapable of making decisions on hisown behalf. 6. Skin/Wound Care:routine pressure relief measures. 7. Fluids/Electrolytes/Nutrition:Monitor I/O. Elevated BUN/Creat will give fluid bolus, EJ fx ok 8. Dyslipidemia: On Repatha at home. 9.HTN: Monitor BP bid with gradual normalization over 3-5 days. On low dose Norvasc and Metoprolol bid.  Mild elevation of systolic in permissive range, will start to tighten control next week Vitals:   11/11/18 1900 11/12/18 0457  BP: (!) 158/65 (!) 161/71  Pulse: 65 (!) 55  Resp: 15 16  Temp: 99 F (37.2 C) 97.7 F (36.5 C)  SpO2: 98% 97%  10. Neurogenic bladder/Urinary retention: Will monitor voiding with PVR checks and resume I/O caths.  11. Reactive depression: Now on Prozac.  12. T2DM: Monitor BS ac/hs. Continue home regimen of 70/30 insulin 30U/10U/20U tid ac.   CBG (last 3)  Recent Labs    11/11/18 1641 11/11/18 2107 11/12/18 0620  GLUCAP 173* 153* 125*  fair to good control cont to monitor on Novalog 70/30  LOS: 3 days A FACE TO FACE EVALUATION WAS PERFORMED  Charlett Blake 11/12/2018, 7:50 AM

## 2018-11-12 NOTE — Progress Notes (Signed)
Speech Language Pathology Daily Session Note  Patient Details  Name: Andre Wolfe MRN: [id number] Date of Birth: 10-16-1934  Today's Date: 11/12/2018 SLP Individual Time: 3474-2595 SLP Individual Time Calculation (min): 40 min  Short Term Goals: Week 1: SLP Short Term Goal 1 (Week 1): Patient will demonstrate functional problem solving for mildly complex but familair tasks with supervision verbal cues.  SLP Short Term Goal 1 - Progress (Week 1): Progressing toward goal SLP Short Term Goal 2 (Week 1): Patient will recall new, daily information with Min A verbal cues for use of compensatory strateiges.  SLP Short Term Goal 2 - Progress (Week 1): Progressing toward goal SLP Short Term Goal 3 (Week 1): Patient will utilize speech intelligibility strategies at the conversation level with Mod I to achieve 100% intelligibility.   Skilled Therapeutic Interventions: Skilled treatment session focused on cognitive goals. SLP facilitated session by providing supervision verbal cues for recall of his current medications their functions. Patient's family organizes his pill box at home and manages his finances. SLP facilitated discussion about importance of maintaining as functionally independent as possible to maximize and maintain cognitive functioning. Patient verbalized understanding. Patient left upright in wheelchair with alarm on and all needs within reach. Continue with current plan of care.      Pain No/Denies Pain  Therapy/Group: Individual Therapy  Shanyia Stines 11/12/2018, 12:48 PM

## 2018-11-12 NOTE — IPOC Note (Signed)
Overall Plan of Care [facility]) Patient Details Name: Andre Wolfe MRN: [id number] DOB: Mar 03, 1935  Admitting Diagnosis: <principal problem not specified>  Hospital Problems: Active Problems:   Right pontine stroke [facility])     Functional Problem List: Nursing Behavior, Bladder, Bowel, Endurance, Medication Management, Nutrition, Perception, Safety, Skin Integrity  PT Balance, Endurance, Motor, Safety, Sensory  OT Balance, Cognition, Endurance, Motor, Pain, Perception, Safety, Sensory, Skin Integrity  SLP Cognition, Linguistic  TR         Basic ADL's: OT Grooming, Bathing, Dressing, Toileting     Advanced  ADL's: OT       Transfers: PT Bed Mobility, Bed to Chair, Car, Manufacturing systems engineer, Metallurgist: PT Stairs, Emergency planning/management officer, Ambulation     Additional Impairments: OT Fuctional Use of Upper Extremity  SLP Communication, Social Cognition expression Problem Solving, Memory  TR      Anticipated Outcomes Item Anticipated Outcome  Self Feeding Supervision/setup  Swallowing      Basic self-care  Supervision  Toileting  Supervision   Bathroom Transfers Supervision  Bowel/Bladder  regain continence of bowel and bladder  Transfers  supervision  Locomotion  min A gait  Communication  Mod I   Cognition  Mod I-Supervision   Pain  remain free of pain  Safety/Judgment  remain free of falls, skin breakdown and infection   Therapy Plan: PT Intensity: Minimum of 1-2 x/day ,45 to 90 minutes PT Frequency: 5 out of 7 days PT Duration Estimated Length of Stay: 18-21 days OT Intensity: Minimum of 1-2 x/day, 45 to 90 minutes OT Frequency: 5 out of 7 days OT Duration/Estimated Length of Stay: 3 weeks SLP Intensity: Minumum of 1-2 x/day, 30 to 90 minutes SLP Frequency: 1 to 3 out of 7 days SLP Duration/Estimated Length of Stay: 3 weeks    Due to the current state of emergency, patients may not be receiving their 3-hours of Medicare-mandated  therapy.   Team Interventions: Nursing Interventions Patient/Family Education, Skin Care/Wound Management, Psychosocial Support, Bladder Management, Discharge Planning  PT interventions Ambulation/gait training, Community reintegration, DME/adaptive equipment instruction, Neuromuscular re-education, Stair training, UE/LE Strength taining/ROM, Wheelchair propulsion/positioning, Training and development officer, Discharge planning, Functional electrical stimulation, Pain management, Therapeutic Activities, UE/LE Coordination activities, Therapeutic Exercise, Splinting/orthotics, Patient/family education, Functional mobility training, Cognitive remediation/compensation  OT Interventions Training and development officer, Cognitive remediation/compensation, Community reintegration, Discharge planning, Disease mangement/prevention, DME/adaptive equipment instruction, Functional electrical stimulation, Functional mobility training, Neuromuscular re-education, Pain management, Patient/family education, Psychosocial support, Self Care/advanced ADL retraining, Skin care/wound managment, Splinting/orthotics, Therapeutic Activities, Therapeutic Exercise, UE/LE Strength taining/ROM, UE/LE Coordination activities, Wheelchair propulsion/positioning  SLP Interventions Cognitive remediation/compensation, Environmental controls, Internal/external aids, Speech/Language facilitation, Therapeutic Activities, Patient/family education, Functional tasks, Cueing hierarchy  TR Interventions    SW/CM Interventions Discharge Planning, Psychosocial Support, Patient/Family Education   Barriers to Discharge MD  Medical stability  Nursing      PT Decreased caregiver support    OT      SLP      SW       Team Discharge Planning: Destination: PT-  ,OT- Home , SLP-Home Projected Follow-up: PT- , OT-  Home health OT, 24 hour supervision/assistance, SLP-24 hour supervision/assistance Projected Equipment Needs: PT- , OT- To be determined,  SLP-None recommended by SLP Equipment Details: PT- , OT-  Patient/family involved in discharge planning: PT-  ,  OT-Patient, SLP-Patient  MD ELOS: 18-21d Medical Rehab Prognosis:  Good Assessment:  84 year old male with history of HTN, CAD, T2DM with peripheral neuropathy  and gait disorder, PAF s/p DCCV, CVA 06/2018 with CIR stay and d/c to home at supervision level. He was readmitted on 11/05/18 with one day history of Left facial droop with dysarthria and left facial weakness. He refused to come in due to COVID 19 but symptoms did not resolve therefore was brought in as code stroke. CTA head/neck was negative for LVO and showed stable widespread intracranial atherosclerosis with chronic severe L-PCA origin stenosis. [facility] Kitchen MRI brain done revealing recurrent right pontine stroke and Dr. Erlinda Hong recommended adding ASA to Eliquis for stroke felt to be due to small vessel disease. Prozac added for reactive depression with anxiety. Patient with resultant left facial weakness with mild dysarthria and left sided weakness affecting mobility and ADLs   Now requiring 24/7 Rehab RN,MD, as well as CIR level PT, OT and SLP.  Treatment team will focus on ADLs and mobility with goals set at Supervision See Team Conference Notes for weekly updates to the plan of care

## 2018-11-12 NOTE — Plan of Care (Signed)
  Problem: RH BOWEL ELIMINATION Goal: RH STG MANAGE BOWEL WITH ASSISTANCE Description STG Manage Bowel with Assistance. mod  Outcome: Progressing   Problem: RH BLADDER ELIMINATION Goal: RH STG MANAGE BLADDER WITH ASSISTANCE Description STG Manage Bladder With Assistance. Mod  Outcome: Progressing   Problem: RH SKIN INTEGRITY Goal: RH STG SKIN FREE OF INFECTION/BREAKDOWN Outcome: Progressing   Problem: RH SAFETY Goal: RH STG ADHERE TO SAFETY PRECAUTIONS W/ASSISTANCE/DEVICE Description STG Adhere to Safety Precautions With Assistance/Device. Min  Outcome: Progressing   Problem: RH KNOWLEDGE DEFICIT Goal: RH STG INCREASE KNOWLEDGE OF DIABETES Description Patient will be able to verbalize target blood sugar range, use of medications and diet to manage T2DM with cues/handouts  Outcome: Progressing Goal: RH STG INCREASE KNOWLEDGE OF HYPERTENSION Description Patient will be able to describe management of hypertension including diet and medication with cues/handouts  Outcome: Progressing Goal: RH STG INCREASE KNOWLEDGE OF STROKE PROPHYLAXIS Description Patient will be able to describe self care following stroke including prophylaxis medications and lifestyle modifications to reduce risk with cues/handouts  Outcome: Progressing   Problem: Consults Goal: RH STROKE PATIENT EDUCATION Description See Patient Education module for education specifics  Outcome: Progressing

## 2018-11-13 ENCOUNTER — Inpatient Hospital Stay (HOSPITAL_COMMUNITY): Payer: Medicare Other | Admitting: Occupational Therapy

## 2018-11-13 ENCOUNTER — Inpatient Hospital Stay (HOSPITAL_COMMUNITY): Payer: Medicare Other | Admitting: Physical Therapy

## 2018-11-13 LAB — GLUCOSE, CAPILLARY
Glucose-Capillary: 221 mg/dL — ABNORMAL HIGH (ref 70–99)
Glucose-Capillary: 103 mg/dL — ABNORMAL HIGH (ref 70–99)
Glucose-Capillary: 112 mg/dL — ABNORMAL HIGH (ref 70–99)
Glucose-Capillary: 124 mg/dL — ABNORMAL HIGH (ref 70–99)
Glucose-Capillary: 57 mg/dL — ABNORMAL LOW (ref 70–99)
Glucose-Capillary: 76 mg/dL (ref 70–99)

## 2018-11-13 NOTE — Progress Notes (Signed)
Physical Therapy Session Note  Patient Details  Name: Andre Wolfe MRN: [id number] Date of Birth: July 27, 1934  Today's Date: 11/13/2018 PT Individual Time: 1315-1410 PT Individual Time Calculation (min): 55 min   Short Term Goals: Week 1:  PT Short Term Goal 1 (Week 1): pt will perform functional transfers with mod A PT Short Term Goal 2 (Week 1): pt will perform gait with mod A x 25' in controlled environment  Skilled Therapeutic Interventions/Progress Updates:    pt requesting to use toilet. Pt transfers with min A with stedy, total for hygiene, min A clothing management.  Pt then performs squat pivot transfers w/c <> mat in therapy gym with mod/max A.  Sit <> stand with mod A with Lt UE over PTs shoulders.  Pt performs standing wt shifts, mini squats, reaching and squatting tasks all with focus on Lt LE strength and stability with mod manual facilitation.  Pt with LT knee buckling iwht pt requiring assist to correct, pt requires tactile cues for upright posture.  Pt performs supine LTR, bridging and PNF diagonals with focus on Lt LE coordination with improving performance with repetition.  Pt left in bed with all needs at hand, alarm set.  Therapy Documentation Precautions:  Precautions Precautions: Fall Restrictions Weight Bearing Restrictions: No Pain:  no c/o pain   Therapy/Group: Individual Therapy  Egbert Seidel 11/13/2018, 2:14 PM

## 2018-11-13 NOTE — Progress Notes (Signed)
[place] PHYSICAL MEDICINE & REHABILITATION PROGRESS NOTE   Subjective/Complaints:  Eating breakfast, no new c/os, trying to use LUE for functional tasks  ROS- neg CP, SOB, neg N/V/D Objective:   No results found. No results for input(s): WBC, HGB, HCT, PLT in the last 72 hours. No results for input(s): NA, K, CL, CO2, GLUCOSE, BUN, CREATININE, CALCIUM in the last 72 hours.  Intake/Output Summary (Last 24 hours) at 11/13/2018 0719 Last data filed at 11/13/2018 0330 Gross per 24 hour  Intake 680 ml  Output 775 ml  Net -95 ml     Physical Exam: Vital Signs Blood pressure (!) 138/56, pulse (!) 54, temperature 98.6 F (37 C), temperature source Oral, resp. rate 16, height 6' (1.829 m), weight 92.3 kg, SpO2 94 %.   General: No acute distress Mood and affect are appropriate Heart: Regular rate and rhythm no rubs murmurs or extra sounds Lungs: Clear to auscultation, breathing unlabored, no rales or wheezes Abdomen: Positive bowel sounds, soft nontender to palpation, nondistended Extremities: No clubbing, cyanosis, or edema Skin: No evidence of breakdown, no evidence of rash Neurologic: Cranial nerves II through XII intact, motor strength is 5/5 in RIght and 3- Left  deltoid, bicep, tricep, grip, hip flexor, knee extensors, ankle dorsiflexor and plantar flexor Sensory exam normal sensation to light touch and proprioception in bilateral upper and lower extremities Cerebellar exam normal finger to nose to finger as well as heel to shin in bilateral upper and lower extremities Musculoskeletal: Full range of motion in all 4 extremities. No joint swelling   Assessment/Plan: 1. Functional deficits secondary to recurrent R pon which require 3+ hours per day of interdisciplinary therapy in a comprehensive inpatient rehab setting.  Physiatrist is providing close team supervision and 24 hour management of active medical problems listed below.  Physiatrist and rehab team continue to assess  barriers to discharge/monitor patient progress toward functional and medical goals  Care Tool:  Bathing  Bathing activity did not occur: Refused           Bathing assist       Upper Body Dressing/Undressing Upper body dressing   What is the patient wearing?: Pull over shirt    Upper body assist Assist Level: Maximal Assistance - Patient 25 - 49%    Lower Body Dressing/Undressing Lower body dressing      What is the patient wearing?: Pants     Lower body assist Assist for lower body dressing: Maximal Assistance - Patient 25 - 49%     Toileting Toileting    Toileting assist Assist for toileting: Maximal Assistance - Patient 25 - 49%     Transfers Chair/bed transfer  Transfers assist  Chair/bed transfer activity did not occur: Safety/medical concerns  Chair/bed transfer assist level: Dependent - mechanical lift     Locomotion Ambulation   Ambulation assist      Assist level: Maximal Assistance - Patient 25 - 49% Assistive device: Walker-rolling Max distance: 15'   Walk 10 feet activity   Assist     Assist level: Maximal Assistance - Patient 25 - 49% Assistive device: Walker-rolling   Walk 50 feet activity   Assist Walk 50 feet with 2 turns activity did not occur: Safety/medical concerns         Walk 150 feet activity   Assist Walk 150 feet activity did not occur: Safety/medical concerns         Walk 10 feet on uneven surface  activity   Assist Walk 10 feet  on uneven surfaces activity did not occur: Safety/medical concerns         Wheelchair     Assist Will patient use wheelchair at discharge?: Yes Type of Wheelchair: Manual    Wheelchair assist level: Supervision/Verbal cueing Max wheelchair distance: 80'    Wheelchair 50 feet with 2 turns activity    Assist        Assist Level: Supervision/Verbal cueing   Wheelchair 150 feet activity     Assist Wheelchair 150 feet activity did not occur:  Safety/medical concerns(Per notes)        Medical Problem List and Plan: 1.Functional deficits and left hemiparesissecondary to right pontine infarct 11/05/2018 CIR PT, OT tent d/c of 5/18 2. Antithrombotics: -DVT/anticoagulation:Pharmaceutical:Other (comment)--Eliquis. -antiplatelet therapy: ASA 81 mg 3. Pain Management:N/A 4. Mood:LCSW to follow for evaluation and support. -antipsychotic agents: N/A 5. Neuropsych: This patientiscapable of making decisions on hisown behalf. 6. Skin/Wound Care:routine pressure relief measures. 7. Fluids/Electrolytes/Nutrition:Monitor I/O. Elevated BUN/Creat will give fluid bolus, EJ fx ok 8. Dyslipidemia: On Repatha at home. 9.HTN: Monitor BP bid with gradual normalization over 3-5 days. On low dose Norvasc and Metoprolol bid.  Mild elevation of systolic in permissive range, will start to tighten control next week Vitals:   11/12/18 1943 11/13/18 0341  BP: (!) 148/66 (!) 138/56  Pulse: (!) 56 (!) 54  Resp: 18 16  Temp: 97.6 F (36.4 C) 98.6 F (37 C)  SpO2: 98% 94%  10. Neurogenic bladder/Urinary retention: Will monitor voiding with PVR checks and resume I/O caths.  11. Reactive depression: Now on Prozac.  12. T2DM: Monitor BS ac/hs. Continue home regimen of 70/30 insulin 30U/10U/20U tid ac.   CBG (last 3)  Recent Labs    11/12/18 1643 11/12/18 2113 11/13/18 0643  GLUCAP 72 131* 103*  5/1 good control cont to monitor on Novalog 70/30  LOS: 4 days A FACE TO [place] E  11/13/2018, 7:19 AM

## 2018-11-13 NOTE — Plan of Care (Signed)
  Problem: RH BOWEL ELIMINATION Goal: RH STG MANAGE BOWEL WITH ASSISTANCE Description STG Manage Bowel with Assistance. mod  Outcome: Progressing   Problem: RH BLADDER ELIMINATION Goal: RH STG MANAGE BLADDER WITH ASSISTANCE Description STG Manage Bladder With Assistance. Mod  Outcome: Progressing   Problem: RH SKIN INTEGRITY Goal: RH STG SKIN FREE OF INFECTION/BREAKDOWN Outcome: Progressing   Problem: RH SAFETY Goal: RH STG ADHERE TO SAFETY PRECAUTIONS W/ASSISTANCE/DEVICE Description STG Adhere to Safety Precautions With Assistance/Device. Min  Outcome: Progressing   Problem: RH KNOWLEDGE DEFICIT Goal: RH STG INCREASE KNOWLEDGE OF DIABETES Description Patient will be able to verbalize target blood sugar range, use of medications and diet to manage T2DM with cues/handouts  Outcome: Progressing Goal: RH STG INCREASE KNOWLEDGE OF HYPERTENSION Description Patient will be able to describe management of hypertension including diet and medication with cues/handouts  Outcome: Progressing Goal: RH STG INCREASE KNOWLEDGE OF STROKE PROPHYLAXIS Description Patient will be able to describe self care following stroke including prophylaxis medications and lifestyle modifications to reduce risk with cues/handouts  Outcome: Progressing   Problem: Consults Goal: RH STROKE PATIENT EDUCATION Description See Patient Education module for education specifics  Outcome: Progressing

## 2018-11-13 NOTE — Progress Notes (Signed)
Occupational Therapy Session Note  Patient Details  Name: Andre Wolfe MRN: [id number] Date of Birth: Jun 10, 1935  Today's Date: 11/13/2018 OT Individual Time: 0845-1000 and 8676-7209 OT Individual Time Calculation (min): 75 min and 70 min   Short Term Goals: Week 1:  OT Short Term Goal 1 (Week 1): Pt will complete bathing with mod assist at sit > stand level OT Short Term Goal 2 (Week 1): Pt will complete UB dressing with min assist OT Short Term Goal 3 (Week 1): Pt will complete LB dressing (except footwear) with mod assist at sit > stand level OT Short Term Goal 4 (Week 1): Pt will complete toilet transfer with mod assist of one caregiver  Skilled Therapeutic Interventions/Progress Updates:    1)Treatment session with focus on self-care retraining with bathroom transfers, bathing and dressing.  Pt received supine in bed agreeable to shower; pt with no c/o pain.  Pt completed bed mobility with mod assist and transferred to shower seat in room shower via [place].  Engaged in bathing with min assist throughout for sitting balance and mod assist when standing to attempt washing buttocks.  Dressing completed at sit > stand level with mod cues for hemi-technique and mod assist sit > stand when pulling pants over hips.  Pt with posterior lean in standing, requiring multimodal cues for postural control during dressing tasks. Pt completed grooming tasks in sitting with use of LUE as gross assist.  Engaged in [place] while completing peg board activity.  Therapist providing facilitation at elbow during reaching.  Pt remained upright in w/c with all needs in reach and seat belt alarm on.    2) Treatment session with focus on LUE NMR and trunk control during sitting and standing tasks.  Pt received upright in w/c with no c/o pain.  Pt propelled w/c 100' with min assist due to tendency to veer to Lt due to LUE weakness.  Completed squat pivot transfer to Rt with mod assist with improved weight shift and  initiation.  Engaged in [place] in supine with focus on shoulder flexion, protraction, and abduction. Therapist facilitated full ROM followed by use of dowel rod to facilitate symmetrical ROM during AAROM.  Engaged in reaching tasks in sitting as well with continued focus shoulder AROM, this time in low to mid range.  Increased challenge to include dynamic standing balance.  Therapist providing min-mod assist for upright posture during reaching activity in standing due to posterior lean.  Pt able to utilize LUE to grasp and manipulate resistive clothespins while in standing increasing reaching to mid range.  Returned to w/c max assist squat pivot to Lt and then left upright in w/c with seat belt alarm on and all needs in reach.  Therapy Documentation Precautions:  Precautions Precautions: Fall Restrictions Weight Bearing Restrictions: No General:   Vital Signs: Therapy Vitals Pulse Rate: 60 BP: (!) 164/69 Patient Position (if appropriate): Lying Oxygen Therapy SpO2: 97 % O2 Device: Room Air Pain: Pain Assessment Pain Scale: Faces Faces Pain Scale: No hurt   Therapy/Group: Individual Therapy  [name] 11/13/2018, 12:23 PM

## 2018-11-14 ENCOUNTER — Inpatient Hospital Stay (HOSPITAL_COMMUNITY): Payer: Medicare Other | Admitting: Occupational Therapy

## 2018-11-14 ENCOUNTER — Inpatient Hospital Stay (HOSPITAL_COMMUNITY): Payer: Medicare Other | Admitting: Speech Pathology

## 2018-11-14 ENCOUNTER — Inpatient Hospital Stay (HOSPITAL_COMMUNITY): Payer: Medicare Other | Admitting: Physical Therapy

## 2018-11-14 DIAGNOSIS — R7309 Other abnormal glucose: Secondary | ICD-10-CM

## 2018-11-14 DIAGNOSIS — N319 Neuromuscular dysfunction of bladder, unspecified: Secondary | ICD-10-CM

## 2018-11-14 DIAGNOSIS — N179 Acute kidney failure, unspecified: Secondary | ICD-10-CM

## 2018-11-14 DIAGNOSIS — E162 Hypoglycemia, unspecified: Secondary | ICD-10-CM

## 2018-11-14 DIAGNOSIS — E119 Type 2 diabetes mellitus without complications: Secondary | ICD-10-CM

## 2018-11-14 DIAGNOSIS — R339 Retention of urine, unspecified: Secondary | ICD-10-CM

## 2018-11-14 LAB — GLUCOSE, CAPILLARY
Glucose-Capillary: 94 mg/dL (ref 70–99)
Glucose-Capillary: 155 mg/dL — ABNORMAL HIGH (ref 70–99)
Glucose-Capillary: 134 mg/dL — ABNORMAL HIGH (ref 70–99)
Glucose-Capillary: 184 mg/dL — ABNORMAL HIGH (ref 70–99)

## 2018-11-14 MED ORDER — AMLODIPINE BESYLATE 2.5 MG PO TABS
2.5000 mg | ORAL_TABLET | Freq: Once | ORAL | Status: AC
  Administered 2018-11-14: 15:00:00 2.5 mg via ORAL
  Filled 2018-11-14: qty 1

## 2018-11-14 MED ORDER — INSULIN ASPART PROT & ASPART (70-30 MIX) 100 UNIT/ML ~~LOC~~ SUSP
12.0000 [IU] | Freq: Every day | SUBCUTANEOUS | Status: DC
  Administered 2018-11-14 – 2018-11-24 (×8): 12 [IU] via SUBCUTANEOUS

## 2018-11-14 MED ORDER — AMLODIPINE BESYLATE 5 MG PO TABS
5.0000 mg | ORAL_TABLET | Freq: Every day | ORAL | Status: DC
  Administered 2018-11-15 – 2018-12-03 (×19): 5 mg via ORAL
  Filled 2018-11-14 (×19): qty 1

## 2018-11-14 MED ORDER — INSULIN ASPART PROT & ASPART (70-30 MIX) 100 UNIT/ML ~~LOC~~ SUSP
5.0000 [IU] | Freq: Every day | SUBCUTANEOUS | Status: DC
  Administered 2018-11-14 – 2018-11-23 (×8): 5 [IU] via SUBCUTANEOUS
  Filled 2018-11-14: qty 10

## 2018-11-14 NOTE — Progress Notes (Signed)
[facility] PHYSICAL MEDICINE & REHABILITATION PROGRESS NOTE   Subjective/Complaints: Patient seen laying in bed this morning.  He states he slept well overnight.  He denies complaints.  Discussed with nursing, patient with hypoglycemia overnight.  ROS-denies CP, shortness of breath, nausea, vomiting, diarrhea.    Objective:   No results found. No results for input(s): WBC, HGB, HCT, PLT in the last 72 hours. No results for input(s): NA, K, CL, CO2, GLUCOSE, BUN, CREATININE, CALCIUM in the last 72 hours.  Intake/Output Summary (Last 24 hours) at 11/14/2018 1326 Last data filed at 11/14/2018 1311 Gross per 24 hour  Intake 702 ml  Output 81 ml  Net 621 ml     Physical Exam: Vital Signs Blood pressure (!) 154/64, pulse (!) 58, temperature 98.2 F (36.8 C), resp. rate 18, height 6' (1.829 m), weight 92.3 kg, SpO2 98 %. Constitutional: No distress . Vital signs reviewed. HENT: Normocephalic.  Atraumatic. Eyes: EOMI. No discharge. Cardiovascular: No JVD. Respiratory: Normal effort. GI: Non-distended. Musc: No edema or tenderness in extremities. Neurologic: Alert Motor: RUE/RLE: 5/5 proximal distal LUE/LLE: 3/5 proximal distal  Skin: Warm and dry.  Intact.  Assessment/Plan: 1. Functional deficits secondary to recurrent R pon which require 3+ hours per day of interdisciplinary therapy in a comprehensive inpatient rehab setting.  Physiatrist is providing close team supervision and 24 hour management of active medical problems listed below.  Physiatrist and rehab team continue to assess barriers to discharge/monitor patient progress toward functional and medical goals  Care Tool:  Bathing  Bathing activity did not occur: Refused Body parts bathed by patient: Left arm, Chest, Abdomen, Front perineal area, Right upper leg, Left upper leg, Face   Body parts bathed by helper: Right arm, Buttocks, Right lower leg, Left lower leg     Bathing assist Assist Level: Moderate Assistance -  Patient 50 - 74%     Upper Body Dressing/Undressing Upper body dressing   What is the patient wearing?: Pull over shirt    Upper body assist Assist Level: Moderate Assistance - Patient 50 - 74%    Lower Body Dressing/Undressing Lower body dressing      What is the patient wearing?: Pants     Lower body assist Assist for lower body dressing: Maximal Assistance - Patient 25 - 49%     Toileting Toileting    Toileting assist Assist for toileting: Maximal Assistance - Patient 25 - 49%     Transfers Chair/bed transfer  Transfers assist  Chair/bed transfer activity did not occur: Safety/medical concerns  Chair/bed transfer assist level: Moderate Assistance - Patient 50 - 74%     Locomotion Ambulation   Ambulation assist      Assist level: Maximal Assistance - Patient 25 - 49% Assistive device: Walker-rolling Max distance: 15'   Walk 10 feet activity   Assist     Assist level: Maximal Assistance - Patient 25 - 49% Assistive device: Walker-rolling   Walk 50 feet activity   Assist Walk 50 feet with 2 turns activity did not occur: Safety/medical concerns         Walk 150 feet activity   Assist Walk 150 feet activity did not occur: Safety/medical concerns         Walk 10 feet on uneven surface  activity   Assist Walk 10 feet on uneven surfaces activity did not occur: Safety/medical concerns         Wheelchair     Assist Will patient use wheelchair at discharge?: Yes Type of  Wheelchair: Agricultural engineer assist level: Supervision/Verbal cueing Max wheelchair distance: 47'    Wheelchair 50 feet with 2 turns activity    Assist        Assist Level: Supervision/Verbal cueing   Wheelchair 150 feet activity     Assist Wheelchair 150 feet activity did not occur: Safety/medical concerns(Per notes)        Medical Problem List and Plan: 1.Functional deficits and left hemiparesissecondary to right pontine infarct on  11/05/2018  Continue CIR 2. Antithrombotics: -DVT/anticoagulation:Pharmaceutical:Other (comment)--Eliquis. -antiplatelet therapy: ASA 81 mg 3. Pain Management:N/A 4. Mood:LCSW to follow for evaluation and support. -antipsychotic agents: N/A 5. Neuropsych: This patientiscapable of making decisions on hisown behalf. 6. Skin/Wound Care:routine pressure relief measures. 7. Fluids/Electrolytes/Nutrition:Monitor I/O.  8. Dyslipidemia: On Repatha at home. 9.HTN: Monitor BID. On low dose Metoprolol bid.   Norvasc increased to 5 mg on 5/2 Vitals:   11/14/18 0525 11/14/18 0915  BP: (!) 157/68 (!) 154/64  Pulse: (!) 52 (!) 58  Resp: 18   Temp: 98.2 F (36.8 C)   SpO2: 98%   10. Neurogenic bladder/Urinary retention:   PVRs improving overall, continue to monitor  Resume I/O caths.  11. Reactive depression: Now on Prozac.  12. T2DM: Monitor BS ac/hs. Home regimen of 70/30 insulin changed to:  35 units with breakfast   5 units with lunch   12 units with supper    CBG (last 3)  Recent Labs    11/13/18 2147 11/14/18 0631 11/14/18 1205  GLUCAP 76 94 155*   Labile on 5/2 with hypoglycemia overnight 13.  AKI  Creatinine 1.30 on 4/28  Fluid bolus given on 4/29  Encourage fluids  Labs ordered for Monday   LOS: 5 days A FACE TO FACE EVALUATION WAS PERFORMED  Ankit Lorie Phenix 11/14/2018, 1:26 PM

## 2018-11-14 NOTE — Progress Notes (Signed)
Occupational Therapy Session Note  Patient Details  Name: Andre Wolfe MRN: [id number] Date of Birth: [birth year]  Today's Date: 11/14/2018 OT Individual Time: 0354-6568 OT Individual Time Calculation (min): 59 min    Short Term Goals: Week 1:  OT Short Term Goal 1 (Week 1): Pt will complete bathing with mod assist at sit > stand level OT Short Term Goal 2 (Week 1): Pt will complete UB dressing with min assist OT Short Term Goal 3 (Week 1): Pt will complete LB dressing (except footwear) with mod assist at sit > stand level OT Short Term Goal 4 (Week 1): Pt will complete toilet transfer with mod assist of one caregiver  Skilled Therapeutic Interventions/Progress Updates:    Upon entering the room, pt supine in bed with no c/o pain this session. Pt agreeable to OT intervention. Supine >sit with mod A to EOB with mod cuing and min A for sitting balance.  Pt declined bathing this session but requesting to change clothing items. Pt donning pull over pants with figure four position but needing mod assistance for forward weight shirt secondary to posterior bias. Pt standing with max A for balance and needing assistance to pull pants over B hips. Pt transferred into wheelchair with mod squat pivot. Pt propelled wheelchair to sink for grooming tasks while utilizing B UEs at overall supervision level. OT assisted pt via wheelchair to gym for dynavision task. Focus on forward weight shift and use of L UE. Bottom half of board only. Pt with min A for seated forward weight shift when reaching out of base of support. Pt participate in activity for 3 minutes with L UE reaction time being 3.7 seconds and R UE being 1.6 seconds. Pt propelled wheelchair 150' back towards room with min A for safety. Pt remained in wheelchair with chair alarm belt donned and call bell within reach upon exiting the room.   Therapy Documentation Precautions:  Precautions Precautions: Fall Restrictions Weight Bearing Restrictions:  No General:   Vital Signs: Therapy Vitals Pulse Rate: (!) 58 BP: (!) 154/64    Therapy/Group: Individual Therapy  Gypsy Decant 11/14/2018, 10:00 AM

## 2018-11-14 NOTE — Progress Notes (Signed)
Hypoglycemic Event  CBG: 57  Treatment: ice cream  Symptoms: none  Follow-up CBG: Time:2147 CBG Result:76  Possible Reasons for Event: declined snack  Comments/MD notified:Dr Posey Pronto on rounds    Antoine Primas

## 2018-11-14 NOTE — Progress Notes (Signed)
Physical Therapy Session Note  Patient Details  Name: Andre Wolfe MRN: [id number] Date of Birth: 11/07/1934  Today's Date: 11/14/2018 PT Individual Time: 1300-1400 PT Individual Time Calculation (min): 60 min   Short Term Goals: Week 1:  PT Short Term Goal 1 (Week 1): pt will perform functional transfers with mod A PT Short Term Goal 2 (Week 1): pt will perform gait with mod A x 25' in controlled environment  Skilled Therapeutic Interventions/Progress Updates:   Pt received sitting in WC and agreeable to PT. Pt instructed in WC mobility x 100ft with supervision - min assist from PT to maintain straight path.   Gait training at rail in hall 2 x 15 with min assist overall and visual feedback from mirror. Gait training also performed with RW, L hand orthotic, and visual feedback from mirror x 20ft; mod assist fading to max assist for last 5ft and for turn to prepare for sit on EOB.   Sit<>stand from WC with RW and L hand splint. Completed x 4 with mod-min assist and visual feedback from mirror. Pt able to correct L LOB with cues to use mirror. No knee instability on this day. Lateral reaches to the R 3 x 4 with mod cues for improved knee extension on the L and use of ankle strategy to improve WB on the R.   Pt returned to room and performed stand pivot transfer to bed with RW and mod assist from PT. Sit>supine completed with supervision assist, and left supine in bed with call bell in reach and all needs met.        Therapy Documentation Precautions:  Precautions Precautions: Fall Restrictions Weight Bearing Restrictions: No Vital Signs: Therapy Vitals Pulse Rate: (!) 58 BP: (!) 154/64 Pain: Pain Assessment Pain Scale: 0-10 Pain Score: 0-No pain   Therapy/Group: Individual Therapy  Austin E Tucker 11/14/2018, 1:08 PM  

## 2018-11-15 LAB — GLUCOSE, CAPILLARY
Glucose-Capillary: 98 mg/dL (ref 70–99)
Glucose-Capillary: 121 mg/dL — ABNORMAL HIGH (ref 70–99)
Glucose-Capillary: 83 mg/dL (ref 70–99)
Glucose-Capillary: 143 mg/dL — ABNORMAL HIGH (ref 70–99)

## 2018-11-15 MED ORDER — TAMSULOSIN HCL 0.4 MG PO CAPS
0.4000 mg | ORAL_CAPSULE | Freq: Every day | ORAL | Status: DC
  Administered 2018-11-15 – 2018-12-02 (×18): 0.4 mg via ORAL
  Filled 2018-11-15 (×20): qty 1

## 2018-11-15 NOTE — Progress Notes (Signed)
[facility] PHYSICAL MEDICINE & REHABILITATION PROGRESS NOTE   Subjective/Complaints: Patient seen laying in bed this morning.  He states he slept well overnight.  He notes constipation, but feels that he is going to have a bowel movement soon and does not want to make any medication changes at present.  ROS: Denies CP, shortness of breath, nausea, vomiting, diarrhea.    Objective:   No results found. No results for input(s): WBC, HGB, HCT, PLT in the last 72 hours. No results for input(s): NA, K, CL, CO2, GLUCOSE, BUN, CREATININE, CALCIUM in the last 72 hours.  Intake/Output Summary (Last 24 hours) at 11/15/2018 1248 Last data filed at 11/15/2018 0733 Gross per 24 hour  Intake 644 ml  Output -  Net 644 ml     Physical Exam: Vital Signs Blood pressure 133/60, pulse (!) 58, temperature 98.4 F (36.9 C), temperature source Oral, resp. rate 18, height 6' (1.829 m), weight 91.9 kg, SpO2 100 %. Constitutional: No distress . Vital signs reviewed. HENT: Normocephalic.  Atraumatic. Eyes: EOMI.  No discharge. Cardiovascular: No JVD. Respiratory: Normal effort. GI: Non-distended. Musc: No edema or tenderness in extremities. Neurologic: Alert Motor: RUE/RLE: 5/5 proximal distal LUE/LLE: 3+/5 proximal to distal Skin: Warm and dry.  Intact.  Assessment/Plan: 1. Functional deficits secondary to recurrent R pon which require 3+ hours per day of interdisciplinary therapy in a comprehensive inpatient rehab setting.  Physiatrist is providing close team supervision and 24 hour management of active medical problems listed below.  Physiatrist and rehab team continue to assess barriers to discharge/monitor patient progress toward functional and medical goals  Care Tool:  Bathing  Bathing activity did not occur: Refused Body parts bathed by patient: Left arm, Chest, Abdomen, Front perineal area, Right upper leg, Left upper leg, Face   Body parts bathed by helper: Right arm, Buttocks, Right  lower leg, Left lower leg     Bathing assist Assist Level: Moderate Assistance - Patient 50 - 74%     Upper Body Dressing/Undressing Upper body dressing   What is the patient wearing?: Pull over shirt    Upper body assist Assist Level: Moderate Assistance - Patient 50 - 74%    Lower Body Dressing/Undressing Lower body dressing      What is the patient wearing?: Pants     Lower body assist Assist for lower body dressing: Maximal Assistance - Patient 25 - 49%     Toileting Toileting    Toileting assist Assist for toileting: Maximal Assistance - Patient 25 - 49%     Transfers Chair/bed transfer  Transfers assist  Chair/bed transfer activity did not occur: Safety/medical concerns  Chair/bed transfer assist level: Moderate Assistance - Patient 50 - 74%     Locomotion Ambulation   Ambulation assist      Assist level: Maximal Assistance - Patient 25 - 49% Assistive device: Walker-rolling Max distance: 15'   Walk 10 feet activity   Assist     Assist level: Maximal Assistance - Patient 25 - 49% Assistive device: Walker-rolling   Walk 50 feet activity   Assist Walk 50 feet with 2 turns activity did not occur: Safety/medical concerns         Walk 150 feet activity   Assist Walk 150 feet activity did not occur: Safety/medical concerns         Walk 10 feet on uneven surface  activity   Assist Walk 10 feet on uneven surfaces activity did not occur: Safety/medical concerns  Wheelchair     Assist Will patient use wheelchair at discharge?: Yes Type of Wheelchair: Manual    Wheelchair assist level: Supervision/Verbal cueing Max wheelchair distance: 81'    Wheelchair 50 feet with 2 turns activity    Assist        Assist Level: Supervision/Verbal cueing   Wheelchair 150 feet activity     Assist Wheelchair 150 feet activity did not occur: Safety/medical concerns(Per notes)        Medical Problem List and  Plan: 1.Functional deficits and left hemiparesissecondary to right pontine infarct on 11/05/2018  Continue CIR 2. Antithrombotics: -DVT/anticoagulation:Pharmaceutical:Other (comment)--Eliquis. -antiplatelet therapy: ASA 81 mg 3. Pain Management:N/A 4. Mood:LCSW to follow for evaluation and support. -antipsychotic agents: N/A 5. Neuropsych: This patientiscapable of making decisions on hisown behalf. 6. Skin/Wound Care:routine pressure relief measures. 7. Fluids/Electrolytes/Nutrition:Monitor I/O.  8. Dyslipidemia: On Repatha at home. 9.HTN: Monitor BID. On low dose Metoprolol bid.   Norvasc increased to 5 mg on 5/2 Vitals:   11/15/18 0445 11/15/18 0822  BP: 137/62 133/60  Pulse: (!) 56 (!) 58  Resp: 18   Temp: 98.4 F (36.9 C)   SpO2: 100%    Controlled on 5/3 10. Neurogenic bladder/Urinary retention:   Flomax started on 5/3  Resume I/O caths.  11. Reactive depression: Now on Prozac.  12. T2DM: Monitor BS ac/hs. due to hypoglycemia, Home regimen of 70/30 insulin changed to:  35 units with breakfast   5 units with lunch   12 units with supper    CBG (last 3)  Recent Labs    11/14/18 2112 11/15/18 0614 11/15/18 1206  GLUCAP 184* 98 121*   Labile on 5/3, monitor for trend 13.  AKI  Creatinine 1.30 on 4/28  Fluid bolus given on 4/29  Encourage fluids  Labs ordered for tomorrow   LOS: 6 days A FACE TO FACE EVALUATION WAS PERFORMED  Ankit Lorie Phenix 11/15/2018, 12:48 PM

## 2018-11-16 ENCOUNTER — Encounter (HOSPITAL_COMMUNITY): Payer: Medicare Other | Admitting: Psychology

## 2018-11-16 ENCOUNTER — Inpatient Hospital Stay (HOSPITAL_COMMUNITY): Payer: Medicare Other | Admitting: Occupational Therapy

## 2018-11-16 ENCOUNTER — Inpatient Hospital Stay (HOSPITAL_COMMUNITY): Payer: Medicare Other | Admitting: Physical Therapy

## 2018-11-16 DIAGNOSIS — Z8659 Personal history of other mental and behavioral disorders: Secondary | ICD-10-CM

## 2018-11-16 LAB — BASIC METABOLIC PANEL
Anion gap: 8 (ref 5–15)
BUN: 19 mg/dL (ref 8–23)
CO2: 25 mmol/L (ref 22–32)
Calcium: 9.1 mg/dL (ref 8.9–10.3)
Chloride: 105 mmol/L (ref 98–111)
Creatinine, Ser: 1.13 mg/dL (ref 0.61–1.24)
GFR calc Af Amer: >60 mL/min (ref >60)
GFR calc non Af Amer: >60 mL/min (ref >60)
Glucose, Bld: 84 mg/dL (ref 70–99)
Potassium: 3.9 mmol/L (ref 3.5–5.1)
Sodium: 138 mmol/L (ref 135–145)

## 2018-11-16 LAB — GLUCOSE, CAPILLARY
Glucose-Capillary: 89 mg/dL (ref 70–99)
Glucose-Capillary: 78 mg/dL (ref 70–99)
Glucose-Capillary: 143 mg/dL — ABNORMAL HIGH (ref 70–99)
Glucose-Capillary: 158 mg/dL — ABNORMAL HIGH (ref 70–99)

## 2018-11-16 LAB — CBC
HCT: 44.8 % (ref 39.0–52.0)
Hemoglobin: 15.1 g/dL (ref 13.0–17.0)
MCH: 29 pg (ref 26.0–34.0)
MCHC: 33.7 g/dL (ref 30.0–36.0)
MCV: 86.2 fL (ref 80.0–100.0)
Platelets: 231 10*3/uL (ref 150–400)
RBC: 5.2 MIL/uL (ref 4.22–5.81)
RDW: 12.6 % (ref 11.5–15.5)
WBC: 7.2 10*3/uL (ref 4.0–10.5)
nRBC: 0 % (ref 0.0–0.2)

## 2018-11-16 NOTE — Progress Notes (Signed)
Occupational Therapy Session Note  Patient Details  Name: EPHRAM KORNEGAY MRN: [id number] Date of Birth: [birth year]  Today's Date: 11/16/2018 OT Individual Time: 1230-1255 OT Individual Time Calculation (min): 25 min    Short Term Goals: Week 1:  OT Short Term Goal 1 (Week 1): Pt will complete bathing with mod assist at sit > stand level OT Short Term Goal 2 (Week 1): Pt will complete UB dressing with min assist OT Short Term Goal 3 (Week 1): Pt will complete LB dressing (except footwear) with mod assist at sit > stand level OT Short Term Goal 4 (Week 1): Pt will complete toilet transfer with mod assist of one caregiver  Skilled Therapeutic Interventions/Progress Updates:    Upon entering the room, pt supine in bed with HOB elevated and starting lunch. Pt demonstrated ability to open containers on tray with increased time and B UEs. Pt able to cut meat on tray with increased time with min cuing for technique and to attend to L hand during B coordination task. Pt with multiple drops of utensils from L UE when cutting food secondary to strengthen and inattention. Pt continuing to eat meal as therapist exited the room. Bed alarm activated and call bell within reach upon exiting the room.   Therapy Documentation Precautions:  Precautions Precautions: Fall Restrictions Weight Bearing Restrictions: No General:   Vital Signs: Therapy Vitals BP: (!) 108/51 Patient Position (if appropriate): Sitting   Therapy/Group: Individual Therapy  Gypsy Decant 11/16/2018, 12:57 PM

## 2018-11-16 NOTE — Progress Notes (Addendum)
Physical Therapy Session Note  Patient Details  Name: Andre Wolfe MRN: [id number] Date of Birth: Nov 12, 1934  Today's Date: 11/16/2018 PT Individual Time: [phone] and 1030-1125 PT Individual Time Calculation (min): 41 min and 55 min   Short Term Goals: Week 1:  PT Short Term Goal 1 (Week 1): pt will perform functional transfers with mod A PT Short Term Goal 2 (Week 1): pt will perform gait with mod A x 25' in controlled environment  Skilled Therapeutic Interventions/Progress Updates:    pt performs supine to sit with min A for trunk control and motor planning.  Squat pivot transfers with mod A throughotu session, cues for UE and LE placement, facilitation for wt shifts and head/hips relationship.  Sit <> stand blocked practice with focus on forward wt shift with mod A.  Gait with RW 20' x 3 with Lt ankle ace wrapped, mod A for Lt LE control and placement, wt shifts and RW control.  nustep x 6 minutes level 4 with Lt UE support and gait belt to prevent Lt hip ER.  Pt left in w/c with alarm set, needs at hand.  Session 2: pt with no c/o pain.  Pt performs w/c mobility in controlled environment with hemi technique with supervision and increased time.  Pt performs standing tap ups to 4'' step with Rt LE with focus on Lt LE stance control and posture with +2 HHA, mod A.  Tap ups with Lt LE with tactile cues with focus on increasing speed and reaction time with mod/max A.  Gait training with 3 musketeers style with focus on increased cadence and improved Lt LE coordination and control with mod/max A for wt shifts, Lt LE activation and placement. Pt continues with bilat knee buckling when fatigued requiring total +2 assist to correct.  Pt c/o "dizziness" at end of session, BP 108/51, pt in bed in supine with decreased dizziness. Pt left with alarm set, needs at hand.    Therapy Documentation Precautions:  Precautions Precautions: Fall Restrictions Weight Bearing Restrictions: No Pain:  no c/o  pain   Therapy/Group: Individual Therapy  Jvion Turgeon 11/16/2018, 9:44 AM

## 2018-11-16 NOTE — Progress Notes (Signed)
[place] PHYSICAL MEDICINE & REHABILITATION PROGRESS NOTE   Subjective/Complaints:  Doesn't feel much improvement in leg strenght on left Sensation reported as equal bilaterally  ROS: Denies CP, shortness of breath, nausea, vomiting, diarrhea.    Objective:   No results found. Recent Labs    11/16/18 0627  WBC 7.2  HGB 15.1  HCT 44.8  PLT 231   Recent Labs    11/16/18 0627  NA 138  K 3.9  CL 105  CO2 25  GLUCOSE 84  BUN 19  CREATININE 1.13  CALCIUM 9.1    Intake/Output Summary (Last 24 hours) at 11/16/2018 0738 Last data filed at 11/15/2018 1811 Gross per 24 hour  Intake 187 ml  Output -  Net 187 ml     Physical Exam: Vital Signs Blood pressure (!) 131/58, pulse (!) 50, temperature 98.1 F (36.7 C), temperature source Oral, resp. rate 16, height 6' (1.829 m), weight 91.9 kg, SpO2 96 %. Constitutional: No distress . Vital signs reviewed. HENT: Normocephalic.  Atraumatic. Eyes: EOMI.  No discharge. Cardiovascular: No JVD. Respiratory: Normal effort. GI: Non-distended. Musc: No edema or tenderness in extremities. Neurologic: Alert Motor: RUE/RLE: 5/5 proximal distal LUE/LLE: 3+/5 proximal to distal Skin: Warm and dry.  Intact.  Assessment/Plan: 1. Functional deficits secondary to recurrent R pon which require 3+ hours per day of interdisciplinary therapy in a comprehensive inpatient rehab setting.  Physiatrist is providing close team supervision and 24 hour management of active medical problems listed below.  Physiatrist and rehab team continue to assess barriers to discharge/monitor patient progress toward functional and medical goals  Care Tool:  Bathing  Bathing activity did not occur: Refused Body parts bathed by patient: Left arm, Chest, Abdomen, Front perineal area, Right upper leg, Left upper leg, Face   Body parts bathed by helper: Right arm, Buttocks, Right lower leg, Left lower leg     Bathing assist Assist Level: Moderate Assistance -  Patient 50 - 74%     Upper Body Dressing/Undressing Upper body dressing   What is the patient wearing?: Pull over shirt    Upper body assist Assist Level: Moderate Assistance - Patient 50 - 74%    Lower Body Dressing/Undressing Lower body dressing      What is the patient wearing?: Pants     Lower body assist Assist for lower body dressing: Maximal Assistance - Patient 25 - 49%     Toileting Toileting    Toileting assist Assist for toileting: Maximal Assistance - Patient 25 - 49%     Transfers Chair/bed transfer  Transfers assist  Chair/bed transfer activity did not occur: Safety/medical concerns  Chair/bed transfer assist level: Moderate Assistance - Patient 50 - 74%     Locomotion Ambulation   Ambulation assist      Assist level: Maximal Assistance - Patient 25 - 49% Assistive device: Walker-rolling Max distance: 15'   Walk 10 feet activity   Assist     Assist level: Maximal Assistance - Patient 25 - 49% Assistive device: Walker-rolling   Walk 50 feet activity   Assist Walk 50 feet with 2 turns activity did not occur: Safety/medical concerns         Walk 150 feet activity   Assist Walk 150 feet activity did not occur: Safety/medical concerns         Walk 10 feet on uneven surface  activity   Assist Walk 10 feet on uneven surfaces activity did not occur: Safety/medical concerns  Wheelchair     Assist Will patient use wheelchair at discharge?: Yes Type of Wheelchair: Manual    Wheelchair assist level: Supervision/Verbal cueing Max wheelchair distance: 96'    Wheelchair 50 feet with 2 turns activity    Assist        Assist Level: Supervision/Verbal cueing   Wheelchair 150 feet activity     Assist Wheelchair 150 feet activity did not occur: Safety/medical concerns(Per notes)        Medical Problem List and Plan: 1.Functional deficits and left hemiparesissecondary to right pontine infarct on  11/05/2018  Continue CIR 2. Antithrombotics: -DVT/anticoagulation:Pharmaceutical:Other (comment)--Eliquis. -antiplatelet therapy: ASA 81 mg 3. Pain Management:N/A 4. Mood:LCSW to follow for evaluation and support. -antipsychotic agents: N/A 5. Neuropsych: This patientiscapable of making decisions on hisown behalf. 6. Skin/Wound Care:routine pressure relief measures. 7. Fluids/Electrolytes/Nutrition:Monitor I/O.  8. Dyslipidemia: On Repatha at home. 9.HTN: Monitor BID. On low dose Metoprolol bid.   Norvasc increased to 5 mg on 5/2 Vitals:   11/15/18 2045 11/16/18 0427  BP:  (!) 131/58  Pulse: (!) 58 (!) 50  Resp:  16  Temp:  98.1 F (36.7 C)  SpO2:  96%   Controlled on 5/4 10. Neurogenic bladder/Urinary retention:   Flomax started on 5/3  Resume I/O caths.  11. Reactive depression: Now on Prozac.  12. T2DM: Monitor BS ac/hs. due to hypoglycemia, Home regimen of 70/30 insulin changed to:  35 units with breakfast   5 units with lunch   12 units with supper    CBG (last 3)  Recent Labs    11/15/18 1643 11/15/18 2102 11/16/18 0638  GLUCAP 83 143* 89   Labile on 5/3, monitor for trend 13.  AKI  Creatinine 1.30 on 4/28  Fluid bolus given on 4/29  Encourage fluids  Labs ordered for tomorrow   LOS: 7 days A FACE TO [place] E  11/16/2018, 7:38 AM

## 2018-11-16 NOTE — Progress Notes (Signed)
Occupational Therapy Session Note  Patient Details  Name: Andre Wolfe MRN: [id number] Date of Birth: 1934-11-04  Today's Date: 11/16/2018 OT Individual Time: 1350-1500 OT Individual Time Calculation (min): 70 min    Short Term Goals: Week 1:  OT Short Term Goal 1 (Week 1): Pt will complete bathing with mod assist at sit > stand level OT Short Term Goal 2 (Week 1): Pt will complete UB dressing with min assist OT Short Term Goal 3 (Week 1): Pt will complete LB dressing (except footwear) with mod assist at sit > stand level OT Short Term Goal 4 (Week 1): Pt will complete toilet transfer with mod assist of one caregiver  Skilled Therapeutic Interventions/Progress Updates:    Treatment session with focus on functional mobility, dynamic standing balance, and LUE NMR.  Pt received supine in bed agreeable to therapy session.  Pt reports need to toilet, therefore utilized [place] to decrease number of transfers to toilet.  Min assist bed mobility with multimodal cues for sequencing.  Pt able to complete sit > stand in [place] with min assist and then mod assist when standing from lower toilet seat.  Pt attempted hygiene with lateral leans, requiring assist for thoroughness and then clothing management.  Squat pivot transfers mod assist to Rt and Lt with mod multimodal cues for sequencing and weight shifting.  Engaged in [place] with focus on shoulder flexion and protraction as needed for reaching when completing grooming and self-care tasks.  Utilized reaching for pegs and then placing in cup to further facilitate flexion and protraction, increased challenge to incorporate slight abduction.  Pt reports lightheadedness and headache.  BP 125/62.  Returned to room and transferred mod assist to [place] and returned to semi-reclined in bed.  RN notified of headache.  Therapy Documentation Precautions:  Precautions Precautions: Fall Restrictions Weight Bearing Restrictions: No General:   Vital Signs: Therapy  Vitals BP: (!) 108/51 Patient Position (if appropriate): Sitting Pain:   Pt with c/o headache, rated 7/10.  RN notified  Therapy/Group: Individual Therapy  [name] 11/16/2018, 3:15 PM

## 2018-11-16 NOTE — Consult Note (Signed)
Neuropsychological Consultation   Patient:   Andre Wolfe   DOB:   07/12/1935  MR Number:  [id number]  Location:  [facility] [id number] [place] [address] Dept: [place]: [phone]           Date of Service:   11/16/2018  Start Time:   8 AM  End Time:   9 AM  Provider/Observer:  Ilean Skill, Psy.D.       Clinical Neuropsychologist       Billing Code/Service: [phone]  Chief Complaint:    Andre Wolfe is an 84 year old male with history of HTN, CAD, diabeties, peripheral neuropathy and gait disorder, PAF, CVA with CIR stay in 2019.  Pt readmitted on 11/05/18 with one day history of left facial droop with dysarthria and left facial weakness.  Patient initially refused to come in due to COVID 19 but symptoms did not resolve.  MRI revealed atherosclerosis with chronic severe L-PCA origin stenosis.  MRI also showed recurrent right pontine stroke.  Patient has pas history of depression and anxiety and Prozac was added for reactive depression.  Patient reports today that he has history of depression that has always been associated with life events and reactive in nature.  Reports that depression in not worsening and he is seeing motor function improvements in both leg and arm.    Reason for Service:  The patient was referred for neuropsychological consultation due to coping and adjustment issues and monitor for any increase in depressive symptoms.  Below is the HPI for the current admission.  HPI:Andre Wolfe is an 84 year old male with history of HTN, CAD, T2DM with peripheral neuropathy and gait disorder, PAF s/p DCCV, CVA 06/2018 with CIR stay and d/c to home at supervision level. He was readmitted on 11/05/18 with one day history of Left facial droop with dysarthria and left facial weakness. He refused to come in due to COVID 19 but symptoms did not resolve therefore was brought in as code  stroke. CTA head/neck was negative for LVO and showed stable widespread intracranial atherosclerosis with chronic severe L-PCA origin stenosis. Marland Kitchen MRI brain done revealing recurrent right pontine stroke and Dr. Erlinda Hong recommended adding ASA to Eliquis for stroke felt to be due to small vessel disease. Prozac added for reactive depression with anxiety. Patient with resultant left facial weakness with mild dysarthria and left sided weakness affecting mobility and ADLs. CIR recommended due to functional decline.   Current Status:  Patient denies any worsening of his depressive symptoms over the past few days and reports that the Prozac is a help and denies signficant increase in symptoms.  Reports that Anxiety has improved and his comfort in hospital due to fear of COVID 19 has reduced seeing all the precautions staff is taking.  BECK depression Inventory has symptoms in the lower end of mild symptoms   Behavioral Observation: [name]  presents as a 84 y.o.-year-old Right Caucasian Male who appeared his stated age. his dress was Appropriate and he was Well Groomed and his manners were Appropriate to the situation.  his participation was indicative of Appropriate behaviors.  There were any physical disabilities noted.  he displayed an appropriate level of cooperation and motivation.     Interactions:    Active Appropriate and Attentive  Attention:   within normal limits and attention span and concentration were age appropriate  Memory:   within normal  limits; recent and remote memory intact  Visuo-spatial:  not examined  Speech (Volume):  normal  Speech:   normal; no significant slurring  Thought Process:  Coherent and Relevant  Though Content:  WNL; not suicidal and not homicidal  Orientation:   person, place, time/date and situation  Judgment:   Good  Planning:   Good  Affect:    Anxious  Mood:    Anxious and Depressed  Insight:   Good  Intelligence:   normal  Medical  History:   Past Medical History:  Diagnosis Date  . Arthritis    "joints" (07/13/2018)  . Coronary artery disease    a. s/p CABG 12/2015.  Marland Kitchen CVA (cerebral vascular accident) ([place]) 07/13/2018   right pontine infarct; "said he's had one in the past; probably 2 yrs ago; same symptoms" (07/13/2018)  . Diabetic peripheral neuropathy ([place])   . GERD (gastroesophageal reflux disease)   . Hyperlipidemia   . Hypertension   . Postoperative atrial fibrillation ([place])   . Prostate cancer ([place])    d'x 2015/2016  . TIA (transient ischemic attack)   . Type II diabetes mellitus ([place])    Psychiatric History:  Patient does a history of reactive depression.  Family Med/Psych History:  Family History  Problem Relation Age of Onset  . Heart disease Mother   . Breast cancer Mother   . Leukemia Father   . Diabetes Brother   . Breast cancer Sister   . Stroke Neg Hx     Risk of Suicide/Violence: low Patient denies SI or HI.  Impression/DX:  Andre Wolfe is an 84 year old male with history of HTN, CAD, diabeties, peripheral neuropathy and gait disorder, PAF, CVA with CIR stay in 2019.  Pt readmitted on 11/05/18 with one day history of left facial droop with dysarthria and left facial weakness.  Patient initially refused to come in due to COVID 19 but symptoms did not resolve.  MRI revealed atherosclerosis with chronic severe L-PCA origin stenosis.  MRI also showed recurrent right pontine stroke.  Patient has pas history of depression and anxiety and Prozac was added for reactive depression.  Patient reports today that he has history of depression that has always been associated with life events and reactive in nature.  Reports that depression in not worsening and he is seeing motor function improvements in both leg and arm.    Patient denies any worsening of his depressive symptoms over the past few days and reports that the Prozac is a help and denies signficant increase in symptoms.  Reports that Anxiety has  improved and his comfort in hospital due to fear of COVID 19 has reduced seeing all the precautions staff is taking.  BECK depression Inventory has symptoms in the lower end of mild symptoms.  Disposition/Plan:  Worked on coping and adjustment symptoms.    Diagnosis:    Pontine Stroke with right sided weakness and history of depression        Electronically Signed   _______________________ Ilean Skill, Psy.D.

## 2018-11-17 ENCOUNTER — Inpatient Hospital Stay (HOSPITAL_COMMUNITY): Payer: Medicare Other | Admitting: Occupational Therapy

## 2018-11-17 ENCOUNTER — Inpatient Hospital Stay (HOSPITAL_COMMUNITY): Payer: Medicare Other | Admitting: Physical Therapy

## 2018-11-17 ENCOUNTER — Inpatient Hospital Stay (HOSPITAL_COMMUNITY): Payer: Medicare Other

## 2018-11-17 LAB — GLUCOSE, CAPILLARY
Glucose-Capillary: 114 mg/dL — ABNORMAL HIGH (ref 70–99)
Glucose-Capillary: 94 mg/dL (ref 70–99)
Glucose-Capillary: 98 mg/dL (ref 70–99)
Glucose-Capillary: 109 mg/dL — ABNORMAL HIGH (ref 70–99)

## 2018-11-17 NOTE — Progress Notes (Signed)
Speech Language Pathology Discharge Summary  Patient Details  Name: Andre Wolfe MRN: [id number] Date of Birth: 1935/05/18  Today's Date: 11/17/2018 SLP Individual Time: 1110-1207 SLP Individual Time Calculation (min): 57 min   Skilled Therapeutic Interventions:   Skilled ST services focused on education and cognitive skills. SLP facilitated re-assessment of reasoning and recall skills utilizing subsections of Cognistat, pt demonstrated [facility] for reasoning and mild impairment in short term recall with reducing cuing compared to on evaluation ( pt recalled 1 out 4 words and 4 out 4 with category cues.) SLP facilitated recall of current medication, pt required supervision A verbal cues. SLP facilitated mildly complex problem solving skills utilizing calendar task, pt required supervision A verbal cues for errors. SLP provided education of recall strategies and continued supervision by pt's wife for mildly complex tasks. All questions were answered to satisfaction. Pt was left with call bell within reach and chair alarm set. SLP recommends to continue ST services.    Patient has met 4 of 4 long term goals.  Patient to discharge at overall Modified Independent;Supervision level.  Reasons goals not met:     Clinical Impression/Discharge Summary:   Pt made great progress meeting 4 out 4 goals discharging from ST services at Mod I for speech intelligibility and superversion A for recall and mildly complex problem solving, supported baseline by pt. Pt demonstrated ability to recall new medications with supervision A verbal cues, complete calendar task focused on cross referencing (skill used in comparing bill statement for money management) with supervision A verbal cues.  Pt demonstrated improvement in reasoning and short term recall utilizing subsections of Cognistat, improving reasoning from mild to [facility] and short term recall mild with reduced cuing required. SLP provided education and handout on recall  strategies, all questions answered to satisfaction. Pt appeared to be near baseline, with pt's wife primarly completing complex task such as medication, money and calendar management. Pt benefited from skilled ST services in order to maximize functional independence and reduce burden of care, requiring supervision A upon discharge to home.   Care Partner:  Caregiver Able to Provide Assistance: Yes  Type of Caregiver Assistance: Physical;Cognitive  Recommendation:  24 hour supervision/assistance      Equipment: N/A   Reasons for discharge: Treatment goals met   Patient/Family Agrees with Progress Made and Goals Achieved: Yes    Andre Wolfe 11/17/2018, 12:17 PM

## 2018-11-17 NOTE — Progress Notes (Signed)
Physical Therapy Weekly Progress Note  Patient Details  Name: Andre Wolfe MRN: [id number] Date of Birth: Jun 24, 1935  Beginning of progress report period: November 10, 2018 End of progress report period: Nov 17, 2018   Patient has met 0 of 2 short term goals.  Pt making slow progress due to deficits in balance, postural awareness, reaction time, coordination and strength.  Pt fluctuates between mod/max A for transfers and continues to require +2 assist for gait due to impaired balance.  Patient continues to demonstrate the following deficits muscle weakness, impaired timing and sequencing, abnormal tone, unbalanced muscle activation, decreased coordination and decreased motor planning and decreased sitting balance, decreased standing balance, decreased postural control, hemiplegia and decreased balance strategies and therefore will continue to benefit from skilled PT intervention to increase functional independence with mobility.  Patient progressing toward long term goals..  Continue plan of care.  PT Short Term Goals Week 1:  PT Short Term Goal 1 (Week 1): pt will perform functional transfers with mod A PT Short Term Goal 1 - Progress (Week 1): Progressing toward goal PT Short Term Goal 2 (Week 1): pt will perform gait with mod A x 25' in controlled environment PT Short Term Goal 2 - Progress (Week 1): Progressing toward goal Week 2:  PT Short Term Goal 1 (Week 2): pt will perform gait in controlled environment with mod A x 25' PT Short Term Goal 2 (Week 2): pt will consistently perform transfers at [facility] A  Skilled Therapeutic Interventions/Progress Updates:  Ambulation/gait training;Community reintegration;DME/adaptive equipment instruction;Neuromuscular re-education;Stair training;UE/LE Strength taining/ROM;Wheelchair propulsion/positioning;Balance/vestibular training;Discharge planning;Functional electrical stimulation;Pain management;Therapeutic Activities;UE/LE Coordination  activities;Therapeutic Exercise;Splinting/orthotics;Patient/family education;Functional mobility training;Cognitive remediation/compensation     Therapy/Group: Individual Therapy  [name] 11/17/2018, 8:11 AM

## 2018-11-17 NOTE — Progress Notes (Signed)
Occupational Therapy Session Note  Patient Details  Name: Andre Wolfe MRN: [id number] Date of Birth: 1934/12/03  Today's Date: 11/17/2018 OT Individual Time: 1315-1400 OT Individual Time Calculation (min): 45 min    Short Term Goals: Week 1:  OT Short Term Goal 1 (Week 1): Pt will complete bathing with mod assist at sit > stand level OT Short Term Goal 2 (Week 1): Pt will complete UB dressing with min assist OT Short Term Goal 3 (Week 1): Pt will complete LB dressing (except footwear) with mod assist at sit > stand level OT Short Term Goal 4 (Week 1): Pt will complete toilet transfer with mod assist of one caregiver  Skilled Therapeutic Interventions/Progress Updates:    Pt seen for OT session focusing on functional transfers and neuro re-ed with L UE. Pt sitting up in w/c upon arrival, denying pain and agreeable to tx session. He self propelled w/c throughout unit using B UEs with increased time and supervision.  Addressed toilet transfer goal in ADL apartment using standard toilet and grab bar. Completed x squat pivot transfers w/c> standard toilet. Heavy mod-max A overall with multi-modal cuing for technique. Rest breaks required btwn trials. Educated pt regarding recommendation to cont to use STEDY with nursing staff for safety.  In therapy gym, completed mod-max A squat pivot transfer to therapy mat. Education and practice with reciprocal movements and scooting up in w/c.  Seated EOM, completed fine and gross motor neuro re-ed using L UE. Pt picking up plastic cups, supination/pronation and reaching in diagonal patterns with emphasis on normal movements patterns. Pt returned to w/c and requesting to return to bed at end of session. Mod squat pivot to return to bed and positioned in supine for comfort. All needs in reach and bed alarm on.   Therapy Documentation Precautions:  Precautions Precautions: Fall Restrictions Weight Bearing Restrictions: No   Therapy/Group: Individual  Therapy  Kindred Heying L 11/17/2018, 7:06 AM

## 2018-11-17 NOTE — Progress Notes (Signed)
[facility] PHYSICAL MEDICINE & REHABILITATION PROGRESS NOTE   Subjective/Complaints:  Per OT had a headache yesterday, no c/os this am.  No caths needed, bowels are moving   ROS: Denies CP, shortness of breath, nausea, vomiting, diarrhea.    Objective:   No results found. Recent Labs    11/16/18 0627  WBC 7.2  HGB 15.1  HCT 44.8  PLT 231   Recent Labs    11/16/18 0627  NA 138  K 3.9  CL 105  CO2 25  GLUCOSE 84  BUN 19  CREATININE 1.13  CALCIUM 9.1    Intake/Output Summary (Last 24 hours) at 11/17/2018 0753 Last data filed at 11/16/2018 1836 Gross per 24 hour  Intake 240 ml  Output -  Net 240 ml     Physical Exam: Vital Signs Blood pressure (!) 159/64, pulse (!) 57, temperature 97.9 F (36.6 C), resp. rate 18, height 6' (1.829 m), weight 91.9 kg, SpO2 98 %. Constitutional: No distress . Vital signs reviewed. HENT: Normocephalic.  Atraumatic. Eyes: EOMI.  No discharge. Cardiovascular: No JVD. Respiratory: Normal effort. GI: Non-distended. Musc: No edema or tenderness in extremities. Neurologic: Alert Motor: RUE/RLE: 5/5 proximal distal LUE/LLE: 3+/5 proximal to distal Skin: Warm and dry.  Intact.  Assessment/Plan: 1. Functional deficits secondary to recurrent R pon which require 3+ hours per day of interdisciplinary therapy in a comprehensive inpatient rehab setting.  Physiatrist is providing close team supervision and 24 hour management of active medical problems listed below.  Physiatrist and rehab team continue to assess barriers to discharge/monitor patient progress toward functional and medical goals  Care Tool:  Bathing  Bathing activity did not occur: Refused Body parts bathed by patient: Left arm, Chest, Abdomen, Front perineal area, Right upper leg, Left upper leg, Face   Body parts bathed by helper: Right arm, Buttocks, Right lower leg, Left lower leg     Bathing assist Assist Level: Moderate Assistance - Patient 50 - 74%     Upper Body  Dressing/Undressing Upper body dressing   What is the patient wearing?: Pull over shirt    Upper body assist Assist Level: Moderate Assistance - Patient 50 - 74%    Lower Body Dressing/Undressing Lower body dressing      What is the patient wearing?: Pants     Lower body assist Assist for lower body dressing: Maximal Assistance - Patient 25 - 49%     Toileting Toileting    Toileting assist Assist for toileting: Maximal Assistance - Patient 25 - 49%     Transfers Chair/bed transfer  Transfers assist  Chair/bed transfer activity did not occur: Safety/medical concerns  Chair/bed transfer assist level: Moderate Assistance - Patient 50 - 74%     Locomotion Ambulation   Ambulation assist      Assist level: 2 helpers Assistive device: Walker-rolling Max distance: 20   Walk 10 feet activity   Assist     Assist level: 2 helpers Assistive device: Walker-rolling   Walk 50 feet activity   Assist Walk 50 feet with 2 turns activity did not occur: Safety/medical concerns         Walk 150 feet activity   Assist Walk 150 feet activity did not occur: Safety/medical concerns         Walk 10 feet on uneven surface  activity   Assist Walk 10 feet on uneven surfaces activity did not occur: Safety/medical concerns         Wheelchair     Assist Will  patient use wheelchair at discharge?: Yes Type of Wheelchair: Manual    Wheelchair assist level: Supervision/Verbal cueing Max wheelchair distance: 85'    Wheelchair 50 feet with 2 turns activity    Assist        Assist Level: Supervision/Verbal cueing   Wheelchair 150 feet activity     Assist Wheelchair 150 feet activity did not occur: Safety/medical concerns(Per notes)        Medical Problem List and Plan: 1.Functional deficits and left hemiparesissecondary to right pontine infarct on 11/05/2018  Continue CIR Team conf in am 2.  Antithrombotics: -DVT/anticoagulation:Pharmaceutical:Other (comment)--Eliquis. -antiplatelet therapy: ASA 81 mg 3. Pain Management:N/A 4. Mood:LCSW to follow for evaluation and support. -antipsychotic agents: N/A 5. Neuropsych: This patientiscapable of making decisions on hisown behalf. 6. Skin/Wound Care:routine pressure relief measures. 7. Fluids/Electrolytes/Nutrition:Monitor I/O.  8. Dyslipidemia: On Repatha at home. 9.HTN: Monitor BID. On low dose Metoprolol bid.   Norvasc increased to 5 mg on 5/2 Vitals:   11/16/18 1955 11/17/18 0510  BP: (!) 117/53 (!) 159/64  Pulse: (!) 53 (!) 57  Resp:  18  Temp: 97.8 F (36.6 C) 97.9 F (36.6 C)  SpO2: 99% 98%   Lability but in range 5/5 10. Neurogenic bladder/Urinary retention:   Flomax started on 5/3  Resume I/O caths.  11. Reactive depression: Now on Prozac.  12. T2DM: Monitor BS ac/hs. due to hypoglycemia, Home regimen of 70/30 insulin changed to:  35 units with breakfast   5 units with lunch   12 units with supper    CBG (last 3)  Recent Labs    11/16/18 1637 11/16/18 2146 11/17/18 0635  GLUCAP 143* 158* 114*   Well controlled 5/5 13.  AKI resolved   Creatinine 1.13 on 5/4 BUN 19  Fluid bolus given on 4/29 I 218ml     LOS: 8 days A FACE TO FACE EVALUATION WAS PERFORMED  Charlett Blake 11/17/2018, 7:53 AM

## 2018-11-17 NOTE — Progress Notes (Addendum)
Occupational Therapy Weekly Progress Note  Patient Details  Name: Andre Wolfe MRN: [id number] Date of Birth: 01-26-1935  Beginning of progress report period: November 10, 2018 End of progress report period: Nov 17, 2018  Today's Date: 11/17/2018 OT Individual Time: 6378-5885 OT Individual Time Calculation (min): 60 min    Patient has met 3 of 4 short term goals.  Pt is making steady progress towards goals.  Pt currently requires mod assist for sit > stand and squat pivot transfers to Rt, pt can require up to max assist for squat pivot transfers to Lt.  Pt has demonstrated increased carryover with weight shifting as needed for sit > stand and transfers as needed to complete self-care tasks. Pt overall mod assist for bathing and LB dressing, min assist for UB dressing.  Pt is demonstrating increased functional use of LUE during self-care tasks.    Patient continues to demonstrate the following deficits: muscle weakness, decreased cardiorespiratoy endurance, impaired timing and sequencing, unbalanced muscle activation and decreased coordination, decreased problem solving, decreased safety awareness and decreased memory and decreased standing balance, hemiplegia and decreased balance strategies and therefore will continue to benefit from skilled OT intervention to enhance overall performance with BADL and Reduce care partner burden.  Patient progressing toward long term goals..  Continue plan of care.  OT Short Term Goals Week 1:  OT Short Term Goal 1 (Week 1): Pt will complete bathing with mod assist at sit > stand level OT Short Term Goal 1 - Progress (Week 1): Met OT Short Term Goal 2 (Week 1): Pt will complete UB dressing with min assist OT Short Term Goal 2 - Progress (Week 1): Met OT Short Term Goal 3 (Week 1): Pt will complete LB dressing (except footwear) with mod assist at sit > stand level OT Short Term Goal 3 - Progress (Week 1): Met OT Short Term Goal 4 (Week 1): Pt will complete toilet  transfer with mod assist of one caregiver OT Short Term Goal 4 - Progress (Week 1): Progressing toward goal Week 2:  OT Short Term Goal 1 (Week 2): Pt will complete bathing with min assist at sit > stand level in shower OT Short Term Goal 2 (Week 2): Pt will complete LB dressing, including footwear, with min assist OT Short Term Goal 3 (Week 2): Pt will complete toilet transfers with min assist OT Short Term Goal 4 (Week 2): Pt will complete toileting hygiene with mod assist  Skilled Therapeutic Interventions/Progress Updates:    Treatment session with focus on ADL retraining with bathing and dressing. Completed bed mobility with min-mod assist to come to sitting at EOB due to posterior lean in sitting.  Mod assist squat pivot transfer to w/c with facilitation for weight shift and body mechanics.  Pt declined shower this session, but agreeable to bathing at sink.  Pt utilized LUE as gross to diminished assist during bathing and dressing.  Mod assist for sit > stand and standing balance during LB bathing and dressing.  Pt attempted to utilize BUE to pull pants over hips, however required single UE support on counter while therapist assisted in pulling pants over Lt hip.  Pt able to don gripper socks with increased time for Lt foot and mod assist for Rt foot due to impaired functional use of LUE.  Pt completed grooming tasks in sitting with increased use of LUE when reaching for and opening items.  Pt remained upright in w/c with seat belt alarm on and all needs in  reach.  Therapy Documentation Precautions:  Precautions Precautions: Fall Restrictions Weight Bearing Restrictions: No General:   Vital Signs: Therapy Vitals Temp: 97.9 F (36.6 C) Pulse Rate: (!) 57 Resp: 18 BP: (!) 159/64 Patient Position (if appropriate): Lying Oxygen Therapy SpO2: 98 % O2 Device: Room Air Pain:  Pt with no c/o pain   Therapy/Group: Individual Therapy  [name] 11/17/2018, 7:32 AM

## 2018-11-17 NOTE — Progress Notes (Signed)
Physical Therapy Session Note  Patient Details  Name: Andre Wolfe MRN: [id number] Date of Birth: 1934/10/12  Today's Date: 11/17/2018 PT Individual Time: 0920-1029 PT Individual Time Calculation (min): 69 min   Short Term Goals: Week 2:  PT Short Term Goal 1 (Week 2): pt will perform gait in controlled environment with mod A x 25' PT Short Term Goal 2 (Week 2): pt will consistently perform transfers at [facility] A  Skilled Therapeutic Interventions/Progress Updates:  Session focused on gait training with use of lite gait and treadmill.  Pt performs gait in 2 bouts of 160' each. Pt requires manual facilitation for wt sfhits to Rt, Lt hip translation forward and Lt LE advancement, multimodal cues for timing of steps and posture. Pt continues with bilat knee flexion when fatigued, pt states this occurred prior to admission.  Gait with RW with mod/max A x 25' with use of ace wrap on Lt ankle for DF assist, assist for wt shift, LT LE positioning, posture and sequencing. Pt propels w/c in controlled environment x 100' with supervision, hemi technique. Pt left in w/c with alarm set, needs at hand.  Therapy Documentation Precautions:  Precautions Precautions: Fall Restrictions Weight Bearing Restrictions: No Pain:  no c/o pain   Therapy/Group: Individual Therapy  Kazia Grisanti 11/17/2018, 10:31 AM

## 2018-11-18 ENCOUNTER — Inpatient Hospital Stay (HOSPITAL_COMMUNITY): Payer: Medicare Other | Admitting: Physical Therapy

## 2018-11-18 ENCOUNTER — Inpatient Hospital Stay (HOSPITAL_COMMUNITY): Payer: Medicare Other | Admitting: Occupational Therapy

## 2018-11-18 LAB — GLUCOSE, CAPILLARY
Glucose-Capillary: 94 mg/dL (ref 70–99)
Glucose-Capillary: 157 mg/dL — ABNORMAL HIGH (ref 70–99)
Glucose-Capillary: 74 mg/dL (ref 70–99)
Glucose-Capillary: 169 mg/dL — ABNORMAL HIGH (ref 70–99)

## 2018-11-18 NOTE — Patient Care Conference (Signed)
Inpatient RehabilitationTeam Conference and Plan of Care Update Date: 11/18/2018   Time: 10:30 AM    Patient Name: Andre Wolfe      Medical Record Number: [id number]  Date of Birth: 05/23/1935 Sex: Male         Room/Bed: 4M09C/4M09C-01 Payor Info: Payor: MEDICARE / Plan: MEDICARE PART A AND B / Product Type: *No Product type* /    Admitting Diagnosis: r cva  Admit Date/Time:  11/09/2018  4:24 PM Admission Comments: No comment available   Primary Diagnosis:  <principal problem not specified> Principal Problem: <principal problem not specified>  Patient Active Problem List   Diagnosis Date Noted  . History of depression   . AKI (acute kidney injury) ([place])   . Diabetes mellitus type 2 in nonobese ([facility])   . Hypoglycemia   . Labile blood glucose   . Neurogenic bladder   . Urinary retention   . Bradycardia   . Right pontine stroke ([place]) 07/16/2018  . Acute ischemic stroke ([place])   . Essential hypertension   . Poorly controlled type 2 diabetes mellitus with peripheral neuropathy ([place])   . Tobacco abuse   . PAF (paroxysmal atrial fibrillation) ([place])   . Right pontine CVA ([place]) 07/13/2018  . Hypercholesterolemia 05/04/2018    Expected Discharge Date: Expected Discharge Date: 11/30/18  Team Members Present: Physician leading conference: Dr. Alysia Penna Social Worker Present: Ovidio Kin, LCSW Nurse Present: Leonette Nutting, RN PT Present: Barrie Folk, PT;Rosita Dechalus, PTA OT Present: Darleen Crocker, OT SLP Present: Charolett Bumpers, SLP PPS Coordinator present : Gunnar Fusi     Current Status/Progress Goal Weekly Team Focus  Medical   Blood pressures improving,Mod A mobility, no longer needing ICP  Reduce fall risk  Address labile BP   Bowel/Bladder   Incontinent at times of b/b; q4-6hr PVR for post residual void; LBM: 05/03  regain bladder function & continence; regain regular bowel pattern  assist with tolieting needs PRN;continue q4-6hr PVR and educating on  laxatives/stool softners   Swallow/Nutrition/ Hydration             ADL's   min assist UB dressing, mod assist bathing and LB dressing, mod assist squat pivot transfers  Supervision overall, CGA for dynamic standing balance during self-care tasks and shower transfer  ADL retraining, transfers, dynamic standing balance, LUE NMR   Mobility   mod/max A transfers, gait max A  supervision transfers, min A gait  balance, activity tolerance, gait   Communication             Safety/Cognition/ Behavioral Observations            Pain   no c/o pain  remain pain free  assess pain level qshift and prn    Skin   no skin issues  remain free of new skin break down/infection  assess skin qshift and prn       *See Care Plan and progress notes for long and short-term goals.     Barriers to Discharge  Current Status/Progress Possible Resolutions Date Resolved   Physician    Medical stability     progress but inhibited by pushers and poor sense of balance  D/C SLP to concentrate PT, OT      Nursing                  PT                    OT  SLP                SW                Discharge Planning/Teaching Needs:  Wife plans to hire assist for pt so she will not be the only one providing care. Daughter is involved and can help some.      Team Discussion:  Progressing slowly toward his goals of min assist level. BP trending up and MD adjusting his meds. DM controlled. Have DC Speech due to met goals and back to baseline function. Check ortho BP checks. Voiding now no more I & O cath's but incontinently. Wife to hire assist to help her with his care.  Revisions to Treatment Plan:  DC 5/18    Continued Need for Acute Rehabilitation Level of Care: The patient requires daily medical management by a physician with specialized training in physical medicine and rehabilitation for the following conditions: Daily direction of a multidisciplinary physical rehabilitation program to  ensure safe treatment while eliciting the highest outcome that is of practical value to the patient.: Yes Daily medical management of patient stability for increased activity during participation in an intensive rehabilitation regime.: Yes Daily analysis of laboratory values and/or radiology reports with any subsequent need for medication adjustment of medical intervention for : Neurological problems;Nutritional problems   I attest that I was present, lead the team conference, and concur with the assessment and plan of the team. Teleconference held due to [place], [place] 11/18/2018, 12:07 PM

## 2018-11-18 NOTE — Progress Notes (Signed)
Andre [facility]fe PHYSICAL MEDICINE & REHABILITATION PROGRESS NOTE   Subjective/Complaints:  No issues overnite, denies bowel and bladder issues No dzziness with standing  ROS: Denies CP, shortness of breath, nausea, vomiting, diarrhea.    Objective:   No results found. Recent Labs    11/16/18 0627  WBC 7.2  HGB 15.1  HCT 44.8  PLT 231   Recent Labs    11/16/18 0627  NA 138  K 3.9  CL 105  CO2 25  GLUCOSE 84  BUN 19  CREATININE 1.13  CALCIUM 9.1    Intake/Output Summary (Last 24 hours) at 11/18/2018 0755 Last data filed at 11/17/2018 1847 Gross per 24 hour  Intake 680 ml  Output -  Net 680 ml     Physical Exam: Vital Signs Blood pressure (!) 151/65, pulse (!) 52, temperature 98.3 F (36.8 C), resp. rate 16, height 6' (1.829 m), weight 91.9 kg, SpO2 97 %. Constitutional: No distress . Vital signs reviewed. HENT: Normocephalic.  Atraumatic. Eyes: EOMI.  No discharge. Cardiovascular: No JVD. Respiratory: Normal effort. GI: Non-distended. Musc: No edema or tenderness in extremities. Neurologic: Alert Motor: RUE/RLE: 5/5 proximal distal LUE/LLE: 3+/5 proximal to distal Skin: Warm and dry.  Intact.  Assessment/Plan: 1. Functional deficits secondary to recurrent R pon which require 3+ hours per day of interdisciplinary therapy in a comprehensive inpatient rehab setting.  Physiatrist is providing close team supervision and 24 hour management of active medical problems listed below.  Physiatrist and rehab team continue to assess barriers to discharge/monitor patient progress toward functional and medical goals  Care Tool:  Bathing  Bathing activity did not occur: Refused Body parts bathed by patient: Left arm, Chest, Abdomen, Front perineal area, Right upper leg, Left upper leg, Face, Right lower leg, Left lower leg   Body parts bathed by helper: Right arm, Buttocks     Bathing assist Assist Level: Moderate Assistance - Patient 50 - 74%     Upper Body  Dressing/Undressing Upper body dressing   What is the patient wearing?: Pull over shirt    Upper body assist Assist Level: Minimal Assistance - Patient > 75%    Lower Body Dressing/Undressing Lower body dressing      What is the patient wearing?: Pants     Lower body assist Assist for lower body dressing: Moderate Assistance - Patient 50 - 74%     Toileting Toileting    Toileting assist Assist for toileting: Maximal Assistance - Patient 25 - 49%     Transfers Chair/bed transfer  Transfers assist  Chair/bed transfer activity did not occur: Safety/medical concerns  Chair/bed transfer assist level: Moderate Assistance - Patient 50 - 74%     Locomotion Ambulation   Ambulation assist      Assist level: Maximal Assistance - Patient 25 - 49% Assistive device: Walker-rolling Max distance: 20   Walk 10 feet activity   Assist     Assist level: Maximal Assistance - Patient 25 - 49% Assistive device: Walker-rolling   Walk 50 feet activity   Assist Walk 50 feet with 2 turns activity did not occur: Safety/medical concerns         Walk 150 feet activity   Assist Walk 150 feet activity did not occur: Safety/medical concerns         Walk 10 feet on uneven surface  activity   Assist Walk 10 feet on uneven surfaces activity did not occur: Safety/medical concerns         Wheelchair  Assist Will patient use wheelchair at discharge?: Yes Type of Wheelchair: Manual    Wheelchair assist level: Supervision/Verbal cueing Max wheelchair distance: 30'    Wheelchair 50 feet with 2 turns activity    Assist        Assist Level: Supervision/Verbal cueing   Wheelchair 150 feet activity     Assist Wheelchair 150 feet activity did not occur: Safety/medical concerns(Per notes)        Medical Problem List and Plan: 1.Functional deficits and left hemiparesissecondary to right pontine infarct on 11/05/2018  Continue CIR,Team  conference today please see physician documentation under team conference tab, met with team face-to-face to discuss problems,progress, and goals. Formulized individual treatment plan based on medical history, underlying problem and comorbidities. 2. Antithrombotics: -DVT/anticoagulation:Pharmaceutical:Other (comment)--Eliquis. -antiplatelet therapy: ASA 81 mg 3. Pain Management:N/A 4. Mood:LCSW to follow for evaluation and support. -antipsychotic agents: N/A 5. Neuropsych: This patientiscapable of making decisions on hisown behalf. 6. Skin/Wound Care:routine pressure relief measures. 7. Fluids/Electrolytes/Nutrition:Monitor I/O.  8. Dyslipidemia: On Repatha at home. 9.HTN: Monitor BID. On low dose Metoprolol bid.   Norvasc increased to 5 mg on 5/2 Vitals:   11/17/18 2121 11/18/18 0452  BP: (!) 127/57 (!) 151/65  Pulse: 60 (!) 52  Resp:  16  Temp:  98.3 F (36.8 C)  SpO2:  97%   Lability but in range 5/6, check orthostatics 10. Neurogenic bladder/Urinary retention:   Flomax started on 5/3  Resume I/O caths.  11. Reactive depression: Now on Prozac.  12. T2DM: Monitor BS ac/hs. due to hypoglycemia, Home regimen of 70/30 insulin changed to:  35 units with breakfast   5 units with lunch   12 units with supper    CBG (last 3)  Recent Labs    11/17/18 1632 11/17/18 2123 11/18/18 0641  GLUCAP 98 109* 94   Well controlled 5/6 13.  AKI resolved   Creatinine 1.13 on 5/4 BUN 19       LOS: 9 days A FACE TO [place] Andre Wolfe 11/18/2018, 7:55 AM

## 2018-11-18 NOTE — Progress Notes (Signed)
Physical Therapy Session Note  Patient Details  Name: Andre Wolfe MRN: [id number] Date of Birth: 1935-03-23  Today's Date: 11/18/2018 PT Individual Time: 1115-1230 PT Individual Time Calculation (min): 75 min   Short Term Goals: Week 2:  PT Short Term Goal 1 (Week 2): pt will perform gait in controlled environment with mod A x 25' PT Short Term Goal 2 (Week 2): pt will consistently perform transfers at [facility] A  Skilled Therapeutic Interventions/Progress Updates:    Pt received seated in w/c in room, agreeable to PT session. No complaints of pain. Manual w/c propulsion 2 x 150 ft with use of BUE and RLE, Supervision. Squat pivot transfer w/c to/from mat table with mod A to the R. Sit to stand x 5 reps to RW with min A. Initially pt loses balance posteriorly in standing, is able to correct with each repetition and with multimodal cueing. Sit to stand x 5 reps to RW with 1" step under LLE to encourage weight shift to the R as pt also tends to lean to the L in standing. Pt demos fair ability to increase weight shift to the R with use of 1" step. Standing alt L/R 4" step-taps with RW and mod A for balance, significant lean to the L when standing on RLE and lifting LLE. Pt demos improved awareness of lean but requires max multimodal cueing to correct. Ambulation 3 x 30' with RW and max A, w/c follow for safety, DF assist ACE wrap to LLE. Pt exhibits significant path deviation to the L requiring max A to maintain RW positioning, requires cueing for L knee ext in stance, exhibits narrow BOS and L lateral lean. Pt left seated in w/c in room with needs in reach, quick release belt and chair alarm in place at end of session.  Therapy Documentation Precautions:  Precautions Precautions: Fall Restrictions Weight Bearing Restrictions: No    Therapy/Group: Individual Therapy  Excell Seltzer, PT, DPT  11/18/2018, 1:20 PM

## 2018-11-18 NOTE — Progress Notes (Signed)
Occupational Therapy Session Note  Patient Details  Name: Andre Wolfe MRN: [id number] Date of Birth: Nov 24, 1934  Today's Date: 11/18/2018 OT Individual Time: 9371-6967 and 1315-1415 OT Individual Time Calculation (min): 55 min and 60 min   Short Term Goals: Week 2:  OT Short Term Goal 1 (Week 2): Pt will complete bathing with min assist at sit > stand level in shower OT Short Term Goal 2 (Week 2): Pt will complete LB dressing, including footwear, with min assist OT Short Term Goal 3 (Week 2): Pt will complete toilet transfers with min assist OT Short Term Goal 4 (Week 2): Pt will complete toileting hygiene with mod assist  Skilled Therapeutic Interventions/Progress Updates:    1)  Treatment session with focus on ADL retraining, sit <> stand, and functional use of LUE during bathing/dressing.  Pt received upright in w/c with no c/o pain and agreeable to shower.  Completed shower transfer with [place] due to setup of bathroom.  Pt completed bathing at overall min assist, except mod assist for sit > stand and standing balance while therapist assisted in washing buttocks.  Pt requires UE support for sit > stand and standing balance during shower.  Engaged in dressing with pt demonstrating increased sequencing and problem solving when donning shirt, pants, and socks.  Pt able to thread pants and donned socks with increased time and mod assist for standing balance when pulling pants over hips.  Pt remained upright in w/c with seat belt alarm on and all needs in reach.  2) Treatment session with focus on functional transfers and LUE NMR.  Pt received supine in bed, agreeable to therapy session.  Engaged in transfer training with focus on squat pivot and stand pivot transfers.  Pt completed squat pivot transfer bed > w/c > therapy mat with mod assist when transferring to Rt.  Mod facilitation for anterior weight shift for transfer.  Attempted stand pivot transfers with RW with pt demonstrating increased  ability to complete sit > stand with min-mod assist and then mod assist for weight shift and to keep hips forward during transfer.  Therapist utilized bed sheet for facilitation for hip extension during sit > stand and to maintain hips forward during standing and transfer. Engaged in [place] in sitting with focus on shoulder flexion to grasp and stack poker chips. Pt demonstrating increased manipulation and grasp, still demonstrating difficulty with reaching away from body (flexion and abduction).  Returned to room and transferred back to bed max assist squat pivot to Lt and left semi-reclined in bed with all needs in reach.  Pt continues to demonstrate decreased awareness of deficits and inability to recall and replicate education with functional mobility/transfers.   Therapy Documentation Precautions:  Precautions Precautions: Fall Restrictions Weight Bearing Restrictions: No Pain:  Pt with no c/o pain   Therapy/Group: Individual Therapy  [name] 11/18/2018, 1:14 PM

## 2018-11-19 ENCOUNTER — Inpatient Hospital Stay (HOSPITAL_COMMUNITY): Payer: Medicare Other

## 2018-11-19 ENCOUNTER — Inpatient Hospital Stay (HOSPITAL_COMMUNITY): Payer: Medicare Other | Admitting: Occupational Therapy

## 2018-11-19 LAB — GLUCOSE, CAPILLARY
Glucose-Capillary: 121 mg/dL — ABNORMAL HIGH (ref 70–99)
Glucose-Capillary: 97 mg/dL (ref 70–99)
Glucose-Capillary: 140 mg/dL — ABNORMAL HIGH (ref 70–99)
Glucose-Capillary: 147 mg/dL — ABNORMAL HIGH (ref 70–99)

## 2018-11-19 NOTE — Progress Notes (Signed)
Occupational Therapy Session Note  Patient Details  Name: Andre Wolfe MRN: [id number] Date of Birth: [birth year]  Today's Date: 11/19/2018 OT Individual Time: 4081-4481 and 1420-1500 OT Individual Time Calculation (min): 60 min and 40 min   Short Term Goals: Week 2:  OT Short Term Goal 1 (Week 2): Pt will complete bathing with min assist at sit > stand level in shower OT Short Term Goal 2 (Week 2): Pt will complete LB dressing, including footwear, with min assist OT Short Term Goal 3 (Week 2): Pt will complete toilet transfers with min assist OT Short Term Goal 4 (Week 2): Pt will complete toileting hygiene with mod assist  Skilled Therapeutic Interventions/Progress Updates:    1) Treatment session with focus on sit <> stand, dynamic standing balance, and functional use of LUE as needed for self-care tasks.  Pt received upright in w/c reporting already dressed and deferring bathing to another time.  Engaged in w/c propulsion with BUE and RLE for endurance and functional use of LUE.  Completed squat pivot transfer mod assist to Rt with improved weight shifting.  Engaged in [place] with focus on improved shoulder ROM with reaching and grasp/manipulation when placing clothespins.  Progressed to standing with min-mod assist for sit > stand at pt continues to fluctuate with positioning and carryover of education regarding weight shift due to impaired motor planning.  Continued focus on LUE NMR in standing with increased challenge due to dual task.  LUE fatigues quickly in standing.  Pt reports need to toilet.  Completed stand pivot transfer with RW to toilet with mod faded to min assist and max multimodal cues for sequencing of stepping.  Pt completed sit <> stand with min assist for hygiene, therapist assisting with hygiene for thoroughness after pt completed with lateral leans.  Returned to w/c stand pivot mod assist when transferring to Lt and then left upright in w/c with seat belt alarm on and all  needs in reach.  2) Treatment session with focus on dynamic standing balance with increased reaching outside BOS to further challenge balance for self-care tasks. Pt received supine in bed with no c/o pain and agreeable to therapy session.  Pt completed bed mobility with min assist and stand pivot transfer with RW with mod assist and mod cues for sequencing of step during transfer.  Engaged in sit > stand at high-low table with focus on anterior weight shift and equal WB through BLE in standing.  Placed 1" step under Rt foot to facilitate weight shift to Lt.  Engaged in reaching outside BOS and across midline with pt requiring min-mod assist for standing balance due to decreased balance reactions.  Min assist sit > stand throughout session and min guard with intermittent support at Lt knee during more static standing.  Pt demonstrating increased fine motor control and pinch grip this session.  Returned to room and transferred w/c > bed with RW with mod assist and left semi-reclined in bed with all needs in reach.  Therapy Documentation Precautions:  Precautions Precautions: Fall Restrictions Weight Bearing Restrictions: No General:   Vital Signs: Therapy Vitals Temp: 97.6 F (36.4 C) Temp Source: Oral Pulse Rate: (!) 56 Resp: (!) 22 BP: 131/60 Patient Position (if appropriate): Standing Oxygen Therapy SpO2: 100 % O2 Device: Room Air Pain: Pain Assessment Pain Scale: 0-10 Pain Score: 0-No pain   Therapy/Group: Individual Therapy  [name] 11/19/2018, 3:14 PM

## 2018-11-19 NOTE — Progress Notes (Signed)
[facility] PHYSICAL MEDICINE & REHABILITATION PROGRESS NOTE   Subjective/Complaints:  No issues overnite, discussed d/c date and BP  ROS: Denies CP, shortness of breath, nausea, vomiting, diarrhea.    Objective:   No results found. No results for input(s): WBC, HGB, HCT, PLT in the last 72 hours. No results for input(s): NA, K, CL, CO2, GLUCOSE, BUN, CREATININE, CALCIUM in the last 72 hours.  Intake/Output Summary (Last 24 hours) at 11/19/2018 0737 Last data filed at 11/18/2018 1809 Gross per 24 hour  Intake 600 ml  Output 232 ml  Net 368 ml     Physical Exam: Vital Signs Blood pressure (!) 141/57, pulse (!) 50, temperature 97.6 F (36.4 C), resp. rate 17, height 6' (1.829 m), weight 91.9 kg, SpO2 97 %. Constitutional: No distress . Vital signs reviewed. HENT: Normocephalic.  Atraumatic. Eyes: EOMI.  No discharge. Cardiovascular: No JVD. Respiratory: Normal effort. GI: Non-distended. Musc: No edema or tenderness in extremities. Neurologic: Alert Motor: RUE/RLE: 5/5 proximal distal LUE/LLE: 3+/5 proximal to distal Skin: Warm and dry.  Intact.  Assessment/Plan: 1. Functional deficits secondary to recurrent R pon which require 3+ hours per day of interdisciplinary therapy in a comprehensive inpatient rehab setting.  Physiatrist is providing close team supervision and 24 hour management of active medical problems listed below.  Physiatrist and rehab team continue to assess barriers to discharge/monitor patient progress toward functional and medical goals  Care Tool:  Bathing  Bathing activity did not occur: Refused Body parts bathed by patient: Left arm, Chest, Abdomen, Front perineal area, Right upper leg, Left upper leg, Face, Right lower leg, Left lower leg   Body parts bathed by helper: Right arm, Buttocks     Bathing assist Assist Level: Moderate Assistance - Patient 50 - 74%     Upper Body Dressing/Undressing Upper body dressing   What is the patient  wearing?: Pull over shirt    Upper body assist Assist Level: Supervision/Verbal cueing    Lower Body Dressing/Undressing Lower body dressing      What is the patient wearing?: Pants     Lower body assist Assist for lower body dressing: Minimal Assistance - Patient > 75%     Toileting Toileting    Toileting assist Assist for toileting: Maximal Assistance - Patient 25 - 49%     Transfers Chair/bed transfer  Transfers assist  Chair/bed transfer activity did not occur: Safety/medical concerns  Chair/bed transfer assist level: Moderate Assistance - Patient 50 - 74%     Locomotion Ambulation   Ambulation assist      Assist level: Maximal Assistance - Patient 25 - 49% Assistive device: Walker-rolling Max distance: 30'   Walk 10 feet activity   Assist     Assist level: Maximal Assistance - Patient 25 - 49% Assistive device: Walker-rolling   Walk 50 feet activity   Assist Walk 50 feet with 2 turns activity did not occur: Safety/medical concerns         Walk 150 feet activity   Assist Walk 150 feet activity did not occur: Safety/medical concerns         Walk 10 feet on uneven surface  activity   Assist Walk 10 feet on uneven surfaces activity did not occur: Safety/medical concerns         Wheelchair     Assist Will patient use wheelchair at discharge?: Yes Type of Wheelchair: Manual    Wheelchair assist level: Supervision/Verbal cueing Max wheelchair distance: 150'    Wheelchair 50 feet with  2 turns activity    Assist        Assist Level: Supervision/Verbal cueing   Wheelchair 150 feet activity     Assist Wheelchair 150 feet activity did not occur: Safety/medical concerns(Per notes)   Assist Level: Supervision/Verbal cueing    Medical Problem List and Plan: 1.Functional deficits and left hemiparesissecondary to right pontine infarct on 11/05/2018  Continue CIR,on target for D/C 5/18 2.  Antithrombotics: -DVT/anticoagulation:Pharmaceutical:Other (comment)--Eliquis. -antiplatelet therapy: ASA 81 mg 3. Pain Management:N/A 4. Mood:LCSW to follow for evaluation and support. -antipsychotic agents: N/A 5. Neuropsych: This patientiscapable of making decisions on hisown behalf. 6. Skin/Wound Care:routine pressure relief measures. 7. Fluids/Electrolytes/Nutrition:Monitor I/O.  8. Dyslipidemia: On Repatha at home. 9.HTN: Monitor BID. On low dose Metoprolol bid.   Norvasc increased to 5 mg on 5/2 Vitals:   11/18/18 1958 11/19/18 0539  BP: (!) 128/52 (!) 141/57  Pulse: 63 (!) 50  Resp: 18 17  Temp: 98 F (36.7 C) 97.6 F (36.4 C)  SpO2: 100% 97%   Improving cont to monitor, sit to stand 42mmHg drop 10. Neurogenic bladder/Urinary retention:   Flomax started on 5/3  Resume I/O caths.  11. Reactive depression: Now on Prozac.  12. T2DM: Monitor BS ac/hs. due to hypoglycemia, Home regimen of 70/30 insulin changed to:  35 units with breakfast   5 units with lunch   12 units with supper    CBG (last 3)  Recent Labs    11/18/18 1628 11/18/18 2134 11/19/18 0641  GLUCAP 74 169* 121*   Well controlled 5/6 13.  AKI resolved   Creatinine 1.13 on 5/4 BUN 19       LOS: 10 days A FACE TO [facility] E Kirsteins 11/19/2018, 7:37 AM

## 2018-11-19 NOTE — Progress Notes (Signed)
Physical Therapy Session Note  Patient Details  Name: Andre Wolfe MRN: [id number] Date of Birth: 01-Dec-1934  Today's Date: 11/19/2018 PT Individual Time: 1000-1045 PT Individual Time Calculation (min): 45 min   Short Term Goals: Week 2:  PT Short Term Goal 1 (Week 2): pt will perform gait in controlled environment with mod A x 25' PT Short Term Goal 2 (Week 2): pt will consistently perform transfers at [facility] A  Skilled Therapeutic Interventions/Progress Updates:    Session focused on NMR to address transitional movements during sit <> stands, postural control retraining in standing and during gait, and LLE motor activation and weightshifting to the R during gait trials. Pt requires min assist for sit <> stands with focus on sequence and anterior weightshift (mod assist for descent a few reps when pt with posterior LOB occurs) and focused on sit <> stand without placing LUE on walker until in standing and removing LUE prior to descent and managing velcro strap. During gait trial utilized ACE wrap for DF assist and then used foot-up brace (pt reports having one from prior CVA but did not wear it). No correct sized AFO's available to trial. Pt fluctuating from min assist up to max assist (x 25', x 25') as L lateral lean or posterior lean increases as fatigue. Pt demonstrating improved upright posture and bilateral LE and trunk extension with multimodal cues. Focused on pre-gait stepping with LLE blocked practice with min assist with facilitation for weightshift to the R.   Therapy Documentation Precautions:  Precautions Precautions: Fall Restrictions Weight Bearing Restrictions: No  Pain:  No complaints of pain.     Therapy/Group: Individual Therapy  Canary Brim Ivory Broad, PT, DPT, CBIS  11/19/2018, 10:53 AM

## 2018-11-19 NOTE — Progress Notes (Signed)
Social Work Patient ID: Andre Wolfe, male   DOB: 1935/01/27, 84 y.o.   MRN: [id number] Met with pt and spoke daughter-Becky to inform both of team conference progress toward his goals of min-supervision and discharge date 5/18. Discussed having wife and daughter come in next week to go through therapies with him to see if this is managable at home with wife. They have a private duty list regarding hiring assist. Wife has health issues and can not provide 24 hr care. Will send daughter NH list also. Work toward best plan for all of them.

## 2018-11-19 NOTE — Progress Notes (Addendum)
Physical Therapy Session Note  Patient Details  Name: Andre Wolfe MRN: [id number] Date of Birth: Mar 13, 1935  Today's Date: 11/19/2018 PT Individual Time: 1145-1230 PT Individual Time Calculation (min): 45 min   Short Term Goals: Week 1:  PT Short Term Goal 1 (Week 1): pt will perform functional transfers with mod A PT Short Term Goal 1 - Progress (Week 1): Progressing toward goal PT Short Term Goal 2 (Week 1): pt will perform gait with mod A x 25' in controlled environment PT Short Term Goal 2 - Progress (Week 1): Progressing toward goal Week 2:  PT Short Term Goal 1 (Week 2): pt will perform gait in controlled environment with mod A x 25' PT Short Term Goal 2 (Week 2): pt will consistently perform transfers at [facility] A      Skilled Therapeutic Interventions/Progress Updates:   Pt seated in w/c.  Slide board transfer to L , level transfer, with min assist.  Pt elevated hips several times.  Therapeutic activity seated with bil LE support, reaching out of BOS with either hand, to facilitate trunk shortening/lengthening/rotating.  Sit> stand with min assist.  Biased- to- L standing during reaching for playing cards with R hand and place on board in front of him.  With first card, pt leaned forward and L with complete LOB requiring total assist to regain.g  Pt stated that he knew that he was losing his balance.   Set up the activity again, with pt intermittently placing R or L hand on a surface in between cards, with no LOB, accurately placing 9/9 cards.  neuromuscular re-education via via demo, forced use and multimodal cues for L/R lateral leans, and L foot alignment with knee and hip prior to sit> stand..  Gait training using R rail, ACE on L foot for foot drop, x 25' iwht min assist, min cues for L knee stability with wt bearing.  At end of session, pt remained sitting in w/c with tray table in front of him, needs at hand and seat belt alarm set.     Therapy Documentation Precautions:   Precautions Precautions: Fall Restrictions Weight Bearing Restrictions: No Pain: Pain Assessment Pain Scale: 0-10 Pain Score: 0-No pain      Therapy/Group: Individual Therapy  Andre Wolfe 11/19/2018, 12:40 PM

## 2018-11-20 ENCOUNTER — Inpatient Hospital Stay (HOSPITAL_COMMUNITY): Payer: Medicare Other | Admitting: Occupational Therapy

## 2018-11-20 ENCOUNTER — Inpatient Hospital Stay (HOSPITAL_COMMUNITY): Payer: Medicare Other | Admitting: Physical Therapy

## 2018-11-20 LAB — GLUCOSE, CAPILLARY
Glucose-Capillary: 109 mg/dL — ABNORMAL HIGH (ref 70–99)
Glucose-Capillary: 112 mg/dL — ABNORMAL HIGH (ref 70–99)
Glucose-Capillary: 68 mg/dL — ABNORMAL LOW (ref 70–99)
Glucose-Capillary: 79 mg/dL (ref 70–99)
Glucose-Capillary: 192 mg/dL — ABNORMAL HIGH (ref 70–99)

## 2018-11-20 NOTE — Progress Notes (Signed)
Physical Therapy Session Note  Patient Details  Name: Andre Wolfe MRN: [id number] Date of Birth: 11-21-1934  Today's Date: 11/20/2018 PT Individual Time: 8185-6314 PT Individual Time Calculation (min): 40 min   Short Term Goals: Week 2:  PT Short Term Goal 1 (Week 2): pt will perform gait in controlled environment with mod A x 25' PT Short Term Goal 2 (Week 2): pt will consistently perform [facility] at [facility] A  Skilled Therapeutic Interventions/Progress Updates:    pt performs w/c mobility throughout unit in controlled environment with supervision, cues for posture 200' x 2.  Pt performs standing balance with attempting to switch laundry. Pt requires max/total A for balance with 1 UE support due to pt losing balance to Lt and bilat knee buckling, max A to correct. Pt unable to reach into washer without bilat knees buckling despite multiple attempts.  Gait training with RW with mod A for controlled environment, max A for safety with turning to sit.  In depth discussion of d/c plans and PT recommendation that pt be w/c level at home.  Pt states he cannot get into bathroom with w/c, social worker made aware, pt aware of recommendation for hired helper to assist with bathing.    Therapy Documentation Precautions:  Precautions Precautions: Fall Restrictions Weight Bearing Restrictions: No Pain: Pain Assessment Pain Scale: 0-10 Pain Score: 0-No pain   Therapy/Group: Individual Therapy  Andre Wolfe 11/20/2018, 11:30 AM

## 2018-11-20 NOTE — Progress Notes (Signed)
Occupational Therapy Session Note  Patient Details  Name: Andre Wolfe MRN: [id number] Date of Birth: [birth year]  Today's Date: 11/20/2018 OT Individual Time: 7169-6789 OT Individual Time Calculation (min): 55 min    Short Term Goals: Week 2:  OT Short Term Goal 1 (Week 2): Pt will complete bathing with min assist at sit > stand level in shower OT Short Term Goal 2 (Week 2): Pt will complete LB dressing, including footwear, with min assist OT Short Term Goal 3 (Week 2): Pt will complete toilet transfers with min assist OT Short Term Goal 4 (Week 2): Pt will complete toileting hygiene with mod assist  Skilled Therapeutic Interventions/Progress Updates:    Treatment session with focus on ADL retraining and functional transfers.  Pt received supine in bed reporting had been incontinent of bowel.  Engaged in hygiene at bed level with pt rolling Rt and Lt with CGA.  Completed bed mobility to EOB with min assist.  Once upright completed stand pivot transfer bed > w/c with RW and mod assist.  Therapist providing multimodal cues and facilitation for weight shifting due to posterior bias in standing.  Engaged in [place] bathing and grooming tasks in sitting at sink with use of LUE as gross to diminished level.  Pt reports need to toilet again.  Completed stand pivot transfer w/c > toilet with RW with mod assist and multimodal cues for transfer.  Pt attempted hygiene with lateral leans in sitting, however required assist for thoroughness.  Donned pants post toileting with assist to thread LLE and assist to pull pants over hips in standing.  Pt with improved standing posture with UE support on RW.  Remained upright in w/c with seat belt alarm on and all needs in reach.  Therapy Documentation Precautions:  Precautions Precautions: Fall Restrictions Weight Bearing Restrictions: No Pain:  Pt with no c/o pain   Therapy/Group: Individual Therapy  [name] 11/20/2018, 12:30 PM

## 2018-11-20 NOTE — Progress Notes (Signed)
Occupational Therapy Session Note  Patient Details  Name: Andre Wolfe MRN: [id number] Date of Birth: March 08, 1935  Today's Date: 11/20/2018 OT Individual Time: 1300-1330 OT Individual Time Calculation (min): 30 min    Short Term Goals: Week 2:  OT Short Term Goal 1 (Week 2): Pt will complete bathing with min assist at sit > stand level in shower OT Short Term Goal 2 (Week 2): Pt will complete LB dressing, including footwear, with min assist OT Short Term Goal 3 (Week 2): Pt will complete toilet transfers with min assist OT Short Term Goal 4 (Week 2): Pt will complete toileting hygiene with mod assist  Skilled Therapeutic Interventions/Progress Updates:    Patient seated in w/c and ready for therapy.   Patient able to reach into dryer to retrieve clothing with min a.  Folding task with mod a - able to hold items and orient them with left hand but folding continues to be difficult.  Seated and standing posture activities with min a.  Sit to stand from w/ with min A.  SPT w/c to bed mod A for control of left LE.  SSP to supine min A.  Patient in bed at close of session with bed alarm set and call bell/tray table in reach.    Therapy Documentation Precautions:  Precautions Precautions: Fall Restrictions Weight Bearing Restrictions: No General:   Vital Signs: Therapy Vitals Temp: 98.2 F (36.8 C) Pulse Rate: (!) 55 Resp: 16 BP: 137/63 Patient Position (if appropriate): Lying Oxygen Therapy SpO2: 100 % O2 Device: Room Air Pain: Pain Assessment Pain Scale: 0-10 Pain Score: 0-No pain Other Treatments:     Therapy/Group: Individual Therapy  Carlos Levering 11/20/2018, 3:33 PM

## 2018-11-20 NOTE — Progress Notes (Signed)
Hypoglycemic Event  CBG: 68  Treatment: Ate dinner  Symptoms: None  Follow-up CBG: Time:1805 CBG Result:79  Possible Reasons for Event: unknown  Comments/MD notified:Dr. Otto Herb, [place] Pai

## 2018-11-20 NOTE — Progress Notes (Signed)
[place] PHYSICAL MEDICINE & REHABILITATION PROGRESS NOTE   Subjective/Complaints:  No issues overnite, discussed BP management with permissive HTN, intracranial stenosis  ROS: Denies CP, shortness of breath, nausea, vomiting, diarrhea.    Objective:   No results found. No results for input(s): WBC, HGB, HCT, PLT in the last 72 hours. No results for input(s): NA, K, CL, CO2, GLUCOSE, BUN, CREATININE, CALCIUM in the last 72 hours.  Intake/Output Summary (Last 24 hours) at 11/20/2018 0739 Last data filed at 11/19/2018 1841 Gross per 24 hour  Intake 720 ml  Output -  Net 720 ml     Physical Exam: Vital Signs Blood pressure (!) 150/68, pulse (!) 54, temperature 98.9 F (37.2 C), resp. rate 18, height 6' (1.829 m), weight 91.9 kg, SpO2 100 %. Constitutional: No distress . Vital signs reviewed. HENT: Normocephalic.  Atraumatic. Eyes: EOMI.  No discharge. Cardiovascular: No JVD. Respiratory: Normal effort. GI: Non-distended. Musc: No edema or tenderness in extremities. Neurologic: Alert Motor: RUE/RLE: 5/5 proximal distal LUE/LLE: 3+/5 proximal to distal Skin: Warm and dry.  Intact.  Assessment/Plan: 1. Functional deficits secondary to recurrent R pon which require 3+ hours per day of interdisciplinary therapy in a comprehensive inpatient rehab setting.  Physiatrist is providing close team supervision and 24 hour management of active medical problems listed below.  Physiatrist and rehab team continue to assess barriers to discharge/monitor patient progress toward functional and medical goals  Care Tool:  Bathing  Bathing activity did not occur: Refused Body parts bathed by patient: Left arm, Chest, Abdomen, Front perineal area, Right upper leg, Left upper leg, Face, Right lower leg, Left lower leg   Body parts bathed by helper: Right arm, Buttocks     Bathing assist Assist Level: Moderate Assistance - Patient 50 - 74%     Upper Body Dressing/Undressing Upper body  dressing   What is the patient wearing?: Pull over shirt    Upper body assist Assist Level: Supervision/Verbal cueing    Lower Body Dressing/Undressing Lower body dressing      What is the patient wearing?: Pants     Lower body assist Assist for lower body dressing: Minimal Assistance - Patient > 75%     Toileting Toileting    Toileting assist Assist for toileting: Maximal Assistance - Patient 25 - 49%     Transfers Chair/bed transfer  Transfers assist  Chair/bed transfer activity did not occur: Safety/medical concerns  Chair/bed transfer assist level: Minimal Assistance - Patient > 75%     Locomotion Ambulation   Ambulation assist      Assist level: Minimal Assistance - Patient > 75% Assistive device: Orthosis(hand rail on R) Max distance: 25   Walk 10 feet activity   Assist     Assist level: Minimal Assistance - Patient > 75% Assistive device: Walker-rolling   Walk 50 feet activity   Assist Walk 50 feet with 2 turns activity did not occur: Safety/medical concerns         Walk 150 feet activity   Assist Walk 150 feet activity did not occur: Safety/medical concerns         Walk 10 feet on uneven surface  activity   Assist Walk 10 feet on uneven surfaces activity did not occur: Safety/medical concerns         Wheelchair     Assist Will patient use wheelchair at discharge?: Yes Type of Wheelchair: Manual    Wheelchair assist level: Supervision/Verbal cueing Max wheelchair distance: 150'    Wheelchair 50  feet with 2 turns activity    Assist        Assist Level: Supervision/Verbal cueing   Wheelchair 150 feet activity     Assist Wheelchair 150 feet activity did not occur: Safety/medical concerns(Per notes)   Assist Level: Supervision/Verbal cueing    Medical Problem List and Plan: 1.Functional deficits and left hemiparesissecondary to right pontine infarct on 11/05/2018  Continue CIR,on target for D/C  5/18 2. Antithrombotics: -DVT/anticoagulation:Pharmaceutical:Other (comment)--Eliquis. -antiplatelet therapy: ASA 81 mg 3. Pain Management:N/A 4. Mood:LCSW to follow for evaluation and support. -antipsychotic agents: N/A 5. Neuropsych: This patientiscapable of making decisions on hisown behalf. 6. Skin/Wound Care:routine pressure relief measures. 7. Fluids/Electrolytes/Nutrition:Monitor I/O.  8. Dyslipidemia: On Repatha at home. 9.HTN: Monitor BID. On low dose Metoprolol bid.   Norvasc increased to 5 mg on 5/2 Vitals:   11/19/18 1916 11/20/18 0522  BP: (!) 159/63 (!) 150/68  Pulse: 61 (!) 54  Resp: 17 18  Temp: 98.3 F (36.8 C) 98.9 F (37.2 C)  SpO2: 98% 100%   Elevated but given widespread intracranial stenosis will allow sys BPs to remain mildly elevated <139mmHg  10. Neurogenic bladder/Urinary retention:   Flomax started on 5/3  Resume I/O caths.  11. Reactive depression: Now on Prozac.  12. T2DM: Monitor BS ac/hs. due to hypoglycemia, Home regimen of 70/30 insulin changed to:  35 units with breakfast   5 units with lunch   12 units with supper    CBG (last 3)  Recent Labs    11/19/18 1639 11/19/18 2134 11/20/18 0630  GLUCAP 140* 147* 109*   Well controlled 5/8 13.  AKI resolved   Creatinine 1.13 on 5/4 BUN 19       LOS: 11 days A FACE TO FACE EVALUATION WAS PERFORMED  Charlett Blake 11/20/2018, 7:39 AM

## 2018-11-20 NOTE — Progress Notes (Signed)
Occupational Therapy Session Note  Patient Details  Name: Andre Wolfe MRN: [id number] Date of Birth: [birth year]  Today's Date: 11/20/2018 OT Individual Time: 1030-1130 OT Individual Time Calculation (min): 60 min    Short Term Goals: Week 1:  OT Short Term Goal 1 (Week 1): Pt will complete bathing with mod assist at sit > stand level OT Short Term Goal 1 - Progress (Week 1): Met OT Short Term Goal 2 (Week 1): Pt will complete UB dressing with min assist OT Short Term Goal 2 - Progress (Week 1): Met OT Short Term Goal 3 (Week 1): Pt will complete LB dressing (except footwear) with mod assist at sit > stand level OT Short Term Goal 3 - Progress (Week 1): Met OT Short Term Goal 4 (Week 1): Pt will complete toilet transfer with mod assist of one caregiver OT Short Term Goal 4 - Progress (Week 1): Progressing toward goal Week 2:  OT Short Term Goal 1 (Week 2): Pt will complete bathing with min assist at sit > stand level in shower OT Short Term Goal 2 (Week 2): Pt will complete LB dressing, including footwear, with min assist OT Short Term Goal 3 (Week 2): Pt will complete toilet transfers with min assist OT Short Term Goal 4 (Week 2): Pt will complete toileting hygiene with mod assist  Skilled Therapeutic Interventions/Progress Updates:    Pt seen this session for LUE NMR and sit to stand/ standing balance.  Pt received in w/c and agreeable to therapy. Pt taken to gym and then he transferred to mat with min A squat pivot.  From mat, pt worked on numerous activities to facilitate arom in LUE.  He used UE ranger and hockey stick to shoulder stretch and sh flex AROM.  Hula hoop as a steering wheel for grasp with sh flex/add.  Grasp and release of laundry basket with B hands and then placing bean bags in laundry basket one at a time and picking up basket in between.  Holding small gym ball in B hands and rotating it for forearms pronation/ supination.   Sit to stand without UE support from mat,   Stand to partial squat 5x in a row.  Standing with wt shifting to R to avoid L lean all with min A.  Stand pivot back to w/c to R with min A.    Pt participated extremely well.  Taken back to room with belt alarm on and all needs met.    Therapy Documentation Precautions:  Precautions Precautions: Fall Restrictions Weight Bearing Restrictions: No    Vital Signs: Therapy Vitals Temp: 98.9 F (37.2 C) Pulse Rate: (!) 54 Resp: 18 BP: (!) 150/68 Patient Position (if appropriate): Lying Oxygen Therapy SpO2: 100 % O2 Device: Room Air Pain: Pain Assessment Pain Scale: 0-10 Pain Score: 0-No pain   Therapy/Group: Individual Therapy  [place] 11/20/2018, 8:31 AM

## 2018-11-21 ENCOUNTER — Inpatient Hospital Stay (HOSPITAL_COMMUNITY): Payer: Medicare Other

## 2018-11-21 LAB — GLUCOSE, CAPILLARY
Glucose-Capillary: 110 mg/dL — ABNORMAL HIGH (ref 70–99)
Glucose-Capillary: 140 mg/dL — ABNORMAL HIGH (ref 70–99)
Glucose-Capillary: 125 mg/dL — ABNORMAL HIGH (ref 70–99)
Glucose-Capillary: 117 mg/dL — ABNORMAL HIGH (ref 70–99)

## 2018-11-21 NOTE — Progress Notes (Signed)
[place] PHYSICAL MEDICINE & REHABILITATION PROGRESS NOTE   Subjective/Complaints:  No new problems. Slept well.   ROS: Patient denies fever, rash, sore throat, blurred vision, nausea, vomiting, diarrhea, cough, shortness of breath or chest pain, joint or back pain, headache, or mood change.   Objective:   No results found. No results for input(s): WBC, HGB, HCT, PLT in the last 72 hours. No results for input(s): NA, K, CL, CO2, GLUCOSE, BUN, CREATININE, CALCIUM in the last 72 hours.  Intake/Output Summary (Last 24 hours) at 11/21/2018 0907 Last data filed at 11/20/2018 1900 Gross per 24 hour  Intake 360 ml  Output -  Net 360 ml     Physical Exam: Vital Signs Blood pressure (!) 168/72, pulse (!) 54, temperature 98.3 F (36.8 C), temperature source Oral, resp. rate 16, height 6' (1.829 m), weight 91.9 kg, SpO2 100 %. Constitutional: No distress . Vital signs reviewed. HEENT: EOMI, oral membranes moist Neck: supple Cardiovascular: RRR without murmur. No JVD    Respiratory: CTA Bilaterally without wheezes or rales. Normal effort    GI: BS +, non-tender, non-distended  Musc: No edema or tenderness in extremities. Neurologic: Alert Motor: RUE/RLE: 5/5 proximal distal LUE/LLE: 3+/5 proximal to distal Skin: Warm and dry.  Intact.  Assessment/Plan: 1. Functional deficits secondary to recurrent R pon which require 3+ hours per day of interdisciplinary therapy in a comprehensive inpatient rehab setting.  Physiatrist is providing close team supervision and 24 hour management of active medical problems listed below.  Physiatrist and rehab team continue to assess barriers to discharge/monitor patient progress toward functional and medical goals  Care Tool:  Bathing  Bathing activity did not occur: Refused Body parts bathed by patient: Left arm, Chest, Abdomen, Front perineal area, Right upper leg, Left upper leg, Face, Right lower leg, Left lower leg   Body parts bathed by  helper: Right arm, Buttocks     Bathing assist Assist Level: Moderate Assistance - Patient 50 - 74%     Upper Body Dressing/Undressing Upper body dressing   What is the patient wearing?: Pull over shirt    Upper body assist Assist Level: Supervision/Verbal cueing    Lower Body Dressing/Undressing Lower body dressing      What is the patient wearing?: Pants     Lower body assist Assist for lower body dressing: Minimal Assistance - Patient > 75%     Toileting Toileting    Toileting assist Assist for toileting: Maximal Assistance - Patient 25 - 49%     Transfers Chair/bed transfer  Transfers assist  Chair/bed transfer activity did not occur: Safety/medical concerns  Chair/bed transfer assist level: Minimal Assistance - Patient > 75%     Locomotion Ambulation   Ambulation assist      Assist level: Minimal Assistance - Patient > 75% Assistive device: Orthosis(hand rail on R) Max distance: 25   Walk 10 feet activity   Assist     Assist level: Minimal Assistance - Patient > 75% Assistive device: Walker-rolling   Walk 50 feet activity   Assist Walk 50 feet with 2 turns activity did not occur: Safety/medical concerns         Walk 150 feet activity   Assist Walk 150 feet activity did not occur: Safety/medical concerns         Walk 10 feet on uneven surface  activity   Assist Walk 10 feet on uneven surfaces activity did not occur: Safety/medical concerns         Wheelchair  Assist Will patient use wheelchair at discharge?: Yes Type of Wheelchair: Manual    Wheelchair assist level: Supervision/Verbal cueing Max wheelchair distance: 150'    Wheelchair 50 feet with 2 turns activity    Assist        Assist Level: Supervision/Verbal cueing   Wheelchair 150 feet activity     Assist Wheelchair 150 feet activity did not occur: Safety/medical concerns(Per notes)   Assist Level: Supervision/Verbal cueing     Medical Problem List and Plan: 1.Functional deficits and left hemiparesissecondary to right pontine infarct on 11/05/2018  Continue CIR,on target for D/C 5/18  2. Antithrombotics: -DVT/anticoagulation:Pharmaceutical:Other (comment)--Eliquis. -antiplatelet therapy: ASA 81 mg 3. Pain Management:N/A 4. Mood:LCSW to follow for evaluation and support. -antipsychotic agents: N/A 5. Neuropsych: This patientiscapable of making decisions on hisown behalf. 6. Skin/Wound Care:routine pressure relief measures. 7. Fluids/Electrolytes/Nutrition:Monitor I/O.  8. Dyslipidemia: On Repatha at home. 9.HTN: Monitor BID. On low dose Metoprolol bid.   Norvasc increased to 5 mg on 5/2 Vitals:   11/21/18 0619 11/21/18 0622  BP: (!) 143/69 (!) 168/72  Pulse: (!) 51 (!) 54  Resp: 16   Temp: 98.3 F (36.8 C)   SpO2: 100% 100%   Elevated but given widespread intracranial stenosis will allow sys BPs to remain mildly elevated <142mmHg---no change in plan   10. Neurogenic bladder/Urinary retention:   Flomax started on 5/3  Resume I/O caths.  11. Reactive depression: Now on Prozac.  12. T2DM: Monitor BS ac/hs. due to hypoglycemia, Home regimen of 70/30 insulin changed to:  35 units with breakfast   5 units with lunch   12 units with supper    CBG (last 3)  Recent Labs    11/20/18 1805 11/20/18 2106 11/21/18 0645  GLUCAP 79 192* 110*   Reasonable control 5/9 13.  AKI resolved   Creatinine 1.13 on 5/4 BUN 19       LOS: 12 days A FACE TO [place] 11/21/2018, 9:07 AM

## 2018-11-21 NOTE — Progress Notes (Signed)
Occupational Therapy Session Note  Patient Details  Name: Andre Wolfe MRN: [id number] Date of Birth: [birth year]  Today's Date: 11/21/2018 OT Individual Time: 1515-1600 OT Individual Time Calculation (min): 45 min    Short Term Goals: Week 1:  OT Short Term Goal 1 (Week 1): Pt will complete bathing with mod assist at sit > stand level OT Short Term Goal 1 - Progress (Week 1): Met OT Short Term Goal 2 (Week 1): Pt will complete UB dressing with min assist OT Short Term Goal 2 - Progress (Week 1): Met OT Short Term Goal 3 (Week 1): Pt will complete LB dressing (except footwear) with mod assist at sit > stand level OT Short Term Goal 3 - Progress (Week 1): Met OT Short Term Goal 4 (Week 1): Pt will complete toilet transfer with mod assist of one caregiver OT Short Term Goal 4 - Progress (Week 1): Progressing toward goal  Skilled Therapeutic Interventions/Progress Updates:    1:1. Pt received in bed with no report of pain. Pt completes squat pivo ttransfer to w/c with MIN A. Pt completes dowel rod HEP with 3# dowel rod: shoulder flex/ext, press, chest press, elbow flex/ext, overhead press and circles forward and backward for global UE strengthening and visual feedback of dowel rod sinking when LUE fatigues. Pt completes 2x10 towel glides up wall with tactile cues at scapula to facilitate top ROM of shoulder flexion and abduction seated in w/c. Exited session with pt returned back to bed for NT to bladder scan with exit alarm on and call light in reach  Therapy Documentation Precautions:  Precautions Precautions: Fall Restrictions Weight Bearing Restrictions: No General:   Vital Signs: Therapy Vitals Temp: (!) 97.3 F (36.3 C) Temp Source: Oral Pulse Rate: (!) 51 Resp: 18 BP: (!) 153/57 Patient Position (if appropriate): Lying Oxygen Therapy SpO2: 100 % O2 Device: Room Air Pain:   ADL:   Vision   Perception    Praxis   Exercises:   Other Treatments:      Therapy/Group: Individual Therapy  [name] 11/21/2018, 3:30 PM

## 2018-11-22 LAB — GLUCOSE, CAPILLARY
Glucose-Capillary: 117 mg/dL — ABNORMAL HIGH (ref 70–99)
Glucose-Capillary: 195 mg/dL — ABNORMAL HIGH (ref 70–99)
Glucose-Capillary: 60 mg/dL — ABNORMAL LOW (ref 70–99)
Glucose-Capillary: 125 mg/dL — ABNORMAL HIGH (ref 70–99)
Glucose-Capillary: 60 mg/dL — ABNORMAL LOW (ref 70–99)
Glucose-Capillary: 178 mg/dL — ABNORMAL HIGH (ref 70–99)

## 2018-11-22 NOTE — Progress Notes (Signed)
[place] PHYSICAL MEDICINE & REHABILITATION PROGRESS NOTE   Subjective/Complaints:  No problems except for LBP early this morning. Better with tylenol. Has this occasionally at home  ROS: Patient denies fever, rash, sore throat, blurred vision, nausea, vomiting, diarrhea, cough, shortness of breath or chest pain, , headache, or mood change.    Objective:   No results found. No results for input(s): WBC, HGB, HCT, PLT in the last 72 hours. No results for input(s): NA, K, CL, CO2, GLUCOSE, BUN, CREATININE, CALCIUM in the last 72 hours.  Intake/Output Summary (Last 24 hours) at 11/22/2018 1045 Last data filed at 11/22/2018 0800 Gross per 24 hour  Intake 720 ml  Output -  Net 720 ml     Physical Exam: Vital Signs Blood pressure (!) 119/55, pulse 66, temperature 98.1 F (36.7 C), temperature source Oral, resp. rate 18, height 6' (1.829 m), weight 91.9 kg, SpO2 99 %. Constitutional: No distress . Vital signs reviewed. HEENT: EOMI, oral membranes moist Neck: supple Cardiovascular: RRR without murmur. No JVD    Respiratory: CTA Bilaterally without wheezes or rales. Normal effort    GI: BS +, non-tender, non-distended   Musc: mild LBP Neurologic: Alert Motor: RUE/RLE: 5/5 proximal distal LUE/LLE: 3+/5 proximal to distal Skin: Warm and dry.  Intact.  Assessment/Plan: 1. Functional deficits secondary to recurrent R pon which require 3+ hours per day of interdisciplinary therapy in a comprehensive inpatient rehab setting.  Physiatrist is providing close team supervision and 24 hour management of active medical problems listed below.  Physiatrist and rehab team continue to assess barriers to discharge/monitor patient progress toward functional and medical goals  Care Tool:  Bathing  Bathing activity did not occur: Refused Body parts bathed by patient: Left arm, Chest, Abdomen, Front perineal area, Right upper leg, Left upper leg, Face, Right lower leg, Left lower leg   Body  parts bathed by helper: Right arm, Buttocks     Bathing assist Assist Level: Moderate Assistance - Patient 50 - 74%     Upper Body Dressing/Undressing Upper body dressing   What is the patient wearing?: Pull over shirt    Upper body assist Assist Level: Supervision/Verbal cueing    Lower Body Dressing/Undressing Lower body dressing      What is the patient wearing?: Pants     Lower body assist Assist for lower body dressing: Minimal Assistance - Patient > 75%     Toileting Toileting    Toileting assist Assist for toileting: Maximal Assistance - Patient 25 - 49%     Transfers Chair/bed transfer  Transfers assist  Chair/bed transfer activity did not occur: Safety/medical concerns  Chair/bed transfer assist level: Moderate Assistance - Patient 50 - 74%     Locomotion Ambulation   Ambulation assist      Assist level: Minimal Assistance - Patient > 75% Assistive device: Orthosis(hand rail on R) Max distance: 25   Walk 10 feet activity   Assist     Assist level: Minimal Assistance - Patient > 75% Assistive device: Walker-rolling   Walk 50 feet activity   Assist Walk 50 feet with 2 turns activity did not occur: Safety/medical concerns         Walk 150 feet activity   Assist Walk 150 feet activity did not occur: Safety/medical concerns         Walk 10 feet on uneven surface  activity   Assist Walk 10 feet on uneven surfaces activity did not occur: Safety/medical concerns  Wheelchair     Assist Will patient use wheelchair at discharge?: Yes Type of Wheelchair: Manual    Wheelchair assist level: Supervision/Verbal cueing Max wheelchair distance: 150'    Wheelchair 50 feet with 2 turns activity    Assist        Assist Level: Supervision/Verbal cueing   Wheelchair 150 feet activity     Assist Wheelchair 150 feet activity did not occur: Safety/medical concerns(Per notes)   Assist Level: Supervision/Verbal  cueing    Medical Problem List and Plan: 1.Functional deficits and left hemiparesissecondary to right pontine infarct on 11/05/2018  Continue CIR,on target for D/C 5/18  2. Antithrombotics: -DVT/anticoagulation:Pharmaceutical:Other (comment)--Eliquis. -antiplatelet therapy: ASA 81 mg 3. Pain Management:tylenol prn  -OOB, ROM 4. Mood:LCSW to follow for evaluation and support. -antipsychotic agents: N/A 5. Neuropsych: This patientiscapable of making decisions on hisown behalf. 6. Skin/Wound Care:routine pressure relief measures. 7. Fluids/Electrolytes/Nutrition:Monitor I/O.  8. Dyslipidemia: On Repatha at home. 9.HTN: Monitor BID. On low dose Metoprolol bid.   Norvasc increased to 5 mg on 5/2 Vitals:   11/22/18 0551 11/22/18 0847  BP: (!) 143/61 (!) 119/55  Pulse: (!) 54 66  Resp:    Temp:    SpO2:  99%   Avoid hypotension---observe today  10. Neurogenic bladder/Urinary retention:   Flomax started on 5/3  Resume I/O caths.  11. Reactive depression: Now on Prozac.  12. T2DM: Monitor BS ac/hs. due to hypoglycemia, Home regimen of 70/30 insulin changed to:  35 units with breakfast   5 units with lunch   12 units with supper    CBG (last 3)  Recent Labs    11/21/18 1651 11/21/18 2102 11/22/18 0643  GLUCAP 125* 117* 117*   Reasonable control 5/10 13.  AKI resolved   Creatinine 1.13 on 5/4 BUN 19       LOS: 13 days A FACE TO [place] 11/22/2018, 10:45 AM

## 2018-11-23 ENCOUNTER — Inpatient Hospital Stay (HOSPITAL_COMMUNITY): Payer: Medicare Other | Admitting: Occupational Therapy

## 2018-11-23 ENCOUNTER — Inpatient Hospital Stay (HOSPITAL_COMMUNITY): Payer: Medicare Other

## 2018-11-23 ENCOUNTER — Inpatient Hospital Stay (HOSPITAL_COMMUNITY): Payer: Medicare Other | Admitting: Physical Therapy

## 2018-11-23 LAB — GLUCOSE, CAPILLARY
Glucose-Capillary: 140 mg/dL — ABNORMAL HIGH (ref 70–99)
Glucose-Capillary: 95 mg/dL (ref 70–99)
Glucose-Capillary: 143 mg/dL — ABNORMAL HIGH (ref 70–99)
Glucose-Capillary: 101 mg/dL — ABNORMAL HIGH (ref 70–99)
Glucose-Capillary: 58 mg/dL — ABNORMAL LOW (ref 70–99)

## 2018-11-23 LAB — CBC
HCT: 46.2 % (ref 39.0–52.0)
Hemoglobin: 15.8 g/dL (ref 13.0–17.0)
MCH: 29.3 pg (ref 26.0–34.0)
MCHC: 34.2 g/dL (ref 30.0–36.0)
MCV: 85.6 fL (ref 80.0–100.0)
Platelets: 230 10*3/uL (ref 150–400)
RBC: 5.4 MIL/uL (ref 4.22–5.81)
RDW: 12.6 % (ref 11.5–15.5)
WBC: 8 10*3/uL (ref 4.0–10.5)
nRBC: 0 % (ref 0.0–0.2)

## 2018-11-23 LAB — BASIC METABOLIC PANEL
Anion gap: 10 (ref 5–15)
BUN: 19 mg/dL (ref 8–23)
CO2: 22 mmol/L (ref 22–32)
Calcium: 9.3 mg/dL (ref 8.9–10.3)
Chloride: 104 mmol/L (ref 98–111)
Creatinine, Ser: 1.09 mg/dL (ref 0.61–1.24)
GFR calc Af Amer: >60 mL/min (ref >60)
GFR calc non Af Amer: >60 mL/min (ref >60)
Glucose, Bld: 143 mg/dL — ABNORMAL HIGH (ref 70–99)
Potassium: 4.3 mmol/L (ref 3.5–5.1)
Sodium: 136 mmol/L (ref 135–145)

## 2018-11-23 NOTE — Plan of Care (Signed)
  Problem: RH BOWEL ELIMINATION Goal: RH STG MANAGE BOWEL WITH ASSISTANCE Description STG Manage Bowel with Assistance. mod  Outcome: Progressing Flowsheets (Taken 11/23/2018 1221) STG: Pt will manage bowels with assistance: 6-Modified independent   Problem: RH BLADDER ELIMINATION Goal: RH STG MANAGE BLADDER WITH ASSISTANCE Description STG Manage Bladder With Assistance. Mod  11/23/2018 1221 by Evalee Jefferson, RN Outcome: Progressing Flowsheets (Taken 11/23/2018 1221) STG: Pt will manage bladder with assistance: 6-Modified independent 11/23/2018 1221 by Giah Fickett, Vertell Limber, RN Outcome: Progressing   Problem: RH SKIN INTEGRITY Goal: RH STG SKIN FREE OF INFECTION/BREAKDOWN Description With min. Assist.  11/23/2018 1221 by Evalee Jefferson, RN Outcome: Progressing 11/23/2018 1221 by Evalee Jefferson, RN Outcome: Progressing   Problem: RH SAFETY Goal: RH STG ADHERE TO SAFETY PRECAUTIONS W/ASSISTANCE/DEVICE Description STG Adhere to Safety Precautions With Assistance/Device. Min  11/23/2018 1221 by Evalee Jefferson, RN Outcome: Progressing Flowsheets (Taken 11/23/2018 1221) STG:Pt will adhere to safety precautions with assistance/device: 7-Independent 11/23/2018 1221 by Evalee Jefferson, RN Outcome: Progressing   Problem: RH KNOWLEDGE DEFICIT Goal: RH STG INCREASE KNOWLEDGE OF DIABETES Description Patient will be able to verbalize target blood sugar range, use of medications and diet to manage T2DM with cues/handouts  11/23/2018 1221 by Evalee Jefferson, RN Outcome: Progressing 11/23/2018 1221 by Evalee Jefferson, RN Outcome: Progressing Goal: RH STG INCREASE KNOWLEDGE OF HYPERTENSION Description Patient will be able to describe management of hypertension including diet and medication with cues/handouts  11/23/2018 1221 by Evalee Jefferson, RN Outcome: Progressing 11/23/2018 1221 by Evalee Jefferson, RN Outcome:  Progressing Goal: RH STG INCREASE KNOWLEDGE OF STROKE PROPHYLAXIS Description Patient will be able to describe self care following stroke including prophylaxis medications and lifestyle modifications to reduce risk with cues/handouts  11/23/2018 1221 by Evalee Jefferson, RN Outcome: Progressing 11/23/2018 1221 by Evalee Jefferson, RN Outcome: Progressing   Problem: Consults Goal: RH STROKE PATIENT EDUCATION Description See Patient Education module for education specifics  11/23/2018 1221 by Evalee Jefferson, RN Outcome: Progressing 11/23/2018 1221 by Evalee Jefferson, RN Outcome: Progressing

## 2018-11-23 NOTE — Progress Notes (Signed)
Orthopedic Tech Progress Note Patient Details:  Andre Wolfe October 29, 1934 [id number]  Patient ID: Darnelle Spangle, male   DOB: 01-19-1935, 84 y.o.   MRN: [id number]   [place] 11/23/2018, 1:58 PMCalled [employer] for left AFO brace.

## 2018-11-23 NOTE — Progress Notes (Signed)
Hypoglycemic Event  CBG: 58  Treatment: 8 oz juice/soda  Symptoms: None  Follow-up CBG: Time:2210 CBG Result:101  Possible Reasons for Event: Unknown  Comments/MD notified:Yes     Madolyn Frieze LPN

## 2018-11-23 NOTE — Progress Notes (Signed)
Physical Therapy Session Note  Patient Details  Name: Andre Wolfe MRN: [id number] Date of Birth: 08/14/1934  Today's Date: 11/23/2018 PT Individual Time: 1230-1330 PT Individual Time Calculation (min): 60 min   Short Term Goals: Week 2:  PT Short Term Goal 1 (Week 2): pt will perform gait in controlled environment with mod A x 25' PT Short Term Goal 2 (Week 2): pt will consistently perform transfers at [facility] A  Skilled Therapeutic Interventions/Progress Updates:   pt performs squat pivot and stand pivot transfers throughout session with min A to Rt, Mod A to Lt, continues to require manual facilitation for wt shifts. Pt performs gait with RW 50' x 2 with mod A for wt shifts, Lt LE advancement, mod/max A for safety with turning to sit when fatigued.  Seated ball kick with Lt LE with focus on reaction time and speed, improves with repetition.  standing balance tap ups to 4'' step and side stepping with +2 HHA, mod A.  Pt continues with bilat LE weakness limiting progress.  Pt missed 15 minutes of session due to eating lunch.   Therapy Documentation Precautions:  Precautions Precautions: Fall Restrictions Weight Bearing Restrictions: No General: PT Amount of Missed Time (min): 15 Minutes PT Missed Treatment Reason: Other (Comment)(lunch) Pain: Pt c/o headache at end of session, RN made aware   Therapy/Group: Individual Therapy  Baili Stang 11/23/2018, 2:32 PM

## 2018-11-23 NOTE — Progress Notes (Signed)
Physical Therapy Session Note  Patient Details  Name: Andre Wolfe MRN: [id number] Date of Birth: 10-Dec-1934  Today's Date: 11/23/2018 PT Individual Time: 0927-1035 PT Individual Time Calculation (min): 68 min   Short Term Goals: Week 2:  PT Short Term Goal 1 (Week 2): pt will perform gait in controlled environment with mod A x 25' PT Short Term Goal 2 (Week 2): pt will consistently perform transfers at [facility] A  Skilled Therapeutic Interventions/Progress Updates:    Patient in room in w/c and assisted to therapy gym.  Squat pivot to L with mod A increased time, max cues for anterior weight shift.  Patient sit to stand with chair back for R hand support min to mod A and standing weight shifts, cues for R LE activation.  Pt sit to stand in parallel bars min A and standing taps with R foot to ball x 10 then with L x 10 min to mod A for balance with bilateral UE support.  Patient performed gait in parallel bars x 12' mod A for turns min to mod on straight path.  Patient ambulated with floor ladder for increased stride length mod to max A for balance.  Patient performed side steps up to R to 6" step x 6. Then to L up on 4" step x 5.  Standing side stepping x 4 each way.  Gait with RW and R hand splint mod A x 15' x 2.  Seated reaching to floor to roll ball out and back for anterior weight shift and then lifting ball up overhead with A for L hand due to c/o shoulder pain.  Assisted to w/c to L mod A scooting pivot.  Propelled w/c x 100' toward room then assisted rest of the way.  Squat pivot to R to bed per pt request min to mod A.  Sit to supine min A L LE.  Scoot to [facility] pulling on headboard.  Left with bed alarm active and call bell in reach.    Therapy Documentation Precautions:  Precautions Precautions: Fall Restrictions Weight Bearing Restrictions: No :   Pain: Pain Assessment Pain Score: 0-No pain    Therapy/Group: Individual Therapy  Reginia Naas  Magda Kiel, PT 11/23/2018, 12:56 PM

## 2018-11-23 NOTE — Progress Notes (Signed)
[place] PHYSICAL MEDICINE & REHABILITATION PROGRESS NOTE   Subjective/Complaints:  LBP improved , no dizziness when up   ROS: Patient denies  nausea, vomiting, diarrhea, cough, shortness of breath or chest pain, ,   Objective:   No results found. No results for input(s): WBC, HGB, HCT, PLT in the last 72 hours. No results for input(s): NA, K, CL, CO2, GLUCOSE, BUN, CREATININE, CALCIUM in the last 72 hours.  Intake/Output Summary (Last 24 hours) at 11/23/2018 0733 Last data filed at 11/22/2018 1830 Gross per 24 hour  Intake 600 ml  Output -  Net 600 ml     Physical Exam: Vital Signs Blood pressure 107/65, pulse 79, temperature 98.6 F (37 C), temperature source Oral, resp. rate 12, height 6' (1.829 m), weight 91.9 kg, SpO2 98 %. Constitutional: No distress . Vital signs reviewed. HEENT: EOMI, oral membranes moist Neck: supple Cardiovascular: RRR without murmur. No JVD    Respiratory: CTA Bilaterally without wheezes or rales. Normal effort    GI: BS +, non-tender, non-distended   Musc: mild LBP Neurologic: Alert Motor: RUE/RLE: 5/5 proximal distal LUE/LLE: 3+/5 proximal to distal Skin: Warm and dry.  Intact.  Assessment/Plan: 1. Functional deficits secondary to recurrent R pon which require 3+ hours per day of interdisciplinary therapy in a comprehensive inpatient rehab setting.  Physiatrist is providing close team supervision and 24 hour management of active medical problems listed below.  Physiatrist and rehab team continue to assess barriers to discharge/monitor patient progress toward functional and medical goals  Care Tool:  Bathing  Bathing activity did not occur: Refused Body parts bathed by patient: Left arm, Chest, Abdomen, Front perineal area, Right upper leg, Left upper leg, Face, Right lower leg, Left lower leg   Body parts bathed by helper: Right arm, Buttocks     Bathing assist Assist Level: Moderate Assistance - Patient 50 - 74%     Upper Body  Dressing/Undressing Upper body dressing   What is the patient wearing?: Pull over shirt    Upper body assist Assist Level: Supervision/Verbal cueing    Lower Body Dressing/Undressing Lower body dressing      What is the patient wearing?: Pants     Lower body assist Assist for lower body dressing: Minimal Assistance - Patient > 75%     Toileting Toileting    Toileting assist Assist for toileting: Maximal Assistance - Patient 25 - 49%     Transfers Chair/bed transfer  Transfers assist  Chair/bed transfer activity did not occur: Safety/medical concerns  Chair/bed transfer assist level: Moderate Assistance - Patient 50 - 74%     Locomotion Ambulation   Ambulation assist      Assist level: Minimal Assistance - Patient > 75% Assistive device: Orthosis(hand rail on R) Max distance: 25   Walk 10 feet activity   Assist     Assist level: Minimal Assistance - Patient > 75% Assistive device: Walker-rolling   Walk 50 feet activity   Assist Walk 50 feet with 2 turns activity did not occur: Safety/medical concerns         Walk 150 feet activity   Assist Walk 150 feet activity did not occur: Safety/medical concerns         Walk 10 feet on uneven surface  activity   Assist Walk 10 feet on uneven surfaces activity did not occur: Safety/medical concerns         Wheelchair     Assist Will patient use wheelchair at discharge?: Yes Type of Wheelchair:  Manual    Wheelchair assist level: Supervision/Verbal cueing Max wheelchair distance: 150'    Wheelchair 50 feet with 2 turns activity    Assist        Assist Level: Supervision/Verbal cueing   Wheelchair 150 feet activity     Assist Wheelchair 150 feet activity did not occur: Safety/medical concerns(Per notes)   Assist Level: Supervision/Verbal cueing    Medical Problem List and Plan: 1.Functional deficits and left hemiparesissecondary to right pontine infarct on  11/05/2018  Continue CIR,on target for D/C 5/18  2. Antithrombotics: -DVT/anticoagulation:Pharmaceutical:Other (comment)--Eliquis. -antiplatelet therapy: ASA 81 mg 3. Pain Management:tylenol prn  -OOB, ROM 4. Mood:LCSW to follow for evaluation and support. -antipsychotic agents: N/A 5. Neuropsych: This patientiscapable of making decisions on hisown behalf. 6. Skin/Wound Care:routine pressure relief measures. 7. Fluids/Electrolytes/Nutrition:Monitor I/O.  8. Dyslipidemia: On Repatha at home. 9.HTN: Monitor BID. On low dose Metoprolol bid.   Norvasc increased to 5 mg on 5/2 Vitals:   11/23/18 0505 11/23/18 0507  BP: 121/61 107/65  Pulse: 78 79  Resp: 12   Temp: 98.6 F (37 C)   SpO2: 98%    Controlled without dizziness 10. Neurogenic bladder/Urinary retention:   Flomax started on 5/3, may be contributing to low BP  Resume I/O caths.  11. Reactive depression: Now on Prozac.  12. T2DM: Monitor BS ac/hs. due to hypoglycemia, Home regimen of 70/30 insulin changed to:  35 units with breakfast   5 units with lunch   12 units with supper    CBG (last 3)  Recent Labs    11/22/18 1820 11/22/18 2110 11/23/18 0636  GLUCAP 125* 178* 140*   Reasonable control 5/11 13.  AKI resolved   Creatinine 1.13 on 5/4 BUN 19       LOS: 14 days A FACE TO [place] Andie Mungin 11/23/2018, 7:33 AM

## 2018-11-23 NOTE — Progress Notes (Signed)
Social Work Patient ID: Andre Wolfe, male   DOB: 1935/06/12, 84 y.o.   MRN: [id number] Spoke with daughter to schedule family education with she and pt's wife for Wed. Seeing if wife can manage pt at home with the level of care he is. She is also 84 yo and this worker is not sure whether she will be able to do the care he will require. Daughter can help some but does work. Plan to come in Wed to see him in therapies, will see if can manage his care needs.

## 2018-11-23 NOTE — Progress Notes (Signed)
Occupational Therapy Session Note  Patient Details  Name: Andre Wolfe MRN: [id number] Date of Birth: 04/10/1935  Today's Date: 11/23/2018 OT Individual Time: 0071-2197 OT Individual Time Calculation (min): 45 min    Short Term Goals: Week 2:  OT Short Term Goal 1 (Week 2): Pt will complete bathing with min assist at sit > stand level in shower OT Short Term Goal 2 (Week 2): Pt will complete LB dressing, including footwear, with min assist OT Short Term Goal 3 (Week 2): Pt will complete toilet transfers with min assist OT Short Term Goal 4 (Week 2): Pt will complete toileting hygiene with mod assist  Skilled Therapeutic Interventions/Progress Updates:    Upon entering the room, pt supine in bed with no c/o pain and agreeable to OT intervention. Supine >sit with assistance for trunk. Pt donning LB clothing while seated on EOB with focus on sitting balance. Pt with posterior and L lateral bias while attempting to thread pants onto feet with mod A to correct for balance. Pt standing with mod lifting assistance and transferred into wheelchair. At sink, pt grooming with increased time and use of B hands without cuing to do so. OT provided pt with yellow resistive theraputty for L UE strengthening and coordination exercise with focus on full extension of digits to release items as well. Pt needing min cuing for proper technique. Pt remained in wheelchair with chair alarm belt donned and call bell within reach upon exiting the room.   Therapy Documentation Precautions:  Precautions Precautions: Fall Restrictions Weight Bearing Restrictions: No   Therapy/Group: Individual Therapy  Gypsy Decant 11/23/2018, 12:08 PM

## 2018-11-24 ENCOUNTER — Inpatient Hospital Stay (HOSPITAL_COMMUNITY): Payer: Medicare Other | Admitting: Physical Therapy

## 2018-11-24 ENCOUNTER — Inpatient Hospital Stay (HOSPITAL_COMMUNITY): Payer: Medicare Other | Admitting: Occupational Therapy

## 2018-11-24 LAB — GLUCOSE, CAPILLARY
Glucose-Capillary: 112 mg/dL — ABNORMAL HIGH (ref 70–99)
Glucose-Capillary: 129 mg/dL — ABNORMAL HIGH (ref 70–99)
Glucose-Capillary: 98 mg/dL (ref 70–99)

## 2018-11-24 NOTE — Progress Notes (Signed)
Physical Therapy Session Note  Patient Details  Name: Andre Wolfe MRN: [id number] Date of Birth: 1935/05/01  Today's Date: 11/24/2018 PT Individual Time: 1415-1445 PT Individual Time Calculation (min): 30 min   Short Term Goals: Week 3:  PT Short Term Goal 1 (Week 3): = LTG  Skilled Therapeutic Interventions/Progress Updates:   session focused on transfer training and NMR.  Blocked practice of squat pivot transfers to both sides with CGA to Rt, mod A to Lt, fading to min A with use of stand pivot transfer.  nustep x 8 minutes for UE/LE strength and endurance with continued use of strap to prevent Lt hip ER.  Pt left in bed with alarm set, needs at hand.   Therapy Documentation Precautions:  Precautions Precautions: Fall Restrictions Weight Bearing Restrictions: No Vital Signs: Therapy Vitals Temp: 97.6 F (36.4 C) Temp Source: Oral Pulse Rate: (!) 59 Resp: 18 BP: 120/63 Patient Position (if appropriate): Sitting Oxygen Therapy SpO2: 100 % O2 Device: Room Air Pain:  no c/o pain   Therapy/Group: Individual Therapy  Niamh Rada 11/24/2018, 2:50 PM

## 2018-11-24 NOTE — Progress Notes (Signed)
[place] PHYSICAL MEDICINE & REHABILITATION PROGRESS NOTE   Subjective/Complaints:  Hypoglycemic episode yesterda evening.  Reviewed diabetic management  ROS: Patient denies  nausea, vomiting, diarrhea, cough, shortness of breath or chest pain, ,   Objective:   No results found. Recent Labs    11/23/18 0643  WBC 8.0  HGB 15.8  HCT 46.2  PLT 230   Recent Labs    11/23/18 0643  NA 136  K 4.3  CL 104  CO2 22  GLUCOSE 143*  BUN 19  CREATININE 1.09  CALCIUM 9.3    Intake/Output Summary (Last 24 hours) at 11/24/2018 0738 Last data filed at 11/23/2018 1900 Gross per 24 hour  Intake 220 ml  Output -  Net 220 ml     Physical Exam: Vital Signs Blood pressure 120/80, pulse 67, temperature 98.3 F (36.8 C), resp. rate 18, height 6' (1.829 m), weight 90.4 kg, SpO2 99 %. Constitutional: No distress . Vital signs reviewed. HEENT: EOMI, oral membranes moist Neck: supple Cardiovascular: RRR without murmur. No JVD    Respiratory: CTA Bilaterally without wheezes or rales. Normal effort    GI: BS +, non-tender, non-distended   Musc: mild LBP Neurologic: Alert Motor: RUE/RLE: 5/5 proximal distal LUE/LLE: 3+/5 proximal to distal Skin: Warm and dry.  Intact.  Assessment/Plan: 1. Functional deficits secondary to recurrent R pon which require 3+ hours per day of interdisciplinary therapy in a comprehensive inpatient rehab setting.  Physiatrist is providing close team supervision and 24 hour management of active medical problems listed below.  Physiatrist and rehab team continue to assess barriers to discharge/monitor patient progress toward functional and medical goals  Care Tool:  Bathing  Bathing activity did not occur: Refused Body parts bathed by patient: Left arm, Chest, Abdomen, Front perineal area, Right upper leg, Left upper leg, Face, Right lower leg, Left lower leg   Body parts bathed by helper: Right arm, Buttocks     Bathing assist Assist Level: Moderate  Assistance - Patient 50 - 74%     Upper Body Dressing/Undressing Upper body dressing   What is the patient wearing?: Pull over shirt    Upper body assist Assist Level: Supervision/Verbal cueing    Lower Body Dressing/Undressing Lower body dressing      What is the patient wearing?: Pants     Lower body assist Assist for lower body dressing: Minimal Assistance - Patient > 75%     Toileting Toileting    Toileting assist Assist for toileting: Maximal Assistance - Patient 25 - 49%     Transfers Chair/bed transfer  Transfers assist  Chair/bed transfer activity did not occur: Safety/medical concerns  Chair/bed transfer assist level: Moderate Assistance - Patient 50 - 74%     Locomotion Ambulation   Ambulation assist      Assist level: Moderate Assistance - Patient 50 - 74% Assistive device: Walker-rolling(& L hand splint) Max distance: 15'   Walk 10 feet activity   Assist     Assist level: Moderate Assistance - Patient - 50 - 74% Assistive device: Walker-rolling(& L hand splint)   Walk 50 feet activity   Assist Walk 50 feet with 2 turns activity did not occur: Safety/medical concerns         Walk 150 feet activity   Assist Walk 150 feet activity did not occur: Safety/medical concerns         Walk 10 feet on uneven surface  activity   Assist Walk 10 feet on uneven surfaces activity did not  occur: Safety/medical concerns         Wheelchair     Assist Will patient use wheelchair at discharge?: Yes Type of Wheelchair: Manual    Wheelchair assist level: Supervision/Verbal cueing Max wheelchair distance: 91'    Wheelchair 50 feet with 2 turns activity    Assist        Assist Level: Supervision/Verbal cueing   Wheelchair 150 feet activity     Assist Wheelchair 150 feet activity did not occur: Safety/medical concerns(Per notes)   Assist Level: Supervision/Verbal cueing    Medical Problem List and  Plan: 1.Functional deficits and left hemiparesissecondary to right pontine infarct on 11/05/2018  Continue CIR,on target for D/C 5/18  2. Antithrombotics: -DVT/anticoagulation:Pharmaceutical:Other (comment)--Eliquis. -antiplatelet therapy: ASA 81 mg 3. Pain Management:tylenol prn  -OOB, ROM 4. Mood:LCSW to follow for evaluation and support. -antipsychotic agents: N/A 5. Neuropsych: This patientiscapable of making decisions on hisown behalf. 6. Skin/Wound Care:routine pressure relief measures. 7. Fluids/Electrolytes/Nutrition:Monitor I/O.  8. Dyslipidemia: On Repatha at home. 9.HTN: Monitor BID. On low dose Metoprolol bid.   Norvasc increased to 5 mg on 5/2 Vitals:   11/23/18 1941 11/24/18 0432  BP: 117/70 120/80  Pulse: 85 67  Resp: 20 18  Temp: (!) 97.4 F (36.3 C) 98.3 F (36.8 C)  SpO2: 99% 99%   Controlled without dizziness 10. Neurogenic bladder/Urinary retention:   Flomax started on 5/3, may be contributing to low BP  Resume I/O caths.  11. Reactive depression: Now on Prozac.  12. T2DM: Monitor BS ac/hs. due to hypoglycemia, Home regimen of 70/30 insulin changed to:  35 units with breakfast   5 units with lunch - Will d/c this dose as pm hypoglycemia likely due to long acting effect from noon and short acting from 4pm combine  12 units with supper    CBG (last 3)  Recent Labs    11/23/18 2136 11/23/18 2210 11/24/18 0630  GLUCAP 58* 101* 98   13.  AKI resolved   Creatinine 1.13 on 5/4 BUN 19, stable values on 5/11       LOS: 15 days A FACE TO [place] E Kirsteins 11/24/2018, 7:38 AM

## 2018-11-24 NOTE — Progress Notes (Signed)
Physical Therapy Weekly Progress Note  Patient Details  Name: Andre Wolfe MRN: [id number] Date of Birth: 30-Oct-1934  Beginning of progress report period: Nov 17, 2018 End of progress report period: Nov 24, 2018   Patient has met 2 of 2 short term goals.  Pt making slow progress towards goals. Goals downgraded to min A overall due to impaired balance, coordination and motor planning.  Family education scheduled this week  Patient continues to demonstrate the following deficits muscle weakness, impaired timing and sequencing, abnormal tone, decreased coordination and decreased motor planning and decreased standing balance, decreased postural control, hemiplegia and decreased balance strategies and therefore will continue to benefit from skilled PT intervention to increase functional independence with mobility.  Patient not progressing toward long term goals.  See goal revision..  Continue plan of care.  PT Short Term Goals Week 2:  PT Short Term Goal 1 (Week 2): pt will perform gait in controlled environment with mod A x 25' PT Short Term Goal 1 - Progress (Week 2): Met PT Short Term Goal 2 (Week 2): pt will consistently perform transfers at mod A PT Short Term Goal 2 - Progress (Week 2): Met Week 3:  PT Short Term Goal 1 (Week 3): = LTG  Skilled Therapeutic Interventions/Progress Updates:  Ambulation/gait training;Community reintegration;DME/adaptive equipment instruction;Neuromuscular re-education;Stair training;UE/LE Strength taining/ROM;Wheelchair propulsion/positioning;Balance/vestibular training;Discharge planning;Functional electrical stimulation;Pain management;Therapeutic Activities;UE/LE Coordination activities;Therapeutic Exercise;Splinting/orthotics;Patient/family education;Functional mobility training;Cognitive remediation/compensation     [name] 11/24/2018, 8:24 AM

## 2018-11-24 NOTE — Progress Notes (Signed)
Physical Therapy Session Note  Patient Details  Name: SUTTER AHLGREN MRN: [id number] Date of Birth: [birth year]  Today's Date: 11/24/2018 PT Individual Time: 0830-0927 PT Individual Time Calculation (min): 57 min   Short Term Goals: Week 3:  PT Short Term Goal 1 (Week 3): = LTG  Skilled Therapeutic Interventions/Progress Updates:  Pt performs squat pivot transfers with min A throughout session.  W/c mobility with hemi technique and supervision.  Sit <> stand repetitions with focus on eccentric control with min cuing and facilitation.  Pre gait stepping with Rt LE with focus on Lt LE stance control with mod manual facilitation for wt shifts and tactile cues for quad contraction on Lt.  Floor transfer with +2 max A due to impaired motor planning, Lt LE tone and Lt side weakness.  Gait training with ace wrap on Lt LE with mod A for balance, RW control 40' x 2.  Pt left in w/c with alarm set, needs at hand  Therapy Documentation Precautions:  Precautions Precautions: Fall Restrictions Weight Bearing Restrictions: No Pain: No c/o pain   Therapy/Group: Individual Therapy  Anh Bigos 11/24/2018, 9:27 AM

## 2018-11-24 NOTE — Plan of Care (Signed)
  Problem: RH BOWEL ELIMINATION Goal: RH STG MANAGE BOWEL WITH ASSISTANCE Description STG Manage Bowel with Assistance. mod  Outcome: Progressing

## 2018-11-24 NOTE — Progress Notes (Signed)
Occupational Therapy Weekly Progress Note  Patient Details  Name: Andre Wolfe MRN: [id number] Date of Birth: 20-Jun-1935  Beginning of progress report period: Nov 17, 2018 End of progress report period: Nov 24, 2018  Today's Date: 11/24/2018 OT Individual Time: 1040-1133 and 1300-1400 OT Individual Time Calculation (min): 53 min and 60 min   Patient has met 2 of 4 short term goals.  Pt is making steady progress towards goals with improvements in stand pivot transfers to min-mod assist, however due to impaired motor planning pt lacks consistency with transfers.  Pt has demonstrated improved standing balance and tolerance during LB bathing/hygiene/dressing.  Continues to demonstrate improved functional use of LUE during self-care tasks and self-feeding.  Pt continues to require mod cues for sequencing during mobility and self-care tasks due to impaired motor planning.  Pt will benefit from family education prior to d/c home to ensure safety and feasibility of family to provide care at current level.  Patient continues to demonstrate the following deficits: muscle weakness,decreased cardiorespiratoy endurance,impaired timing and sequencing, unbalanced muscle activation and decreased coordination,decreased problem solving, decreased safety awareness and decreased memoryand decreased standing balance, hemiplegia and decreased balance strategies and therefore will continue to benefit from skilled OT intervention to enhance overall performance with BADL and Reduce care partner burden.  Patient not progressing toward long term goals.  See goal revision..  Plan of care revisions: downgraded all dynamic standing tasks to min assist.  OT Short Term Goals Week 2:  OT Short Term Goal 1 (Week 2): Pt will complete bathing with min assist at sit > stand level in shower OT Short Term Goal 1 - Progress (Week 2): Met OT Short Term Goal 2 (Week 2): Pt will complete LB dressing, including footwear, with min  assist OT Short Term Goal 2 - Progress (Week 2): Progressing toward goal OT Short Term Goal 3 (Week 2): Pt will complete toilet transfers with min assist OT Short Term Goal 3 - Progress (Week 2): Progressing toward goal OT Short Term Goal 4 (Week 2): Pt will complete toileting hygiene with mod assist OT Short Term Goal 4 - Progress (Week 2): Met Week 3:  OT Short Term Goal 1 (Week 3): Pt will complete LB dressing, including footwear, with min assist OT Short Term Goal 2 (Week 3): Pt will complete toilet transfers with min assist  Skilled Therapeutic Interventions/Progress Updates:    1) Treatment session with focus on LUE NMR, sit > stand, and standing balance.  Pt received upright in w/c reporting already dressed prior to session.  Pt propelled w/c 100' with use of RLE and BUE with supervision.  Engaged in sit > stand with pt requiring min-mod assist as pt continues to demonstrate posterior lean during transitional movements.  Engaged in gross and fine motor control and reaching in standing with focus on weight shifting through LLE during reaching with LUE.  Therapist providing tactile cues at LLE and Lt trunk during reaching and standing.  Stand pivot transfers completed during session with min assist sit > stand and min-mod assist during transfers.  Returned to room and left upright in w/c with seat belt alarm on and all needs in reach.  2) Treatment session with focus on functional transfers and short distance ambulation.  Pt received upright in w/c finishing lunch.  Engaged in discussion regarding current level, goals, and family education scheduled with wife and daughter to further assess needs upon d/c and ability for family to provide necessary physical assist. Discussed need for  BSC next to toilet if home bathroom is not w/c accessible and if family not able to provide physical assist to ambulate in/out of bathroom.  Pt doffed/donned socks and shoes with increased time, ultimately requiring  assist to fasten shoes - may benefit from shoe buttons.  Pt ambulated 20' with RW with min-mod assist and 2 turns.  Orthotist present to observe pt ambulation to assess need for AFO.  Completed short distance ambulation in to ADL tubroom to complete toilet transfers.  Pt reports need to toilet.  Completed transfers and toileting tasks with min assist this session. With pt demonstrating improved standing posture and tolerance while also completing clothing management prior to toileting and hygiene.  Returned to room and left upright in w/c with seat belt alarm on and all needs in reach.  Therapy Documentation Precautions:  Precautions Precautions: Fall Restrictions Weight Bearing Restrictions: No General:   Vital Signs: Therapy Vitals Temp: 97.6 F (36.4 C) Temp Source: Oral Pulse Rate: (!) 59 Resp: 18 BP: 120/63 Patient Position (if appropriate): Sitting Oxygen Therapy SpO2: 100 % O2 Device: Room Air Pain:  Pt with no c/o pain   Therapy/Group: Individual Therapy  [name] 11/24/2018, 3:23 PM

## 2018-11-25 ENCOUNTER — Ambulatory Visit (HOSPITAL_COMMUNITY): Payer: Medicare Other | Admitting: Physical Therapy

## 2018-11-25 ENCOUNTER — Inpatient Hospital Stay (HOSPITAL_COMMUNITY): Payer: Medicare Other

## 2018-11-25 ENCOUNTER — Encounter (HOSPITAL_COMMUNITY): Payer: Medicare Other | Admitting: Occupational Therapy

## 2018-11-25 LAB — GLUCOSE, CAPILLARY
Glucose-Capillary: 87 mg/dL (ref 70–99)
Glucose-Capillary: 156 mg/dL — ABNORMAL HIGH (ref 70–99)
Glucose-Capillary: 79 mg/dL (ref 70–99)
Glucose-Capillary: 144 mg/dL — ABNORMAL HIGH (ref 70–99)

## 2018-11-25 MED ORDER — INSULIN ASPART PROT & ASPART (70-30 MIX) 100 UNIT/ML ~~LOC~~ SUSP
10.0000 [IU] | Freq: Every day | SUBCUTANEOUS | Status: DC
  Administered 2018-11-25 – 2018-12-02 (×8): 10 [IU] via SUBCUTANEOUS

## 2018-11-25 NOTE — Progress Notes (Signed)
Occupational Therapy Session Note  Patient Details  Name: Andre Wolfe MRN: [id number] Date of Birth: Mar 03, 1935  Today's Date: 11/25/2018 OT Individual Time: 1000-1100 OT Individual Time Calculation (min): 60 min    Short Term Goals: Week 3:  OT Short Term Goal 1 (Week 3): Pt will complete LB dressing, including footwear, with min assist OT Short Term Goal 2 (Week 3): Pt will complete toilet transfers with min assist  Skilled Therapeutic Interventions/Progress Updates:    Treatment session with focus on hands on family education with wife to assess safety and feasibility of pt d/c home with 40 yo wife.  Pt's wife and daughter present throughout session.  Discussed current goals and current level during self-care and functional transfers.  Discussed concerns with bathroom not being w/c accessible and shower transfers being unsafe.  Engaged in sit > stand with mod -min assist throughout.  Therapist providing max faded to mod cues for setup and sequencing of sit > stand to improve success.  Pt demonstrating decreased recall despite repetition.  Pt's wife engaged in sit > stand with pt, pt requiring mod assist and wife visible exhausted after even one sit > stand from w/c.  Discussed use of BSC for toileting and completed blocked practice stand pivot transfers with RW with min-mod assist.  Pt's wife able to assist with transfer from elevated EOB to [facility] with RW, however therapist still providing mod cues for sequencing and foot placement.  Pt's daughter shaking head and stating that wife cannot provide this level of care 24/7.  Discussed options regarding hired care at home vs SNF, recommended further discussion amongst themselves and discussion with SWK.  Pt remained upright in w/c with seat belt alarm on and family in room, awaiting PT.  Therapy Documentation Precautions:  Precautions Precautions: Fall Restrictions Weight Bearing Restrictions: No General:   Vital Signs:  Pain: Pain  Assessment Pain Scale: 0-10 Pain Score: 0-No pain   Therapy/Group: Individual Therapy  [name] 11/25/2018, 12:28 PM

## 2018-11-25 NOTE — Progress Notes (Signed)
Occupational Therapy Session Note  Patient Details  Name: Andre Wolfe MRN: [id number] Date of Birth: September 29, 1934  Today's Date: 11/25/2018 OT Individual Time: 8657-8469 OT Individual Time Calculation (min): 75 min    Short Term Goals: Week 3:  OT Short Term Goal 1 (Week 3): Pt will complete LB dressing, including footwear, with min assist OT Short Term Goal 2 (Week 3): Pt will complete toilet transfers with min assist  Skilled Therapeutic Interventions/Progress Updates:    OT intervention with focus on bed mobility, functional transfers, sit<>stand, standing balance, LUE functional use, BADL retraining, sitting balance, activity tolerance, and safety awareness to increase independence with BADLs. Pt completed bathing/dressing tasks with sit<>stand from w/c at sink. Pt initiates use of LUE during bathing/dressing tasks but requires more than a reasonable amount of time to complete tasks. Pt requires min A for sit<>stand at sink. Pt required assistance threading RLE into pants. Pt transitioned to gym and engaged in LUE tasks of grasping/retrieving large and small pegs and placing in peg board. Pt completed tasks with more than a reasonable amount of time.  Pt using LUE at diminished level for functional tasks.  Pt required min A for squat pivot tranfsers with max verbal cues for safety and sequencing. Pt remained seated in w/c with belt alarm activated and all needs within reach.   Therapy Documentation Precautions:  Precautions Precautions: Fall Restrictions Weight Bearing Restrictions: No Pain:  Pt denies pain   Therapy/Group: Individual Therapy  [name] 11/25/2018, 8:19 AM

## 2018-11-25 NOTE — Progress Notes (Signed)
Social Work Patient ID: Andre Wolfe, male   DOB: 1935/06/10, 84 y.o.   MRN: [id number] Met with pt and daughter and wife to see how education went today. Ptis too much care of his wife and she will not be able to porvdie the care he will ned at discharge. Will need to pursue NHP now. He want sot go back to his hometown in [place]. Will contact [facility] and see if taking pt and if can take pt. Aware team felt he would still require much care if stayed her longer. This was addressed in team conference held today.

## 2018-11-25 NOTE — Progress Notes (Signed)
Physical Therapy Session Note  Patient Details  Name: Andre Wolfe MRN: [id number] Date of Birth: [birth year]  Today's Date: 11/25/2018 PT Individual Time: 1100-1145 PT Individual Time Calculation (min): 45 min   Short Term Goals: Week 3:  PT Short Term Goal 1 (Week 3): = LTG  Skilled Therapeutic Interventions/Progress Updates:   session focused on pt/family education with pt/daughter/wife.  PT demo'd gait with pt with RW and min/mod A 25' x 2.  Wife performs gait with pt with PT assisting to provide assistance and safety.  PT educated family on PT recommendation for pt to be w/c level at home and only perform gait with HHPT.  PT demonstrated and pt/daughter performed simulated car transfer with squat pivot transfer and with RW for stand pivot transfer with pt able to perform at mod A level.  Pt/family aware of PT recommendation for 24/7 assistance.  Pt/family ask to speak to social worker at end of education. Pt left with alarm set, family present.   Therapy Documentation Precautions:  Precautions Precautions: Fall Restrictions Weight Bearing Restrictions: No Pain: Pain Assessment Pain Scale: 0-10 Pain Score: 0-No pain    Therapy/Group: Individual Therapy  Alferd Obryant 11/25/2018, 1:51 PM

## 2018-11-25 NOTE — Patient Care Conference (Signed)
Inpatient RehabilitationTeam Conference and Plan of Care Update Date: 11/25/2018   Time: 11:30 AM    Patient Name: Andre Wolfe      Medical Record Number: [id number]  Date of Birth: [birth year] Sex: Male         Room/Bed: 4M09C/4M09C-01 Payor Info: Payor: MEDICARE / Plan: MEDICARE PART A AND B / Product Type: *No Product type* /    Admitting Diagnosis: B CVA; 0106D; 21-24days  Admit Date/Time:  11/09/2018  4:24 PM Admission Comments: No comment available   Primary Diagnosis:  <principal problem not specified> Principal Problem: <principal problem not specified>  Patient Active Problem List   Diagnosis Date Noted  . History of depression   . AKI (acute kidney injury) ([place])   . Diabetes mellitus type 2 in nonobese (H[facility])   . Hypoglycemia   . Labile blood glucose   . Neurogenic bladder   . Urinary retention   . Bradycardia   . Right pontine stroke ([place]) 07/16/2018  . Acute ischemic stroke ([place])   . Essential hypertension   . Poorly controlled type 2 diabetes mellitus with peripheral neuropathy ([place])   . Tobacco abuse   . PAF (paroxysmal atrial fibrillation) ([place])   . Right pontine CVA ([place]) 07/13/2018  . Hypercholesterolemia 05/04/2018    Expected Discharge Date: Expected Discharge Date: 11/30/18  Team Members Present: Physician leading conference: Dr. Alysia Penna Social Worker Present: Ovidio Kin, LCSW Nurse Present: Rayetta Humphrey, RN PT Present: Barrie Folk, PT;Rosita Dechalus, PTA OT Present: Darleen Crocker, OT SLP Present: Stormy Fabian, SLP PPS Coordinator present : Gunnar Fusi     Current Status/Progress Goal Weekly Team Focus  Medical   CBGs labile, some fluctuation in balance  reduce fall risk, manage diabetes  d/c planning   Bowel/Bladder   Incontinent of B/B PVR Q4-6 hr LBM 05/12  continent of B/B regular bowel pattern  timed toileting laxatives prn PVR as needed   Swallow/Nutrition/ Hydration             ADL's   setup UB dressing, mod assist  bathing and LB dressing, min-mod assist stand pivot transfers  Min assist bathing, LB dressing, transfers, any task with dynamic standing balance, Supervison UB dressing, grooming, feeding  ADL retraining, transfers, dynamic standing balance, LUE NMR, family education, d/c planning   Mobility   min A transfers, mod A gait  downgraded to min A transfers and gait  family ed, d/c planning   Communication             Safety/Cognition/ Behavioral Observations            Pain   no pain  remain pain free  assess pain qshift and prn   Skin   skin intact   skin remains intact   assess skin qshift and prn      *See Care Plan and progress notes for long and short-term goals.     Barriers to Discharge  Current Status/Progress Possible Resolutions Date Resolved   Physician    Medical stability     slow progress due to hx of multiple infarcts  cont rehab, d/c planning , family training      Nursing                  PT                    OT  SLP                SW                Discharge Planning/Teaching Needs:  Wife and daughter to be here today to go through family education to see if they can manage pt at DC with his care needs.      Team Discussion:  Progressing in therapies but still flucuates between min-mod level and pushes at times. MD ajdusting insulin. PT has down graded goals to min assist level. Family here to see if can pprovide the care he will need at DC. Speech has DC met goals. Family feels too much care for them and will need to pursue NHP  Revisions to Treatment Plan:  Plan changed to NHP    Continued Need for Acute Rehabilitation Level of Care: The patient requires daily medical management by a physician with specialized training in physical medicine and rehabilitation for the following conditions: Daily direction of a multidisciplinary physical rehabilitation program to ensure safe treatment while eliciting the highest outcome that is of practical  value to the patient.: Yes Daily medical management of patient stability for increased activity during participation in an intensive rehabilitation regime.: Yes Daily analysis of laboratory values and/or radiology reports with any subsequent need for medication adjustment of medical intervention for : Neurological problems;Diabetes problems   I attest that I was present, lead the team conference, and concur with the assessment and plan of the team. Teleconference held due to [place] [facility]   Lovetta Condie, Gardiner Rhyme 11/25/2018, 12:53 PM

## 2018-11-25 NOTE — Progress Notes (Signed)
[facility] PHYSICAL MEDICINE & REHABILITATION PROGRESS NOTE   Subjective/Complaints:  Working with OT on ADL  ROS: Patient denies  nausea, vomiting, diarrhea, cough, shortness of breath or chest pain, ,   Objective:   No results found. Recent Labs    11/23/18 0643  WBC 8.0  HGB 15.8  HCT 46.2  PLT 230   Recent Labs    11/23/18 0643  NA 136  K 4.3  CL 104  CO2 22  GLUCOSE 143*  BUN 19  CREATININE 1.09  CALCIUM 9.3    Intake/Output Summary (Last 24 hours) at 11/25/2018 0756 Last data filed at 11/24/2018 1810 Gross per 24 hour  Intake 462 ml  Output -  Net 462 ml     Physical Exam: Vital Signs Blood pressure (!) 149/69, pulse (!) 58, temperature 98 F (36.7 C), resp. rate 19, height 6' (1.829 m), weight 92.3 kg, SpO2 100 %. Constitutional: No distress . Vital signs reviewed. HEENT: EOMI, oral membranes moist Neck: supple Cardiovascular: RRR without murmur. No JVD    Respiratory: CTA Bilaterally without wheezes or rales. Normal effort    GI: BS +, non-tender, non-distended   Musc: mild LBP Neurologic: Alert Motor: RUE/RLE: 5/5 proximal distal LUE/LLE: 3+/5 proximal to distal Skin: Warm and dry.  Intact.  Assessment/Plan: 1. Functional deficits secondary to recurrent R pontine infarct which require 3+ hours per day of interdisciplinary therapy in a comprehensive inpatient rehab setting.  Physiatrist is providing close team supervision and 24 hour management of active medical problems listed below.  Physiatrist and rehab team continue to assess barriers to discharge/monitor patient progress toward functional and medical goals  Care Tool:  Bathing  Bathing activity did not occur: Refused Body parts bathed by patient: Left arm, Chest, Abdomen, Front perineal area, Right upper leg, Left upper leg, Face, Right lower leg, Left lower leg   Body parts bathed by helper: Right arm, Buttocks     Bathing assist Assist Level: Moderate Assistance - Patient 50 -  74%     Upper Body Dressing/Undressing Upper body dressing   What is the patient wearing?: Pull over shirt    Upper body assist Assist Level: Supervision/Verbal cueing    Lower Body Dressing/Undressing Lower body dressing      What is the patient wearing?: Pants     Lower body assist Assist for lower body dressing: Minimal Assistance - Patient > 75%     Toileting Toileting    Toileting assist Assist for toileting: Maximal Assistance - Patient 25 - 49%     Transfers Chair/bed transfer  Transfers assist  Chair/bed transfer activity did not occur: Safety/medical concerns  Chair/bed transfer assist level: Moderate Assistance - Patient 50 - 74%     Locomotion Ambulation   Ambulation assist      Assist level: Moderate Assistance - Patient 50 - 74% Assistive device: Walker-rolling(& L hand splint) Max distance: 15'   Walk 10 feet activity   Assist     Assist level: Moderate Assistance - Patient - 50 - 74% Assistive device: Walker-rolling(& L hand splint)   Walk 50 feet activity   Assist Walk 50 feet with 2 turns activity did not occur: Safety/medical concerns         Walk 150 feet activity   Assist Walk 150 feet activity did not occur: Safety/medical concerns         Walk 10 feet on uneven surface  activity   Assist Walk 10 feet on uneven surfaces activity did not  occur: Safety/medical concerns         Wheelchair     Assist Will patient use wheelchair at discharge?: Yes Type of Wheelchair: Manual    Wheelchair assist level: Supervision/Verbal cueing Max wheelchair distance: 57'    Wheelchair 50 feet with 2 turns activity    Assist        Assist Level: Supervision/Verbal cueing   Wheelchair 150 feet activity     Assist Wheelchair 150 feet activity did not occur: Safety/medical concerns(Per notes)   Assist Level: Supervision/Verbal cueing    Medical Problem List and Plan: 1.Functional deficits and left  hemiparesissecondary to right pontine infarct on 11/05/2018  Continue CIR,Team conference today please see physician documentation under team conference tab, met with team face-to-face to discuss problems,progress, and goals. Formulized individual treatment plan based on medical history, underlying problem and comorbidities. 2. Antithrombotics: -DVT/anticoagulation:Pharmaceutical:Other (comment)--Eliquis. -antiplatelet therapy: ASA 81 mg 3. Pain Management:tylenol prn  -OOB, ROM 4. Mood:LCSW to follow for evaluation and support. -antipsychotic agents: N/A 5. Neuropsych: This patientiscapable of making decisions on hisown behalf. 6. Skin/Wound Care:routine pressure relief measures. 7. Fluids/Electrolytes/Nutrition:Monitor I/O.  8. Dyslipidemia: On Repatha at home. 9.HTN: Monitor BID. On low dose Metoprolol bid.   Norvasc increased to 5 mg on 5/2 Vitals:   11/24/18 2012 11/25/18 0629  BP: 138/67 (!) 149/69  Pulse: 60 (!) 58  Resp: 19 19  Temp: 97.8 F (36.6 C) 98 F (36.7 C)  SpO2: 99% 100%   Controlled without dizziness 10. Neurogenic bladder/Urinary retention:   Flomax started on 5/3,vodining well    11. Reactive depression: Now on Prozac.  12. T2DM: Monitor BS ac/hs. due to hypoglycemia, Home regimen of 70/30 insulin changed to:  35 units with breakfast   5 units with lunch - Will d/c this dose as pm hypoglycemia likely due to long acting effect from noon and short acting from 4pm combine  12 units with supper    CBG (last 3)  Recent Labs    11/24/18 1313 11/24/18 2355 11/25/18 0628  GLUCAP 129* 112* 87  am CBG still on low side reduce pm 70/30 to 10U       LOS: 16 days A FACE TO [place] E Kirsteins 11/25/2018, 7:56 AM

## 2018-11-26 ENCOUNTER — Inpatient Hospital Stay (HOSPITAL_COMMUNITY): Payer: Medicare Other | Admitting: Physical Therapy

## 2018-11-26 ENCOUNTER — Inpatient Hospital Stay (HOSPITAL_COMMUNITY): Payer: Medicare Other

## 2018-11-26 ENCOUNTER — Inpatient Hospital Stay (HOSPITAL_COMMUNITY): Payer: Medicare Other | Admitting: Occupational Therapy

## 2018-11-26 LAB — GLUCOSE, CAPILLARY
Glucose-Capillary: 99 mg/dL (ref 70–99)
Glucose-Capillary: 119 mg/dL — ABNORMAL HIGH (ref 70–99)
Glucose-Capillary: 170 mg/dL — ABNORMAL HIGH (ref 70–99)
Glucose-Capillary: 134 mg/dL — ABNORMAL HIGH (ref 70–99)

## 2018-11-26 LAB — SARS CORONAVIRUS 2 BY RT PCR (HOSPITAL ORDER, PERFORMED IN ~~LOC~~ HOSPITAL LAB): SARS Coronavirus 2: NEGATIVE

## 2018-11-26 NOTE — Progress Notes (Signed)
Physical Therapy Session Note  Patient Details  Name: Andre Wolfe MRN: [id number] Date of Birth: 05/17/1935  Today's Date: 11/26/2018 PT Individual Time: 1032-1102 PT Individual Time Calculation (min): 30 min   Short Term Goals:  Week 3:  PT Short Term Goal 1 (Week 3): = LTG     Skilled Therapeutic Interventions/Progress Updates:   Pt sitting up in w/c.  He denies pain.  Pt assisted in setting up w/c with 1 cue for bil footrests.  Squat pivot w/c > mat to R with CGA.    neuromuscular re-education via forced use, multimodal cues and weights for sustained stretch L hamstrings and heel cords in sitting, with 7# weight on distal thigh, propped on 10" footstool.  PT donned TEDs and bil shoes, L AFO.  Sit> stand with RW, L hand splint, min assist.  Gait training including 2 turns with min/mod assist on straight path, and mod assist on turns due to over-shift L and narrow BOS.  At end of session, pt left resting in w/c with needs at hand and seat belt alarm set.     Therapy Documentation Precautions:  Precautions Precautions: Fall Restrictions Weight Bearing Restrictions: No  Pain: Pain Assessment Pain Scale: 0-10 Pain Score: 0-No pain    Therapy/Group: Individual Therapy  Avianah Pellman 11/26/2018, 12:23 PM

## 2018-11-26 NOTE — Progress Notes (Signed)
Occupational Therapy Session Note  Patient Details  Name: Andre Wolfe MRN: [id number] Date of Birth: [birth year]  Today's Date: 11/26/2018 OT Individual Time: 0800-0900 OT Individual Time Calculation (min): 60 min    Short Term Goals: Week 3:  OT Short Term Goal 1 (Week 3): Pt will complete LB dressing, including footwear, with min assist OT Short Term Goal 2 (Week 3): Pt will complete toilet transfers with min assist  Skilled Therapeutic Interventions/Progress Updates:    Patient in bed and ready for therapy.  Bed mobility supine to SSP with mod A.  Sit pivot transfers to/from bed, w/c, mat with min/mod a and cues for technique.  Grooming tasks mod I seated in w/c.  Patient able to propel w/c 100 feet with bilateral UEs at slow rate and cues for left.  Completed standing weight shift and stepping activities to increase use of left LE in stance - focus on posture and symmetry.  Completed UE stretching, posture, seated balance and AROM/dexterity activities with bilateral UEs - focus on scapular position and ER with increased reach noted.  Patient remained in w/c at close of session with seat belt alarm on and call bell in reach.    Therapy Documentation Precautions:  Precautions Precautions: Fall Restrictions Weight Bearing Restrictions: No General:   Vital Signs:  Pain: Pain Assessment Pain Scale: 0-10 Pain Score: 0-No pain    Other Treatments:     Therapy/Group: Individual Therapy  Carlos Levering 11/26/2018, 12:14 PM

## 2018-11-26 NOTE — Progress Notes (Signed)
Social Work Patient ID: Andre Wolfe, male   DOB: [birth year], 84 y.o.   MRN: [id number]  Pt wants to go to the NH in his hometown in [place]. Have contacted them and have sent information regarding ability to offer a bed. Will await response from facility.

## 2018-11-26 NOTE — Progress Notes (Signed)
Physical Therapy Session Note  Patient Details  Name: Andre Wolfe MRN: [id number] Date of Birth: 12-Jul-1935  Today's Date: 11/26/2018 PT Individual Time: 6789-3810 PT Individual Time Calculation (min): 64 min   Short Term Goals: Week 3:  PT Short Term Goal 1 (Week 3): = LTG  Skilled Therapeutic Interventions/Progress Updates:    Pt received supine in bed and agreeable to therapy session. Pt wearing B LE TED hose and L LE AFO throughout session. Supine>sit, HOB partially elevated and using bedrails, with close supervision. Sit<>stand from EOB>RW with mod assist for lifting. Stand pivot EOB>w/c using RW with mod assist for balance due to posterior lean. Transported to/from gym in w/c. Stand pivot transfer w/c to [place] using RW with mod assist for balance due to posterior lean. Performed 3 bouts of standing Kinetron to fatigue focusing on lateral weightshifting, L LE NMR and strengthening, as well as standing balance all with mirror for visual feedback - progressed to increased depth of foot pedal drop and decreased from B UE support to L UE support only - min assist throughout for balance along with manual facilitation for weight shifting and tactile/verbal/visual cuing for increased L LE knee extension. Performed repeated 2 sets of 5-6 repetitions sit<>stands on Kinetron with L LE foot pedal down to increase L weightshift and L LE muscle activation - mirror feedback provided throughout for L weight shift an improved upright - manual facilitation for increased anterior trunk lean to come to standing due to posterior lean/LOB. Transported to other gym in w/c. Transitioned from w/c to tall kneeling on mat table with B UE support on bench x2 with min/mod assist of 2 to getting into position. Performed repeated hip lowering/raising in tall kneeling position targeting gluteal activation with B UE support on bench, progressed to L UE support only, 2 sets of 10 repetitions with min assist throughout for  balance. Stand pivot w/c>EOM using RW with min assist for balance. Performed sit<>stand from EOM<>RW with min/mod assist for balance due to posterior lean with cuing for increased hip extension and anterior weightshifting and implementing carryover from repeated hip extension in tall kneeling. Performed standing balance task of tossing horseshoes with cuing for anterior weight shift and min assist majority of time with mod assist x3 for posterior lean/LOB. Stand pivot EOM>w/c using RW with min assist. Transported back to room in w/c and left sitting in w/c with needs in reach and seat belt alarm on.  Therapy Documentation Precautions:  Precautions Precautions: Fall Restrictions Weight Bearing Restrictions: No  Pain: Reports "slight headache" pain rated at 3-4/10 - pt reports no intervention needed. 5  Therapy/Group: Individual Therapy  Tawana Scale, PT, DPT 11/26/2018, 3:29 PM

## 2018-11-26 NOTE — Progress Notes (Signed)
Occupational Therapy Session Note  Patient Details  Name: Andre Wolfe MRN: [id number] Date of Birth: 1935-02-04  Today's Date: 11/26/2018 OT Individual Time: 1145-1230 OT Individual Time Calculation (min): 45 min    Short Term Goals: Week 3:  OT Short Term Goal 1 (Week 3): Pt will complete LB dressing, including footwear, with min assist OT Short Term Goal 2 (Week 3): Pt will complete toilet transfers with min assist  Skilled Therapeutic Interventions/Progress Updates:    Treatment session with focus on sit > stand, dynamic standing balance, and LUE NMR.  Pt received upright in w/c agreeable to therapy session.  Pt propelled w/c to therapy gym with focus on LUE strength and attention to LUE during propulsion.  Engaged in stand pivot transfers with min-mod assist as pt continues to demonstrate posterior lean initially upon standing.  Engaged in reaching and fine motor control task in standing with focus on motor control while maintaining standing balance/endurance.  Therapist providing min assist for standing balance during activity.  Pt required increased time and effort when reaching across midline and out in abduction.  Utilized dowel rod in BUE with focus on shoulder flexion, elbow extension, and internal/external rotation with use of RUE for increased symmetrical movements.  Therapist providing intermittent tactile cues at elbow for further facilitation.  Utilized zoom ball for BUE strengthening and ROM and additional focus on symmetrical ROM.  Pt returned to room and left upright in w/c with seat belt alarm on and all needs in reach.  Lunch trays arriving.  Therapy Documentation Precautions:  Precautions Precautions: Fall Restrictions Weight Bearing Restrictions: No Pain: Pain Assessment Pain Scale: 0-10 Pain Score: 0-No pain   Therapy/Group: Individual Therapy  [name] 11/26/2018, 12:41 PM

## 2018-11-26 NOTE — Progress Notes (Signed)
[facility] PHYSICAL MEDICINE & REHABILITATION PROGRESS NOTE   Subjective/Complaints:  Pt aware of SNF plans  ROS: Patient denies  nausea, vomiting, diarrhea, cough, shortness of breath or chest pain, ,   Objective:   No results found. No results for input(s): WBC, HGB, HCT, PLT in the last 72 hours. No results for input(s): NA, K, CL, CO2, GLUCOSE, BUN, CREATININE, CALCIUM in the last 72 hours.  Intake/Output Summary (Last 24 hours) at 11/26/2018 0725 Last data filed at 11/25/2018 1758 Gross per 24 hour  Intake 180 ml  Output -  Net 180 ml     Physical Exam: Vital Signs Blood pressure (!) 154/71, pulse (!) 57, temperature 97.6 F (36.4 C), temperature source Oral, resp. rate 16, height 6' (1.829 m), weight 92.3 kg, SpO2 99 %. Constitutional: No distress . Vital signs reviewed. HEENT: EOMI, oral membranes moist Neck: supple Cardiovascular: RRR without murmur. No JVD    Respiratory: CTA Bilaterally without wheezes or rales. Normal effort    GI: BS +, non-tender, non-distended   Musc: mild LBP Neurologic: Alert Motor: RUE/RLE: 5/5 proximal distal LUE/LLE: 3+/5 proximal to distal Skin: Warm and dry.  Intact.  Assessment/Plan: 1. Functional deficits secondary to recurrent R pontine infarct which require 3+ hours per day of interdisciplinary therapy in a comprehensive inpatient rehab setting.  Physiatrist is providing close team supervision and 24 hour management of active medical problems listed below.  Physiatrist and rehab team continue to assess barriers to discharge/monitor patient progress toward functional and medical goals  Care Tool:  Bathing  Bathing activity did not occur: Refused Body parts bathed by patient: Left arm, Chest, Abdomen, Front perineal area, Right upper leg, Left upper leg, Face, Right lower leg, Left lower leg, Right arm   Body parts bathed by helper: Right arm, Buttocks     Bathing assist Assist Level: Moderate Assistance - Patient 50 -  74%     Upper Body Dressing/Undressing Upper body dressing   What is the patient wearing?: Pull over shirt    Upper body assist Assist Level: Supervision/Verbal cueing    Lower Body Dressing/Undressing Lower body dressing      What is the patient wearing?: Pants     Lower body assist Assist for lower body dressing: Minimal Assistance - Patient > 75%     Toileting Toileting    Toileting assist Assist for toileting: Maximal Assistance - Patient 25 - 49%     Transfers Chair/bed transfer  Transfers assist  Chair/bed transfer activity did not occur: Safety/medical concerns  Chair/bed transfer assist level: Minimal Assistance - Patient > 75%     Locomotion Ambulation   Ambulation assist      Assist level: Moderate Assistance - Patient 50 - 74% Assistive device: Walker-rolling Max distance: 25   Walk 10 feet activity   Assist     Assist level: Moderate Assistance - Patient - 50 - 74% Assistive device: Walker-rolling   Walk 50 feet activity   Assist Walk 50 feet with 2 turns activity did not occur: Safety/medical concerns         Walk 150 feet activity   Assist Walk 150 feet activity did not occur: Safety/medical concerns         Walk 10 feet on uneven surface  activity   Assist Walk 10 feet on uneven surfaces activity did not occur: Safety/medical concerns         Wheelchair     Assist Will patient use wheelchair at discharge?: Yes Type of  Wheelchair: Agricultural engineer assist level: Supervision/Verbal cueing Max wheelchair distance: 133'    Wheelchair 50 feet with 2 turns activity    Assist        Assist Level: Supervision/Verbal cueing   Wheelchair 150 feet activity     Assist Wheelchair 150 feet activity did not occur: Safety/medical concerns(Per notes)   Assist Level: Supervision/Verbal cueing    Medical Problem List and Plan: 1.Functional deficits and left hemiparesissecondary to right pontine  infarct on 11/05/2018  Continue CIR,PT, OT, SLP2. Antithrombotics: -DVT/anticoagulation:Pharmaceutical:Other (comment)--Eliquis. -antiplatelet therapy: ASA 81 mg 3. Pain Management:tylenol prn  -OOB, ROM 4. Mood:LCSW to follow for evaluation and support. -antipsychotic agents: N/A 5. Neuropsych: This patientiscapable of making decisions on hisown behalf. 6. Skin/Wound Care:routine pressure relief measures. 7. Fluids/Electrolytes/Nutrition:Monitor I/O.  8. Dyslipidemia: On Repatha at home. 9.HTN: Monitor BID. On low dose Metoprolol bid.   Norvasc increased to 5 mg on 5/2 Vitals:   11/25/18 2006 11/26/18 0518  BP: (!) 148/62 (!) 154/71  Pulse: 64 (!) 57  Resp: 19 16  Temp: 98.1 F (36.7 C) 97.6 F (36.4 C)  SpO2: 95% 99%   Pt has elevated systolic increase to 10mg  10. Neurogenic bladder/Urinary retention:   Flomax started on 5/3,vodining well    11. Reactive depression: Now on Prozac.  12. T2DM: Monitor BS ac/hs. due to hypoglycemia, Home regimen of 70/30 insulin changed to:  35 units with breakfast   5 units with lunch - Will d/c this dose as pm hypoglycemia likely due to long acting effect from noon and short acting from 4pm combine  12 units with supper    CBG (last 3)  Recent Labs    11/25/18 1703 11/25/18 2134 11/26/18 0520  GLUCAP 79 144* 99  am CBG still on low side reduce pm 70/30 to 10U, improved may need bedtime snack       LOS: 17 days A FACE TO [place]y E  11/26/2018, 7:25 AM

## 2018-11-26 NOTE — NC FL2 (Signed)
[place] LEVEL OF CARE SCREENING TOOL     IDENTIFICATION  Patient Name: Andre Wolfe Birthdate: 20-Jan-1935 Sex: male Admission Date (Current Location): 11/09/2018  [facility] and [place] Number:  Herbalist and Address:  [facility] [place] [address] [place], [place] [address]      Provider Number: [id number]  Attending Physician Name and Address:  Charlett Blake, MD  Relative Name and Phone Number:  Lytle Michaels [phone]    Current Level of Care: Other (Comment)(Rehab) Recommended Level of Care: [place] Prior Approval Number:    Date Approved/Denied:   PASRR Number: [id number]  Discharge Plan: SNF    Current Diagnoses: Patient Active Problem List   Diagnosis Date Noted  . History of depression   . AKI (acute kidney injury) ([place])   . Diabetes mellitus type 2 in nonobese (HCC)   . Hypoglycemia   . Labile blood glucose   . Neurogenic bladder   . Urinary retention   . Bradycardia   . Right pontine stroke ([place]) 07/16/2018  . Acute ischemic stroke ([place])   . Essential hypertension   . Poorly controlled type 2 diabetes mellitus with peripheral neuropathy ([place])   . Tobacco abuse   . PAF (paroxysmal atrial fibrillation) ([place])   . Right pontine CVA ([place]) 07/13/2018  . Hypercholesterolemia 05/04/2018    Orientation RESPIRATION BLADDER Height & Weight     Self, Time, Situation, Place  Normal Incontinent(Timed toileting) Weight: 203 lb 7.8 oz (92.3 kg) Height:  6' (182.9 cm)  BEHAVIORAL SYMPTOMS/MOOD NEUROLOGICAL BOWEL NUTRITION STATUS      Continent Diet(regular diet)  AMBULATORY STATUS COMMUNICATION OF NEEDS Skin   Limited Assist Verbally Normal                       Personal Care Assistance Level of Assistance  Bathing, Dressing Bathing Assistance: Limited assistance   Dressing Assistance: Limited assistance     Functional Limitations Info             SPECIAL CARE  FACTORS FREQUENCY  PT (By licensed PT), OT (By licensed OT), Bowel and bladder program     PT Frequency: 5x week OT Frequency: 5x week Bowel and Bladder Program Frequency: Timed toileting for ballder retraining          Contractures Contractures Info: Not present    Additional Factors Info  Code Status, Allergies Code Status Info: DNR Allergies Info: crestor, lipitor,zetia           Current Medications (11/26/2018):  This is the current hospital active medication list Current Facility-Administered Medications  Medication Dose Route Frequency Provider Last Rate Last Dose  . acetaminophen (TYLENOL) tablet 325-650 mg  325-650 mg Oral Q4H PRN Bary Leriche, PA-C   650 mg at 11/22/18 0631  . alum & mag hydroxide-simeth (MAALOX/MYLANTA) 200-200-20 MG/5ML suspension 30 mL  30 mL Oral Q4H PRN Love, Pamela S, PA-C      . amLODipine (NORVASC) tablet 5 mg  5 mg Oral Daily [name], MD   5 mg at 11/26/18 0751  . apixaban (ELIQUIS) tablet 5 mg  5 mg Oral BID Bary Leriche, PA-C   5 mg at 11/26/18 0751  . aspirin EC tablet 81 mg  81 mg Oral Daily Bary Leriche, PA-C   81 mg at 11/26/18 0751  . bisacodyl (DULCOLAX) suppository 10 mg  10 mg Rectal Daily PRN Bary Leriche, PA-C      .  diphenhydrAMINE (BENADRYL) 12.5 MG/5ML elixir 12.5-25 mg  12.5-25 mg Oral Q6H PRN Love, Pamela S, PA-C      . feeding supplement (ENSURE ENLIVE) (ENSURE ENLIVE) liquid 237 mL  237 mL Oral BID BM Love, Ivan Anchors, PA-C   237 mL at 11/26/18 1404  . FLUoxetine (PROZAC) capsule 20 mg  20 mg Oral Daily Bary Leriche, PA-C   20 mg at 11/26/18 0751  . gabapentin (NEURONTIN) capsule 600 mg  600 mg Oral QHS Bary Leriche, PA-C   600 mg at 11/25/18 2124  . guaiFENesin-dextromethorphan (ROBITUSSIN DM) 100-10 MG/5ML syrup 5-10 mL  5-10 mL Oral Q6H PRN Love, Pamela S, PA-C      . insulin aspart (novoLOG) injection 0-15 Units  0-15 Units Subcutaneous TID WC Bary Leriche, PA-C   3 Units at 11/25/18 1257  . insulin  aspart (novoLOG) injection 0-5 Units  0-5 Units Subcutaneous QHS Bary Leriche, PA-C   2 Units at 11/09/18 2157  . insulin aspart protamine- aspart (NOVOLOG MIX 70/30) injection 10 Units  10 Units Subcutaneous Q supper Charlett Blake, MD   10 Units at 11/25/18 1832  . insulin aspart protamine- aspart (NOVOLOG MIX 70/30) injection 35 Units  35 Units Subcutaneous Q breakfast Bary Leriche, [place]   35 Units at 11/26/18 5035  . lidocaine (XYLOCAINE) 2 % jelly   Topical PRN Love, Pamela S, PA-C      . metoprolol tartrate (LOPRESSOR) tablet 12.5 mg  12.5 mg Oral BID Love, Pamela S, PA-C   12.5 mg at 11/26/18 0750  . pantoprazole (PROTONIX) EC tablet 40 mg  40 mg Oral Daily Bary Leriche, PA-C   40 mg at 11/26/18 0751  . polyethylene glycol (MIRALAX / GLYCOLAX) packet 17 g  17 g Oral Daily PRN Bary Leriche, PA-C   17 g at 11/18/18 2123  . polyethylene glycol (MIRALAX / GLYCOLAX) packet 17 g  17 g Oral Q supper Bary Leriche, PA-C   17 g at 11/19/18 1840  . prochlorperazine (COMPAZINE) tablet 5-10 mg  5-10 mg Oral Q6H PRN Love, Pamela S, PA-C       Or  . prochlorperazine (COMPAZINE) injection 5-10 mg  5-10 mg Intramuscular Q6H PRN Love, Pamela S, PA-C       Or  . prochlorperazine (COMPAZINE) suppository 12.5 mg  12.5 mg Rectal Q6H PRN Love, Pamela S, PA-C      . senna-docusate (Senokot-S) tablet 1 tablet  1 tablet Oral QHS PRN Bary Leriche, PA-C   1 tablet at 11/14/18 2031  . sodium chloride 0.45 % bolus 500 mL  500 mL Intravenous Once Kirsteins, Luanna Salk, MD   Stopped at 11/11/18 0957  . sodium phosphate (FLEET) 7-19 GM/118ML enema 1 enema  1 enema Rectal Once PRN Love, Ivan Anchors, PA-C      . tamsulosin (FLOMAX) capsule 0.4 mg  0.4 mg Oral QPC supper [name], MD   0.4 mg at 11/25/18 1832  . traZODone (DESYREL) tablet 25-50 mg  25-50 mg Oral QHS PRN Bary Leriche, PA-C         Discharge Medications: Please see discharge summary for a list of discharge medications.  Relevant  Imaging Results:  Relevant Lab Results:   Additional Information SSN: [id number]  Tangala Wiegert, Gardiner Rhyme, LCSW

## 2018-11-27 ENCOUNTER — Inpatient Hospital Stay (HOSPITAL_COMMUNITY): Payer: Medicare Other | Admitting: Physical Therapy

## 2018-11-27 ENCOUNTER — Inpatient Hospital Stay (HOSPITAL_COMMUNITY): Payer: Medicare Other | Admitting: Occupational Therapy

## 2018-11-27 ENCOUNTER — Inpatient Hospital Stay (HOSPITAL_COMMUNITY): Payer: Medicare Other

## 2018-11-27 LAB — GLUCOSE, CAPILLARY
Glucose-Capillary: 97 mg/dL (ref 70–99)
Glucose-Capillary: 106 mg/dL — ABNORMAL HIGH (ref 70–99)
Glucose-Capillary: 110 mg/dL — ABNORMAL HIGH (ref 70–99)
Glucose-Capillary: 131 mg/dL — ABNORMAL HIGH (ref 70–99)

## 2018-11-27 NOTE — Progress Notes (Signed)
Physical Therapy Session Note  Patient Details  Name: Andre Wolfe MRN: [id number] Date of Birth: 08-Mar-1935  Today's Date: 11/27/2018 PT Individual Time: 1310-1405 PT Individual Time Calculation (min): 55 min   Short Term Goals: Week 1:  PT Short Term Goal 1 (Week 1): pt will perform functional transfers with mod A PT Short Term Goal 1 - Progress (Week 1): Progressing toward goal PT Short Term Goal 2 (Week 1): pt will perform gait with mod A x 25' in controlled environment PT Short Term Goal 2 - Progress (Week 1): Progressing toward goal Week 2:  PT Short Term Goal 1 (Week 2): pt will perform gait in controlled environment with mod A x 25' PT Short Term Goal 1 - Progress (Week 2): Met PT Short Term Goal 2 (Week 2): pt will consistently perform transfers at mod A PT Short Term Goal 2 - Progress (Week 2): Met Week 3:  PT Short Term Goal 1 (Week 3): = LTG  Skilled Therapeutic Interventions/Progress Updates:   Pt received sitting in WC and agreeable to PT  Gait training/forced use NMR of the LLE with RW and LAFO x 42f, 967f and 2049fwith min-mod assist and WC follow for safety. Moderate cues for step width on the L and improved upright posture to prevent Lateral L LOB and improve weight shifting.   Kinetron BLE NMR seated at 30cm/sec 1 min x 4 with rest break between bouts.  Cues for symmetry and full ROM on the L. Standing balance on kinetron at 20cm/sec 4 x 30sec with 1-2 UE support. Reciprocal marches 4 x 30 sec. Forward reach while maintaining equal WB x 10 BUE and min assist from PT.   Stand pivot and ambulatory [facility] assist  and RW throughout treatment. Cues for posture and gait pattern to prevent posterior LOB   WC mobility with RLE and BUE x [id number] assist from PT with min cues for direction and WC parts management to improve safety.   Pt returned to room and transferred to bed as listed above. Sit>supine completed with out assist,  and left supine in bed with call bell in reach and all needs met.         Therapy Documentation Precautions:  Precautions Precautions: Fall Restrictions Weight Bearing Restrictions: No Pain:   denies    Therapy/Group: Individual Therapy  AustLorie Phenix5/2020, 2:10 PM

## 2018-11-27 NOTE — Progress Notes (Signed)
Occupational Therapy Session Note  Patient Details  Name: Andre Wolfe MRN: [id number] Date of Birth: [birth year]  Today's Date: 11/27/2018 OT Individual Time: 4650-3546 OT Individual Time Calculation (min): 60 min    Short Term Goals: Week 3:  OT Short Term Goal 1 (Week 3): Pt will complete LB dressing, including footwear, with min assist OT Short Term Goal 2 (Week 3): Pt will complete toilet transfers with min assist  Skilled Therapeutic Interventions/Progress Updates:    Treatment session with focus on ADL retraining with functional transfers and bathing/dressing. Pt received supine in bed agreeable to shower.  Completed bed mobility with CGA with increased time.  Sit > stand at EOB with mod assist with noted increased posterior lean.  Stand pivot transfer mod assist due to posterior lean and difficulty weight shifting during transfer.  Completed stand pivot transfer w/c > shower seat in room shower with min assist and use of grab bars to facilitate anterior weight shift. Bathing completed at sit > stand level with pt able to wash all body parts except buttocks, utilizing LUE during bathing even able to wash Rt under arm.  Completed sit > stand with min assist and therapist washed buttocks while pt maintained standing.  Grooming completed in sitting with use of LUE at diminished level when opening items. LB dressing completed with increased time, but pt able to complete with min assist with therapist pulling pants over hips due to urgency with need to toilet while pt maintained standing balance with CGA.  Therapist donned socks this session due to need to toilet.  Completed stand pivot transfer to [facility] over toilet with min assist.  Pt completed clothing management in standing with min assist for standing balance.  Pt left upright in w/c with seat belt alarm on and all needs in reach.  Therapy Documentation Precautions:  Precautions Precautions: Fall Restrictions Weight Bearing Restrictions:  No General:   Vital Signs: Therapy Vitals Pulse Rate: (!) 54 BP: (!) 150/59 Patient Position (if appropriate): Lying Oxygen Therapy SpO2: 94 % O2 Device: Room Air Pain:  Pt with no c/o pain   Therapy/Group: Individual Therapy  [name] 11/27/2018, 8:07 AM

## 2018-11-27 NOTE — Progress Notes (Signed)
Social Work Patient ID: Andre Wolfe, male   DOB: 1935-02-08, 84 y.o.   MRN: [id number]  Spoke with Becky-daughter to ask if plan has changed to going home. Daughter reports the plan has not changed and it is still NHP. Will continue to pursue a bed for him.

## 2018-11-27 NOTE — Progress Notes (Signed)
Occupational Therapy Session Note  Patient Details  Name: Andre Wolfe MRN: [id number] Date of Birth: Jan 17, 1935  Today's Date: 11/27/2018 OT Individual Time: 1000-1058 OT Individual Time Calculation (min): 58 min    Short Term Goals: Week 3:  OT Short Term Goal 1 (Week 3): Pt will complete LB dressing, including footwear, with min assist OT Short Term Goal 2 (Week 3): Pt will complete toilet transfers with min assist  Skilled Therapeutic Interventions/Progress Updates:    Pt resting in w/c upon arrival and agreeable to therapy.  OT intervention with focus on LUE functional use, w/c mobility, attention to task, and safety awareness to increase independence with BADLs.  PT engaged in LUE [facility] and gross motor tasks at table, including peg board pattern, clothes pins, nuts and bolts, in addition to BUE tasks such as folding wash cloths.  Pt required min verbal cues to use LUE during tasks.  Pt with improved gross motor and [place] in open chain tasks, but c/o fatigue and requiring several rest breaks. Pt propelled w/c approx 100'.  Pt returned to room and remained in w/c with all needs within reach and belt alarm activated.   Therapy Documentation Precautions:  Precautions Precautions: Fall Restrictions Weight Bearing Restrictions: No  Pain: Pain Assessment Pain Scale: 0-10 Pain Score: 0-No pain   Therapy/Group: Individual Therapy  [name] 11/27/2018, 11:00 AM

## 2018-11-27 NOTE — Progress Notes (Addendum)
PHYSICAL MEDICINE & REHABILITATION PROGRESS NOTE   Subjective/Complaints:  Pt states new plan is home on Monday  ROS: Patient denies  nausea, vomiting, diarrhea, cough, shortness of breath or chest pain, ,   Objective:   No results found. No results for input(s): WBC, HGB, HCT, PLT in the last 72 hours. No results for input(s): NA, K, CL, CO2, GLUCOSE, BUN, CREATININE, CALCIUM in the last 72 hours.  Intake/Output Summary (Last 24 hours) at 11/27/2018 0747 Last data filed at 11/26/2018 1829 Gross per 24 hour  Intake 720 ml  Output -  Net 720 ml     Physical Exam: Vital Signs Blood pressure (!) 150/59, pulse (!) 54, temperature 97.6 F (36.4 C), resp. rate 17, height 6' (1.829 m), weight 92.3 kg, SpO2 94 %. Constitutional: No distress . Vital signs reviewed. HEENT: EOMI, oral membranes moist Neck: supple Cardiovascular: RRR without murmur. No JVD    Respiratory: CTA Bilaterally without wheezes or rales. Normal effort    GI: BS +, non-tender, non-distended   Musc: mild LBP Neurologic: Alert Motor: RUE/RLE: 5/5 proximal distal LUE/LLE: 3+/5 proximal to distal Skin: Warm and dry.  Intact.  Assessment/Plan: 1. Functional deficits secondary to recurrent R pontine infarct which require 3+ hours per day of interdisciplinary therapy in a comprehensive inpatient rehab setting.  Physiatrist is providing close team supervision and 24 hour management of active medical problems listed below.  Physiatrist and rehab team continue to assess barriers to discharge/monitor patient progress toward functional and medical goals  Care Tool:  Bathing  Bathing activity did not occur: Refused Body parts bathed by patient: Left arm, Chest, Abdomen, Front perineal area, Right upper leg, Left upper leg, Face, Right lower leg, Left lower leg, Right arm   Body parts bathed by helper: Right arm, Buttocks     Bathing assist Assist Level: Moderate Assistance - Patient 50 - 74%      Upper Body Dressing/Undressing Upper body dressing   What is the patient wearing?: Pull over shirt    Upper body assist Assist Level: Supervision/Verbal cueing    Lower Body Dressing/Undressing Lower body dressing      What is the patient wearing?: Pants     Lower body assist Assist for lower body dressing: Minimal Assistance - Patient > 75%     Toileting Toileting    Toileting assist Assist for toileting: Maximal Assistance - Patient 25 - 49%     Transfers Chair/bed transfer  Transfers assist  Chair/bed transfer activity did not occur: Safety/medical concerns  Chair/bed transfer assist level: Moderate Assistance - Patient 50 - 74%(stand pivot with RW)     Locomotion Ambulation   Ambulation assist      Assist level: Moderate Assistance - Patient 50 - 74% Assistive device: Walker-rolling Max distance: 60   Walk 10 feet activity   Assist     Assist level: Moderate Assistance - Patient - 50 - 74% Assistive device: Walker-rolling, Orthosis   Walk 50 feet activity   Assist Walk 50 feet with 2 turns activity did not occur: Safety/medical concerns  Assist level: Moderate Assistance - Patient - 50 - 74% Assistive device: Walker-rolling, Orthosis    Walk 150 feet activity   Assist Walk 150 feet activity did not occur: Safety/medical concerns         Walk 10 feet on uneven surface  activity   Assist Walk 10 feet on uneven surfaces activity did not occur: Safety/medical concerns  Wheelchair     Assist Will patient use wheelchair at discharge?: Yes Type of Wheelchair: Manual    Wheelchair assist level: Supervision/Verbal cueing Max wheelchair distance: 133'    Wheelchair 50 feet with 2 turns activity    Assist        Assist Level: Supervision/Verbal cueing   Wheelchair 150 feet activity     Assist Wheelchair 150 feet activity did not occur: Safety/medical concerns(Per notes)   Assist Level:  Supervision/Verbal cueing    Medical Problem List and Plan: 1.Functional deficits and left hemiparesissecondary to right pontine infarct on 11/05/2018  Continue CIR,PT, OT, SLP2. Antithrombotics: -DVT/anticoagulation:Pharmaceutical:Other (comment)--Eliquis. -antiplatelet therapy: ASA 81 mg 3. Pain Management:tylenol prn  -OOB, ROM 4. Mood:LCSW to follow for evaluation and support. -antipsychotic agents: N/A 5. Neuropsych: This patientiscapable of making decisions on hisown behalf. 6. Skin/Wound Care:routine pressure relief measures. 7. Fluids/Electrolytes/Nutrition:Monitor I/O.  8. Dyslipidemia: On Repatha at home. 9.HTN: Monitor BID. On low dose Metoprolol bid.   Norvasc increased to 5 mg on 5/2 Vitals:   11/26/18 1938 11/27/18 0608  BP: 134/62 (!) 150/59  Pulse: 62 (!) 54  Resp: 17   Temp: 97.6 F (36.4 C)   SpO2: 98% 94%   Pt has elevated systolic increase amlodipine  to 10mg  10. Neurogenic bladder/Urinary retention:   Flomax started on 5/3,vodining well    11. Reactive depression: Now on Prozac.  12. T2DM: Monitor BS ac/hs. due to hypoglycemia, Home regimen of 70/30 insulin changed to:  35 units with breakfast   5 units with lunch - Will d/c this dose as pm hypoglycemia likely due to long acting effect from noon and short acting from 4pm combine  12 units with supper    CBG (last 3)  Recent Labs    11/26/18 1649 11/26/18 2103 11/27/18 0619  GLUCAP 170* 134* 97  am CBG still on low side reduce pm 70/30 to 10U, improved 5/15      LOS: 18 days A FACE TO [place] E Shondrea Steinert 11/27/2018, 7:47 AM

## 2018-11-28 ENCOUNTER — Inpatient Hospital Stay (HOSPITAL_COMMUNITY): Payer: Medicare Other

## 2018-11-28 LAB — GLUCOSE, CAPILLARY
Glucose-Capillary: 82 mg/dL (ref 70–99)
Glucose-Capillary: 88 mg/dL (ref 70–99)
Glucose-Capillary: 92 mg/dL (ref 70–99)
Glucose-Capillary: 170 mg/dL — ABNORMAL HIGH (ref 70–99)

## 2018-11-28 NOTE — Progress Notes (Signed)
Physical Therapy Session Note  Patient Details  Name: JOBANI SABADO MRN: [id number] Date of Birth: [birth year]  Today's Date: 11/28/2018 PT Individual Time: 1032-1059 PT Individual Time Calculation (min): 27 min   Short Term Goals: Week 3:  PT Short Term Goal 1 (Week 3): = LTG  Skilled Therapeutic Interventions/Progress Updates: Pt presented in bed agreeable to therapy. Performed supine to sit with minA and use of bed features. Performed squat pivot to w/c modA with poor clearance. Pt requesting to brush teeth and wash face. Performed oral hygiene in standing with minA and cues for increasing TKE in standing with LUE supported on sink. Pt required mod cues to maintain midline due to increasing L lean. Pt washed face sitting in w/c with set up. Pt transported to hallway and participated in gait training 60f and 316fx 2 with w/c follow and min/modA. Pt required cues for increasing quad activation in stance phase, appropriate wt shifting to R to allow L foot clearance, increasing BOS, and decreasing L lean. Pt propelled back to room after ambulation using BLE with moderate verbal cues for using LLE. Pt remained in w/c at end of session and left with belt alarm on, call bell within reach and needs met.      Therapy Documentation Precautions:  Precautions Precautions: Fall Restrictions Weight Bearing Restrictions: No    Therapy/Group: Individual Therapy  Wyatte Dames  Zeriyah Wain, PTA  11/28/2018, 4:17 PM

## 2018-11-28 NOTE — Progress Notes (Addendum)
Andre Wolfe is a 84 y.o. male 01-01-1935 [id number]  Subjective: No new complaints. No new problems. Slept well. Feeling OK.  Objective: Vital signs in last 24 hours: Temp:  [97.8 F (36.6 C)-98 F (36.7 C)] 98 F (36.7 C) (05/16 1700) Pulse Rate:  [53-54] 53 (05/16 0458) Resp:  [15-20] 20 (05/16 1700) BP: (145-152)/(63-69) 145/63 (05/16 0458) SpO2:  [99 %] 99 % (05/16 0458) Weight change:  Last BM Date: 11/28/18  Intake/Output from previous day: 05/15 0701 - 05/16 0700 In: 93 [P.O.:960] Out: 100 [Urine:100] Last cbgs: CBG (last 3)  Recent Labs    11/28/18 0618 11/28/18 1139 11/28/18 1728  GLUCAP 82 88 92     Physical Exam General: No apparent distress   HEENT: not dry Lungs: Normal effort. Lungs clear to auscultation, no crackles or wheezes. Cardiovascular: Regular rate and rhythm, no edema Abdomen: S/NT/ND; BS(+) Musculoskeletal:  unchanged Neurological: No new neurological deficits Wounds: N/A    Skin: clear  Aging changes Mental state: Alert, oriented, cooperative    Lab Results: BMET    Component Value Date/Time   NA 136 11/23/2018 0643   K 4.3 11/23/2018 0643   CL 104 11/23/2018 0643   CO2 22 11/23/2018 0643   GLUCOSE 143 (H) 11/23/2018 0643   BUN 19 11/23/2018 0643   CREATININE 1.09 11/23/2018 0643   CALCIUM 9.3 11/23/2018 0643   GFRNONAA >60 11/23/2018 0643   GFRAA >60 11/23/2018 0643   CBC    Component Value Date/Time   WBC 8.0 11/23/2018 0643   RBC 5.40 11/23/2018 0643   HGB 15.8 11/23/2018 0643   HCT 46.2 11/23/2018 0643   PLT 230 11/23/2018 0643   MCV 85.6 11/23/2018 0643   MCH 29.3 11/23/2018 0643   MCHC 34.2 11/23/2018 0643   RDW 12.6 11/23/2018 0643   LYMPHSABS 1.7 11/10/2018 0518   MONOABS 0.9 11/10/2018 0518   EOSABS 0.3 11/10/2018 0518   BASOSABS 0.1 11/10/2018 0518    Studies/Results: No results found.  Medications: I have reviewed the patient's current medications.  Assessment/Plan:  1.  Status post right  pontine infarct on 11/05/2018 with functional deficits and left hemiparesis.  CIR 2.  DVT prophylaxis with Eliquis 3.  Pain management with PRN Tylenol 4.  Emotional support 5.  Routine skin care 6.  Dyslipidemia.  On Repatha at home 7.  Hypertension.  Metoprolol twice daily.  Norvasc. 8.  Reactive depression.  On Prozac 9.  Neurogenic bladder/urinary retention.  On Flomax 10.  Type 2 diabetes Home regimen of 70/30 insulin was changed to 35 units with breakfast and 5 units with lunch, and 12 units with supper                               Length of stay, days: [place] , MD 11/28/2018, 5:53 PM

## 2018-11-29 ENCOUNTER — Inpatient Hospital Stay (HOSPITAL_COMMUNITY): Payer: Medicare Other | Admitting: Occupational Therapy

## 2018-11-29 LAB — GLUCOSE, CAPILLARY
Glucose-Capillary: 92 mg/dL (ref 70–99)
Glucose-Capillary: 157 mg/dL — ABNORMAL HIGH (ref 70–99)
Glucose-Capillary: 66 mg/dL — ABNORMAL LOW (ref 70–99)
Glucose-Capillary: 140 mg/dL — ABNORMAL HIGH (ref 70–99)
Glucose-Capillary: 84 mg/dL (ref 70–99)
Glucose-Capillary: 185 mg/dL — ABNORMAL HIGH (ref 70–99)

## 2018-11-29 NOTE — Progress Notes (Signed)
Hypoglycemic Event  CBG: 66  Treatment: orange juice  Symptoms: none  Follow-up CBG: Time:1345 CBG Result:140  Possible Reasons for Event: unknown  Comments/MD notified: Protocol followed.    [name]

## 2018-11-29 NOTE — Progress Notes (Signed)
Occupational Therapy Session Note  Patient Details  Name: Andre Wolfe MRN: [id number] Date of Birth: 01-Feb-1935  Today's Date: 11/29/2018 OT Individual Time: 1345-1445 OT Individual Time Calculation (min): 60 min    Short Term Goals: Week 3:  OT Short Term Goal 1 (Week 3): Pt will complete LB dressing, including footwear, with min assist OT Short Term Goal 2 (Week 3): Pt will complete toilet transfers with min assist  Skilled Therapeutic Interventions/Progress Updates:    Patient seated in w/c and ready for therapy session.  Patient able to self propel w/c for short distances.  Completed 2 x 4 minutes on LB Kinetron stepping device.  Completed standing balance, trunk mobility and stepping tasks with min A to maintain balance with right UE support, occ mod A with no UE support.  Focus on symmetrical hips and trunk, equal weight bearing on LEs and trunk ROM.  Completed LB stretching activities in seated position, UB stretching and AROM with focus on ER and scapular positioning.  Completed dexterity activity and grasp/release of left hand.  Patient returned to bed at close of session with mod A for SPT with posterior lean.  Min A for SSP to supine.  Patient resting in semi reclined position in bed at close of session with bed alarm set and call bell in reach  Therapy Documentation Precautions:  Precautions Precautions: Fall Restrictions Weight Bearing Restrictions: No General:   Vital Signs:  Pain: Pain Assessment Pain Scale: 0-10 Pain Score: 0-No pain Other Treatments:     Therapy/Group: Individual Therapy  Carlos Levering 11/29/2018, 4:00 PM

## 2018-11-29 NOTE — Progress Notes (Signed)
Andre Wolfe is a 84 y.o. male Dec 22, 1934 [id number]  Subjective: No new complaints. No new problems. Slept well. Feeling OK.  Objective: Vital signs in last 24 hours: Temp:  [97.6 F (36.4 C)-98 F (36.7 C)] 97.7 F (36.5 C) (05/17 0528) Pulse Rate:  [51-52] 52 (05/17 0537) Resp:  [16-20] 18 (05/17 0528) BP: (147-154)/(65-69) 147/69 (05/17 0537) SpO2:  [98 %-99 %] 98 % (05/17 0528) Weight change:  Last BM Date: 11/28/18  Intake/Output from previous day: 05/16 0701 - 05/17 0700 In: 100 [P.O.:100] Out: -  Last cbgs: CBG (last 3)  Recent Labs    11/28/18 1139 11/28/18 1728 11/28/18 2112  GLUCAP 88 92 170*     Physical Exam General: No apparent distress   HEENT: not dry Lungs: Normal effort. Lungs clear to auscultation, no crackles or wheezes. Cardiovascular: Regular rate and rhythm, no edema Abdomen: S/NT/ND; BS(+) Musculoskeletal:  unchanged Neurological: No new neurological deficits Wounds: N/A    Skin: clear  Aging changes Mental state: Alert, cooperative    Lab Results: BMET    Component Value Date/Time   NA 136 11/23/2018 0643   K 4.3 11/23/2018 0643   CL 104 11/23/2018 0643   CO2 22 11/23/2018 0643   GLUCOSE 143 (H) 11/23/2018 0643   BUN 19 11/23/2018 0643   CREATININE 1.09 11/23/2018 0643   CALCIUM 9.3 11/23/2018 0643   GFRNONAA >60 11/23/2018 0643   GFRAA >60 11/23/2018 0643   CBC    Component Value Date/Time   WBC 8.0 11/23/2018 0643   RBC 5.40 11/23/2018 0643   HGB 15.8 11/23/2018 0643   HCT 46.2 11/23/2018 0643   PLT 230 11/23/2018 0643   MCV 85.6 11/23/2018 0643   MCH 29.3 11/23/2018 0643   MCHC 34.2 11/23/2018 0643   RDW 12.6 11/23/2018 0643   LYMPHSABS 1.7 11/10/2018 0518   MONOABS 0.9 11/10/2018 0518   EOSABS 0.3 11/10/2018 0518   BASOSABS 0.1 11/10/2018 0518    Studies/Results: No results found.  Medications: I have reviewed the patient's current medications.  Assessment/Plan:  1.  Status post right pontine infarct on  11/05/2018 with functional deficits and left hemiparesis.  CIR 2.  DVT prophylaxis with Eliquis 3.  Pain management with PRN Tylenol 4.  Emotional support 5.  Routine skin care 6.  Dyslipidemia.  She was using  Repatha at home 7.  Hypertension.  Metoprolol, Norvasc 8.  Reactive depression.  On Prozac 9.  Neurogenic bladder/urinary retention.  On Flomax 10.  Type 2 diabetes.  Home regimen includes 70/30 insulin that was changed to 35 units with breakfast, 5 units with lunch, 12 units with supper    Length of stay, days: 20  Walker Kehr , MD 11/29/2018, 11:19 AM

## 2018-11-30 ENCOUNTER — Inpatient Hospital Stay (HOSPITAL_COMMUNITY): Payer: Medicare Other

## 2018-11-30 ENCOUNTER — Inpatient Hospital Stay (HOSPITAL_COMMUNITY): Payer: Medicare Other | Admitting: Occupational Therapy

## 2018-11-30 ENCOUNTER — Inpatient Hospital Stay (HOSPITAL_COMMUNITY): Payer: Medicare Other | Admitting: Physical Therapy

## 2018-11-30 LAB — CBC
HCT: 42.1 % (ref 39.0–52.0)
Hemoglobin: 14.5 g/dL (ref 13.0–17.0)
MCH: 29.7 pg (ref 26.0–34.0)
MCHC: 34.4 g/dL (ref 30.0–36.0)
MCV: 86.3 fL (ref 80.0–100.0)
Platelets: 208 10*3/uL (ref 150–400)
RBC: 4.88 MIL/uL (ref 4.22–5.81)
RDW: 12.4 % (ref 11.5–15.5)
WBC: 6 10*3/uL (ref 4.0–10.5)
nRBC: 0 % (ref 0.0–0.2)

## 2018-11-30 LAB — GLUCOSE, CAPILLARY
Glucose-Capillary: 100 mg/dL — ABNORMAL HIGH (ref 70–99)
Glucose-Capillary: 72 mg/dL (ref 70–99)
Glucose-Capillary: 97 mg/dL (ref 70–99)
Glucose-Capillary: 153 mg/dL — ABNORMAL HIGH (ref 70–99)

## 2018-11-30 LAB — BASIC METABOLIC PANEL
Anion gap: 11 (ref 5–15)
BUN: 20 mg/dL (ref 8–23)
CO2: 21 mmol/L — ABNORMAL LOW (ref 22–32)
Calcium: 8.8 mg/dL — ABNORMAL LOW (ref 8.9–10.3)
Chloride: 104 mmol/L (ref 98–111)
Creatinine, Ser: 1.2 mg/dL (ref 0.61–1.24)
GFR calc Af Amer: >60 mL/min (ref >60)
GFR calc non Af Amer: 56 mL/min — ABNORMAL LOW (ref >60)
Glucose, Bld: 97 mg/dL (ref 70–99)
Potassium: 4.1 mmol/L (ref 3.5–5.1)
Sodium: 136 mmol/L (ref 135–145)

## 2018-11-30 NOTE — Progress Notes (Signed)
Occupational Therapy Session Note  Patient Details  Name: Andre Wolfe MRN: [id number] Date of Birth: [birth year]  Today's Date: 11/30/2018 OT Individual Time: 4098-1191 OT Individual Time Calculation (min): 58 min    Short Term Goals: Week 3:  OT Short Term Goal 1 (Week 3): Pt will complete LB dressing, including footwear, with min assist OT Short Term Goal 2 (Week 3): Pt will complete toilet transfers with min assist  Skilled Therapeutic Interventions/Progress Updates:    Treatment session with focus on ADL retraining and functional transfers.  Pt received supine in bed with no c/o pain.  Pt unable to recall events of fall, may have been dreaming.  Pt still wanting to go home, but understands that his wife is unable to provide current level of care.  Completed bed mobility with min assist and stand pivot transfer to w/c mod assist.  Pt demonstrating increased posterior lean this session (most likely due to early morning), requiring multimodal cues to correct.  Engaged in bathing/dressing at sit > stand level with mod cues for hand placement and sequencing during sit <> stand.  Therapist assisted with pulling pants over hips as pt unable to maintain standing balance without BUE support - able to release RUE for short time but unable to pull pants over hips before losing balance.  Pt able to maintain static standing balance with CGA while therapist pulled pants over hips.  Pt reports need to toilet.  Completed transfer min assist to Rt and mod assist when transferring back to Lt.  Again requiring assistance for clothing management post toileting.  Pt remained upright in w/c with seat belt alarm on and all needs in reach.  Therapy Documentation Precautions:  Precautions Precautions: Fall Restrictions Weight Bearing Restrictions: No General:   Vital Signs: Therapy Vitals Temp: 97.6 F (36.4 C) Pulse Rate: (!) 52 Resp: 18 BP: 109/60 Patient Position (if appropriate): Sitting Oxygen  Therapy SpO2: 99 % O2 Device: Room Air Pain: Pain Assessment Pain Scale: 0-10 Pain Score: 0-No pain   Therapy/Group: Individual Therapy  [name] 11/30/2018, 9:18 AM

## 2018-11-30 NOTE — Progress Notes (Signed)
Physical Therapy Session Note  Patient Details  Name: Andre Wolfe MRN: [id number] Date of Birth: 08/27/1934  Today's Date: 11/30/2018 PT Individual Time: 1445-1544 PT Individual Time Calculation (min): 59 min   Short Term Goals: Week 3:  PT Short Term Goal 1 (Week 3): = LTG  Skilled Therapeutic Interventions/Progress Updates: Pt presented in bed agreeable to therapy. Performed supine to sit with minA and use of bed features. Performed squat pivot to w/c modA. Pt transported to rehab gym for time management. Performed ambulatory transfer to mat with RW. Participated in ball taps in standing with mod challenges with pt maintaining midline and no significant increased L lean. Participated in gait traninging 57ft x 2 with seated rest. Gait modA with multimodal cues for increasing R wt shift to improve LLE foot clearance and verbal cues for decreased L step length. Pt also participated in hamstring stretch standing on red wedge 2 x 2 min bouts. Transported to day room and participated in Cybex Kinetron 50cm/sec from w/c x 5 min for forced use of LLE. Pt propelled approx 200ft towards room and transported remaining distance. Performed squat pivot to bed minA and returned to supine CGA with use of features. Pt left with bed alarm on, call bell within reach and needs met.      Therapy Documentation Precautions:  Precautions Precautions: Fall Restrictions Weight Bearing Restrictions: No General:   Vital Signs: Therapy Vitals Temp: 98.2 F (36.8 C) Pulse Rate: (!) 51 Resp: (!) 21 BP: 130/62 Patient Position (if appropriate): Lying Oxygen Therapy SpO2: 97 % O2 Device: Room Air    Therapy/Group: Individual Therapy  Rosita DeChalus  Rosita DeChalus, PTA  11/30/2018, 4:30 PM  

## 2018-11-30 NOTE — Progress Notes (Signed)
Occupational Therapy Session Note  Patient Details  Name: ERIQ HUFFORD MRN: [id number] Date of Birth: 16-Nov-1934  Today's Date: 11/30/2018 OT Individual Time: 1000-1100 OT Individual Time Calculation (min): 60 min    Short Term Goals: Week 3:  OT Short Term Goal 1 (Week 3): Pt will complete LB dressing, including footwear, with min assist OT Short Term Goal 2 (Week 3): Pt will complete toilet transfers with min assist  Skilled Therapeutic Interventions/Progress Updates:    Pt sleeping in bed upon arrival and easily aroused.  Pt agreeable to therapy and sat EOB using bed rails with supervision.  Pt required mod A for squat pivot transfer to w/c.  Pt initially engaged in folding his laundry (pants, shirts, etc.). Pt stated he never folded clothes before but attempted to complete task.  Pt used BUE for task.  Pt transitioned to gym and engaged in table tasks with pipe tree.  Pt required mod verbal cues but completed task without any physical assistance.  Pt was also able to fold wash cloths correctly using BUE to complete task.  Pt propelled w/c from gym to room with supervison and mod verbal cues for techniques and using LUE and safety to prevent running into all on L.  Pt remained in w/c with all needs within reach and belt alarm activated.   Therapy Documentation Precautions:  Precautions Precautions: Fall Restrictions Weight Bearing Restrictions: No General:   Vital Signs: Therapy Vitals Temp: 97.6 F (36.4 C) Pulse Rate: (!) 52 Resp: 18 BP: 109/60 Patient Position (if appropriate): Sitting Oxygen Therapy SpO2: 99 % O2 Device: Room Air Pain: Pain Assessment Pain Scale: 0-10 Pain Score: 0-No pain ADL:   Vision   Perception    Praxis   Exercises:   Other Treatments:     Therapy/Group: Individual Therapy  Andre Wolfe 11/30/2018, 11:05 AM

## 2018-11-30 NOTE — Progress Notes (Signed)
   11/30/18 0533  What Happened  Was fall witnessed? No  Was patient injured? No  Patient found on floor  Found by Staff-comment  Stated prior activity other (comment) (trying to pick his blancket from the floor)  Follow Up  MD notified Linna Hoff (PA)  Time MD notified [phone]  Family notified Yes-comment  Time family notified [phone]  Additional tests No  Adult Fall Risk Assessment  Risk Factor Category (scoring not indicated) High fall risk per protocol (document High fall risk)  Patient Fall Risk Level High fall risk  Adult Fall Risk Interventions  Required Bundle Interventions *See Row Information* High fall risk - low, moderate, and high requirements implemented  Additional Interventions Lap belt while in chair/wheelchair;Use of appropriate toileting equipment (bedpan, BSC, etc.)  Screening for Fall Injury Risk (To be completed on HIGH fall risk patients) - Assessing Need for Low Bed  Risk For Fall Injury- Low Bed Criteria None identified - Continue screening  Screening for Fall Injury Risk (To be completed on HIGH fall risk patients who do not meet crieteria for Low Bed) - Assessing Need for Floor Mats Only  Risk For Fall Injury- Criteria for Floor Mats Confusion/dementia (+NuDESC, CIWA, TBI, etc.)  Will Implement Floor Mats Yes  Vitals  Temp 97.7 F (36.5 C)  BP (!) 141/64  MAP (mmHg) 83  BP Location Left Arm  BP Method Automatic  Patient Position (if appropriate) Lying  Pulse Rate (!) 57  Pulse Rate Source Monitor  Resp (!) 22  Oxygen Therapy  SpO2 98 %  O2 Device Room Air  Pain Assessment  Pain Scale 0-10  Pain Score 0  PCA/Epidural/Spinal Assessment  Respiratory Pattern Regular;Unlabored  Neurological  Neuro (WDL) X  Level of Consciousness Alert  Orientation Level Oriented to person;Oriented to place;Oriented to time  Cognition Appropriate at baseline  Speech Clear  Pupil Assessment  Yes  R Pupil Size (mm) 3  R Pupil Shape Round  R Pupil Reaction Brisk  L Pupil  Size (mm) 3  L Pupil Shape Round  L Pupil Reaction Brisk  Additional Pupil Assessments No  Motor Function/Sensation Assessment Grip  R Hand Grip Strong  L Hand Grip Strong  R Foot Dorsiflexion Moderate  L Foot Dorsiflexion Moderate  R Foot Plantar Flexion Moderate  L Foot Plantar Flexion Moderate  RUE Motor Response Purposeful movement  RUE Sensation Full sensation  RUE Motor Strength 5  LUE Motor Response Purposeful movement  LUE Sensation Full sensation  LUE Motor Strength 5  RLE Motor Response Purposeful movement  RLE Sensation Full sensation  RLE Motor Strength 4  LLE Motor Response Purposeful movement  LLE Sensation Full sensation  LLE Motor Strength 3  Neuro Symptoms None  Musculoskeletal  Musculoskeletal (WDL) X  Assistive Device Wheelchair;Stedy  Generalized Weakness Yes  Weight Bearing Restrictions No  Integumentary  Integumentary (WDL) WDL  Skin Color Appropriate for ethnicity  Skin Condition Dry  Skin Integrity Intact  Skin Turgor Non-tenting

## 2018-11-30 NOTE — Progress Notes (Addendum)
[place] PHYSICAL MEDICINE & REHABILITATION PROGRESS NOTE   Subjective/Complaints: Andre Wolfe out of bed, unwitnessed, pt does not have any recall of the situation.  No pains, no HA no abrasions   ROS: Patient denies  nausea, vomiting, diarrhea, cough, shortness of breath or chest pain, ,   Objective:   No results found. Recent Labs    11/30/18 0659  WBC 6.0  HGB 14.5  HCT 42.1  PLT 208   No results for input(s): NA, K, CL, CO2, GLUCOSE, BUN, CREATININE, CALCIUM in the last 72 hours. No intake or output data in the 24 hours ending 11/30/18 0751   Physical Exam: Vital Signs Blood pressure 132/60, pulse (!) 49, temperature (!) 97.5 F (36.4 C), resp. rate 18, height 6' (1.829 m), weight 92.3 kg, SpO2 98 %. Constitutional: No distress . Vital signs reviewed. HEENT: EOMI, oral membranes moist, AT, [place]: supple Cardiovascular: RRR without murmur. No JVD    Respiratory: CTA Bilaterally without wheezes or rales. Normal effort    GI: BS +, non-tender, non-distended   Musc: mild LBP Neurologic: Alert Motor: RUE/RLE: 5/5 proximal distal LUE/LLE: 3+/5 proximal to distal Skin: Warm and dry.  Intact.  Assessment/Plan: 1. Functional deficits secondary to recurrent R pontine infarct which require 3+ hours per day of interdisciplinary therapy in a comprehensive inpatient rehab setting.  Physiatrist is providing close team supervision and 24 hour management of active medical problems listed below.  Physiatrist and rehab team continue to assess barriers to discharge/monitor patient progress toward functional and medical goals  Care Tool:  Bathing  Bathing activity did not occur: Refused Body parts bathed by patient: Left arm, Chest, Abdomen, Front perineal area, Right upper leg, Left upper leg, Face, Right lower leg, Left lower leg, Right arm   Body parts bathed by helper: Buttocks     Bathing assist Assist Level: Minimal Assistance - Patient > 75%     Upper Body  Dressing/Undressing Upper body dressing   What is the patient wearing?: Pull over shirt    Upper body assist Assist Level: Supervision/Verbal cueing    Lower Body Dressing/Undressing Lower body dressing      What is the patient wearing?: Pants     Lower body assist Assist for lower body dressing: Minimal Assistance - Patient > 75%     Toileting Toileting    Toileting assist Assist for toileting: Moderate Assistance - Patient 50 - 74%     Transfers Chair/bed transfer  Transfers assist  Chair/bed transfer activity did not occur: Safety/medical concerns  Chair/bed transfer assist level: Moderate Assistance - Patient 50 - 74%     Locomotion Ambulation   Ambulation assist      Assist level: Moderate Assistance - Patient 50 - 74% Assistive device: Orthosis Max distance: 32   Walk 10 feet activity   Assist     Assist level: Moderate Assistance - Patient - 50 - 74% Assistive device: Walker-rolling, Orthosis   Walk 50 feet activity   Assist Walk 50 feet with 2 turns activity did not occur: Safety/medical concerns  Assist level: Moderate Assistance - Patient - 50 - 74% Assistive device: Walker-rolling, Orthosis    Walk 150 feet activity   Assist Walk 150 feet activity did not occur: Safety/medical concerns         Walk 10 feet on uneven surface  activity   Assist Walk 10 feet on uneven surfaces activity did not occur: Safety/medical concerns         Wheelchair  Assist Will patient use wheelchair at discharge?: Yes Type of Wheelchair: Manual    Wheelchair assist level: Supervision/Verbal cueing Max wheelchair distance: 31'    Wheelchair 50 feet with 2 turns activity    Assist        Assist Level: Supervision/Verbal cueing   Wheelchair 150 feet activity     Assist Wheelchair 150 feet activity did not occur: Safety/medical concerns(Per notes)   Assist Level: Supervision/Verbal cueing    Medical Problem List and  Plan: 1.Functional deficits and left hemiparesissecondary to right pontine infarct on 11/05/2018  Continue CIR,PT, OT, SLP Unwitnessed fall on Eliquis and ASA- check CT head 2. Antithrombotics: -DVT/anticoagulation:Pharmaceutical:Other (comment)--Eliquis. -antiplatelet therapy: ASA 81 mg 3. Pain Management:tylenol prn  -OOB, ROM 4. Mood:LCSW to follow for evaluation and support. -antipsychotic agents: N/A 5. Neuropsych: This patientiscapable of making decisions on hisown behalf. 6. Skin/Wound Care:routine pressure relief measures. 7. Fluids/Electrolytes/Nutrition:Monitor I/O.  8. Dyslipidemia: On Repatha at home. 9.HTN: Monitor BID. On low dose Metoprolol bid.   Norvasc increased to 5 mg on 5/2 Vitals:   11/30/18 0615 11/30/18 0655  BP: (!) 130/57 132/60  Pulse: (!) 50 (!) 49  Resp: 17 18  Temp: 98 F (36.7 C) (!) 97.5 F (36.4 C)  SpO2: 97% 98%   Pt has elevated systolic increase amlodipine  to 10mg - controlled 5/18 10. Neurogenic bladder/Urinary retention:   Flomax started on 5/3,vodining well    11. Reactive depression: Now on Prozac.  12. T2DM: Monitor BS ac/hs. due to hypoglycemia, Home regimen of 70/30 insulin changed to:  35 units with breakfast   5 units with lunch - Will d/c this dose as pm hypoglycemia likely due to long acting effect from noon and short acting from 4pm combine  12 units with supper    CBG (last 3)  Recent Labs    11/29/18 1728 11/29/18 2116 11/30/18 0652  GLUCAP 84 185* 100*  am CBG still on low side reduce pm 70/30 to 10U, controlled  5/18      LOS: 21 days A FACE TO [place] E Kirsteins 11/30/2018, 7:51 AM

## 2018-12-01 ENCOUNTER — Inpatient Hospital Stay (HOSPITAL_COMMUNITY): Payer: Medicare Other

## 2018-12-01 ENCOUNTER — Inpatient Hospital Stay (HOSPITAL_COMMUNITY): Payer: Medicare Other | Admitting: Occupational Therapy

## 2018-12-01 LAB — GLUCOSE, CAPILLARY
Glucose-Capillary: 93 mg/dL (ref 70–99)
Glucose-Capillary: 84 mg/dL (ref 70–99)
Glucose-Capillary: 105 mg/dL — ABNORMAL HIGH (ref 70–99)
Glucose-Capillary: 123 mg/dL — ABNORMAL HIGH (ref 70–99)

## 2018-12-01 NOTE — Discharge Summary (Signed)
Physician Discharge Summary  Patient ID: Andre Wolfe MRN: [id number] DOB/AGE: 07-15-1935 84 y.o.  Admit date: 11/09/2018 Discharge date: 12/03/2018  Discharge Diagnoses:  Principal Problem:   Right pontine stroke [facility]) Active Problems:   Hypercholesterolemia   Essential hypertension   Poorly controlled type 2 diabetes mellitus with peripheral neuropathy (HCC)   PAF (paroxysmal atrial fibrillation) (HCC)   Diabetes mellitus type 2 in nonobese (HCC)   History of depression   Neurogenic bladder due to old stroke   Discharged Condition: stable  Significant Diagnostic Studies: N/A   Labs:  Basic Metabolic Panel: BMP Latest Ref Rng & Units 11/30/2018 11/23/2018 11/16/2018  Glucose 70 - 99 mg/dL 97 143(H) 84  BUN 8 - 23 mg/dL 20 19 19   Creatinine 0.61 - 1.24 mg/dL 1.20 1.09 1.13  Sodium 135 - 145 mmol/L 136 136 138  Potassium 3.5 - 5.1 mmol/L 4.1 4.3 3.9  Chloride 98 - 111 mmol/L 104 104 105  CO2 22 - 32 mmol/L 21(L) 22 25  Calcium 8.9 - 10.3 mg/dL 8.8(L) 9.3 9.1    CBC: CBC Latest Ref Rng & Units 11/30/2018 11/23/2018 11/16/2018  WBC 4.0 - 10.5 K/uL 6.0 8.0 7.2  Hemoglobin 13.0 - 17.0 g/dL 14.5 15.8 15.1  Hematocrit 39.0 - 52.0 % 42.1 46.2 44.8  Platelets 150 - 400 K/uL 208 230 231    CBG: Recent Labs  Lab 12/02/18 0556 12/02/18 1150 12/02/18 1629 12/02/18 2142 12/03/18 0636  GLUCAP 80 114* 101* 158* 108*    Brief HPI:   Andre Wolfe is an 84 year old male with history of HTN, CAD, T2DM with peripheral neuropathy and gait disorder, PAF, CVA 06/2018 with CIR stay.  He was admitted on 11/05/2018 with 1 day history of left facial droop with dysarthria and left facial weakness.  He refused to come in due to COVID-19 but symptoms did not resolve therefore was brought in as code stroke.  CTA head neck was negative for LVO and showed stable widespread intracranial atherosclerosis with chronic severe left-PCA stenosis.  MRI brain done revealing recurrent right pontine stroke and Dr.  Rigoberto Noel recommended adding low-dose aspirin to Eliquis for stroke that was felt to be due to small vessel disease.  Prozac was added for due to issues with reactive depression and anxiety.  Patient with resultant left facial weakness with mild dysarthria and left-sided weakness affecting mobility and ADLs.  CIR was recommended due to functional decline   Hospital Course: Andre Wolfe was admitted to rehab 11/09/2018 for inpatient therapies to consist of PT, ST and OT at least three hours five days a week. Past admission physiatrist, therapy team and rehab RN have worked together to provide customized collaborative inpatient rehab. He was maintained on Eliquis and low-dose aspirin during the stay.  He did have a unwitnessed fall therefore follow-up CT of head was done and this was negative for any acute changes.  Diabetes has been monitored with  AC/HS CBG checks.  He has had issues with hypoglycemic episodes therefore nighttime 70/30 was decreased to 10 units and at bedtime snacks added to prevent a.m. hypoglycemic episodes.    His blood pressures have been monitored on twice daily basis and Norvasc has been titrated to 5 mg a day with improvement in BP control. Heart rate controlled on low dose BB.  He has history of urinary retention/neurogenic bladder and PVR checks ordered with addition of Flomax on 5/3 with improvement in voiding function. He is continent of bowel but incontinent  of bladder. His intake has improved and mood is reasonably stable on Prozac.  He has been making steady progress but continues to requires min to mod assist.  Patient has elected on SNF for follow-up therapy and he was discharged to [facility] in [place] on 12/03/18.    Rehab course: During patient's stay in rehab weekly team conferences were held to monitor patient's progress, set goals and discuss barriers to discharge. At admission, patient required max assist with mobility and with basic self-care tasks.  He exhibited mild  dysarthria, mild impairments in reasoning and severe impairments in short-term recall.   He  has had improvement in activity tolerance, balance, postural control as well as ability to compensate for deficits. He has had improvement in functional use LUE  and LLE as well as improvement in awareness. He is able to bathe with min assist. He is able to complete upper body dressing with supervision and requires min assist for lower body dressing. He requires mod assist with squat pivot transfers and is able to ambulate 65 feet with rolling walker and min assist.  Disposition: Skilled nursing facility  Diet: Heart healthy/Carb Modified medium.  Special Instructions: 1.  Toilet patient every 4 hours while awake.  I/O cath as needed if unable to void or PVR > 350 cc.   2.  Follow-up with neurology in 3-4 weeks.  She still in the room levels review: There 3. Monitor BS ac/hs. Offer HS snack to prevent am hypoglycemic episodes.    Allergies as of 12/03/2018      Reactions   Crestor [rosuvastatin Calcium]    Lipitor [atorvastatin Calcium]    Muscle weakness and arm pain   Zetia [ezetimibe]    Zocor [simvastatin]       Medication List    STOP taking these medications   NovoLIN 70/30 FlexPen (70-30) 100 UNIT/ML PEN Generic drug:  Insulin Isophane & Regular Human   omeprazole 20 MG capsule Commonly known as:  PRILOSEC Replaced by:  pantoprazole 40 MG tablet     TAKE these medications   acetaminophen 325 MG tablet Commonly known as:  TYLENOL Take 1-2 tablets (325-650 mg total) by mouth every 4 (four) hours as needed for mild pain.   amLODipine 5 MG tablet Commonly known as:  NORVASC Take 1 tablet (5 mg total) by mouth daily. What changed:    medication strength  how much to take   apixaban 5 MG Tabs tablet Commonly known as:  ELIQUIS Take 1 tablet (5 mg total) by mouth 2 (two) times daily.   aspirin 81 MG EC tablet Take 1 tablet (81 mg total) by mouth daily.   Evolocumab 140 MG/ML  Soaj Commonly known as:  Repatha SureClick Inject 891 mg into the skin every 14 (fourteen) days.   feeding supplement (ENSURE ENLIVE) Liqd Take 237 mLs by mouth 2 (two) times daily between meals.   FLUoxetine 20 MG capsule Commonly known as:  PROZAC Take 1 capsule (20 mg total) by mouth daily.   gabapentin 600 MG tablet Commonly known as:  NEURONTIN Take 600 mg by mouth at bedtime.   insulin aspart protamine- aspart (70-30) 100 UNIT/ML injection Commonly known as:  NOVOLOG MIX 70/30 Inject 0.1 mLs (10 Units total) into the skin daily with supper.   insulin aspart protamine- aspart (70-30) 100 UNIT/ML injection Commonly known as:  NOVOLOG MIX 70/30 Inject 0.35 mLs (35 Units total) into the skin daily with breakfast.   lidocaine 2 % jelly Commonly  known as:  XYLOCAINE Apply topically as needed (Use with in and out catheter).   metoprolol tartrate 25 MG tablet Commonly known as:  LOPRESSOR Take 0.5 tablets (12.5 mg total) by mouth 2 (two) times daily.   pantoprazole 40 MG tablet Commonly known as:  PROTONIX Take 1 tablet (40 mg total) by mouth daily. Replaces:  omeprazole 20 MG capsule   polyethylene glycol 17 g packet Commonly known as:  MIRALAX / GLYCOLAX Take 17 g by mouth daily with supper.   tamsulosin 0.4 MG Caps capsule Commonly known as:  FLOMAX Take 1 capsule (0.4 mg total) by mouth daily after supper.      Follow-up Information    Kirsteins, Luanna Salk, MD Follow up.   Specialty:  Physical Medicine and Rehabilitation Why:  Office will call with 4-6 weeks follow up appointment Contact information: [place] [address] [phone]        [place] Follow up.   Why:  Call for follow  up appointment in 3-4 weeks Contact information: [address]     [address] [place] [address] [place]. Call.   Why:  for routine follow up.  Contact  information: [address] [place] [phone]          Signed: Bary Leriche 12/03/2018, 8:34 AM

## 2018-12-01 NOTE — Progress Notes (Signed)
**Note Andre-Identified via Obfuscation** Occupational Therapy Session Note  Patient Details  Name: DEMICO Wolfe MRN: [id number] Date of Birth: 08-Feb-1935  Today's Date: 12/01/2018 OT Individual Time: 8921-1941 OT Individual Time Calculation (min): 60 min    Short Term Goals: Week 3:  OT Short Term Goal 1 (Week 3): Pt will complete LB dressing, including footwear, with min assist OT Short Term Goal 2 (Week 3): Pt will complete toilet transfers with min assist  Skilled Therapeutic Interventions/Progress Updates:    Treatment session with focus on ADL retraining with sit > stand and transfers.  Pt received semi-reclined in bed agreeable to shower.  Completed bed mobility with use of bed rails and min assist.  Mod assist squat pivot transfer bed > w/c with tactile cues and facilitation for anterior weight shift.  Stand pivot transfer w/c <> shower seat in room shower with use of grab bars and min assist.  Pt able to complete sit > stand during bathing with min assist with use of grab bars, therapist washed buttocks while pt maintained standing.  Completed squat pivot transfer w/c <> BSC over toilet with min to Rt and mod to Lt.  Pt completed hygiene with lateral leans, requiring assistance in standing for thoroughness.  Dressing completed at sit > stand level with increased time to thread LLE, ultimately utilizing figure 4 position.  Pt required mod assist sit > stand at sink and demonstrated ability to pull pants majority of way over hips with RUE while standing with min assist for standing balance.  Pt required increased time and mod cues for sequencing and problem solving during UB dressing, due to poor orientation and sequencing this session.  Grooming completed in sitting with use of LUE at diminished level when opening containers.  Pt remained upright in w/c with seat belt alarm on and all needs in reach.  Therapy Documentation Precautions:  Precautions Precautions: Fall Restrictions Weight Bearing Restrictions: No General:   Vital  Signs:  Pain:   ADL:   Vision   Perception    Praxis   Exercises:   Other Treatments:     Therapy/Group: Individual Therapy  [name] 12/01/2018, 8:04 AM

## 2018-12-01 NOTE — Progress Notes (Signed)
Occupational Therapy Session Note  Patient Details  Name: Andre Wolfe MRN: [id number] Date of Birth: 11/18/1934  Today's Date: 12/01/2018 OT Individual Time: 1000-1055 OT Individual Time Calculation (min): 55 min    Short Term Goals: Week 3:  OT Short Term Goal 1 (Week 3): Pt will complete LB dressing, including footwear, with min assist OT Short Term Goal 2 (Week 3): Pt will complete toilet transfers with min assist  Skilled Therapeutic Interventions/Progress Updates:    OT intervention with focus on w/c mobility and LUE FMC/gross motor table tasks.  Pt propelled w/c to nursing station and transitioned to gym.  Pt initially engaged in task requiring removal of nuts on bolts using LUE to manipulate nuts (tota 8 bolts/nuts). Pt completed task with more than a reasonable amount of time and commented on how fatigued his arm and hand became.  Pt transitioned to holding large beach ball with BUE with emphasis on squeezing with LUE to activated muscle groups.  Pt also challenged with completing chest passes and overhead passes to therapist.  Pt propelled back to room and remained in w/c with all needs within reach and belt alarm activated.   Therapy Documentation Precautions:  Precautions Precautions: Fall Restrictions Weight Bearing Restrictions: No Pain:  Pt denies pain this morning   Therapy/Group: Individual Therapy  [name] 12/01/2018, 11:00 AM

## 2018-12-01 NOTE — Progress Notes (Signed)
Social Work Patient ID: Andre Wolfe, male   DOB: [birth year], 84 y.o.   MRN: [id number]  Spoke with [facility] at [facility] who has confirmed a bed for Thursday for pt. He is in agreement with this along with his daughter and wife. The family will transport to facility on Thursday. Will work on transfer Thursday.

## 2018-12-01 NOTE — Progress Notes (Signed)
This nurse attempted to administer the scheduled Miralax to the pt to aid with a BM. Pt refused the medication stating" I do not think I need that right now". Risk and benefits were explained to the pt about refusing the medication. Pt continued to refuse. LBM 11/29/18

## 2018-12-01 NOTE — Progress Notes (Signed)
[place] PHYSICAL MEDICINE & REHABILITATION PROGRESS NOTE   Subjective/Complaints:  No issues overnite, discussed CT head results   ROS: Patient denies  nausea, vomiting, diarrhea, cough, shortness of breath or chest pain, ,   Objective:   Ct Head Wo Contrast  Result Date: 11/30/2018 CLINICAL DATA:  Follow-up head trauma. EXAM: CT HEAD WITHOUT CONTRAST TECHNIQUE: Contiguous axial images were obtained from the base of the skull through the vertex without intravenous contrast. COMPARISON:  MRI is CT scan of November 05, 2018. FINDINGS: Brain: Mild diffuse cortical atrophy is noted. Mild chronic ischemic white matter disease is noted. No mass effect or midline shift is noted. Ventricular size is within normal limits. There is no evidence of mass lesion, hemorrhage or acute infarction. Vascular: No hyperdense vessel or unexpected calcification. Skull: Normal. Negative for fracture or focal lesion. Sinuses/Orbits: Left maxillary mucous retention cyst is noted. Other: None. IMPRESSION: Mild diffuse cortical atrophy. Mild chronic ischemic white matter disease. No acute intracranial abnormality seen. Electronically Signed   By: Marijo Conception M.D.   On: 11/30/2018 09:53   Recent Labs    11/30/18 0659  WBC 6.0  HGB 14.5  HCT 42.1  PLT 208   Recent Labs    11/30/18 0659  NA 136  K 4.1  CL 104  CO2 21*  GLUCOSE 97  BUN 20  CREATININE 1.20  CALCIUM 8.8*    Intake/Output Summary (Last 24 hours) at 12/01/2018 0703 Last data filed at 11/30/2018 1835 Gross per 24 hour  Intake 720 ml  Output -  Net 720 ml     Physical Exam: Vital Signs Blood pressure (!) 124/52, pulse (!) 57, temperature 98.6 F (37 C), temperature source Oral, resp. rate 18, height 6' (1.829 m), weight 92.3 kg, SpO2 95 %. Constitutional: No distress . Vital signs reviewed. HEENT: EOMI, oral membranes moist, AT, [place]: supple Cardiovascular: RRR without murmur. No JVD    Respiratory: CTA Bilaterally without  wheezes or rales. Normal effort    GI: BS +, non-tender, non-distended   Musc: mild LBP Neurologic: Alert Motor: RUE/RLE: 5/5 proximal distal LUE/LLE: 3+/5 proximal to distal Skin: Warm and dry.  Intact.  Assessment/Plan: 1. Functional deficits secondary to recurrent R pontine infarct which require 3+ hours per day of interdisciplinary therapy in a comprehensive inpatient rehab setting.  Physiatrist is providing close team supervision and 24 hour management of active medical problems listed below.  Physiatrist and rehab team continue to assess barriers to discharge/monitor patient progress toward functional and medical goals  Care Tool:  Bathing  Bathing activity did not occur: Refused Body parts bathed by patient: Left arm, Chest, Abdomen, Front perineal area, Right upper leg, Left upper leg, Face, Right lower leg, Left lower leg, Right arm   Body parts bathed by helper: Buttocks     Bathing assist Assist Level: Minimal Assistance - Patient > 75%     Upper Body Dressing/Undressing Upper body dressing   What is the patient wearing?: Pull over shirt    Upper body assist Assist Level: Supervision/Verbal cueing    Lower Body Dressing/Undressing Lower body dressing      What is the patient wearing?: Pants     Lower body assist Assist for lower body dressing: Minimal Assistance - Patient > 75%     Toileting Toileting    Toileting assist Assist for toileting: Minimal Assistance - Patient > 75%     Transfers Chair/bed transfer  Transfers assist  Chair/bed transfer activity did not occur:  Safety/medical concerns  Chair/bed transfer assist level: Moderate Assistance - Patient 50 - 74%     Locomotion Ambulation   Ambulation assist      Assist level: Moderate Assistance - Patient 50 - 74% Assistive device: Orthosis Max distance: 32   Walk 10 feet activity   Assist     Assist level: Moderate Assistance - Patient - 50 - 74% Assistive device:  Walker-rolling, Orthosis   Walk 50 feet activity   Assist Walk 50 feet with 2 turns activity did not occur: Safety/medical concerns  Assist level: Moderate Assistance - Patient - 50 - 74% Assistive device: Walker-rolling, Orthosis    Walk 150 feet activity   Assist Walk 150 feet activity did not occur: Safety/medical concerns         Walk 10 feet on uneven surface  activity   Assist Walk 10 feet on uneven surfaces activity did not occur: Safety/medical concerns         Wheelchair     Assist Will patient use wheelchair at discharge?: Yes Type of Wheelchair: Manual    Wheelchair assist level: Supervision/Verbal cueing Max wheelchair distance: 63'    Wheelchair 50 feet with 2 turns activity    Assist        Assist Level: Supervision/Verbal cueing   Wheelchair 150 feet activity     Assist Wheelchair 150 feet activity did not occur: Safety/medical concerns(Per notes)   Assist Level: Supervision/Verbal cueing    Medical Problem List and Plan: 1.Functional deficits and left hemiparesissecondary to right pontine infarct on 11/05/2018  Continue CIR,PT, OT, SLP Unwitnessed fall on Eliquis and ASA-  CT head shows no SDH or acute abnormalities 2. Antithrombotics: -DVT/anticoagulation:Pharmaceutical:Other (comment)--Eliquis. -antiplatelet therapy: ASA 81 mg 3. Pain Management:tylenol prn  -OOB, ROM 4. Mood:LCSW to follow for evaluation and support. -antipsychotic agents: N/A 5. Neuropsych: This patientiscapable of making decisions on hisown behalf. 6. Skin/Wound Care:routine pressure relief measures. 7. Fluids/Electrolytes/Nutrition:Monitor I/O.  8. Dyslipidemia: On Repatha at home. 9.HTN: Monitor BID. On low dose Metoprolol bid.   Norvasc increased to 5 mg on 5/2 Vitals:   11/30/18 2016 12/01/18 0353  BP: (!) 113/56 (!) 124/52  Pulse: (!) 52 (!) 57  Resp: 18 18  Temp: 97.6 F (36.4 C) 98.6 F (37 C)   SpO2: 97% 95%   Pt has elevated systolic increase amlodipine  to 10mg - controlled 5/19 10. Neurogenic bladder/Urinary retention:   Flomax started on 5/3,vodining well    11. Reactive depression: Now on Prozac.  12. T2DM: Monitor BS ac/hs. due to hypoglycemia, Home regimen of 70/30 insulin changed to:  35 units with breakfast     10 units with supper    CBG (last 3)  Recent Labs    11/30/18 1723 11/30/18 2114 12/01/18 0633  GLUCAP 97 153* 93  am CBG still on low side reduce pm 70/30 to 10U, controlled  5/19      LOS: 22 days A FACE TO [place] E Sharlena Kristensen 12/01/2018, 7:03 AM

## 2018-12-01 NOTE — Progress Notes (Signed)
Physical Therapy Session Note  Patient Details  Name: Andre Wolfe MRN: [id number] Date of Birth: 13-Nov-1934  Today's Date: 12/01/2018 PT Individual Time: 1303-1400 PT Individual Time Calculation (min): 57 min   Short Term Goals: Week 3:  PT Short Term Goal 1 (Week 3): = LTG  Skilled Therapeutic Interventions/Progress Updates: Pt presented in w/c agreeable to therapy. Pt propelled to rehab gym supervision with improved straight trajectory this session.  Performed stand pivot transfer with RW to mat modA. Pt participated in standing tolerance activities including several bouts for horseshoes with cues for increased L knee extension and correcting L lateral lean. Pt performed bouts with both R and L UE for forced use of LUE. Performed STS with RLE on 2in step for forced use of LLE. Pt initially required mulitmodal cues to correct posterior lean however pt was able to correct with repetitions. Pt performed stand pivot to w/c with minA and transported to stairs. Pt performed toe taps to 3in steps to fatigue L only/R only then alternating. Pt was able to correct L lateral lean during wt shifting with only intermittent cues. Pt propelled 123f supervision level then PTA transported remaining distance to across from door. Pt then ambulated from w/c to bed with modA due to poor clearance of LLE during step through. Pt returned to bed at end of session with CGA and increased time with use of bed features. Pt left in bed with call bell within reach, bed alarm on, and needs met.      Therapy Documentation Precautions:  Precautions Precautions: Fall Restrictions Weight Bearing Restrictions: No General:   Vital Signs: Therapy Vitals Temp: 98 F (36.7 C) Pulse Rate: (!) 53 Resp: 18 BP: (!) 119/58 Patient Position (if appropriate): Lying Oxygen Therapy SpO2: 100 % O2 Device: Room Air    Therapy/Group: Individual Therapy  Lakendrick Paradis  Sahib Pella, PTA  12/01/2018, 4:40 PM

## 2018-12-02 ENCOUNTER — Inpatient Hospital Stay (HOSPITAL_COMMUNITY): Payer: Medicare Other | Admitting: Occupational Therapy

## 2018-12-02 ENCOUNTER — Inpatient Hospital Stay (HOSPITAL_COMMUNITY): Payer: Medicare Other

## 2018-12-02 ENCOUNTER — Inpatient Hospital Stay (HOSPITAL_COMMUNITY): Payer: Medicare Other | Admitting: Physical Therapy

## 2018-12-02 LAB — GLUCOSE, CAPILLARY
Glucose-Capillary: 80 mg/dL (ref 70–99)
Glucose-Capillary: 114 mg/dL — ABNORMAL HIGH (ref 70–99)
Glucose-Capillary: 101 mg/dL — ABNORMAL HIGH (ref 70–99)
Glucose-Capillary: 158 mg/dL — ABNORMAL HIGH (ref 70–99)

## 2018-12-02 MED ORDER — TAMSULOSIN HCL 0.4 MG PO CAPS: 0.4000 mg | ORAL_CAPSULE | Freq: Every day | ORAL | Status: AC

## 2018-12-02 MED ORDER — METOPROLOL TARTRATE 25 MG PO TABS: 12.5000 mg | ORAL_TABLET | Freq: Two times a day (BID) | ORAL | Status: AC

## 2018-12-02 MED ORDER — PANTOPRAZOLE SODIUM 40 MG PO TBEC: 40.0000 mg | DELAYED_RELEASE_TABLET | Freq: Every day | ORAL | Status: AC

## 2018-12-02 MED ORDER — LIDOCAINE HCL URETHRAL/MUCOSAL 2 % EX GEL: CUTANEOUS | Status: AC | PRN

## 2018-12-02 MED ORDER — INSULIN ASPART PROT & ASPART (70-30 MIX) 100 UNIT/ML ~~LOC~~ SUSP: 35.0000 [IU] | Freq: Every day | SUBCUTANEOUS | 11 refills | Status: AC

## 2018-12-02 MED ORDER — POLYETHYLENE GLYCOL 3350 17 G PO PACK: 17.0000 g | PACK | Freq: Every day | ORAL | 0 refills | Status: AC

## 2018-12-02 MED ORDER — INSULIN ASPART PROT & ASPART (70-30 MIX) 100 UNIT/ML ~~LOC~~ SUSP: 10.0000 [IU] | Freq: Every day | SUBCUTANEOUS | 11 refills | Status: AC

## 2018-12-02 MED ORDER — AMLODIPINE BESYLATE 5 MG PO TABS: 5.0000 mg | ORAL_TABLET | Freq: Every day | ORAL | Status: AC

## 2018-12-02 NOTE — Progress Notes (Signed)
Social Work Patient ID: Andre Wolfe, male   DOB: [birth year], 84 y.o.   MRN: [id number] Spoke with daughter plan for tomorrow is to lave at 11:15 form here to take to NH. Will let staff know and be ready.

## 2018-12-02 NOTE — Patient Care Conference (Signed)
Inpatient RehabilitationTeam Conference and Plan of Care Update Date: 12/02/2018   Time: 10:45 AM    Patient Name: Andre Wolfe      Medical Record Number: [id number]  Date of Birth: 05/25/1935 Sex: Male         Room/Bed: 4M09C/4M09C-01 Payor Info: Payor: MEDICARE / Plan: MEDICARE PART A AND B / Product Type: *No Product type* /    Admitting Diagnosis: CVA 1 Team B CVA; 21-24days  Admit Date/Time:  11/09/2018  4:24 PM Admission Comments: No comment available   Primary Diagnosis:  Right pontine stroke [facility]) Principal Problem: Right pontine stroke [facility])  Patient Active Problem List   Diagnosis Date Noted  . History of depression   . AKI (acute kidney injury) ([place])   . Diabetes mellitus type 2 in nonobese (HCC)   . Hypoglycemia   . Neurogenic bladder   . Urinary retention   . Bradycardia   . Right pontine stroke ([place]) 07/16/2018  . Acute ischemic stroke ([place])   . Essential hypertension   . Poorly controlled type 2 diabetes mellitus with peripheral neuropathy ([place])   . Tobacco abuse   . PAF (paroxysmal atrial fibrillation) ([place])   . Hypercholesterolemia 05/04/2018    Expected Discharge Date: Expected Discharge Date: 12/03/18  Team Members Present: Physician leading conference: Dr. Alysia Penna Social Worker Present: Ovidio Kin, LCSW Nurse Present: Rayetta Humphrey, RN PT Present: Barrie Folk, PT;Rosita Dechalus, PTA OT Present: Darleen Crocker, OT SLP Present: Charolett Bumpers, SLP PPS Coordinator present : Gunnar Fusi     Current Status/Progress Goal Weekly Team Focus  Medical   Low CBG in a.m., blood pressure is controlled  Manage diabetes reduce fall risk  Discharge planning, address low a.m. CBGs   Bowel/Bladder   Incontinent of B/B, PVR Q6, LBM 11/29/18- Pt refusing miralax  Continent of B/B w.regular bowel pattern  Timed toileting, laxatives PRN, PVR PRN   Swallow/Nutrition/ Hydration             ADL's   setup UB dressing and grooming, min assist bathing,  min-mod assist LB dressing, min-mod assist stand pivot transfers - fluctuates due to decreased motor planning and problem solving  Min assist bathing, LB dressing, transfers, any task with dynamic standing balance, Supervison UB dressing, grooming, feeding  ADL retraining, dynamic standing balance, transfers, LUE NMR, d/c planning   Mobility   minA transfers, min to modA gait pending fatigue.   downgraded to min A transfers and gait  gait, L NMR, dynamic balance   Communication             Safety/Cognition/ Behavioral Observations            Pain   Pt c/o of no pain   Remain free of pain  Assess pain Qshift/ PRN   Skin   Skin is intact, no signs of breakdown  Skin will remain intact, free of breakdown.  Assess skin Qshift/PRN      *See Care Plan and progress notes for long and short-term goals.     Barriers to Discharge  Current Status/Progress Possible Resolutions Date Resolved   Physician    Medical stability        Plan discharge this week, at bedtime snack      Nursing                  PT                    OT  SLP                SW                Discharge Planning/Teaching Needs:  Wife and daughter came in and relaized can not provide the care pt will require, changed plan to NHP. Pt going to [facility] in [place] where he grew up on Thurs 5/21      Team Discussion:  Pt continues to make progress in his therapies slowly, but not at a level where he can go home with wife. He has a NH bed in [place] for tomorrow and daughter and wife will transport him back to his hometown NH=per pt's wishes  Revisions to Treatment Plan:  DC 5/21 to NH    Continued Need for Acute Rehabilitation Level of Care: The patient requires daily medical management by a physician with specialized training in physical medicine and rehabilitation for the following conditions: Daily direction of a multidisciplinary physical rehabilitation program to ensure safe treatment while eliciting  the highest outcome that is of practical value to the patient.: Yes Daily medical management of patient stability for increased activity during participation in an intensive rehabilitation regime.: Yes Daily analysis of laboratory values and/or radiology reports with any subsequent need for medication adjustment of medical intervention for : Neurological problems;Diabetes problems   I attest that I was present, lead the team conference, and concur with the assessment and plan of the team. Teleconference due to [place], [place] 12/02/2018, 10:05 AM

## 2018-12-02 NOTE — Progress Notes (Signed)
Occupational Therapy Session Note  Patient Details  Name: AZAVIER CRESON MRN: [id number] Date of Birth: [birth year]  Today's Date: 12/02/2018 OT Individual Time: 0623-7628 OT Individual Time Calculation (min): 45 min    Short Term Goals: Week 1:  OT Short Term Goal 1 (Week 1): Pt will complete bathing with mod assist at sit > stand level OT Short Term Goal 1 - Progress (Week 1): Met OT Short Term Goal 2 (Week 1): Pt will complete UB dressing with min assist OT Short Term Goal 2 - Progress (Week 1): Met OT Short Term Goal 3 (Week 1): Pt will complete LB dressing (except footwear) with mod assist at sit > stand level OT Short Term Goal 3 - Progress (Week 1): Met OT Short Term Goal 4 (Week 1): Pt will complete toilet transfer with mod assist of one caregiver OT Short Term Goal 4 - Progress (Week 1): Progressing toward goal Week 2:  OT Short Term Goal 1 (Week 2): Pt will complete bathing with min assist at sit > stand level in shower OT Short Term Goal 1 - Progress (Week 2): Met OT Short Term Goal 2 (Week 2): Pt will complete LB dressing, including footwear, with min assist OT Short Term Goal 2 - Progress (Week 2): Progressing toward goal OT Short Term Goal 3 (Week 2): Pt will complete toilet transfers with min assist OT Short Term Goal 3 - Progress (Week 2): Progressing toward goal OT Short Term Goal 4 (Week 2): Pt will complete toileting hygiene with mod assist OT Short Term Goal 4 - Progress (Week 2): Met Week 3:  OT Short Term Goal 1 (Week 3): Pt will complete LB dressing, including footwear, with min assist OT Short Term Goal 2 (Week 3): Pt will complete toilet transfers with min assist  Skilled Therapeutic Interventions/Progress Updates:    1:1 Pt in bed when arrived. Pt declined showering today. Pt came to EOB with min guard. At EOB participated in UB bathing and donning a clean shirt. Pt required min cuing for anterior weight shift with dynamic sitting balance. Pt performed min A scoot  pivot transfer into the w/c. Transitioned into the w/c and perform stand pivot w/c to 3:1 over the commode with min A with use of the grab bar. Pt stood with grab bars with min A and required mod A for toileting. Pt able to cleanse self with min to mod A for balance (staff checked behind him). Pt performed min A transfer back into w/c but required more facilitation for anterior weight shift. Performed grooming tasks including tooth brushing and shaving with w/c setup at the sink. Propelled with bilateral LES and Ues from room to the RN station with mod cues for attention to left LE and follow through of advancing LE with w/c mobility. Perform sit to stand at the washer to load washer with min A with cues for sustained hip and knee extension during standing.   Therapy Documentation Precautions:  Precautions Precautions: Fall Restrictions Weight Bearing Restrictions: No Pain:  no c/o pain in session  ADL: ADL Grooming: Modified independent, Setup Where Assessed-Grooming: Sitting at sink Upper Body Bathing: Setup, Supervision/safety Where Assessed-Upper Body Bathing: Sitting at sink, Edge of bed Lower Body Bathing: Minimal assistance Where Assessed-Lower Body Bathing: Edge of bed Upper Body Dressing: Supervision/safety, Setup Where Assessed-Upper Body Dressing: Edge of bed Lower Body Dressing: Moderate assistance Where Assessed-Lower Body Dressing: Edge of bed Toileting: Moderate assistance Toilet Transfer: Minimal assistance Toilet Transfer Method: Stand pivot Armed forces technical officer  Equipment: Bedside commode, Grab bars Vision Baseline Vision/History: Wears glasses Wears Glasses: Reading only Patient Visual Report: No change from baseline Vision Assessment?: Yes Eye Alignment: Within Functional Limits Ocular Range of Motion: Within Functional Limits Alignment/Gaze Preference: Within Defined Limits Tracking/Visual Pursuits: Able to track stimulus in all quads without difficulty Saccades:  Within functional limits Convergence: Within functional limits Visual Fields: No apparent deficits Perception  Perception: Within Functional Limits Praxis Praxis: Intact Exercises:     Therapy/Group: Individual Therapy  Willeen Cass [facility] 12/02/2018, 2:39 PM

## 2018-12-02 NOTE — Progress Notes (Signed)
Occupational Therapy Session Note  Patient Details  Name: Andre Wolfe MRN: [id number] Date of Birth: Jul 13, 1935  Today's Date: 12/02/2018 OT Individual Time: 1000-1055 OT Individual Time Calculation (min): 55 min    Short Term Goals: Week 3:  OT Short Term Goal 1 (Week 3): Pt will complete LB dressing, including footwear, with min assist OT Short Term Goal 2 (Week 3): Pt will complete toilet transfers with min assist  Skilled Therapeutic Interventions/Progress Updates:     Pt resting in w/c upon arrival.  OT intervention with focus on w/c mobility, LUE gross motor and [facility] tasks, education, and safety awareness.  Pt discharging to SNF tomorrow.  Pt completed bathing/dressing tasks earlier in the day.  Pt initially engaged in 9 hole peg test: R-37.42, L-2:46.85.  Pt commented that his hand/arm fatigued. Pt also engaged in tossing/catching large beach ball while seated supported.  Pt with improved ability to catch ball in hands vs trapping against chest.  Pt able to correctly execute chest pass with equal effort in LUE/RUE. Pt with difficulty in overhead pass 2/2 LUE shoulder weakness and limited AROM. Pt engaged in BUE therex with BoomWhacker with emphasis on maintaining appropriate pressure agains whacker with LUE.  Pt propelled back to room and remained in w/c with all needs within reach and belt alarm activated.   Therapy Documentation Precautions:  Precautions Precautions: Fall Restrictions Weight Bearing Restrictions: No  Pain:  Pt denies pain this morning   Therapy/Group: Individual Therapy  [name] 12/02/2018, 11:26 AM

## 2018-12-02 NOTE — Progress Notes (Signed)
[place] PHYSICAL MEDICINE & REHABILITATION PROGRESS NOTE   Subjective/Complaints:  No issues overnite, discussed CBG   ROS: Patient denies  nausea, vomiting, diarrhea, cough, shortness of breath or chest pain, ,   Objective:   Ct Head Wo Contrast  Result Date: 11/30/2018 CLINICAL DATA:  Follow-up head trauma. EXAM: CT HEAD WITHOUT CONTRAST TECHNIQUE: Contiguous axial images were obtained from the base of the skull through the vertex without intravenous contrast. COMPARISON:  MRI is CT scan of November 05, 2018. FINDINGS: Brain: Mild diffuse cortical atrophy is noted. Mild chronic ischemic white matter disease is noted. No mass effect or midline shift is noted. Ventricular size is within normal limits. There is no evidence of mass lesion, hemorrhage or acute infarction. Vascular: No hyperdense vessel or unexpected calcification. Skull: Normal. Negative for fracture or focal lesion. Sinuses/Orbits: Left maxillary mucous retention cyst is noted. Other: None. IMPRESSION: Mild diffuse cortical atrophy. Mild chronic ischemic white matter disease. No acute intracranial abnormality seen. Electronically Signed   By: Marijo Conception M.D.   On: 11/30/2018 09:53   Recent Labs    11/30/18 0659  WBC 6.0  HGB 14.5  HCT 42.1  PLT 208   Recent Labs    11/30/18 0659  NA 136  K 4.1  CL 104  CO2 21*  GLUCOSE 97  BUN 20  CREATININE 1.20  CALCIUM 8.8*    Intake/Output Summary (Last 24 hours) at 12/02/2018 0759 Last data filed at 12/02/2018 0736 Gross per 24 hour  Intake 1160 ml  Output -  Net 1160 ml     Physical Exam: Vital Signs Blood pressure 132/70, pulse (!) 55, temperature 98 F (36.7 C), temperature source Oral, resp. rate 18, height 6' (1.829 m), weight 92.3 kg, SpO2 98 %. Constitutional: No distress . Vital signs reviewed. HEENT: EOMI, oral membranes moist, AT, [place]ck: supple Cardiovascular: RRR without murmur. No JVD    Respiratory: CTA Bilaterally without wheezes or rales.  Normal effort    GI: BS +, non-tender, non-distended   Musc: mild LBP Neurologic: Alert Motor: RUE/RLE: 5/5 proximal distal LUE/LLE: 3+/5 proximal to distal Skin: Warm and dry.  Intact.  Assessment/Plan: 1. Functional deficits secondary to recurrent R pontine infarct which require 3+ hours per day of interdisciplinary therapy in a comprehensive inpatient rehab setting.  Physiatrist is providing close team supervision and 24 hour management of active medical problems listed below.  Physiatrist and rehab team continue to assess barriers to discharge/monitor patient progress toward functional and medical goals  Care Tool:  Bathing  Bathing activity did not occur: Refused Body parts bathed by patient: Left arm, Chest, Abdomen, Front perineal area, Right upper leg, Left upper leg, Face, Right lower leg, Left lower leg, Right arm   Body parts bathed by helper: Buttocks     Bathing assist Assist Level: Minimal Assistance - Patient > 75%     Upper Body Dressing/Undressing Upper body dressing   What is the patient wearing?: Pull over shirt    Upper body assist Assist Level: Supervision/Verbal cueing    Lower Body Dressing/Undressing Lower body dressing      What is the patient wearing?: Pants     Lower body assist Assist for lower body dressing: Minimal Assistance - Patient > 75%     Toileting Toileting    Toileting assist Assist for toileting: Minimal Assistance - Patient > 75%     Transfers Chair/bed transfer  Transfers assist  Chair/bed transfer activity did not occur: Safety/medical concerns  Chair/bed transfer assist level: Moderate Assistance - Patient 50 - 74%     Locomotion Ambulation   Ambulation assist      Assist level: Moderate Assistance - Patient 50 - 74% Assistive device: Orthosis Max distance: 32   Walk 10 feet activity   Assist     Assist level: Moderate Assistance - Patient - 50 - 74% Assistive device: Walker-rolling, Orthosis    Walk 50 feet activity   Assist Walk 50 feet with 2 turns activity did not occur: Safety/medical concerns  Assist level: Moderate Assistance - Patient - 50 - 74% Assistive device: Walker-rolling, Orthosis    Walk 150 feet activity   Assist Walk 150 feet activity did not occur: Safety/medical concerns         Walk 10 feet on uneven surface  activity   Assist Walk 10 feet on uneven surfaces activity did not occur: Safety/medical concerns         Wheelchair     Assist Will patient use wheelchair at discharge?: Yes Type of Wheelchair: Manual    Wheelchair assist level: Supervision/Verbal cueing Max wheelchair distance: 23'    Wheelchair 50 feet with 2 turns activity    Assist        Assist Level: Supervision/Verbal cueing   Wheelchair 150 feet activity     Assist Wheelchair 150 feet activity did not occur: Safety/medical concerns(Per notes)   Assist Level: Supervision/Verbal cueing    Medical Problem List and Plan: 1.Functional deficits and left hemiparesissecondary to right pontine infarct on 11/05/2018  Continue CIR,PT, OT, SLP Unwitnessed fall on Eliquis and ASA-  CT head shows no SDH or acute abnormalities 2. Antithrombotics: -DVT/anticoagulation:Pharmaceutical:Other (comment)--Eliquis. -antiplatelet therapy: ASA 81 mg 3. Pain Management:tylenol prn  -OOB, ROM 4. Mood:LCSW to follow for evaluation and support. -antipsychotic agents: N/A 5. Neuropsych: This patientiscapable of making decisions on hisown behalf. 6. Skin/Wound Care:routine pressure relief measures. 7. Fluids/Electrolytes/Nutrition:Monitor I/O.  8. Dyslipidemia: On Repatha at home. 9.HTN: Monitor BID. On low dose Metoprolol bid.   Norvasc increased to 5 mg on 5/2 Vitals:   12/01/18 1931 12/02/18 0346  BP: 119/61 132/70  Pulse: 64 (!) 55  Resp: 18 18  Temp: 98.6 F (37 C) 98 F (36.7 C)  SpO2: 97% 98%   Pt has elevated  systolic increase amlodipine  to 10mg - controlled 5/20 10. Neurogenic bladder/Urinary retention:   Flomax started on 5/3,vodining well    11. Reactive depression: Now on Prozac.  12. T2DM: Monitor BS ac/hs. due to hypoglycemia, Home regimen of 70/30 insulin changed to:  35 units with breakfast     10 units with supper    CBG (last 3)  Recent Labs    12/01/18 1633 12/01/18 2101 12/02/18 0556  GLUCAP 105* 123* 80  am CBG still on low side reduce pm 70/30 to 10U, controlled  5/20, discussed with pt and nsg, order bedtime snack      LOS: 23 days A FACE TO [place] E Kirsteins 12/02/2018, 7:59 AM

## 2018-12-02 NOTE — Progress Notes (Signed)
Physical Therapy Discharge Summary  Patient Details  Name: Andre Wolfe MRN: [id number] Date of Birth: [birth year]  Today's Date: 12/02/2018   Patient has met 9 of 9 long term goals due to improved activity tolerance, improved balance, improved postural control, increased strength, improved attention, improved awareness and improved coordination.  Patient to discharge at an ambulatory level [place].   Patient's care partner is independent to provide the necessary physical assistance at discharge. Pt to be d/c to SNF for additional rehab.   Reasons goals not met: all goals met  Recommendation:  Patient will benefit from ongoing skilled PT services in skilled nursing facility setting to continue to advance safe functional mobility, address ongoing impairments in balance, transfers, gait, safety, strength, coordination, and minimize fall risk.  Equipment: No equipment provided  Reasons for discharge: treatment goals met  Patient/family agrees with progress made and goals achieved: Yes  PT Discharge Precautions/Restrictions Restrictions Weight Bearing Restrictions: No Vital Signs Therapy Vitals Temp: 97.9 F (36.6 C) Pulse Rate: (!) 57 Resp: 18 BP: 135/88 Patient Position (if appropriate): Lying Oxygen Therapy SpO2: 100 % O2 Device: Room Air Pain Pain Assessment Pain Scale: 0-10 Pain Score: 0-No pain Vision/Perception    WFL Cognition Overall Cognitive Status: Within Functional Limits for tasks assessed Arousal/Alertness: Awake/alert Orientation Level: Oriented X4 Attention: Selective Focused Attention: Appears intact Selective Attention: Impaired Memory: Impaired Memory Impairment: Storage deficit;Decreased recall of new information;Decreased short term memory Awareness: Appears intact Problem Solving: Impaired Problem Solving Impairment: Functional basic Safety/Judgment: Appears intact Sensation Sensation Light Touch: Appears Intact Hot/Cold: Appears  Intact Proprioception: Impaired Detail Proprioception Impaired Details: Impaired LLE;Impaired LUE Coordination Gross Motor Movements are Fluid and Coordinated: No Fine Motor Movements are Fluid and Coordinated: No Coordination and Movement Description: Lt hemiplegia Motor  Motor Motor: Abnormal tone;Hemiplegia;Abnormal postural alignment and control Motor - Skilled Clinical Observations: Lt hemiplegia  Mobility Bed Mobility Bed Mobility: Sit to Supine;Supine to Sit Supine to Sit: Supervision/Verbal cueing Sit to Supine: Supervision/Verbal cueing Transfers Transfers: Stand Pivot Transfers Sit to Stand: Contact Guard/Touching assist Stand Pivot Transfers: Minimal Assistance - Patient > 75% Stand Pivot Transfer Details: Verbal cues for sequencing;Verbal cues for precautions/safety Squat Pivot Transfers: Minimal Assistance - Patient > 75% Locomotion  Gait Ambulation: Yes Gait Assistance: Minimal Assistance - Patient > 75% Gait Distance (Feet): 65 Feet Assistive device: Rolling walker Gait Gait: Yes Gait Pattern: Impaired Gait Pattern: Decreased hip/knee flexion - left;Left flexed knee in stance;Decreased weight shift to left;Decreased dorsiflexion - left;Poor foot clearance - left Gait velocity: decreased Stairs / Additional Locomotion Stairs: No  Trunk/Postural Assessment  Cervical Assessment Cervical Assessment: Exceptions to WFL(fwd head) Thoracic Assessment Thoracic Assessment: Exceptions to WFL(rounded shoulders) Lumbar Assessment Lumbar Assessment: Exceptions to WFL(posterior pelvic tilt) Postural Control Righting Reactions: delayed  Balance Balance Balance Assessed: Yes Static Sitting Balance Static Sitting - Balance Support: Feet unsupported Static Sitting - Level of Assistance: 5: Stand by assistance Dynamic Sitting Balance Dynamic Sitting - Balance Support: No upper extremity supported;Feet supported Dynamic Sitting - Balance Activities: Reaching for weighted  objects;Reaching across midline Static Standing Balance Static Standing - Comment/# of Minutes: SBA Dynamic Standing Balance Dynamic Standing - Comments: minA  Extremity Assessment      RLE Assessment RLE Assessment: Within Functional Limits General Strength Comments: grossly 4/5 LLE Assessment LLE Assessment: Exceptions to [facility] General Strength Comments: knee flex/ext 3+/5, hip flexion4-/5, hip ext 4-/5    Rosita DeChalus 12/02/2018, 4:15 PM

## 2018-12-02 NOTE — Progress Notes (Signed)
PM snack: Phillip Heal crackers and peanut butter; given to the pt per request of Dr. Letta Pate.

## 2018-12-02 NOTE — Progress Notes (Signed)
Occupational Therapy Discharge Summary  Patient Details  Name: Andre Wolfe MRN: [id number] Date of Birth: March 31, 1935  Patient has met 12 of 12 long term goals due to improved activity tolerance, improved balance, postural control, ability to compensate for deficits, functional use of  LEFT upper and LEFT lower extremity, improved awareness and improved coordination.  Patient to discharge at [facility] Assist level.  Patient's care partner unable to to provide the necessary physical assistance at discharge.  Pt continues to require facilitation and A for standing balance and transfers due to posterior lean during dynamic activity.   Reasons goals not met: n/a  Recommendation:  Patient will benefit from ongoing skilled OT services in skilled nursing facility setting to continue to advance functional skills in the area of BADL and Reduce care partner burden.  Equipment: No equipment provided  Reasons for discharge: treatment goals met and discharge from hospital  Patient/family agrees with progress made and goals achieved: Yes  OT Discharge ADL: ADL Grooming: Modified independent, Setup Where Assessed-Grooming: Sitting at sink Upper Body Bathing: Setup, Supervision/safety Where Assessed-Upper Body Bathing: Sitting at sink, Edge of bed Lower Body Bathing: Minimal assistance Where Assessed-Lower Body Bathing: Edge of bed Upper Body Dressing: Supervision/safety, Setup Where Assessed-Upper Body Dressing: Edge of bed Lower Body Dressing: Moderate assistance Where Assessed-Lower Body Dressing: Edge of bed Toileting: Moderate assistance Toilet Transfer: Minimal assistance Toilet Transfer Method: Stand pivot Toilet Transfer Equipment: Bedside commode, Grab bars   Vision Baseline Vision/History: Wears glasses Wears Glasses: Reading only Patient Visual Report: No change from baseline Vision Assessment?: Yes Eye Alignment: Within Functional Limits Ocular Range of Motion: Within  Functional Limits Alignment/Gaze Preference: Within Defined Limits Tracking/Visual Pursuits: Able to track stimulus in all quads without difficulty Saccades: Within functional limits Convergence: Within functional limits Visual Fields: No apparent deficits Perception  Perception: Within Functional Limits Praxis Praxis: Intact Cognition Overall Cognitive Status: Within Functional Limits for tasks assessed Arousal/Alertness: Awake/alert Orientation Level: Oriented X4 Attention: Selective Focused Attention: Appears intact Selective Attention: Impaired Memory: Impaired Memory Impairment: Storage deficit;Decreased recall of new information;Decreased short term memory Awareness: Appears intact Problem Solving: Impaired Problem Solving Impairment: Functional basic Safety/Judgment: Appears intact Sensation Sensation Light Touch: Appears Intact Hot/Cold: Appears Intact Proprioception: Impaired Detail Proprioception Impaired Details: Impaired LLE;Impaired LUE Coordination Gross Motor Movements are Fluid and Coordinated: No Fine Motor Movements are Fluid and Coordinated: No Coordination and Movement Description: Lt hemiplegia Finger Nose Finger Test: LUE delayed and uncoordinated 9 Hole Peg Test: R-37.42 L-2:46.85 Motor  Motor Motor: Abnormal tone;Hemiplegia;Abnormal postural alignment and control Motor - Skilled Clinical Observations: Lt hemiplegia    Trunk/Postural Assessment  Cervical Assessment Cervical Assessment: Exceptions to WFL(fwd head) Thoracic Assessment Thoracic Assessment: Exceptions to WFL(rounded shoulders) Lumbar Assessment Lumbar Assessment: Exceptions to WFL(posterior pelvic tilt) Postural Control Righting Reactions: delayed  Balance Static Sitting Balance Static Sitting - Balance Support: Feet supported Static Sitting - Level of Assistance: 5: Stand by assistance Dynamic Sitting Balance Dynamic Sitting - Balance Support: During functional  activity Dynamic Sitting - Level of Assistance: 5: Stand by assistance Extremity/Trunk Assessment RUE Assessment RUE Assessment: Within Functional Limits LUE Assessment LUE Assessment: Exceptions to [facility] Passive Range of Motion (PROM) Comments: shoulder flexion limited to 140* by tightness at end range Active Range of Motion (AROM) Comments: 110 shoulder flexion against gravity; elbow and wrist WNL General Strength Comments: 3+/5 shoulder, elbow and grasp WNL LUE Body System: Neuro Brunstrum levels for arm and hand: Arm;Hand Brunstrum level for arm: Stage V Relative Independence  from Synergy Brunstrum level for hand: Stage VI Isolated joint movements   Andre Wolfe 12/02/2018, 11:02 AM

## 2018-12-02 NOTE — Progress Notes (Signed)
Physical Therapy Session Note  Patient Details  Name: Andre Wolfe MRN: [id number] Date of Birth: 04-20-1935  Today's Date: 12/02/2018 PT Individual Time: 1430-1530 PT Individual Time Calculation (min): 60 min   Short Term Goals: Week 3:  PT Short Term Goal 1 (Week 3): = LTG  Skilled Therapeutic Interventions/Progress Updates: Pt presented in bed agreeable to therapy. Performed supine to sit with CGA with increased time with use of bed features. Pt performed squat pivot transfer to w/c modA. Pt transported to ortho gym and performed car transfer with minA and use of RW. Demonstrated ambulation 22f with RW with minA and w/c follow for safety. Pt noted to demonstrate decreased L lateral lean with gait this session and good wt shifting. Pt with decreased L foot clearance with fatigue. Participated in seated and standing dynamic balance by playing several bouts of horseshoes. In sitting forced use of LUE by reaching across midline and tossing bean bag. Pt performed in standing using RUE crossing midline and x 4 tosses with LUE with no LOB. Performed alternating toe taps x 10 bilaterally to 4in step with pt clearing step upon return with LLE and no LOB. Pt performed ambulatory transfers to/from w/c throughout session with minA and x 2 occurences of LOB requiring tactile cues for correction. Performed w/c mobility x 203ftowards room at end of session supervision level via hemi technique.  Pt transferred back to bed minA with RW and returned to supine CGA with features. Pt left in bed at end of session with bed alarm on, call bell within reach and needs met.      Therapy Documentation Precautions:  Precautions Precautions: Fall Restrictions Weight Bearing Restrictions: No General:   Vital Signs: Therapy Vitals Temp: 97.9 F (36.6 C) Pulse Rate: (!) 57 Resp: 18 BP: 135/88 Patient Position (if appropriate): Lying Oxygen Therapy SpO2: 100 % O2 Device: Room Air Pain: Pain Assessment Pain  Scale: 0-10 Pain Score: 0-No pain Mobility: Bed Mobility Bed Mobility: Supine to Sit Transfers Transfers: Stand Pivot Transfers Sit to Stand: Contact Guard/Touching assist Stand Pivot Transfers: Minimal Assistance - Patient > 75% Stand Pivot Transfer Details: Verbal cues for sequencing;Verbal cues for precautions/safety Squat Pivot Transfers: Minimal Assistance - Patient > 75% Locomotion : Gait Ambulation: Yes Gait Assistance: Minimal Assistance - Patient > 75% Gait Distance (Feet): 65 Feet Assistive device: Rolling walker Gait Gait: Yes Gait Pattern: Impaired Gait Pattern: Decreased hip/knee flexion - left;Left flexed knee in stance;Decreased weight shift to left;Decreased dorsiflexion - left;Poor foot clearance - left Gait velocity: decreased Stairs / Additional Locomotion Stairs: No  Trunk/Postural Assessment : Cervical Assessment Cervical Assessment: Exceptions to WFL(fwd head) Thoracic Assessment Thoracic Assessment: Exceptions to WFL(rounded shoulders) Lumbar Assessment Lumbar Assessment: Exceptions to WFL(posterior pelvic tilt) Postural Control Righting Reactions: delayed  Balance: Balance Balance Assessed: Yes Static Sitting Balance Static Sitting - Balance Support: Feet unsupported Static Sitting - Level of Assistance: 5: Stand by assistance Dynamic Sitting Balance Dynamic Sitting - Balance Support: No upper extremity supported;Feet supported Dynamic Sitting - Balance Activities: Reaching for weighted objects;Reaching across midline Static Standing Balance Static Standing - Comment/# of Minutes: SBA Dynamic Standing Balance Dynamic Standing - Comments: minA  Exercises:   Other Treatments:      Therapy/Group: Individual Therapy  Andre Wolfe  Andre Wolfe, PTA  12/02/2018, 4:00 PM

## 2018-12-03 ENCOUNTER — Encounter (HOSPITAL_COMMUNITY): Payer: Self-pay | Admitting: Physical Medicine and Rehabilitation

## 2018-12-03 DIAGNOSIS — I69998 Other sequelae following unspecified cerebrovascular disease: Secondary | ICD-10-CM

## 2018-12-03 DIAGNOSIS — L89151 Pressure ulcer of sacral region, stage 1: Secondary | ICD-10-CM | POA: Diagnosis not present

## 2018-12-03 DIAGNOSIS — I480 Paroxysmal atrial fibrillation: Secondary | ICD-10-CM | POA: Diagnosis not present

## 2018-12-03 DIAGNOSIS — N401 Enlarged prostate with lower urinary tract symptoms: Secondary | ICD-10-CM | POA: Diagnosis not present

## 2018-12-03 DIAGNOSIS — I6350 Cerebral infarction due to unspecified occlusion or stenosis of unspecified cerebral artery: Secondary | ICD-10-CM | POA: Diagnosis not present

## 2018-12-03 DIAGNOSIS — I63219 Cerebral infarction due to unspecified occlusion or stenosis of unspecified vertebral arteries: Secondary | ICD-10-CM | POA: Diagnosis not present

## 2018-12-03 DIAGNOSIS — I4891 Unspecified atrial fibrillation: Secondary | ICD-10-CM | POA: Diagnosis not present

## 2018-12-03 DIAGNOSIS — G9009 Other idiopathic peripheral autonomic neuropathy: Secondary | ICD-10-CM | POA: Diagnosis not present

## 2018-12-03 DIAGNOSIS — E0840 Diabetes mellitus due to underlying condition with diabetic neuropathy, unspecified: Secondary | ICD-10-CM | POA: Diagnosis not present

## 2018-12-03 DIAGNOSIS — I2510 Atherosclerotic heart disease of native coronary artery without angina pectoris: Secondary | ICD-10-CM | POA: Diagnosis not present

## 2018-12-03 DIAGNOSIS — L309 Dermatitis, unspecified: Secondary | ICD-10-CM | POA: Diagnosis not present

## 2018-12-03 DIAGNOSIS — K5900 Constipation, unspecified: Secondary | ICD-10-CM | POA: Diagnosis not present

## 2018-12-03 DIAGNOSIS — G8194 Hemiplegia, unspecified affecting left nondominant side: Secondary | ICD-10-CM | POA: Diagnosis not present

## 2018-12-03 DIAGNOSIS — R278 Other lack of coordination: Secondary | ICD-10-CM | POA: Diagnosis not present

## 2018-12-03 DIAGNOSIS — F339 Major depressive disorder, recurrent, unspecified: Secondary | ICD-10-CM | POA: Diagnosis not present

## 2018-12-03 DIAGNOSIS — N319 Neuromuscular dysfunction of bladder, unspecified: Secondary | ICD-10-CM | POA: Diagnosis not present

## 2018-12-03 DIAGNOSIS — E785 Hyperlipidemia, unspecified: Secondary | ICD-10-CM | POA: Diagnosis not present

## 2018-12-03 DIAGNOSIS — K219 Gastro-esophageal reflux disease without esophagitis: Secondary | ICD-10-CM | POA: Diagnosis not present

## 2018-12-03 DIAGNOSIS — M6281 Muscle weakness (generalized): Secondary | ICD-10-CM | POA: Diagnosis not present

## 2018-12-03 DIAGNOSIS — R262 Difficulty in walking, not elsewhere classified: Secondary | ICD-10-CM | POA: Diagnosis not present

## 2018-12-03 DIAGNOSIS — R531 Weakness: Secondary | ICD-10-CM | POA: Diagnosis not present

## 2018-12-03 DIAGNOSIS — I69354 Hemiplegia and hemiparesis following cerebral infarction affecting left non-dominant side: Secondary | ICD-10-CM | POA: Diagnosis not present

## 2018-12-03 DIAGNOSIS — I10 Essential (primary) hypertension: Secondary | ICD-10-CM | POA: Diagnosis not present

## 2018-12-03 DIAGNOSIS — E119 Type 2 diabetes mellitus without complications: Secondary | ICD-10-CM | POA: Diagnosis not present

## 2018-12-03 LAB — GLUCOSE, CAPILLARY: Glucose-Capillary: 108 mg/dL — ABNORMAL HIGH (ref 70–99)

## 2018-12-03 MED ORDER — GABAPENTIN 600 MG PO TABS: 600.0000 mg | ORAL_TABLET | Freq: Every day | ORAL | 0 refills | Status: DC

## 2018-12-03 MED ORDER — GABAPENTIN 600 MG PO TABS: 600.0000 mg | ORAL_TABLET | Freq: Every day | ORAL | 0 refills | Status: AC

## 2018-12-03 NOTE — Progress Notes (Signed)
[place] PHYSICAL MEDICINE & REHABILITATION PROGRESS NOTE   Subjective/Complaints:  Pt aware of SNF transfer today   ROS: Patient denies  nausea, vomiting, diarrhea, cough, shortness of breath or chest pain, ,   Objective:   No results found. No results for input(s): WBC, HGB, HCT, PLT in the last 72 hours. No results for input(s): NA, K, CL, CO2, GLUCOSE, BUN, CREATININE, CALCIUM in the last 72 hours.  Intake/Output Summary (Last 24 hours) at 12/03/2018 0749 Last data filed at 12/03/2018 0735 Gross per 24 hour  Intake 340 ml  Output -  Net 340 ml     Physical Exam: Vital Signs Blood pressure (!) 153/62, pulse (!) 52, temperature (!) 97.4 F (36.3 C), temperature source Oral, resp. rate 16, height 6' (1.829 m), weight 92.3 kg, SpO2 100 %. Constitutional: No distress . Vital signs reviewed. HEENT: EOMI, oral membranes moist, AT, [place] Neck: supple Cardiovascular: RRR without murmur. No JVD    Respiratory: CTA Bilaterally without wheezes or rales. Normal effort    GI: BS +, non-tender, non-distended   Musc: no pain with UE o LE ROM, -SLR Neurologic: Alert Motor: RUE/RLE: 5/5 proximal distal LUE/LLE: 3+/5 proximal to distal Skin: Warm and dry.  Intact.  Assessment/Plan: 1. Functional deficits secondary to recurrent R pontine infarct Stable for D/C today F/u PCP in 3-4 weeks F/u PM&R 2 weeks See D/C summary See D/C instructions Care Tool:  Bathing  Bathing activity did not occur: Refused Body parts bathed by patient: Left arm, Chest, Abdomen, Front perineal area, Right upper leg, Left upper leg, Face, Right lower leg, Left lower leg, Right arm   Body parts bathed by helper: Buttocks     Bathing assist Assist Level: Minimal Assistance - Patient > 75%     Upper Body Dressing/Undressing Upper body dressing   What is the patient wearing?: Pull over shirt    Upper body assist Assist Level: Supervision/Verbal cueing    Lower Body Dressing/Undressing Lower body  dressing      What is the patient wearing?: Pants     Lower body assist Assist for lower body dressing: Minimal Assistance - Patient > 75%     Toileting Toileting    Toileting assist Assist for toileting: Minimal Assistance - Patient > 75%     Transfers Chair/bed transfer  Transfers assist  Chair/bed transfer activity did not occur: Safety/medical concerns  Chair/bed transfer assist level: Minimal Assistance - Patient > 75%     Locomotion Ambulation   Ambulation assist      Assist level: Minimal Assistance - Patient > 75% Assistive device: Walker-rolling Max distance: 65   Walk 10 feet activity   Assist     Assist level: Minimal Assistance - Patient > 75% Assistive device: Walker-rolling, Orthosis   Walk 50 feet activity   Assist Walk 50 feet with 2 turns activity did not occur: Safety/medical concerns  Assist level: Minimal Assistance - Patient > 75% Assistive device: Walker-rolling, Orthosis    Walk 150 feet activity   Assist Walk 150 feet activity did not occur: Safety/medical concerns         Walk 10 feet on uneven surface  activity   Assist Walk 10 feet on uneven surfaces activity did not occur: Safety/medical concerns         Wheelchair     Assist Will patient use wheelchair at discharge?: Yes Type of Wheelchair: Manual    Wheelchair assist level: Supervision/Verbal cueing Max wheelchair distance: 217ft    Wheelchair 50  feet with 2 turns activity    Assist        Assist Level: Supervision/Verbal cueing   Wheelchair 150 feet activity     Assist Wheelchair 150 feet activity did not occur: Safety/medical concerns(Per notes)   Assist Level: Supervision/Verbal cueing    Medical Problem List and Plan: 1.Functional deficits and left hemiparesissecondary to right pontine infarct on 11/05/2018 F/u PMR 1 mo  2. Antithrombotics: -DVT/anticoagulation:Pharmaceutical:Other  (comment)--Eliquis. -antiplatelet therapy: ASA 81 mg 3. Pain Management:tylenol prn  -OOB, ROM 4. Mood:LCSW to follow for evaluation and support. -antipsychotic agents: N/A 5. Neuropsych: This patientiscapable of making decisions on hisown behalf. 6. Skin/Wound Care:routine pressure relief measures. 7. Fluids/Electrolytes/Nutrition:Monitor I/O.  8. Dyslipidemia: On Repatha at home. 9.HTN: Monitor BID. On low dose Metoprolol bid.   Norvasc increased to 5 mg on 5/2 Vitals:   12/02/18 1950 12/03/18 0543  BP: (!) 142/58 (!) 153/62  Pulse: (!) 57 (!) 52  Resp: 16   Temp: 98.6 F (37 C) (!) 97.4 F (36.3 C)  SpO2: 98% 100%   Pt has elevated systolic increase amlodipine  to 10mg - controlled 5/21 10. Neurogenic bladder/Urinary retention:   Flomax started on 5/3,vodining well    11. Reactive depression: Now on Prozac.  12. T2DM: Monitor BS ac/hs. due to hypoglycemia, Home regimen of 70/30 insulin changed to:  35 units with breakfast     10 units with supper    CBG (last 3)  Recent Labs    12/02/18 1629 12/02/18 2142 12/03/18 0636  GLUCAP 101* 158* 108*  am CBG still on low side reduce pm 70/30 to 10U, controlled  5/20, discussed with pt and nsg, order bedtime snack      LOS: 24 days A FACE TO [place] E Mikailah Morel 12/03/2018, 7:49 AM

## 2018-12-03 NOTE — Discharge Instructions (Signed)
Inpatient Rehab Discharge Instructions  Andre Wolfe Discharge date and time: 12/03/18   Activities/Precautions/ Functional Status: Activity: no lifting, driving, or strenuous exercise till cleared by MD Diet: cardiac diet and diabetic diet Wound Care: none needed   Functional status:  ___ No restrictions     ___ Walk up steps independently _A__ 24/7 supervision/assistance   ___ Walk up steps with assistance ___ Intermittent supervision/assistance  ___ Bathe/dress independently ___ Walk with walker     __A_ Bathe/dress with assistance ___ Walk Independently    ___ Shower independently _A__ Walk with assistance    ___ Shower with assistance ___ No alcohol     ___ Return to work/school ________  Special Instructions:    My questions have been answered and I understand these instructions. I will adhere to these goals and the provided educational materials after my discharge from the hospital.  Patient/Caregiver Signature _______________________________ Date __________  Clinician Signature _______________________________________ Date __________  Please bring this form and your medication list with you to all your follow-up doctor's appointments.

## 2018-12-03 NOTE — Progress Notes (Signed)
Social Work  Discharge Note  The overall goal for the admission was met for:   Discharge location: [facility]  Length of Stay: Yes-24 DAYS  Discharge activity level: Yes-MIN ASSIST LEVEL  Home/community participation: Yes  Services provided included: MD, RD, PT, OT, SLP, RN, CM, Pharmacy, Neuropsych and SW  Financial Services: Medicare and Private Insurance: [place]  Follow-up services arranged: Other: [facility]  Comments (or additional information):PT INSISTENT ON GOING TO NH IN HIS HOMETOWN IN [place]. BED AVAILABLE AND FAMILY TO TRANSPORT TO FACILITY TODAY.  Patient/Family verbalized understanding of follow-up arrangements: Yes  Individual responsible for coordination of the follow-up plan: BECKY-DAUGHTER  Confirmed correct DME delivered: Elease Hashimoto 12/03/2018    Elease Hashimoto

## 2018-12-03 NOTE — Progress Notes (Addendum)
Pt. Is ready to go to [facility] with his family.Attempt to Report was done,message was left on the answering machine to call back [phone].

## 2018-12-04 ENCOUNTER — Telehealth: Payer: Self-pay | Admitting: Physical Medicine & Rehabilitation

## 2018-12-04 DIAGNOSIS — G9009 Other idiopathic peripheral autonomic neuropathy: Secondary | ICD-10-CM | POA: Diagnosis not present

## 2018-12-04 DIAGNOSIS — R531 Weakness: Secondary | ICD-10-CM | POA: Diagnosis not present

## 2018-12-04 NOTE — Telephone Encounter (Signed)
LVM to schedule a 49mth follow up with Dr. Letta Pate.  Patient is an already existing patient of Dr. Letta Pate.

## 2018-12-09 ENCOUNTER — Telehealth: Payer: Self-pay

## 2018-12-09 DIAGNOSIS — E119 Type 2 diabetes mellitus without complications: Secondary | ICD-10-CM | POA: Diagnosis not present

## 2018-12-09 DIAGNOSIS — I10 Essential (primary) hypertension: Secondary | ICD-10-CM | POA: Diagnosis not present

## 2018-12-09 NOTE — Telephone Encounter (Signed)
Spoke with the patient's daughter and she stated the her father is currently in a skilled nursing facility in [place] due to him having another stroke. She asked that his appt be cancelled. Appt has been cancelled.

## 2018-12-14 ENCOUNTER — Ambulatory Visit: Payer: Medicare Other | Admitting: Adult Health

## 2018-12-16 DIAGNOSIS — E119 Type 2 diabetes mellitus without complications: Secondary | ICD-10-CM | POA: Diagnosis not present

## 2018-12-16 DIAGNOSIS — I10 Essential (primary) hypertension: Secondary | ICD-10-CM | POA: Diagnosis not present

## 2018-12-16 DIAGNOSIS — I4891 Unspecified atrial fibrillation: Secondary | ICD-10-CM | POA: Diagnosis not present

## 2018-12-16 DIAGNOSIS — L89151 Pressure ulcer of sacral region, stage 1: Secondary | ICD-10-CM | POA: Diagnosis not present

## 2018-12-16 DIAGNOSIS — R262 Difficulty in walking, not elsewhere classified: Secondary | ICD-10-CM | POA: Diagnosis not present

## 2018-12-16 DIAGNOSIS — M6281 Muscle weakness (generalized): Secondary | ICD-10-CM | POA: Diagnosis not present

## 2018-12-16 DIAGNOSIS — E785 Hyperlipidemia, unspecified: Secondary | ICD-10-CM | POA: Diagnosis not present

## 2018-12-23 DIAGNOSIS — L309 Dermatitis, unspecified: Secondary | ICD-10-CM | POA: Diagnosis not present

## 2018-12-23 DIAGNOSIS — M6281 Muscle weakness (generalized): Secondary | ICD-10-CM | POA: Diagnosis not present

## 2018-12-23 DIAGNOSIS — R262 Difficulty in walking, not elsewhere classified: Secondary | ICD-10-CM | POA: Diagnosis not present

## 2018-12-23 DIAGNOSIS — L89151 Pressure ulcer of sacral region, stage 1: Secondary | ICD-10-CM | POA: Diagnosis not present

## 2018-12-29 DIAGNOSIS — F339 Major depressive disorder, recurrent, unspecified: Secondary | ICD-10-CM | POA: Diagnosis not present

## 2018-12-30 DIAGNOSIS — L89151 Pressure ulcer of sacral region, stage 1: Secondary | ICD-10-CM | POA: Diagnosis not present

## 2018-12-30 DIAGNOSIS — R262 Difficulty in walking, not elsewhere classified: Secondary | ICD-10-CM | POA: Diagnosis not present

## 2018-12-30 DIAGNOSIS — L309 Dermatitis, unspecified: Secondary | ICD-10-CM | POA: Diagnosis not present

## 2018-12-30 DIAGNOSIS — M6281 Muscle weakness (generalized): Secondary | ICD-10-CM | POA: Diagnosis not present

## 2019-01-05 DIAGNOSIS — I10 Essential (primary) hypertension: Secondary | ICD-10-CM | POA: Diagnosis not present

## 2019-01-05 DIAGNOSIS — F339 Major depressive disorder, recurrent, unspecified: Secondary | ICD-10-CM | POA: Diagnosis not present

## 2019-01-05 DIAGNOSIS — E119 Type 2 diabetes mellitus without complications: Secondary | ICD-10-CM | POA: Diagnosis not present

## 2019-01-06 DIAGNOSIS — L89151 Pressure ulcer of sacral region, stage 1: Secondary | ICD-10-CM | POA: Diagnosis not present

## 2019-01-06 DIAGNOSIS — M6281 Muscle weakness (generalized): Secondary | ICD-10-CM | POA: Diagnosis not present

## 2019-01-06 DIAGNOSIS — R262 Difficulty in walking, not elsewhere classified: Secondary | ICD-10-CM | POA: Diagnosis not present

## 2019-01-06 DIAGNOSIS — L309 Dermatitis, unspecified: Secondary | ICD-10-CM | POA: Diagnosis not present

## 2019-01-08 DIAGNOSIS — K219 Gastro-esophageal reflux disease without esophagitis: Secondary | ICD-10-CM | POA: Diagnosis not present

## 2019-01-08 DIAGNOSIS — I4891 Unspecified atrial fibrillation: Secondary | ICD-10-CM | POA: Diagnosis not present

## 2019-01-08 DIAGNOSIS — R531 Weakness: Secondary | ICD-10-CM | POA: Diagnosis not present

## 2019-01-13 DIAGNOSIS — L309 Dermatitis, unspecified: Secondary | ICD-10-CM | POA: Diagnosis not present

## 2019-01-13 DIAGNOSIS — L89151 Pressure ulcer of sacral region, stage 1: Secondary | ICD-10-CM | POA: Diagnosis not present

## 2019-01-13 DIAGNOSIS — R262 Difficulty in walking, not elsewhere classified: Secondary | ICD-10-CM | POA: Diagnosis not present

## 2019-01-13 DIAGNOSIS — M6281 Muscle weakness (generalized): Secondary | ICD-10-CM | POA: Diagnosis not present

## 2019-01-20 DIAGNOSIS — R262 Difficulty in walking, not elsewhere classified: Secondary | ICD-10-CM | POA: Diagnosis not present

## 2019-01-20 DIAGNOSIS — L89151 Pressure ulcer of sacral region, stage 1: Secondary | ICD-10-CM | POA: Diagnosis not present

## 2019-01-20 DIAGNOSIS — L309 Dermatitis, unspecified: Secondary | ICD-10-CM | POA: Diagnosis not present

## 2019-01-20 DIAGNOSIS — M6281 Muscle weakness (generalized): Secondary | ICD-10-CM | POA: Diagnosis not present

## 2019-01-27 DIAGNOSIS — E785 Hyperlipidemia, unspecified: Secondary | ICD-10-CM | POA: Diagnosis not present

## 2019-01-27 DIAGNOSIS — M6281 Muscle weakness (generalized): Secondary | ICD-10-CM | POA: Diagnosis not present

## 2019-01-27 DIAGNOSIS — R262 Difficulty in walking, not elsewhere classified: Secondary | ICD-10-CM | POA: Diagnosis not present

## 2019-01-27 DIAGNOSIS — L309 Dermatitis, unspecified: Secondary | ICD-10-CM | POA: Diagnosis not present

## 2019-02-03 DIAGNOSIS — L309 Dermatitis, unspecified: Secondary | ICD-10-CM | POA: Diagnosis not present

## 2019-02-03 DIAGNOSIS — E785 Hyperlipidemia, unspecified: Secondary | ICD-10-CM | POA: Diagnosis not present

## 2019-02-03 DIAGNOSIS — R262 Difficulty in walking, not elsewhere classified: Secondary | ICD-10-CM | POA: Diagnosis not present

## 2019-02-03 DIAGNOSIS — M6281 Muscle weakness (generalized): Secondary | ICD-10-CM | POA: Diagnosis not present

## 2019-02-03 DIAGNOSIS — F339 Major depressive disorder, recurrent, unspecified: Secondary | ICD-10-CM | POA: Diagnosis not present

## 2019-02-10 DIAGNOSIS — R262 Difficulty in walking, not elsewhere classified: Secondary | ICD-10-CM | POA: Diagnosis not present

## 2019-02-10 DIAGNOSIS — M6281 Muscle weakness (generalized): Secondary | ICD-10-CM | POA: Diagnosis not present

## 2019-02-10 DIAGNOSIS — L309 Dermatitis, unspecified: Secondary | ICD-10-CM | POA: Diagnosis not present

## 2019-02-19 DIAGNOSIS — L309 Dermatitis, unspecified: Secondary | ICD-10-CM | POA: Diagnosis not present

## 2019-02-19 DIAGNOSIS — M6281 Muscle weakness (generalized): Secondary | ICD-10-CM | POA: Diagnosis not present

## 2019-02-19 DIAGNOSIS — R262 Difficulty in walking, not elsewhere classified: Secondary | ICD-10-CM | POA: Diagnosis not present

## 2019-02-24 DIAGNOSIS — M6281 Muscle weakness (generalized): Secondary | ICD-10-CM | POA: Diagnosis not present

## 2019-02-24 DIAGNOSIS — L309 Dermatitis, unspecified: Secondary | ICD-10-CM | POA: Diagnosis not present

## 2019-02-24 DIAGNOSIS — R262 Difficulty in walking, not elsewhere classified: Secondary | ICD-10-CM | POA: Diagnosis not present

## 2019-03-10 ENCOUNTER — Ambulatory Visit: Payer: Medicare Other | Admitting: Cardiology

## 2019-03-24 DIAGNOSIS — I4891 Unspecified atrial fibrillation: Secondary | ICD-10-CM | POA: Diagnosis not present

## 2019-03-24 DIAGNOSIS — E119 Type 2 diabetes mellitus without complications: Secondary | ICD-10-CM | POA: Diagnosis not present

## 2019-03-24 DIAGNOSIS — Z794 Long term (current) use of insulin: Secondary | ICD-10-CM | POA: Diagnosis not present

## 2019-03-24 DIAGNOSIS — U071 COVID-19: Secondary | ICD-10-CM | POA: Diagnosis not present

## 2019-03-31 DIAGNOSIS — U071 COVID-19: Secondary | ICD-10-CM | POA: Diagnosis not present

## 2019-03-31 DIAGNOSIS — I10 Essential (primary) hypertension: Secondary | ICD-10-CM | POA: Diagnosis not present

## 2019-03-31 DIAGNOSIS — I4891 Unspecified atrial fibrillation: Secondary | ICD-10-CM | POA: Diagnosis not present

## 2019-03-31 DIAGNOSIS — R110 Nausea: Secondary | ICD-10-CM | POA: Diagnosis not present

## 2019-04-08 DIAGNOSIS — I69354 Hemiplegia and hemiparesis following cerebral infarction affecting left non-dominant side: Secondary | ICD-10-CM | POA: Diagnosis not present

## 2019-04-08 DIAGNOSIS — M6281 Muscle weakness (generalized): Secondary | ICD-10-CM | POA: Diagnosis not present

## 2019-04-08 DIAGNOSIS — R262 Difficulty in walking, not elsewhere classified: Secondary | ICD-10-CM | POA: Diagnosis not present

## 2019-04-08 DIAGNOSIS — R278 Other lack of coordination: Secondary | ICD-10-CM | POA: Diagnosis not present

## 2019-04-09 DIAGNOSIS — R278 Other lack of coordination: Secondary | ICD-10-CM | POA: Diagnosis not present

## 2019-04-09 DIAGNOSIS — R262 Difficulty in walking, not elsewhere classified: Secondary | ICD-10-CM | POA: Diagnosis not present

## 2019-04-09 DIAGNOSIS — I69354 Hemiplegia and hemiparesis following cerebral infarction affecting left non-dominant side: Secondary | ICD-10-CM | POA: Diagnosis not present

## 2019-04-09 DIAGNOSIS — M6281 Muscle weakness (generalized): Secondary | ICD-10-CM | POA: Diagnosis not present

## 2019-04-12 DIAGNOSIS — M6281 Muscle weakness (generalized): Secondary | ICD-10-CM | POA: Diagnosis not present

## 2019-04-12 DIAGNOSIS — I69354 Hemiplegia and hemiparesis following cerebral infarction affecting left non-dominant side: Secondary | ICD-10-CM | POA: Diagnosis not present

## 2019-04-12 DIAGNOSIS — R278 Other lack of coordination: Secondary | ICD-10-CM | POA: Diagnosis not present

## 2019-04-12 DIAGNOSIS — R262 Difficulty in walking, not elsewhere classified: Secondary | ICD-10-CM | POA: Diagnosis not present

## 2019-04-13 DIAGNOSIS — I69354 Hemiplegia and hemiparesis following cerebral infarction affecting left non-dominant side: Secondary | ICD-10-CM | POA: Diagnosis not present

## 2019-04-13 DIAGNOSIS — R278 Other lack of coordination: Secondary | ICD-10-CM | POA: Diagnosis not present

## 2019-04-13 DIAGNOSIS — R262 Difficulty in walking, not elsewhere classified: Secondary | ICD-10-CM | POA: Diagnosis not present

## 2019-04-13 DIAGNOSIS — M6281 Muscle weakness (generalized): Secondary | ICD-10-CM | POA: Diagnosis not present

## 2019-04-14 DIAGNOSIS — M6281 Muscle weakness (generalized): Secondary | ICD-10-CM | POA: Diagnosis not present

## 2019-04-14 DIAGNOSIS — Z794 Long term (current) use of insulin: Secondary | ICD-10-CM | POA: Diagnosis not present

## 2019-04-14 DIAGNOSIS — I639 Cerebral infarction, unspecified: Secondary | ICD-10-CM | POA: Diagnosis not present

## 2019-04-14 DIAGNOSIS — E119 Type 2 diabetes mellitus without complications: Secondary | ICD-10-CM | POA: Diagnosis not present

## 2019-04-14 DIAGNOSIS — R278 Other lack of coordination: Secondary | ICD-10-CM | POA: Diagnosis not present

## 2019-04-14 DIAGNOSIS — R531 Weakness: Secondary | ICD-10-CM | POA: Diagnosis not present

## 2019-04-14 DIAGNOSIS — R262 Difficulty in walking, not elsewhere classified: Secondary | ICD-10-CM | POA: Diagnosis not present

## 2019-04-14 DIAGNOSIS — U071 COVID-19: Secondary | ICD-10-CM | POA: Diagnosis not present

## 2019-04-14 DIAGNOSIS — I69354 Hemiplegia and hemiparesis following cerebral infarction affecting left non-dominant side: Secondary | ICD-10-CM | POA: Diagnosis not present

## 2019-04-15 DIAGNOSIS — M6281 Muscle weakness (generalized): Secondary | ICD-10-CM | POA: Diagnosis not present

## 2019-04-15 DIAGNOSIS — R262 Difficulty in walking, not elsewhere classified: Secondary | ICD-10-CM | POA: Diagnosis not present

## 2019-04-15 DIAGNOSIS — I63219 Cerebral infarction due to unspecified occlusion or stenosis of unspecified vertebral arteries: Secondary | ICD-10-CM | POA: Diagnosis not present

## 2019-04-15 DIAGNOSIS — I69354 Hemiplegia and hemiparesis following cerebral infarction affecting left non-dominant side: Secondary | ICD-10-CM | POA: Diagnosis not present

## 2019-04-15 DIAGNOSIS — R278 Other lack of coordination: Secondary | ICD-10-CM | POA: Diagnosis not present

## 2019-04-16 DIAGNOSIS — I63219 Cerebral infarction due to unspecified occlusion or stenosis of unspecified vertebral arteries: Secondary | ICD-10-CM | POA: Diagnosis not present

## 2019-04-16 DIAGNOSIS — R262 Difficulty in walking, not elsewhere classified: Secondary | ICD-10-CM | POA: Diagnosis not present

## 2019-04-16 DIAGNOSIS — I69354 Hemiplegia and hemiparesis following cerebral infarction affecting left non-dominant side: Secondary | ICD-10-CM | POA: Diagnosis not present

## 2019-04-16 DIAGNOSIS — M6281 Muscle weakness (generalized): Secondary | ICD-10-CM | POA: Diagnosis not present

## 2019-04-16 DIAGNOSIS — R278 Other lack of coordination: Secondary | ICD-10-CM | POA: Diagnosis not present

## 2019-04-19 DIAGNOSIS — I63219 Cerebral infarction due to unspecified occlusion or stenosis of unspecified vertebral arteries: Secondary | ICD-10-CM | POA: Diagnosis not present

## 2019-04-19 DIAGNOSIS — M6281 Muscle weakness (generalized): Secondary | ICD-10-CM | POA: Diagnosis not present

## 2019-04-19 DIAGNOSIS — I69354 Hemiplegia and hemiparesis following cerebral infarction affecting left non-dominant side: Secondary | ICD-10-CM | POA: Diagnosis not present

## 2019-04-19 DIAGNOSIS — R278 Other lack of coordination: Secondary | ICD-10-CM | POA: Diagnosis not present

## 2019-04-19 DIAGNOSIS — R262 Difficulty in walking, not elsewhere classified: Secondary | ICD-10-CM | POA: Diagnosis not present

## 2019-04-20 DIAGNOSIS — I69354 Hemiplegia and hemiparesis following cerebral infarction affecting left non-dominant side: Secondary | ICD-10-CM | POA: Diagnosis not present

## 2019-04-20 DIAGNOSIS — I63219 Cerebral infarction due to unspecified occlusion or stenosis of unspecified vertebral arteries: Secondary | ICD-10-CM | POA: Diagnosis not present

## 2019-04-20 DIAGNOSIS — R278 Other lack of coordination: Secondary | ICD-10-CM | POA: Diagnosis not present

## 2019-04-20 DIAGNOSIS — M6281 Muscle weakness (generalized): Secondary | ICD-10-CM | POA: Diagnosis not present

## 2019-04-20 DIAGNOSIS — R262 Difficulty in walking, not elsewhere classified: Secondary | ICD-10-CM | POA: Diagnosis not present

## 2019-04-21 DIAGNOSIS — R262 Difficulty in walking, not elsewhere classified: Secondary | ICD-10-CM | POA: Diagnosis not present

## 2019-04-21 DIAGNOSIS — R278 Other lack of coordination: Secondary | ICD-10-CM | POA: Diagnosis not present

## 2019-04-21 DIAGNOSIS — I69354 Hemiplegia and hemiparesis following cerebral infarction affecting left non-dominant side: Secondary | ICD-10-CM | POA: Diagnosis not present

## 2019-04-21 DIAGNOSIS — I63219 Cerebral infarction due to unspecified occlusion or stenosis of unspecified vertebral arteries: Secondary | ICD-10-CM | POA: Diagnosis not present

## 2019-04-21 DIAGNOSIS — M6281 Muscle weakness (generalized): Secondary | ICD-10-CM | POA: Diagnosis not present

## 2019-04-22 DIAGNOSIS — R278 Other lack of coordination: Secondary | ICD-10-CM | POA: Diagnosis not present

## 2019-04-22 DIAGNOSIS — I69354 Hemiplegia and hemiparesis following cerebral infarction affecting left non-dominant side: Secondary | ICD-10-CM | POA: Diagnosis not present

## 2019-04-22 DIAGNOSIS — M6281 Muscle weakness (generalized): Secondary | ICD-10-CM | POA: Diagnosis not present

## 2019-04-22 DIAGNOSIS — R262 Difficulty in walking, not elsewhere classified: Secondary | ICD-10-CM | POA: Diagnosis not present

## 2019-04-22 DIAGNOSIS — I63219 Cerebral infarction due to unspecified occlusion or stenosis of unspecified vertebral arteries: Secondary | ICD-10-CM | POA: Diagnosis not present

## 2019-04-23 DIAGNOSIS — M6281 Muscle weakness (generalized): Secondary | ICD-10-CM | POA: Diagnosis not present

## 2019-04-23 DIAGNOSIS — R262 Difficulty in walking, not elsewhere classified: Secondary | ICD-10-CM | POA: Diagnosis not present

## 2019-04-23 DIAGNOSIS — R278 Other lack of coordination: Secondary | ICD-10-CM | POA: Diagnosis not present

## 2019-04-23 DIAGNOSIS — I63219 Cerebral infarction due to unspecified occlusion or stenosis of unspecified vertebral arteries: Secondary | ICD-10-CM | POA: Diagnosis not present

## 2019-04-23 DIAGNOSIS — I69354 Hemiplegia and hemiparesis following cerebral infarction affecting left non-dominant side: Secondary | ICD-10-CM | POA: Diagnosis not present

## 2019-04-26 DIAGNOSIS — I63219 Cerebral infarction due to unspecified occlusion or stenosis of unspecified vertebral arteries: Secondary | ICD-10-CM | POA: Diagnosis not present

## 2019-04-26 DIAGNOSIS — R278 Other lack of coordination: Secondary | ICD-10-CM | POA: Diagnosis not present

## 2019-04-26 DIAGNOSIS — I69354 Hemiplegia and hemiparesis following cerebral infarction affecting left non-dominant side: Secondary | ICD-10-CM | POA: Diagnosis not present

## 2019-04-26 DIAGNOSIS — R262 Difficulty in walking, not elsewhere classified: Secondary | ICD-10-CM | POA: Diagnosis not present

## 2019-04-26 DIAGNOSIS — M6281 Muscle weakness (generalized): Secondary | ICD-10-CM | POA: Diagnosis not present

## 2019-04-27 DIAGNOSIS — I69354 Hemiplegia and hemiparesis following cerebral infarction affecting left non-dominant side: Secondary | ICD-10-CM | POA: Diagnosis not present

## 2019-04-27 DIAGNOSIS — R262 Difficulty in walking, not elsewhere classified: Secondary | ICD-10-CM | POA: Diagnosis not present

## 2019-04-27 DIAGNOSIS — R278 Other lack of coordination: Secondary | ICD-10-CM | POA: Diagnosis not present

## 2019-04-27 DIAGNOSIS — M6281 Muscle weakness (generalized): Secondary | ICD-10-CM | POA: Diagnosis not present

## 2019-04-27 DIAGNOSIS — I63219 Cerebral infarction due to unspecified occlusion or stenosis of unspecified vertebral arteries: Secondary | ICD-10-CM | POA: Diagnosis not present

## 2019-04-28 DIAGNOSIS — M6281 Muscle weakness (generalized): Secondary | ICD-10-CM | POA: Diagnosis not present

## 2019-04-28 DIAGNOSIS — I69354 Hemiplegia and hemiparesis following cerebral infarction affecting left non-dominant side: Secondary | ICD-10-CM | POA: Diagnosis not present

## 2019-04-28 DIAGNOSIS — R262 Difficulty in walking, not elsewhere classified: Secondary | ICD-10-CM | POA: Diagnosis not present

## 2019-04-28 DIAGNOSIS — I63219 Cerebral infarction due to unspecified occlusion or stenosis of unspecified vertebral arteries: Secondary | ICD-10-CM | POA: Diagnosis not present

## 2019-04-28 DIAGNOSIS — R278 Other lack of coordination: Secondary | ICD-10-CM | POA: Diagnosis not present

## 2019-04-29 DIAGNOSIS — R278 Other lack of coordination: Secondary | ICD-10-CM | POA: Diagnosis not present

## 2019-04-29 DIAGNOSIS — M6281 Muscle weakness (generalized): Secondary | ICD-10-CM | POA: Diagnosis not present

## 2019-04-29 DIAGNOSIS — R262 Difficulty in walking, not elsewhere classified: Secondary | ICD-10-CM | POA: Diagnosis not present

## 2019-04-29 DIAGNOSIS — I63219 Cerebral infarction due to unspecified occlusion or stenosis of unspecified vertebral arteries: Secondary | ICD-10-CM | POA: Diagnosis not present

## 2019-04-29 DIAGNOSIS — I69354 Hemiplegia and hemiparesis following cerebral infarction affecting left non-dominant side: Secondary | ICD-10-CM | POA: Diagnosis not present

## 2019-04-30 DIAGNOSIS — I63219 Cerebral infarction due to unspecified occlusion or stenosis of unspecified vertebral arteries: Secondary | ICD-10-CM | POA: Diagnosis not present

## 2019-04-30 DIAGNOSIS — M6281 Muscle weakness (generalized): Secondary | ICD-10-CM | POA: Diagnosis not present

## 2019-04-30 DIAGNOSIS — R278 Other lack of coordination: Secondary | ICD-10-CM | POA: Diagnosis not present

## 2019-04-30 DIAGNOSIS — I69354 Hemiplegia and hemiparesis following cerebral infarction affecting left non-dominant side: Secondary | ICD-10-CM | POA: Diagnosis not present

## 2019-04-30 DIAGNOSIS — R262 Difficulty in walking, not elsewhere classified: Secondary | ICD-10-CM | POA: Diagnosis not present

## 2019-04-30 DIAGNOSIS — Z23 Encounter for immunization: Secondary | ICD-10-CM | POA: Diagnosis not present

## 2019-05-03 DIAGNOSIS — I63219 Cerebral infarction due to unspecified occlusion or stenosis of unspecified vertebral arteries: Secondary | ICD-10-CM | POA: Diagnosis not present

## 2019-05-03 DIAGNOSIS — M6281 Muscle weakness (generalized): Secondary | ICD-10-CM | POA: Diagnosis not present

## 2019-05-03 DIAGNOSIS — R278 Other lack of coordination: Secondary | ICD-10-CM | POA: Diagnosis not present

## 2019-05-03 DIAGNOSIS — R262 Difficulty in walking, not elsewhere classified: Secondary | ICD-10-CM | POA: Diagnosis not present

## 2019-05-03 DIAGNOSIS — I69354 Hemiplegia and hemiparesis following cerebral infarction affecting left non-dominant side: Secondary | ICD-10-CM | POA: Diagnosis not present

## 2019-05-04 DIAGNOSIS — R262 Difficulty in walking, not elsewhere classified: Secondary | ICD-10-CM | POA: Diagnosis not present

## 2019-05-04 DIAGNOSIS — I69354 Hemiplegia and hemiparesis following cerebral infarction affecting left non-dominant side: Secondary | ICD-10-CM | POA: Diagnosis not present

## 2019-05-04 DIAGNOSIS — R278 Other lack of coordination: Secondary | ICD-10-CM | POA: Diagnosis not present

## 2019-05-04 DIAGNOSIS — M6281 Muscle weakness (generalized): Secondary | ICD-10-CM | POA: Diagnosis not present

## 2019-05-04 DIAGNOSIS — I63219 Cerebral infarction due to unspecified occlusion or stenosis of unspecified vertebral arteries: Secondary | ICD-10-CM | POA: Diagnosis not present

## 2019-05-05 DIAGNOSIS — R262 Difficulty in walking, not elsewhere classified: Secondary | ICD-10-CM | POA: Diagnosis not present

## 2019-05-05 DIAGNOSIS — R278 Other lack of coordination: Secondary | ICD-10-CM | POA: Diagnosis not present

## 2019-05-05 DIAGNOSIS — M6281 Muscle weakness (generalized): Secondary | ICD-10-CM | POA: Diagnosis not present

## 2019-05-05 DIAGNOSIS — I63219 Cerebral infarction due to unspecified occlusion or stenosis of unspecified vertebral arteries: Secondary | ICD-10-CM | POA: Diagnosis not present

## 2019-05-05 DIAGNOSIS — I69354 Hemiplegia and hemiparesis following cerebral infarction affecting left non-dominant side: Secondary | ICD-10-CM | POA: Diagnosis not present

## 2019-05-06 DIAGNOSIS — M6281 Muscle weakness (generalized): Secondary | ICD-10-CM | POA: Diagnosis not present

## 2019-05-06 DIAGNOSIS — I63219 Cerebral infarction due to unspecified occlusion or stenosis of unspecified vertebral arteries: Secondary | ICD-10-CM | POA: Diagnosis not present

## 2019-05-06 DIAGNOSIS — R278 Other lack of coordination: Secondary | ICD-10-CM | POA: Diagnosis not present

## 2019-05-06 DIAGNOSIS — I69354 Hemiplegia and hemiparesis following cerebral infarction affecting left non-dominant side: Secondary | ICD-10-CM | POA: Diagnosis not present

## 2019-05-06 DIAGNOSIS — R262 Difficulty in walking, not elsewhere classified: Secondary | ICD-10-CM | POA: Diagnosis not present

## 2019-05-07 DIAGNOSIS — R262 Difficulty in walking, not elsewhere classified: Secondary | ICD-10-CM | POA: Diagnosis not present

## 2019-05-07 DIAGNOSIS — M6281 Muscle weakness (generalized): Secondary | ICD-10-CM | POA: Diagnosis not present

## 2019-05-07 DIAGNOSIS — R278 Other lack of coordination: Secondary | ICD-10-CM | POA: Diagnosis not present

## 2019-05-07 DIAGNOSIS — I69354 Hemiplegia and hemiparesis following cerebral infarction affecting left non-dominant side: Secondary | ICD-10-CM | POA: Diagnosis not present

## 2019-05-07 DIAGNOSIS — I63219 Cerebral infarction due to unspecified occlusion or stenosis of unspecified vertebral arteries: Secondary | ICD-10-CM | POA: Diagnosis not present

## 2019-05-10 DIAGNOSIS — I63219 Cerebral infarction due to unspecified occlusion or stenosis of unspecified vertebral arteries: Secondary | ICD-10-CM | POA: Diagnosis not present

## 2019-05-10 DIAGNOSIS — R278 Other lack of coordination: Secondary | ICD-10-CM | POA: Diagnosis not present

## 2019-05-10 DIAGNOSIS — R262 Difficulty in walking, not elsewhere classified: Secondary | ICD-10-CM | POA: Diagnosis not present

## 2019-05-10 DIAGNOSIS — M6281 Muscle weakness (generalized): Secondary | ICD-10-CM | POA: Diagnosis not present

## 2019-05-10 DIAGNOSIS — I69354 Hemiplegia and hemiparesis following cerebral infarction affecting left non-dominant side: Secondary | ICD-10-CM | POA: Diagnosis not present

## 2019-05-11 DIAGNOSIS — R262 Difficulty in walking, not elsewhere classified: Secondary | ICD-10-CM | POA: Diagnosis not present

## 2019-05-11 DIAGNOSIS — M6281 Muscle weakness (generalized): Secondary | ICD-10-CM | POA: Diagnosis not present

## 2019-05-11 DIAGNOSIS — R278 Other lack of coordination: Secondary | ICD-10-CM | POA: Diagnosis not present

## 2019-05-11 DIAGNOSIS — I69354 Hemiplegia and hemiparesis following cerebral infarction affecting left non-dominant side: Secondary | ICD-10-CM | POA: Diagnosis not present

## 2019-05-11 DIAGNOSIS — I63219 Cerebral infarction due to unspecified occlusion or stenosis of unspecified vertebral arteries: Secondary | ICD-10-CM | POA: Diagnosis not present

## 2019-05-12 DIAGNOSIS — I63219 Cerebral infarction due to unspecified occlusion or stenosis of unspecified vertebral arteries: Secondary | ICD-10-CM | POA: Diagnosis not present

## 2019-05-12 DIAGNOSIS — I69354 Hemiplegia and hemiparesis following cerebral infarction affecting left non-dominant side: Secondary | ICD-10-CM | POA: Diagnosis not present

## 2019-05-12 DIAGNOSIS — R262 Difficulty in walking, not elsewhere classified: Secondary | ICD-10-CM | POA: Diagnosis not present

## 2019-05-12 DIAGNOSIS — R278 Other lack of coordination: Secondary | ICD-10-CM | POA: Diagnosis not present

## 2019-05-12 DIAGNOSIS — M6281 Muscle weakness (generalized): Secondary | ICD-10-CM | POA: Diagnosis not present

## 2019-05-13 DIAGNOSIS — I63219 Cerebral infarction due to unspecified occlusion or stenosis of unspecified vertebral arteries: Secondary | ICD-10-CM | POA: Diagnosis not present

## 2019-05-13 DIAGNOSIS — I69354 Hemiplegia and hemiparesis following cerebral infarction affecting left non-dominant side: Secondary | ICD-10-CM | POA: Diagnosis not present

## 2019-05-13 DIAGNOSIS — R262 Difficulty in walking, not elsewhere classified: Secondary | ICD-10-CM | POA: Diagnosis not present

## 2019-05-13 DIAGNOSIS — R278 Other lack of coordination: Secondary | ICD-10-CM | POA: Diagnosis not present

## 2019-05-13 DIAGNOSIS — M6281 Muscle weakness (generalized): Secondary | ICD-10-CM | POA: Diagnosis not present

## 2019-05-14 DIAGNOSIS — I63219 Cerebral infarction due to unspecified occlusion or stenosis of unspecified vertebral arteries: Secondary | ICD-10-CM | POA: Diagnosis not present

## 2019-05-14 DIAGNOSIS — R278 Other lack of coordination: Secondary | ICD-10-CM | POA: Diagnosis not present

## 2019-05-14 DIAGNOSIS — R262 Difficulty in walking, not elsewhere classified: Secondary | ICD-10-CM | POA: Diagnosis not present

## 2019-05-14 DIAGNOSIS — I69354 Hemiplegia and hemiparesis following cerebral infarction affecting left non-dominant side: Secondary | ICD-10-CM | POA: Diagnosis not present

## 2019-05-14 DIAGNOSIS — M6281 Muscle weakness (generalized): Secondary | ICD-10-CM | POA: Diagnosis not present

## 2019-05-15 DIAGNOSIS — I63219 Cerebral infarction due to unspecified occlusion or stenosis of unspecified vertebral arteries: Secondary | ICD-10-CM | POA: Diagnosis not present

## 2019-05-15 DIAGNOSIS — R278 Other lack of coordination: Secondary | ICD-10-CM | POA: Diagnosis not present

## 2019-05-15 DIAGNOSIS — I69354 Hemiplegia and hemiparesis following cerebral infarction affecting left non-dominant side: Secondary | ICD-10-CM | POA: Diagnosis not present

## 2019-05-15 DIAGNOSIS — R262 Difficulty in walking, not elsewhere classified: Secondary | ICD-10-CM | POA: Diagnosis not present

## 2019-05-15 DIAGNOSIS — M6281 Muscle weakness (generalized): Secondary | ICD-10-CM | POA: Diagnosis not present

## 2019-05-17 DIAGNOSIS — R278 Other lack of coordination: Secondary | ICD-10-CM | POA: Diagnosis not present

## 2019-05-17 DIAGNOSIS — I69354 Hemiplegia and hemiparesis following cerebral infarction affecting left non-dominant side: Secondary | ICD-10-CM | POA: Diagnosis not present

## 2019-05-17 DIAGNOSIS — M6281 Muscle weakness (generalized): Secondary | ICD-10-CM | POA: Diagnosis not present

## 2019-05-17 DIAGNOSIS — R262 Difficulty in walking, not elsewhere classified: Secondary | ICD-10-CM | POA: Diagnosis not present

## 2019-05-17 DIAGNOSIS — I63219 Cerebral infarction due to unspecified occlusion or stenosis of unspecified vertebral arteries: Secondary | ICD-10-CM | POA: Diagnosis not present

## 2019-05-18 DIAGNOSIS — R278 Other lack of coordination: Secondary | ICD-10-CM | POA: Diagnosis not present

## 2019-05-18 DIAGNOSIS — R262 Difficulty in walking, not elsewhere classified: Secondary | ICD-10-CM | POA: Diagnosis not present

## 2019-05-18 DIAGNOSIS — I63219 Cerebral infarction due to unspecified occlusion or stenosis of unspecified vertebral arteries: Secondary | ICD-10-CM | POA: Diagnosis not present

## 2019-05-18 DIAGNOSIS — M6281 Muscle weakness (generalized): Secondary | ICD-10-CM | POA: Diagnosis not present

## 2019-05-18 DIAGNOSIS — I69354 Hemiplegia and hemiparesis following cerebral infarction affecting left non-dominant side: Secondary | ICD-10-CM | POA: Diagnosis not present

## 2019-05-19 DIAGNOSIS — R262 Difficulty in walking, not elsewhere classified: Secondary | ICD-10-CM | POA: Diagnosis not present

## 2019-05-19 DIAGNOSIS — I63219 Cerebral infarction due to unspecified occlusion or stenosis of unspecified vertebral arteries: Secondary | ICD-10-CM | POA: Diagnosis not present

## 2019-05-19 DIAGNOSIS — I69354 Hemiplegia and hemiparesis following cerebral infarction affecting left non-dominant side: Secondary | ICD-10-CM | POA: Diagnosis not present

## 2019-05-19 DIAGNOSIS — R278 Other lack of coordination: Secondary | ICD-10-CM | POA: Diagnosis not present

## 2019-05-19 DIAGNOSIS — M6281 Muscle weakness (generalized): Secondary | ICD-10-CM | POA: Diagnosis not present

## 2019-05-20 DIAGNOSIS — R278 Other lack of coordination: Secondary | ICD-10-CM | POA: Diagnosis not present

## 2019-05-20 DIAGNOSIS — R262 Difficulty in walking, not elsewhere classified: Secondary | ICD-10-CM | POA: Diagnosis not present

## 2019-05-20 DIAGNOSIS — M6281 Muscle weakness (generalized): Secondary | ICD-10-CM | POA: Diagnosis not present

## 2019-05-20 DIAGNOSIS — I69354 Hemiplegia and hemiparesis following cerebral infarction affecting left non-dominant side: Secondary | ICD-10-CM | POA: Diagnosis not present

## 2019-05-20 DIAGNOSIS — I63219 Cerebral infarction due to unspecified occlusion or stenosis of unspecified vertebral arteries: Secondary | ICD-10-CM | POA: Diagnosis not present

## 2019-05-21 DIAGNOSIS — M6281 Muscle weakness (generalized): Secondary | ICD-10-CM | POA: Diagnosis not present

## 2019-05-21 DIAGNOSIS — R278 Other lack of coordination: Secondary | ICD-10-CM | POA: Diagnosis not present

## 2019-05-21 DIAGNOSIS — I63219 Cerebral infarction due to unspecified occlusion or stenosis of unspecified vertebral arteries: Secondary | ICD-10-CM | POA: Diagnosis not present

## 2019-05-21 DIAGNOSIS — I69354 Hemiplegia and hemiparesis following cerebral infarction affecting left non-dominant side: Secondary | ICD-10-CM | POA: Diagnosis not present

## 2019-05-21 DIAGNOSIS — R262 Difficulty in walking, not elsewhere classified: Secondary | ICD-10-CM | POA: Diagnosis not present

## 2019-05-22 DIAGNOSIS — I63219 Cerebral infarction due to unspecified occlusion or stenosis of unspecified vertebral arteries: Secondary | ICD-10-CM | POA: Diagnosis not present

## 2019-05-22 DIAGNOSIS — R278 Other lack of coordination: Secondary | ICD-10-CM | POA: Diagnosis not present

## 2019-05-22 DIAGNOSIS — M6281 Muscle weakness (generalized): Secondary | ICD-10-CM | POA: Diagnosis not present

## 2019-05-22 DIAGNOSIS — R262 Difficulty in walking, not elsewhere classified: Secondary | ICD-10-CM | POA: Diagnosis not present

## 2019-05-22 DIAGNOSIS — I69354 Hemiplegia and hemiparesis following cerebral infarction affecting left non-dominant side: Secondary | ICD-10-CM | POA: Diagnosis not present

## 2019-05-24 DIAGNOSIS — I69354 Hemiplegia and hemiparesis following cerebral infarction affecting left non-dominant side: Secondary | ICD-10-CM | POA: Diagnosis not present

## 2019-05-24 DIAGNOSIS — M6281 Muscle weakness (generalized): Secondary | ICD-10-CM | POA: Diagnosis not present

## 2019-05-24 DIAGNOSIS — R262 Difficulty in walking, not elsewhere classified: Secondary | ICD-10-CM | POA: Diagnosis not present

## 2019-05-24 DIAGNOSIS — I63219 Cerebral infarction due to unspecified occlusion or stenosis of unspecified vertebral arteries: Secondary | ICD-10-CM | POA: Diagnosis not present

## 2019-05-24 DIAGNOSIS — R278 Other lack of coordination: Secondary | ICD-10-CM | POA: Diagnosis not present

## 2019-05-25 DIAGNOSIS — M6281 Muscle weakness (generalized): Secondary | ICD-10-CM | POA: Diagnosis not present

## 2019-05-25 DIAGNOSIS — I63219 Cerebral infarction due to unspecified occlusion or stenosis of unspecified vertebral arteries: Secondary | ICD-10-CM | POA: Diagnosis not present

## 2019-05-25 DIAGNOSIS — R262 Difficulty in walking, not elsewhere classified: Secondary | ICD-10-CM | POA: Diagnosis not present

## 2019-05-25 DIAGNOSIS — I69354 Hemiplegia and hemiparesis following cerebral infarction affecting left non-dominant side: Secondary | ICD-10-CM | POA: Diagnosis not present

## 2019-05-25 DIAGNOSIS — R278 Other lack of coordination: Secondary | ICD-10-CM | POA: Diagnosis not present

## 2019-05-26 DIAGNOSIS — M6281 Muscle weakness (generalized): Secondary | ICD-10-CM | POA: Diagnosis not present

## 2019-05-26 DIAGNOSIS — R278 Other lack of coordination: Secondary | ICD-10-CM | POA: Diagnosis not present

## 2019-05-26 DIAGNOSIS — R262 Difficulty in walking, not elsewhere classified: Secondary | ICD-10-CM | POA: Diagnosis not present

## 2019-05-26 DIAGNOSIS — I69354 Hemiplegia and hemiparesis following cerebral infarction affecting left non-dominant side: Secondary | ICD-10-CM | POA: Diagnosis not present

## 2019-05-26 DIAGNOSIS — I63219 Cerebral infarction due to unspecified occlusion or stenosis of unspecified vertebral arteries: Secondary | ICD-10-CM | POA: Diagnosis not present

## 2019-05-27 DIAGNOSIS — I63219 Cerebral infarction due to unspecified occlusion or stenosis of unspecified vertebral arteries: Secondary | ICD-10-CM | POA: Diagnosis not present

## 2019-05-27 DIAGNOSIS — R278 Other lack of coordination: Secondary | ICD-10-CM | POA: Diagnosis not present

## 2019-05-27 DIAGNOSIS — R262 Difficulty in walking, not elsewhere classified: Secondary | ICD-10-CM | POA: Diagnosis not present

## 2019-05-27 DIAGNOSIS — M6281 Muscle weakness (generalized): Secondary | ICD-10-CM | POA: Diagnosis not present

## 2019-05-27 DIAGNOSIS — I69354 Hemiplegia and hemiparesis following cerebral infarction affecting left non-dominant side: Secondary | ICD-10-CM | POA: Diagnosis not present

## 2019-05-28 DIAGNOSIS — R262 Difficulty in walking, not elsewhere classified: Secondary | ICD-10-CM | POA: Diagnosis not present

## 2019-05-28 DIAGNOSIS — R278 Other lack of coordination: Secondary | ICD-10-CM | POA: Diagnosis not present

## 2019-05-28 DIAGNOSIS — I63219 Cerebral infarction due to unspecified occlusion or stenosis of unspecified vertebral arteries: Secondary | ICD-10-CM | POA: Diagnosis not present

## 2019-05-28 DIAGNOSIS — I69354 Hemiplegia and hemiparesis following cerebral infarction affecting left non-dominant side: Secondary | ICD-10-CM | POA: Diagnosis not present

## 2019-05-28 DIAGNOSIS — M6281 Muscle weakness (generalized): Secondary | ICD-10-CM | POA: Diagnosis not present

## 2019-05-31 DIAGNOSIS — I63219 Cerebral infarction due to unspecified occlusion or stenosis of unspecified vertebral arteries: Secondary | ICD-10-CM | POA: Diagnosis not present

## 2019-05-31 DIAGNOSIS — R262 Difficulty in walking, not elsewhere classified: Secondary | ICD-10-CM | POA: Diagnosis not present

## 2019-05-31 DIAGNOSIS — R278 Other lack of coordination: Secondary | ICD-10-CM | POA: Diagnosis not present

## 2019-05-31 DIAGNOSIS — M6281 Muscle weakness (generalized): Secondary | ICD-10-CM | POA: Diagnosis not present

## 2019-05-31 DIAGNOSIS — I69354 Hemiplegia and hemiparesis following cerebral infarction affecting left non-dominant side: Secondary | ICD-10-CM | POA: Diagnosis not present

## 2019-06-01 DIAGNOSIS — R278 Other lack of coordination: Secondary | ICD-10-CM | POA: Diagnosis not present

## 2019-06-01 DIAGNOSIS — I69354 Hemiplegia and hemiparesis following cerebral infarction affecting left non-dominant side: Secondary | ICD-10-CM | POA: Diagnosis not present

## 2019-06-01 DIAGNOSIS — M6281 Muscle weakness (generalized): Secondary | ICD-10-CM | POA: Diagnosis not present

## 2019-06-01 DIAGNOSIS — I63219 Cerebral infarction due to unspecified occlusion or stenosis of unspecified vertebral arteries: Secondary | ICD-10-CM | POA: Diagnosis not present

## 2019-06-01 DIAGNOSIS — R262 Difficulty in walking, not elsewhere classified: Secondary | ICD-10-CM | POA: Diagnosis not present

## 2019-06-02 DIAGNOSIS — R278 Other lack of coordination: Secondary | ICD-10-CM | POA: Diagnosis not present

## 2019-06-02 DIAGNOSIS — I63219 Cerebral infarction due to unspecified occlusion or stenosis of unspecified vertebral arteries: Secondary | ICD-10-CM | POA: Diagnosis not present

## 2019-06-02 DIAGNOSIS — I69354 Hemiplegia and hemiparesis following cerebral infarction affecting left non-dominant side: Secondary | ICD-10-CM | POA: Diagnosis not present

## 2019-06-02 DIAGNOSIS — M6281 Muscle weakness (generalized): Secondary | ICD-10-CM | POA: Diagnosis not present

## 2019-06-02 DIAGNOSIS — R262 Difficulty in walking, not elsewhere classified: Secondary | ICD-10-CM | POA: Diagnosis not present

## 2019-06-03 DIAGNOSIS — M6281 Muscle weakness (generalized): Secondary | ICD-10-CM | POA: Diagnosis not present

## 2019-06-03 DIAGNOSIS — R278 Other lack of coordination: Secondary | ICD-10-CM | POA: Diagnosis not present

## 2019-06-03 DIAGNOSIS — I69354 Hemiplegia and hemiparesis following cerebral infarction affecting left non-dominant side: Secondary | ICD-10-CM | POA: Diagnosis not present

## 2019-06-03 DIAGNOSIS — I63219 Cerebral infarction due to unspecified occlusion or stenosis of unspecified vertebral arteries: Secondary | ICD-10-CM | POA: Diagnosis not present

## 2019-06-03 DIAGNOSIS — R262 Difficulty in walking, not elsewhere classified: Secondary | ICD-10-CM | POA: Diagnosis not present

## 2019-06-04 DIAGNOSIS — M6281 Muscle weakness (generalized): Secondary | ICD-10-CM | POA: Diagnosis not present

## 2019-06-04 DIAGNOSIS — R278 Other lack of coordination: Secondary | ICD-10-CM | POA: Diagnosis not present

## 2019-06-04 DIAGNOSIS — R262 Difficulty in walking, not elsewhere classified: Secondary | ICD-10-CM | POA: Diagnosis not present

## 2019-06-04 DIAGNOSIS — I69354 Hemiplegia and hemiparesis following cerebral infarction affecting left non-dominant side: Secondary | ICD-10-CM | POA: Diagnosis not present

## 2019-06-04 DIAGNOSIS — I63219 Cerebral infarction due to unspecified occlusion or stenosis of unspecified vertebral arteries: Secondary | ICD-10-CM | POA: Diagnosis not present

## 2019-06-05 DIAGNOSIS — R278 Other lack of coordination: Secondary | ICD-10-CM | POA: Diagnosis not present

## 2019-06-05 DIAGNOSIS — M6281 Muscle weakness (generalized): Secondary | ICD-10-CM | POA: Diagnosis not present

## 2019-06-05 DIAGNOSIS — I63219 Cerebral infarction due to unspecified occlusion or stenosis of unspecified vertebral arteries: Secondary | ICD-10-CM | POA: Diagnosis not present

## 2019-06-05 DIAGNOSIS — R262 Difficulty in walking, not elsewhere classified: Secondary | ICD-10-CM | POA: Diagnosis not present

## 2019-06-05 DIAGNOSIS — I69354 Hemiplegia and hemiparesis following cerebral infarction affecting left non-dominant side: Secondary | ICD-10-CM | POA: Diagnosis not present

## 2019-06-07 DIAGNOSIS — M6281 Muscle weakness (generalized): Secondary | ICD-10-CM | POA: Diagnosis not present

## 2019-06-07 DIAGNOSIS — I63219 Cerebral infarction due to unspecified occlusion or stenosis of unspecified vertebral arteries: Secondary | ICD-10-CM | POA: Diagnosis not present

## 2019-06-07 DIAGNOSIS — R278 Other lack of coordination: Secondary | ICD-10-CM | POA: Diagnosis not present

## 2019-06-07 DIAGNOSIS — R262 Difficulty in walking, not elsewhere classified: Secondary | ICD-10-CM | POA: Diagnosis not present

## 2019-06-07 DIAGNOSIS — I69354 Hemiplegia and hemiparesis following cerebral infarction affecting left non-dominant side: Secondary | ICD-10-CM | POA: Diagnosis not present

## 2019-06-08 DIAGNOSIS — R262 Difficulty in walking, not elsewhere classified: Secondary | ICD-10-CM | POA: Diagnosis not present

## 2019-06-08 DIAGNOSIS — I69354 Hemiplegia and hemiparesis following cerebral infarction affecting left non-dominant side: Secondary | ICD-10-CM | POA: Diagnosis not present

## 2019-06-08 DIAGNOSIS — I63219 Cerebral infarction due to unspecified occlusion or stenosis of unspecified vertebral arteries: Secondary | ICD-10-CM | POA: Diagnosis not present

## 2019-06-08 DIAGNOSIS — R278 Other lack of coordination: Secondary | ICD-10-CM | POA: Diagnosis not present

## 2019-06-08 DIAGNOSIS — M6281 Muscle weakness (generalized): Secondary | ICD-10-CM | POA: Diagnosis not present

## 2019-06-09 DIAGNOSIS — I63219 Cerebral infarction due to unspecified occlusion or stenosis of unspecified vertebral arteries: Secondary | ICD-10-CM | POA: Diagnosis not present

## 2019-06-09 DIAGNOSIS — I69354 Hemiplegia and hemiparesis following cerebral infarction affecting left non-dominant side: Secondary | ICD-10-CM | POA: Diagnosis not present

## 2019-06-09 DIAGNOSIS — M6281 Muscle weakness (generalized): Secondary | ICD-10-CM | POA: Diagnosis not present

## 2019-06-09 DIAGNOSIS — R278 Other lack of coordination: Secondary | ICD-10-CM | POA: Diagnosis not present

## 2019-06-09 DIAGNOSIS — R262 Difficulty in walking, not elsewhere classified: Secondary | ICD-10-CM | POA: Diagnosis not present

## 2019-06-10 DIAGNOSIS — I63219 Cerebral infarction due to unspecified occlusion or stenosis of unspecified vertebral arteries: Secondary | ICD-10-CM | POA: Diagnosis not present

## 2019-06-10 DIAGNOSIS — M6281 Muscle weakness (generalized): Secondary | ICD-10-CM | POA: Diagnosis not present

## 2019-06-10 DIAGNOSIS — R262 Difficulty in walking, not elsewhere classified: Secondary | ICD-10-CM | POA: Diagnosis not present

## 2019-06-10 DIAGNOSIS — R278 Other lack of coordination: Secondary | ICD-10-CM | POA: Diagnosis not present

## 2019-06-10 DIAGNOSIS — I69354 Hemiplegia and hemiparesis following cerebral infarction affecting left non-dominant side: Secondary | ICD-10-CM | POA: Diagnosis not present

## 2019-06-11 DIAGNOSIS — R262 Difficulty in walking, not elsewhere classified: Secondary | ICD-10-CM | POA: Diagnosis not present

## 2019-06-11 DIAGNOSIS — R278 Other lack of coordination: Secondary | ICD-10-CM | POA: Diagnosis not present

## 2019-06-11 DIAGNOSIS — I63219 Cerebral infarction due to unspecified occlusion or stenosis of unspecified vertebral arteries: Secondary | ICD-10-CM | POA: Diagnosis not present

## 2019-06-11 DIAGNOSIS — I69354 Hemiplegia and hemiparesis following cerebral infarction affecting left non-dominant side: Secondary | ICD-10-CM | POA: Diagnosis not present

## 2019-06-11 DIAGNOSIS — M6281 Muscle weakness (generalized): Secondary | ICD-10-CM | POA: Diagnosis not present

## 2019-06-12 DIAGNOSIS — M6281 Muscle weakness (generalized): Secondary | ICD-10-CM | POA: Diagnosis not present

## 2019-06-12 DIAGNOSIS — I63219 Cerebral infarction due to unspecified occlusion or stenosis of unspecified vertebral arteries: Secondary | ICD-10-CM | POA: Diagnosis not present

## 2019-06-12 DIAGNOSIS — I69354 Hemiplegia and hemiparesis following cerebral infarction affecting left non-dominant side: Secondary | ICD-10-CM | POA: Diagnosis not present

## 2019-06-12 DIAGNOSIS — R278 Other lack of coordination: Secondary | ICD-10-CM | POA: Diagnosis not present

## 2019-06-12 DIAGNOSIS — R262 Difficulty in walking, not elsewhere classified: Secondary | ICD-10-CM | POA: Diagnosis not present

## 2019-06-14 DIAGNOSIS — R262 Difficulty in walking, not elsewhere classified: Secondary | ICD-10-CM | POA: Diagnosis not present

## 2019-06-14 DIAGNOSIS — I63219 Cerebral infarction due to unspecified occlusion or stenosis of unspecified vertebral arteries: Secondary | ICD-10-CM | POA: Diagnosis not present

## 2019-06-14 DIAGNOSIS — I69354 Hemiplegia and hemiparesis following cerebral infarction affecting left non-dominant side: Secondary | ICD-10-CM | POA: Diagnosis not present

## 2019-06-14 DIAGNOSIS — M6281 Muscle weakness (generalized): Secondary | ICD-10-CM | POA: Diagnosis not present

## 2019-06-14 DIAGNOSIS — R278 Other lack of coordination: Secondary | ICD-10-CM | POA: Diagnosis not present

## 2019-06-16 DIAGNOSIS — E119 Type 2 diabetes mellitus without complications: Secondary | ICD-10-CM | POA: Diagnosis not present

## 2019-06-16 DIAGNOSIS — I4891 Unspecified atrial fibrillation: Secondary | ICD-10-CM | POA: Diagnosis not present

## 2019-06-16 DIAGNOSIS — F329 Major depressive disorder, single episode, unspecified: Secondary | ICD-10-CM | POA: Diagnosis not present

## 2019-06-16 DIAGNOSIS — N401 Enlarged prostate with lower urinary tract symptoms: Secondary | ICD-10-CM | POA: Diagnosis not present

## 2019-06-16 DIAGNOSIS — Z794 Long term (current) use of insulin: Secondary | ICD-10-CM | POA: Diagnosis not present

## 2019-06-16 DIAGNOSIS — I10 Essential (primary) hypertension: Secondary | ICD-10-CM | POA: Diagnosis not present

## 2019-06-16 DIAGNOSIS — I639 Cerebral infarction, unspecified: Secondary | ICD-10-CM | POA: Diagnosis not present

## 2019-06-19 DIAGNOSIS — I63219 Cerebral infarction due to unspecified occlusion or stenosis of unspecified vertebral arteries: Secondary | ICD-10-CM | POA: Diagnosis not present

## 2019-06-19 DIAGNOSIS — I69354 Hemiplegia and hemiparesis following cerebral infarction affecting left non-dominant side: Secondary | ICD-10-CM | POA: Diagnosis not present

## 2019-06-19 DIAGNOSIS — R509 Fever, unspecified: Secondary | ICD-10-CM | POA: Diagnosis not present

## 2019-07-13 DIAGNOSIS — Z23 Encounter for immunization: Secondary | ICD-10-CM | POA: Diagnosis not present

## 2019-08-03 DIAGNOSIS — Z23 Encounter for immunization: Secondary | ICD-10-CM | POA: Diagnosis not present

## 2019-08-10 DIAGNOSIS — Z794 Long term (current) use of insulin: Secondary | ICD-10-CM | POA: Diagnosis not present

## 2019-08-10 DIAGNOSIS — Z8673 Personal history of transient ischemic attack (TIA), and cerebral infarction without residual deficits: Secondary | ICD-10-CM | POA: Diagnosis not present

## 2019-08-10 DIAGNOSIS — E119 Type 2 diabetes mellitus without complications: Secondary | ICD-10-CM | POA: Diagnosis not present

## 2019-08-10 DIAGNOSIS — G9009 Other idiopathic peripheral autonomic neuropathy: Secondary | ICD-10-CM | POA: Diagnosis not present

## 2019-08-10 DIAGNOSIS — F329 Major depressive disorder, single episode, unspecified: Secondary | ICD-10-CM | POA: Diagnosis not present

## 2019-08-10 DIAGNOSIS — I10 Essential (primary) hypertension: Secondary | ICD-10-CM | POA: Diagnosis not present

## 2019-08-18 DIAGNOSIS — R262 Difficulty in walking, not elsewhere classified: Secondary | ICD-10-CM | POA: Diagnosis not present

## 2019-08-18 DIAGNOSIS — I69354 Hemiplegia and hemiparesis following cerebral infarction affecting left non-dominant side: Secondary | ICD-10-CM | POA: Diagnosis not present

## 2019-08-18 DIAGNOSIS — M6281 Muscle weakness (generalized): Secondary | ICD-10-CM | POA: Diagnosis not present

## 2019-08-19 DIAGNOSIS — M6281 Muscle weakness (generalized): Secondary | ICD-10-CM | POA: Diagnosis not present

## 2019-08-19 DIAGNOSIS — I69354 Hemiplegia and hemiparesis following cerebral infarction affecting left non-dominant side: Secondary | ICD-10-CM | POA: Diagnosis not present

## 2019-08-19 DIAGNOSIS — R262 Difficulty in walking, not elsewhere classified: Secondary | ICD-10-CM | POA: Diagnosis not present

## 2019-08-20 DIAGNOSIS — I69354 Hemiplegia and hemiparesis following cerebral infarction affecting left non-dominant side: Secondary | ICD-10-CM | POA: Diagnosis not present

## 2019-08-20 DIAGNOSIS — M6281 Muscle weakness (generalized): Secondary | ICD-10-CM | POA: Diagnosis not present

## 2019-08-20 DIAGNOSIS — R262 Difficulty in walking, not elsewhere classified: Secondary | ICD-10-CM | POA: Diagnosis not present

## 2019-08-23 DIAGNOSIS — M6281 Muscle weakness (generalized): Secondary | ICD-10-CM | POA: Diagnosis not present

## 2019-08-23 DIAGNOSIS — I69354 Hemiplegia and hemiparesis following cerebral infarction affecting left non-dominant side: Secondary | ICD-10-CM | POA: Diagnosis not present

## 2019-08-23 DIAGNOSIS — R262 Difficulty in walking, not elsewhere classified: Secondary | ICD-10-CM | POA: Diagnosis not present

## 2019-08-24 DIAGNOSIS — I69354 Hemiplegia and hemiparesis following cerebral infarction affecting left non-dominant side: Secondary | ICD-10-CM | POA: Diagnosis not present

## 2019-08-24 DIAGNOSIS — L03031 Cellulitis of right toe: Secondary | ICD-10-CM | POA: Diagnosis not present

## 2019-08-24 DIAGNOSIS — M79674 Pain in right toe(s): Secondary | ICD-10-CM | POA: Diagnosis not present

## 2019-08-24 DIAGNOSIS — M6281 Muscle weakness (generalized): Secondary | ICD-10-CM | POA: Diagnosis not present

## 2019-08-24 DIAGNOSIS — E119 Type 2 diabetes mellitus without complications: Secondary | ICD-10-CM | POA: Diagnosis not present

## 2019-08-24 DIAGNOSIS — R262 Difficulty in walking, not elsewhere classified: Secondary | ICD-10-CM | POA: Diagnosis not present

## 2019-08-25 DIAGNOSIS — M6281 Muscle weakness (generalized): Secondary | ICD-10-CM | POA: Diagnosis not present

## 2019-08-25 DIAGNOSIS — R262 Difficulty in walking, not elsewhere classified: Secondary | ICD-10-CM | POA: Diagnosis not present

## 2019-08-25 DIAGNOSIS — I69354 Hemiplegia and hemiparesis following cerebral infarction affecting left non-dominant side: Secondary | ICD-10-CM | POA: Diagnosis not present

## 2019-08-26 DIAGNOSIS — I69354 Hemiplegia and hemiparesis following cerebral infarction affecting left non-dominant side: Secondary | ICD-10-CM | POA: Diagnosis not present

## 2019-08-26 DIAGNOSIS — R262 Difficulty in walking, not elsewhere classified: Secondary | ICD-10-CM | POA: Diagnosis not present

## 2019-08-26 DIAGNOSIS — M6281 Muscle weakness (generalized): Secondary | ICD-10-CM | POA: Diagnosis not present

## 2019-08-27 DIAGNOSIS — I69354 Hemiplegia and hemiparesis following cerebral infarction affecting left non-dominant side: Secondary | ICD-10-CM | POA: Diagnosis not present

## 2019-08-27 DIAGNOSIS — M6281 Muscle weakness (generalized): Secondary | ICD-10-CM | POA: Diagnosis not present

## 2019-08-27 DIAGNOSIS — R262 Difficulty in walking, not elsewhere classified: Secondary | ICD-10-CM | POA: Diagnosis not present

## 2019-08-30 DIAGNOSIS — R262 Difficulty in walking, not elsewhere classified: Secondary | ICD-10-CM | POA: Diagnosis not present

## 2019-08-30 DIAGNOSIS — I69354 Hemiplegia and hemiparesis following cerebral infarction affecting left non-dominant side: Secondary | ICD-10-CM | POA: Diagnosis not present

## 2019-08-30 DIAGNOSIS — M6281 Muscle weakness (generalized): Secondary | ICD-10-CM | POA: Diagnosis not present

## 2019-08-31 DIAGNOSIS — M6281 Muscle weakness (generalized): Secondary | ICD-10-CM | POA: Diagnosis not present

## 2019-08-31 DIAGNOSIS — R262 Difficulty in walking, not elsewhere classified: Secondary | ICD-10-CM | POA: Diagnosis not present

## 2019-08-31 DIAGNOSIS — I69354 Hemiplegia and hemiparesis following cerebral infarction affecting left non-dominant side: Secondary | ICD-10-CM | POA: Diagnosis not present

## 2019-09-01 DIAGNOSIS — R262 Difficulty in walking, not elsewhere classified: Secondary | ICD-10-CM | POA: Diagnosis not present

## 2019-09-01 DIAGNOSIS — I69354 Hemiplegia and hemiparesis following cerebral infarction affecting left non-dominant side: Secondary | ICD-10-CM | POA: Diagnosis not present

## 2019-09-01 DIAGNOSIS — M6281 Muscle weakness (generalized): Secondary | ICD-10-CM | POA: Diagnosis not present

## 2019-09-02 DIAGNOSIS — I69354 Hemiplegia and hemiparesis following cerebral infarction affecting left non-dominant side: Secondary | ICD-10-CM | POA: Diagnosis not present

## 2019-09-02 DIAGNOSIS — M6281 Muscle weakness (generalized): Secondary | ICD-10-CM | POA: Diagnosis not present

## 2019-09-02 DIAGNOSIS — R262 Difficulty in walking, not elsewhere classified: Secondary | ICD-10-CM | POA: Diagnosis not present

## 2019-09-03 DIAGNOSIS — R262 Difficulty in walking, not elsewhere classified: Secondary | ICD-10-CM | POA: Diagnosis not present

## 2019-09-03 DIAGNOSIS — I69354 Hemiplegia and hemiparesis following cerebral infarction affecting left non-dominant side: Secondary | ICD-10-CM | POA: Diagnosis not present

## 2019-09-03 DIAGNOSIS — M6281 Muscle weakness (generalized): Secondary | ICD-10-CM | POA: Diagnosis not present

## 2019-09-04 DIAGNOSIS — R262 Difficulty in walking, not elsewhere classified: Secondary | ICD-10-CM | POA: Diagnosis not present

## 2019-09-04 DIAGNOSIS — I69354 Hemiplegia and hemiparesis following cerebral infarction affecting left non-dominant side: Secondary | ICD-10-CM | POA: Diagnosis not present

## 2019-09-04 DIAGNOSIS — M6281 Muscle weakness (generalized): Secondary | ICD-10-CM | POA: Diagnosis not present

## 2019-09-06 DIAGNOSIS — R262 Difficulty in walking, not elsewhere classified: Secondary | ICD-10-CM | POA: Diagnosis not present

## 2019-09-06 DIAGNOSIS — I69354 Hemiplegia and hemiparesis following cerebral infarction affecting left non-dominant side: Secondary | ICD-10-CM | POA: Diagnosis not present

## 2019-09-06 DIAGNOSIS — M6281 Muscle weakness (generalized): Secondary | ICD-10-CM | POA: Diagnosis not present

## 2019-09-07 DIAGNOSIS — I69354 Hemiplegia and hemiparesis following cerebral infarction affecting left non-dominant side: Secondary | ICD-10-CM | POA: Diagnosis not present

## 2019-09-07 DIAGNOSIS — M6281 Muscle weakness (generalized): Secondary | ICD-10-CM | POA: Diagnosis not present

## 2019-09-07 DIAGNOSIS — R262 Difficulty in walking, not elsewhere classified: Secondary | ICD-10-CM | POA: Diagnosis not present

## 2019-09-08 DIAGNOSIS — I69354 Hemiplegia and hemiparesis following cerebral infarction affecting left non-dominant side: Secondary | ICD-10-CM | POA: Diagnosis not present

## 2019-09-08 DIAGNOSIS — R262 Difficulty in walking, not elsewhere classified: Secondary | ICD-10-CM | POA: Diagnosis not present

## 2019-09-08 DIAGNOSIS — M6281 Muscle weakness (generalized): Secondary | ICD-10-CM | POA: Diagnosis not present

## 2019-09-09 DIAGNOSIS — R262 Difficulty in walking, not elsewhere classified: Secondary | ICD-10-CM | POA: Diagnosis not present

## 2019-09-09 DIAGNOSIS — I69354 Hemiplegia and hemiparesis following cerebral infarction affecting left non-dominant side: Secondary | ICD-10-CM | POA: Diagnosis not present

## 2019-09-09 DIAGNOSIS — M6281 Muscle weakness (generalized): Secondary | ICD-10-CM | POA: Diagnosis not present

## 2019-09-10 DIAGNOSIS — M6281 Muscle weakness (generalized): Secondary | ICD-10-CM | POA: Diagnosis not present

## 2019-09-10 DIAGNOSIS — I69354 Hemiplegia and hemiparesis following cerebral infarction affecting left non-dominant side: Secondary | ICD-10-CM | POA: Diagnosis not present

## 2019-09-10 DIAGNOSIS — R262 Difficulty in walking, not elsewhere classified: Secondary | ICD-10-CM | POA: Diagnosis not present

## 2019-09-13 DIAGNOSIS — M6281 Muscle weakness (generalized): Secondary | ICD-10-CM | POA: Diagnosis not present

## 2019-09-13 DIAGNOSIS — I69354 Hemiplegia and hemiparesis following cerebral infarction affecting left non-dominant side: Secondary | ICD-10-CM | POA: Diagnosis not present

## 2019-09-13 DIAGNOSIS — R262 Difficulty in walking, not elsewhere classified: Secondary | ICD-10-CM | POA: Diagnosis not present

## 2019-09-14 DIAGNOSIS — M6281 Muscle weakness (generalized): Secondary | ICD-10-CM | POA: Diagnosis not present

## 2019-09-14 DIAGNOSIS — I69354 Hemiplegia and hemiparesis following cerebral infarction affecting left non-dominant side: Secondary | ICD-10-CM | POA: Diagnosis not present

## 2019-09-14 DIAGNOSIS — L03031 Cellulitis of right toe: Secondary | ICD-10-CM | POA: Diagnosis not present

## 2019-09-14 DIAGNOSIS — R262 Difficulty in walking, not elsewhere classified: Secondary | ICD-10-CM | POA: Diagnosis not present

## 2019-09-15 DIAGNOSIS — M6281 Muscle weakness (generalized): Secondary | ICD-10-CM | POA: Diagnosis not present

## 2019-09-15 DIAGNOSIS — R262 Difficulty in walking, not elsewhere classified: Secondary | ICD-10-CM | POA: Diagnosis not present

## 2019-09-15 DIAGNOSIS — R509 Fever, unspecified: Secondary | ICD-10-CM | POA: Diagnosis not present

## 2019-09-15 DIAGNOSIS — I69354 Hemiplegia and hemiparesis following cerebral infarction affecting left non-dominant side: Secondary | ICD-10-CM | POA: Diagnosis not present

## 2019-09-16 DIAGNOSIS — R262 Difficulty in walking, not elsewhere classified: Secondary | ICD-10-CM | POA: Diagnosis not present

## 2019-09-16 DIAGNOSIS — I69354 Hemiplegia and hemiparesis following cerebral infarction affecting left non-dominant side: Secondary | ICD-10-CM | POA: Diagnosis not present

## 2019-09-16 DIAGNOSIS — M6281 Muscle weakness (generalized): Secondary | ICD-10-CM | POA: Diagnosis not present

## 2019-09-17 DIAGNOSIS — N189 Chronic kidney disease, unspecified: Secondary | ICD-10-CM | POA: Diagnosis not present

## 2019-09-17 DIAGNOSIS — R262 Difficulty in walking, not elsewhere classified: Secondary | ICD-10-CM | POA: Diagnosis not present

## 2019-09-17 DIAGNOSIS — M6281 Muscle weakness (generalized): Secondary | ICD-10-CM | POA: Diagnosis not present

## 2019-09-17 DIAGNOSIS — I69354 Hemiplegia and hemiparesis following cerebral infarction affecting left non-dominant side: Secondary | ICD-10-CM | POA: Diagnosis not present

## 2019-09-17 DIAGNOSIS — N390 Urinary tract infection, site not specified: Secondary | ICD-10-CM | POA: Diagnosis not present

## 2019-09-20 DIAGNOSIS — M6281 Muscle weakness (generalized): Secondary | ICD-10-CM | POA: Diagnosis not present

## 2019-09-20 DIAGNOSIS — R262 Difficulty in walking, not elsewhere classified: Secondary | ICD-10-CM | POA: Diagnosis not present

## 2019-09-20 DIAGNOSIS — I69354 Hemiplegia and hemiparesis following cerebral infarction affecting left non-dominant side: Secondary | ICD-10-CM | POA: Diagnosis not present

## 2019-09-21 DIAGNOSIS — R262 Difficulty in walking, not elsewhere classified: Secondary | ICD-10-CM | POA: Diagnosis not present

## 2019-09-21 DIAGNOSIS — I69354 Hemiplegia and hemiparesis following cerebral infarction affecting left non-dominant side: Secondary | ICD-10-CM | POA: Diagnosis not present

## 2019-09-21 DIAGNOSIS — M6281 Muscle weakness (generalized): Secondary | ICD-10-CM | POA: Diagnosis not present

## 2019-09-22 DIAGNOSIS — I69354 Hemiplegia and hemiparesis following cerebral infarction affecting left non-dominant side: Secondary | ICD-10-CM | POA: Diagnosis not present

## 2019-09-22 DIAGNOSIS — I4891 Unspecified atrial fibrillation: Secondary | ICD-10-CM | POA: Diagnosis not present

## 2019-09-22 DIAGNOSIS — M6281 Muscle weakness (generalized): Secondary | ICD-10-CM | POA: Diagnosis not present

## 2019-09-22 DIAGNOSIS — R509 Fever, unspecified: Secondary | ICD-10-CM | POA: Diagnosis not present

## 2019-09-22 DIAGNOSIS — R262 Difficulty in walking, not elsewhere classified: Secondary | ICD-10-CM | POA: Diagnosis not present

## 2019-09-22 DIAGNOSIS — D72829 Elevated white blood cell count, unspecified: Secondary | ICD-10-CM | POA: Diagnosis not present

## 2019-09-23 DIAGNOSIS — I69354 Hemiplegia and hemiparesis following cerebral infarction affecting left non-dominant side: Secondary | ICD-10-CM | POA: Diagnosis not present

## 2019-09-23 DIAGNOSIS — R262 Difficulty in walking, not elsewhere classified: Secondary | ICD-10-CM | POA: Diagnosis not present

## 2019-09-23 DIAGNOSIS — M6281 Muscle weakness (generalized): Secondary | ICD-10-CM | POA: Diagnosis not present

## 2019-09-24 DIAGNOSIS — R262 Difficulty in walking, not elsewhere classified: Secondary | ICD-10-CM | POA: Diagnosis not present

## 2019-09-24 DIAGNOSIS — M6281 Muscle weakness (generalized): Secondary | ICD-10-CM | POA: Diagnosis not present

## 2019-09-24 DIAGNOSIS — I69354 Hemiplegia and hemiparesis following cerebral infarction affecting left non-dominant side: Secondary | ICD-10-CM | POA: Diagnosis not present

## 2019-09-27 DIAGNOSIS — R262 Difficulty in walking, not elsewhere classified: Secondary | ICD-10-CM | POA: Diagnosis not present

## 2019-09-27 DIAGNOSIS — M6281 Muscle weakness (generalized): Secondary | ICD-10-CM | POA: Diagnosis not present

## 2019-09-27 DIAGNOSIS — I69354 Hemiplegia and hemiparesis following cerebral infarction affecting left non-dominant side: Secondary | ICD-10-CM | POA: Diagnosis not present

## 2019-09-28 DIAGNOSIS — I69354 Hemiplegia and hemiparesis following cerebral infarction affecting left non-dominant side: Secondary | ICD-10-CM | POA: Diagnosis not present

## 2019-09-28 DIAGNOSIS — M6281 Muscle weakness (generalized): Secondary | ICD-10-CM | POA: Diagnosis not present

## 2019-09-28 DIAGNOSIS — R262 Difficulty in walking, not elsewhere classified: Secondary | ICD-10-CM | POA: Diagnosis not present

## 2019-09-29 DIAGNOSIS — Z794 Long term (current) use of insulin: Secondary | ICD-10-CM | POA: Diagnosis not present

## 2019-09-29 DIAGNOSIS — E1142 Type 2 diabetes mellitus with diabetic polyneuropathy: Secondary | ICD-10-CM | POA: Diagnosis not present

## 2019-09-29 DIAGNOSIS — M6281 Muscle weakness (generalized): Secondary | ICD-10-CM | POA: Diagnosis not present

## 2019-09-29 DIAGNOSIS — I69354 Hemiplegia and hemiparesis following cerebral infarction affecting left non-dominant side: Secondary | ICD-10-CM | POA: Diagnosis not present

## 2019-09-29 DIAGNOSIS — R262 Difficulty in walking, not elsewhere classified: Secondary | ICD-10-CM | POA: Diagnosis not present

## 2019-09-30 DIAGNOSIS — R262 Difficulty in walking, not elsewhere classified: Secondary | ICD-10-CM | POA: Diagnosis not present

## 2019-09-30 DIAGNOSIS — M6281 Muscle weakness (generalized): Secondary | ICD-10-CM | POA: Diagnosis not present

## 2019-09-30 DIAGNOSIS — I69354 Hemiplegia and hemiparesis following cerebral infarction affecting left non-dominant side: Secondary | ICD-10-CM | POA: Diagnosis not present

## 2019-10-01 DIAGNOSIS — R262 Difficulty in walking, not elsewhere classified: Secondary | ICD-10-CM | POA: Diagnosis not present

## 2019-10-01 DIAGNOSIS — M6281 Muscle weakness (generalized): Secondary | ICD-10-CM | POA: Diagnosis not present

## 2019-10-01 DIAGNOSIS — I69354 Hemiplegia and hemiparesis following cerebral infarction affecting left non-dominant side: Secondary | ICD-10-CM | POA: Diagnosis not present

## 2019-10-04 DIAGNOSIS — I69354 Hemiplegia and hemiparesis following cerebral infarction affecting left non-dominant side: Secondary | ICD-10-CM | POA: Diagnosis not present

## 2019-10-04 DIAGNOSIS — M6281 Muscle weakness (generalized): Secondary | ICD-10-CM | POA: Diagnosis not present

## 2019-10-04 DIAGNOSIS — R262 Difficulty in walking, not elsewhere classified: Secondary | ICD-10-CM | POA: Diagnosis not present

## 2019-10-05 DIAGNOSIS — I69354 Hemiplegia and hemiparesis following cerebral infarction affecting left non-dominant side: Secondary | ICD-10-CM | POA: Diagnosis not present

## 2019-10-05 DIAGNOSIS — R262 Difficulty in walking, not elsewhere classified: Secondary | ICD-10-CM | POA: Diagnosis not present

## 2019-10-05 DIAGNOSIS — M6281 Muscle weakness (generalized): Secondary | ICD-10-CM | POA: Diagnosis not present

## 2019-10-06 DIAGNOSIS — R262 Difficulty in walking, not elsewhere classified: Secondary | ICD-10-CM | POA: Diagnosis not present

## 2019-10-06 DIAGNOSIS — I69354 Hemiplegia and hemiparesis following cerebral infarction affecting left non-dominant side: Secondary | ICD-10-CM | POA: Diagnosis not present

## 2019-10-06 DIAGNOSIS — M6281 Muscle weakness (generalized): Secondary | ICD-10-CM | POA: Diagnosis not present

## 2019-10-07 DIAGNOSIS — R262 Difficulty in walking, not elsewhere classified: Secondary | ICD-10-CM | POA: Diagnosis not present

## 2019-10-07 DIAGNOSIS — M6281 Muscle weakness (generalized): Secondary | ICD-10-CM | POA: Diagnosis not present

## 2019-10-07 DIAGNOSIS — I69354 Hemiplegia and hemiparesis following cerebral infarction affecting left non-dominant side: Secondary | ICD-10-CM | POA: Diagnosis not present

## 2019-10-08 DIAGNOSIS — I69354 Hemiplegia and hemiparesis following cerebral infarction affecting left non-dominant side: Secondary | ICD-10-CM | POA: Diagnosis not present

## 2019-10-08 DIAGNOSIS — R262 Difficulty in walking, not elsewhere classified: Secondary | ICD-10-CM | POA: Diagnosis not present

## 2019-10-08 DIAGNOSIS — M6281 Muscle weakness (generalized): Secondary | ICD-10-CM | POA: Diagnosis not present

## 2019-10-11 DIAGNOSIS — R262 Difficulty in walking, not elsewhere classified: Secondary | ICD-10-CM | POA: Diagnosis not present

## 2019-10-11 DIAGNOSIS — I69354 Hemiplegia and hemiparesis following cerebral infarction affecting left non-dominant side: Secondary | ICD-10-CM | POA: Diagnosis not present

## 2019-10-11 DIAGNOSIS — M6281 Muscle weakness (generalized): Secondary | ICD-10-CM | POA: Diagnosis not present

## 2019-10-12 DIAGNOSIS — M6281 Muscle weakness (generalized): Secondary | ICD-10-CM | POA: Diagnosis not present

## 2019-10-12 DIAGNOSIS — R262 Difficulty in walking, not elsewhere classified: Secondary | ICD-10-CM | POA: Diagnosis not present

## 2019-10-12 DIAGNOSIS — I69354 Hemiplegia and hemiparesis following cerebral infarction affecting left non-dominant side: Secondary | ICD-10-CM | POA: Diagnosis not present

## 2019-10-13 DIAGNOSIS — R262 Difficulty in walking, not elsewhere classified: Secondary | ICD-10-CM | POA: Diagnosis not present

## 2019-10-13 DIAGNOSIS — I69354 Hemiplegia and hemiparesis following cerebral infarction affecting left non-dominant side: Secondary | ICD-10-CM | POA: Diagnosis not present

## 2019-10-13 DIAGNOSIS — M6281 Muscle weakness (generalized): Secondary | ICD-10-CM | POA: Diagnosis not present

## 2019-10-14 DIAGNOSIS — I69354 Hemiplegia and hemiparesis following cerebral infarction affecting left non-dominant side: Secondary | ICD-10-CM | POA: Diagnosis not present

## 2019-10-14 DIAGNOSIS — R262 Difficulty in walking, not elsewhere classified: Secondary | ICD-10-CM | POA: Diagnosis not present

## 2019-10-14 DIAGNOSIS — M6281 Muscle weakness (generalized): Secondary | ICD-10-CM | POA: Diagnosis not present

## 2019-10-15 DIAGNOSIS — M6281 Muscle weakness (generalized): Secondary | ICD-10-CM | POA: Diagnosis not present

## 2019-10-15 DIAGNOSIS — I69354 Hemiplegia and hemiparesis following cerebral infarction affecting left non-dominant side: Secondary | ICD-10-CM | POA: Diagnosis not present

## 2019-10-15 DIAGNOSIS — R262 Difficulty in walking, not elsewhere classified: Secondary | ICD-10-CM | POA: Diagnosis not present

## 2019-10-18 DIAGNOSIS — I69354 Hemiplegia and hemiparesis following cerebral infarction affecting left non-dominant side: Secondary | ICD-10-CM | POA: Diagnosis not present

## 2019-10-18 DIAGNOSIS — M6281 Muscle weakness (generalized): Secondary | ICD-10-CM | POA: Diagnosis not present

## 2019-10-18 DIAGNOSIS — R262 Difficulty in walking, not elsewhere classified: Secondary | ICD-10-CM | POA: Diagnosis not present

## 2019-10-19 DIAGNOSIS — M6281 Muscle weakness (generalized): Secondary | ICD-10-CM | POA: Diagnosis not present

## 2019-10-19 DIAGNOSIS — I69354 Hemiplegia and hemiparesis following cerebral infarction affecting left non-dominant side: Secondary | ICD-10-CM | POA: Diagnosis not present

## 2019-10-19 DIAGNOSIS — R262 Difficulty in walking, not elsewhere classified: Secondary | ICD-10-CM | POA: Diagnosis not present

## 2019-10-20 DIAGNOSIS — R262 Difficulty in walking, not elsewhere classified: Secondary | ICD-10-CM | POA: Diagnosis not present

## 2019-10-20 DIAGNOSIS — I69354 Hemiplegia and hemiparesis following cerebral infarction affecting left non-dominant side: Secondary | ICD-10-CM | POA: Diagnosis not present

## 2019-10-20 DIAGNOSIS — M6281 Muscle weakness (generalized): Secondary | ICD-10-CM | POA: Diagnosis not present

## 2019-10-21 DIAGNOSIS — I69354 Hemiplegia and hemiparesis following cerebral infarction affecting left non-dominant side: Secondary | ICD-10-CM | POA: Diagnosis not present

## 2019-10-21 DIAGNOSIS — R262 Difficulty in walking, not elsewhere classified: Secondary | ICD-10-CM | POA: Diagnosis not present

## 2019-10-21 DIAGNOSIS — M6281 Muscle weakness (generalized): Secondary | ICD-10-CM | POA: Diagnosis not present

## 2019-10-22 DIAGNOSIS — M6281 Muscle weakness (generalized): Secondary | ICD-10-CM | POA: Diagnosis not present

## 2019-10-22 DIAGNOSIS — I69354 Hemiplegia and hemiparesis following cerebral infarction affecting left non-dominant side: Secondary | ICD-10-CM | POA: Diagnosis not present

## 2019-10-22 DIAGNOSIS — R262 Difficulty in walking, not elsewhere classified: Secondary | ICD-10-CM | POA: Diagnosis not present

## 2019-10-23 DIAGNOSIS — M6281 Muscle weakness (generalized): Secondary | ICD-10-CM | POA: Diagnosis not present

## 2019-10-23 DIAGNOSIS — R262 Difficulty in walking, not elsewhere classified: Secondary | ICD-10-CM | POA: Diagnosis not present

## 2019-10-23 DIAGNOSIS — I69354 Hemiplegia and hemiparesis following cerebral infarction affecting left non-dominant side: Secondary | ICD-10-CM | POA: Diagnosis not present

## 2019-10-25 DIAGNOSIS — R262 Difficulty in walking, not elsewhere classified: Secondary | ICD-10-CM | POA: Diagnosis not present

## 2019-10-25 DIAGNOSIS — M6281 Muscle weakness (generalized): Secondary | ICD-10-CM | POA: Diagnosis not present

## 2019-10-25 DIAGNOSIS — I69354 Hemiplegia and hemiparesis following cerebral infarction affecting left non-dominant side: Secondary | ICD-10-CM | POA: Diagnosis not present

## 2019-10-26 DIAGNOSIS — M6281 Muscle weakness (generalized): Secondary | ICD-10-CM | POA: Diagnosis not present

## 2019-10-26 DIAGNOSIS — I69354 Hemiplegia and hemiparesis following cerebral infarction affecting left non-dominant side: Secondary | ICD-10-CM | POA: Diagnosis not present

## 2019-10-26 DIAGNOSIS — R262 Difficulty in walking, not elsewhere classified: Secondary | ICD-10-CM | POA: Diagnosis not present

## 2019-10-27 DIAGNOSIS — M6281 Muscle weakness (generalized): Secondary | ICD-10-CM | POA: Diagnosis not present

## 2019-10-27 DIAGNOSIS — I69354 Hemiplegia and hemiparesis following cerebral infarction affecting left non-dominant side: Secondary | ICD-10-CM | POA: Diagnosis not present

## 2019-10-27 DIAGNOSIS — R262 Difficulty in walking, not elsewhere classified: Secondary | ICD-10-CM | POA: Diagnosis not present

## 2019-10-28 DIAGNOSIS — R262 Difficulty in walking, not elsewhere classified: Secondary | ICD-10-CM | POA: Diagnosis not present

## 2019-10-28 DIAGNOSIS — M6281 Muscle weakness (generalized): Secondary | ICD-10-CM | POA: Diagnosis not present

## 2019-10-28 DIAGNOSIS — I69354 Hemiplegia and hemiparesis following cerebral infarction affecting left non-dominant side: Secondary | ICD-10-CM | POA: Diagnosis not present

## 2019-10-29 DIAGNOSIS — I69354 Hemiplegia and hemiparesis following cerebral infarction affecting left non-dominant side: Secondary | ICD-10-CM | POA: Diagnosis not present

## 2019-10-29 DIAGNOSIS — R262 Difficulty in walking, not elsewhere classified: Secondary | ICD-10-CM | POA: Diagnosis not present

## 2019-10-29 DIAGNOSIS — M6281 Muscle weakness (generalized): Secondary | ICD-10-CM | POA: Diagnosis not present

## 2019-11-01 DIAGNOSIS — I69354 Hemiplegia and hemiparesis following cerebral infarction affecting left non-dominant side: Secondary | ICD-10-CM | POA: Diagnosis not present

## 2019-11-01 DIAGNOSIS — R262 Difficulty in walking, not elsewhere classified: Secondary | ICD-10-CM | POA: Diagnosis not present

## 2019-11-01 DIAGNOSIS — M6281 Muscle weakness (generalized): Secondary | ICD-10-CM | POA: Diagnosis not present

## 2019-11-02 DIAGNOSIS — M6281 Muscle weakness (generalized): Secondary | ICD-10-CM | POA: Diagnosis not present

## 2019-11-02 DIAGNOSIS — I69354 Hemiplegia and hemiparesis following cerebral infarction affecting left non-dominant side: Secondary | ICD-10-CM | POA: Diagnosis not present

## 2019-11-02 DIAGNOSIS — R262 Difficulty in walking, not elsewhere classified: Secondary | ICD-10-CM | POA: Diagnosis not present

## 2019-11-03 DIAGNOSIS — M6281 Muscle weakness (generalized): Secondary | ICD-10-CM | POA: Diagnosis not present

## 2019-11-03 DIAGNOSIS — I69354 Hemiplegia and hemiparesis following cerebral infarction affecting left non-dominant side: Secondary | ICD-10-CM | POA: Diagnosis not present

## 2019-11-03 DIAGNOSIS — R262 Difficulty in walking, not elsewhere classified: Secondary | ICD-10-CM | POA: Diagnosis not present

## 2019-11-04 DIAGNOSIS — R262 Difficulty in walking, not elsewhere classified: Secondary | ICD-10-CM | POA: Diagnosis not present

## 2019-11-04 DIAGNOSIS — M6281 Muscle weakness (generalized): Secondary | ICD-10-CM | POA: Diagnosis not present

## 2019-11-04 DIAGNOSIS — I69354 Hemiplegia and hemiparesis following cerebral infarction affecting left non-dominant side: Secondary | ICD-10-CM | POA: Diagnosis not present

## 2019-11-05 DIAGNOSIS — I69354 Hemiplegia and hemiparesis following cerebral infarction affecting left non-dominant side: Secondary | ICD-10-CM | POA: Diagnosis not present

## 2019-11-05 DIAGNOSIS — M6281 Muscle weakness (generalized): Secondary | ICD-10-CM | POA: Diagnosis not present

## 2019-11-05 DIAGNOSIS — R262 Difficulty in walking, not elsewhere classified: Secondary | ICD-10-CM | POA: Diagnosis not present

## 2019-11-08 DIAGNOSIS — M6281 Muscle weakness (generalized): Secondary | ICD-10-CM | POA: Diagnosis not present

## 2019-11-08 DIAGNOSIS — I69354 Hemiplegia and hemiparesis following cerebral infarction affecting left non-dominant side: Secondary | ICD-10-CM | POA: Diagnosis not present

## 2019-11-08 DIAGNOSIS — R262 Difficulty in walking, not elsewhere classified: Secondary | ICD-10-CM | POA: Diagnosis not present

## 2019-11-09 DIAGNOSIS — M6281 Muscle weakness (generalized): Secondary | ICD-10-CM | POA: Diagnosis not present

## 2019-11-09 DIAGNOSIS — R262 Difficulty in walking, not elsewhere classified: Secondary | ICD-10-CM | POA: Diagnosis not present

## 2019-11-09 DIAGNOSIS — I69354 Hemiplegia and hemiparesis following cerebral infarction affecting left non-dominant side: Secondary | ICD-10-CM | POA: Diagnosis not present

## 2019-11-10 DIAGNOSIS — M6281 Muscle weakness (generalized): Secondary | ICD-10-CM | POA: Diagnosis not present

## 2019-11-10 DIAGNOSIS — I69354 Hemiplegia and hemiparesis following cerebral infarction affecting left non-dominant side: Secondary | ICD-10-CM | POA: Diagnosis not present

## 2019-11-10 DIAGNOSIS — R262 Difficulty in walking, not elsewhere classified: Secondary | ICD-10-CM | POA: Diagnosis not present

## 2019-11-11 DIAGNOSIS — I69354 Hemiplegia and hemiparesis following cerebral infarction affecting left non-dominant side: Secondary | ICD-10-CM | POA: Diagnosis not present

## 2019-11-11 DIAGNOSIS — R262 Difficulty in walking, not elsewhere classified: Secondary | ICD-10-CM | POA: Diagnosis not present

## 2019-11-11 DIAGNOSIS — M6281 Muscle weakness (generalized): Secondary | ICD-10-CM | POA: Diagnosis not present

## 2019-11-12 DIAGNOSIS — M6281 Muscle weakness (generalized): Secondary | ICD-10-CM | POA: Diagnosis not present

## 2019-11-12 DIAGNOSIS — I69354 Hemiplegia and hemiparesis following cerebral infarction affecting left non-dominant side: Secondary | ICD-10-CM | POA: Diagnosis not present

## 2019-11-12 DIAGNOSIS — R262 Difficulty in walking, not elsewhere classified: Secondary | ICD-10-CM | POA: Diagnosis not present

## 2019-11-14 DIAGNOSIS — M6281 Muscle weakness (generalized): Secondary | ICD-10-CM | POA: Diagnosis not present

## 2019-11-14 DIAGNOSIS — R262 Difficulty in walking, not elsewhere classified: Secondary | ICD-10-CM | POA: Diagnosis not present

## 2019-11-14 DIAGNOSIS — I69354 Hemiplegia and hemiparesis following cerebral infarction affecting left non-dominant side: Secondary | ICD-10-CM | POA: Diagnosis not present

## 2019-11-15 DIAGNOSIS — R262 Difficulty in walking, not elsewhere classified: Secondary | ICD-10-CM | POA: Diagnosis not present

## 2019-11-15 DIAGNOSIS — M6281 Muscle weakness (generalized): Secondary | ICD-10-CM | POA: Diagnosis not present

## 2019-11-15 DIAGNOSIS — I69354 Hemiplegia and hemiparesis following cerebral infarction affecting left non-dominant side: Secondary | ICD-10-CM | POA: Diagnosis not present

## 2019-11-16 DIAGNOSIS — I69354 Hemiplegia and hemiparesis following cerebral infarction affecting left non-dominant side: Secondary | ICD-10-CM | POA: Diagnosis not present

## 2019-11-16 DIAGNOSIS — F329 Major depressive disorder, single episode, unspecified: Secondary | ICD-10-CM | POA: Diagnosis not present

## 2019-11-16 DIAGNOSIS — G629 Polyneuropathy, unspecified: Secondary | ICD-10-CM | POA: Diagnosis not present

## 2019-11-16 DIAGNOSIS — Z8673 Personal history of transient ischemic attack (TIA), and cerebral infarction without residual deficits: Secondary | ICD-10-CM | POA: Diagnosis not present

## 2019-11-16 DIAGNOSIS — E119 Type 2 diabetes mellitus without complications: Secondary | ICD-10-CM | POA: Diagnosis not present

## 2019-11-16 DIAGNOSIS — M6281 Muscle weakness (generalized): Secondary | ICD-10-CM | POA: Diagnosis not present

## 2019-11-16 DIAGNOSIS — I10 Essential (primary) hypertension: Secondary | ICD-10-CM | POA: Diagnosis not present

## 2019-11-16 DIAGNOSIS — R262 Difficulty in walking, not elsewhere classified: Secondary | ICD-10-CM | POA: Diagnosis not present

## 2019-11-17 DIAGNOSIS — R262 Difficulty in walking, not elsewhere classified: Secondary | ICD-10-CM | POA: Diagnosis not present

## 2019-11-17 DIAGNOSIS — I69354 Hemiplegia and hemiparesis following cerebral infarction affecting left non-dominant side: Secondary | ICD-10-CM | POA: Diagnosis not present

## 2019-11-17 DIAGNOSIS — M6281 Muscle weakness (generalized): Secondary | ICD-10-CM | POA: Diagnosis not present

## 2019-11-18 DIAGNOSIS — M6281 Muscle weakness (generalized): Secondary | ICD-10-CM | POA: Diagnosis not present

## 2019-11-18 DIAGNOSIS — I69354 Hemiplegia and hemiparesis following cerebral infarction affecting left non-dominant side: Secondary | ICD-10-CM | POA: Diagnosis not present

## 2019-11-18 DIAGNOSIS — R262 Difficulty in walking, not elsewhere classified: Secondary | ICD-10-CM | POA: Diagnosis not present

## 2019-11-19 DIAGNOSIS — I69354 Hemiplegia and hemiparesis following cerebral infarction affecting left non-dominant side: Secondary | ICD-10-CM | POA: Diagnosis not present

## 2019-11-19 DIAGNOSIS — M6281 Muscle weakness (generalized): Secondary | ICD-10-CM | POA: Diagnosis not present

## 2019-11-19 DIAGNOSIS — R262 Difficulty in walking, not elsewhere classified: Secondary | ICD-10-CM | POA: Diagnosis not present

## 2019-11-22 DIAGNOSIS — R262 Difficulty in walking, not elsewhere classified: Secondary | ICD-10-CM | POA: Diagnosis not present

## 2019-11-22 DIAGNOSIS — M6281 Muscle weakness (generalized): Secondary | ICD-10-CM | POA: Diagnosis not present

## 2019-11-22 DIAGNOSIS — I69354 Hemiplegia and hemiparesis following cerebral infarction affecting left non-dominant side: Secondary | ICD-10-CM | POA: Diagnosis not present

## 2019-11-23 DIAGNOSIS — M6281 Muscle weakness (generalized): Secondary | ICD-10-CM | POA: Diagnosis not present

## 2019-11-23 DIAGNOSIS — R262 Difficulty in walking, not elsewhere classified: Secondary | ICD-10-CM | POA: Diagnosis not present

## 2019-11-23 DIAGNOSIS — I69354 Hemiplegia and hemiparesis following cerebral infarction affecting left non-dominant side: Secondary | ICD-10-CM | POA: Diagnosis not present

## 2019-11-24 DIAGNOSIS — G8190 Hemiplegia, unspecified affecting unspecified side: Secondary | ICD-10-CM | POA: Diagnosis not present

## 2019-11-24 DIAGNOSIS — I69354 Hemiplegia and hemiparesis following cerebral infarction affecting left non-dominant side: Secondary | ICD-10-CM | POA: Diagnosis not present

## 2019-11-24 DIAGNOSIS — M6281 Muscle weakness (generalized): Secondary | ICD-10-CM | POA: Diagnosis not present

## 2019-11-24 DIAGNOSIS — R262 Difficulty in walking, not elsewhere classified: Secondary | ICD-10-CM | POA: Diagnosis not present

## 2019-11-24 DIAGNOSIS — S86892A Other injury of other muscle(s) and tendon(s) at lower leg level, left leg, initial encounter: Secondary | ICD-10-CM | POA: Diagnosis not present

## 2019-11-24 DIAGNOSIS — I10 Essential (primary) hypertension: Secondary | ICD-10-CM | POA: Diagnosis not present

## 2019-11-25 DIAGNOSIS — I69354 Hemiplegia and hemiparesis following cerebral infarction affecting left non-dominant side: Secondary | ICD-10-CM | POA: Diagnosis not present

## 2019-11-25 DIAGNOSIS — R262 Difficulty in walking, not elsewhere classified: Secondary | ICD-10-CM | POA: Diagnosis not present

## 2019-11-25 DIAGNOSIS — M6281 Muscle weakness (generalized): Secondary | ICD-10-CM | POA: Diagnosis not present

## 2019-11-26 DIAGNOSIS — M6281 Muscle weakness (generalized): Secondary | ICD-10-CM | POA: Diagnosis not present

## 2019-11-26 DIAGNOSIS — I69354 Hemiplegia and hemiparesis following cerebral infarction affecting left non-dominant side: Secondary | ICD-10-CM | POA: Diagnosis not present

## 2019-11-26 DIAGNOSIS — R262 Difficulty in walking, not elsewhere classified: Secondary | ICD-10-CM | POA: Diagnosis not present

## 2019-11-29 DIAGNOSIS — M6281 Muscle weakness (generalized): Secondary | ICD-10-CM | POA: Diagnosis not present

## 2019-11-29 DIAGNOSIS — I69354 Hemiplegia and hemiparesis following cerebral infarction affecting left non-dominant side: Secondary | ICD-10-CM | POA: Diagnosis not present

## 2019-11-29 DIAGNOSIS — R262 Difficulty in walking, not elsewhere classified: Secondary | ICD-10-CM | POA: Diagnosis not present

## 2019-11-30 DIAGNOSIS — R262 Difficulty in walking, not elsewhere classified: Secondary | ICD-10-CM | POA: Diagnosis not present

## 2019-11-30 DIAGNOSIS — M6281 Muscle weakness (generalized): Secondary | ICD-10-CM | POA: Diagnosis not present

## 2019-11-30 DIAGNOSIS — I69354 Hemiplegia and hemiparesis following cerebral infarction affecting left non-dominant side: Secondary | ICD-10-CM | POA: Diagnosis not present

## 2019-12-01 DIAGNOSIS — M6281 Muscle weakness (generalized): Secondary | ICD-10-CM | POA: Diagnosis not present

## 2019-12-01 DIAGNOSIS — I69354 Hemiplegia and hemiparesis following cerebral infarction affecting left non-dominant side: Secondary | ICD-10-CM | POA: Diagnosis not present

## 2019-12-01 DIAGNOSIS — R262 Difficulty in walking, not elsewhere classified: Secondary | ICD-10-CM | POA: Diagnosis not present

## 2019-12-02 DIAGNOSIS — M6281 Muscle weakness (generalized): Secondary | ICD-10-CM | POA: Diagnosis not present

## 2019-12-02 DIAGNOSIS — I69354 Hemiplegia and hemiparesis following cerebral infarction affecting left non-dominant side: Secondary | ICD-10-CM | POA: Diagnosis not present

## 2019-12-02 DIAGNOSIS — R262 Difficulty in walking, not elsewhere classified: Secondary | ICD-10-CM | POA: Diagnosis not present

## 2019-12-03 DIAGNOSIS — I69354 Hemiplegia and hemiparesis following cerebral infarction affecting left non-dominant side: Secondary | ICD-10-CM | POA: Diagnosis not present

## 2019-12-03 DIAGNOSIS — M6281 Muscle weakness (generalized): Secondary | ICD-10-CM | POA: Diagnosis not present

## 2019-12-03 DIAGNOSIS — R262 Difficulty in walking, not elsewhere classified: Secondary | ICD-10-CM | POA: Diagnosis not present

## 2019-12-04 DIAGNOSIS — I69354 Hemiplegia and hemiparesis following cerebral infarction affecting left non-dominant side: Secondary | ICD-10-CM | POA: Diagnosis not present

## 2019-12-04 DIAGNOSIS — M6281 Muscle weakness (generalized): Secondary | ICD-10-CM | POA: Diagnosis not present

## 2019-12-04 DIAGNOSIS — R262 Difficulty in walking, not elsewhere classified: Secondary | ICD-10-CM | POA: Diagnosis not present

## 2019-12-06 DIAGNOSIS — M6281 Muscle weakness (generalized): Secondary | ICD-10-CM | POA: Diagnosis not present

## 2019-12-06 DIAGNOSIS — I69354 Hemiplegia and hemiparesis following cerebral infarction affecting left non-dominant side: Secondary | ICD-10-CM | POA: Diagnosis not present

## 2019-12-06 DIAGNOSIS — R262 Difficulty in walking, not elsewhere classified: Secondary | ICD-10-CM | POA: Diagnosis not present

## 2019-12-07 DIAGNOSIS — M6281 Muscle weakness (generalized): Secondary | ICD-10-CM | POA: Diagnosis not present

## 2019-12-07 DIAGNOSIS — Z5181 Encounter for therapeutic drug level monitoring: Secondary | ICD-10-CM | POA: Diagnosis not present

## 2019-12-07 DIAGNOSIS — R262 Difficulty in walking, not elsewhere classified: Secondary | ICD-10-CM | POA: Diagnosis not present

## 2019-12-07 DIAGNOSIS — Z5189 Encounter for other specified aftercare: Secondary | ICD-10-CM | POA: Diagnosis not present

## 2019-12-07 DIAGNOSIS — Z79899 Other long term (current) drug therapy: Secondary | ICD-10-CM | POA: Diagnosis not present

## 2019-12-07 DIAGNOSIS — I69354 Hemiplegia and hemiparesis following cerebral infarction affecting left non-dominant side: Secondary | ICD-10-CM | POA: Diagnosis not present

## 2019-12-08 DIAGNOSIS — I69354 Hemiplegia and hemiparesis following cerebral infarction affecting left non-dominant side: Secondary | ICD-10-CM | POA: Diagnosis not present

## 2019-12-08 DIAGNOSIS — R262 Difficulty in walking, not elsewhere classified: Secondary | ICD-10-CM | POA: Diagnosis not present

## 2019-12-08 DIAGNOSIS — M6281 Muscle weakness (generalized): Secondary | ICD-10-CM | POA: Diagnosis not present

## 2019-12-09 DIAGNOSIS — I69354 Hemiplegia and hemiparesis following cerebral infarction affecting left non-dominant side: Secondary | ICD-10-CM | POA: Diagnosis not present

## 2019-12-09 DIAGNOSIS — M6281 Muscle weakness (generalized): Secondary | ICD-10-CM | POA: Diagnosis not present

## 2019-12-09 DIAGNOSIS — R262 Difficulty in walking, not elsewhere classified: Secondary | ICD-10-CM | POA: Diagnosis not present

## 2019-12-10 DIAGNOSIS — I69354 Hemiplegia and hemiparesis following cerebral infarction affecting left non-dominant side: Secondary | ICD-10-CM | POA: Diagnosis not present

## 2019-12-10 DIAGNOSIS — R262 Difficulty in walking, not elsewhere classified: Secondary | ICD-10-CM | POA: Diagnosis not present

## 2019-12-10 DIAGNOSIS — M6281 Muscle weakness (generalized): Secondary | ICD-10-CM | POA: Diagnosis not present

## 2019-12-13 DIAGNOSIS — I69354 Hemiplegia and hemiparesis following cerebral infarction affecting left non-dominant side: Secondary | ICD-10-CM | POA: Diagnosis not present

## 2019-12-13 DIAGNOSIS — M6281 Muscle weakness (generalized): Secondary | ICD-10-CM | POA: Diagnosis not present

## 2019-12-13 DIAGNOSIS — R262 Difficulty in walking, not elsewhere classified: Secondary | ICD-10-CM | POA: Diagnosis not present

## 2019-12-17 DIAGNOSIS — F3289 Other specified depressive episodes: Secondary | ICD-10-CM | POA: Diagnosis not present

## 2020-01-11 DIAGNOSIS — G629 Polyneuropathy, unspecified: Secondary | ICD-10-CM | POA: Diagnosis not present

## 2020-01-11 DIAGNOSIS — I10 Essential (primary) hypertension: Secondary | ICD-10-CM | POA: Diagnosis not present

## 2020-01-11 DIAGNOSIS — Z794 Long term (current) use of insulin: Secondary | ICD-10-CM | POA: Diagnosis not present

## 2020-01-11 DIAGNOSIS — F329 Major depressive disorder, single episode, unspecified: Secondary | ICD-10-CM | POA: Diagnosis not present

## 2020-01-11 DIAGNOSIS — E119 Type 2 diabetes mellitus without complications: Secondary | ICD-10-CM | POA: Diagnosis not present

## 2020-01-11 DIAGNOSIS — Z8673 Personal history of transient ischemic attack (TIA), and cerebral infarction without residual deficits: Secondary | ICD-10-CM | POA: Diagnosis not present

## 2020-01-13 DIAGNOSIS — R293 Abnormal posture: Secondary | ICD-10-CM | POA: Diagnosis not present

## 2020-01-13 DIAGNOSIS — M6281 Muscle weakness (generalized): Secondary | ICD-10-CM | POA: Diagnosis not present

## 2020-01-13 DIAGNOSIS — R262 Difficulty in walking, not elsewhere classified: Secondary | ICD-10-CM | POA: Diagnosis not present

## 2020-01-13 DIAGNOSIS — I69354 Hemiplegia and hemiparesis following cerebral infarction affecting left non-dominant side: Secondary | ICD-10-CM | POA: Diagnosis not present

## 2020-01-14 DIAGNOSIS — M6281 Muscle weakness (generalized): Secondary | ICD-10-CM | POA: Diagnosis not present

## 2020-01-14 DIAGNOSIS — R262 Difficulty in walking, not elsewhere classified: Secondary | ICD-10-CM | POA: Diagnosis not present

## 2020-01-14 DIAGNOSIS — I69354 Hemiplegia and hemiparesis following cerebral infarction affecting left non-dominant side: Secondary | ICD-10-CM | POA: Diagnosis not present

## 2020-01-14 DIAGNOSIS — R293 Abnormal posture: Secondary | ICD-10-CM | POA: Diagnosis not present

## 2020-01-17 DIAGNOSIS — I69354 Hemiplegia and hemiparesis following cerebral infarction affecting left non-dominant side: Secondary | ICD-10-CM | POA: Diagnosis not present

## 2020-01-17 DIAGNOSIS — R262 Difficulty in walking, not elsewhere classified: Secondary | ICD-10-CM | POA: Diagnosis not present

## 2020-01-17 DIAGNOSIS — R293 Abnormal posture: Secondary | ICD-10-CM | POA: Diagnosis not present

## 2020-01-17 DIAGNOSIS — M6281 Muscle weakness (generalized): Secondary | ICD-10-CM | POA: Diagnosis not present

## 2020-01-18 DIAGNOSIS — R293 Abnormal posture: Secondary | ICD-10-CM | POA: Diagnosis not present

## 2020-01-18 DIAGNOSIS — M6281 Muscle weakness (generalized): Secondary | ICD-10-CM | POA: Diagnosis not present

## 2020-01-18 DIAGNOSIS — R262 Difficulty in walking, not elsewhere classified: Secondary | ICD-10-CM | POA: Diagnosis not present

## 2020-01-18 DIAGNOSIS — I69354 Hemiplegia and hemiparesis following cerebral infarction affecting left non-dominant side: Secondary | ICD-10-CM | POA: Diagnosis not present

## 2020-01-18 DIAGNOSIS — E119 Type 2 diabetes mellitus without complications: Secondary | ICD-10-CM | POA: Diagnosis not present

## 2020-01-19 DIAGNOSIS — M6281 Muscle weakness (generalized): Secondary | ICD-10-CM | POA: Diagnosis not present

## 2020-01-19 DIAGNOSIS — R262 Difficulty in walking, not elsewhere classified: Secondary | ICD-10-CM | POA: Diagnosis not present

## 2020-01-19 DIAGNOSIS — I69354 Hemiplegia and hemiparesis following cerebral infarction affecting left non-dominant side: Secondary | ICD-10-CM | POA: Diagnosis not present

## 2020-01-19 DIAGNOSIS — R293 Abnormal posture: Secondary | ICD-10-CM | POA: Diagnosis not present

## 2020-01-20 DIAGNOSIS — R293 Abnormal posture: Secondary | ICD-10-CM | POA: Diagnosis not present

## 2020-01-20 DIAGNOSIS — M6281 Muscle weakness (generalized): Secondary | ICD-10-CM | POA: Diagnosis not present

## 2020-01-20 DIAGNOSIS — R262 Difficulty in walking, not elsewhere classified: Secondary | ICD-10-CM | POA: Diagnosis not present

## 2020-01-20 DIAGNOSIS — I69354 Hemiplegia and hemiparesis following cerebral infarction affecting left non-dominant side: Secondary | ICD-10-CM | POA: Diagnosis not present

## 2020-01-21 DIAGNOSIS — R262 Difficulty in walking, not elsewhere classified: Secondary | ICD-10-CM | POA: Diagnosis not present

## 2020-01-21 DIAGNOSIS — M6281 Muscle weakness (generalized): Secondary | ICD-10-CM | POA: Diagnosis not present

## 2020-01-21 DIAGNOSIS — I69354 Hemiplegia and hemiparesis following cerebral infarction affecting left non-dominant side: Secondary | ICD-10-CM | POA: Diagnosis not present

## 2020-01-21 DIAGNOSIS — R293 Abnormal posture: Secondary | ICD-10-CM | POA: Diagnosis not present

## 2020-01-24 DIAGNOSIS — I69354 Hemiplegia and hemiparesis following cerebral infarction affecting left non-dominant side: Secondary | ICD-10-CM | POA: Diagnosis not present

## 2020-01-24 DIAGNOSIS — R293 Abnormal posture: Secondary | ICD-10-CM | POA: Diagnosis not present

## 2020-01-24 DIAGNOSIS — R262 Difficulty in walking, not elsewhere classified: Secondary | ICD-10-CM | POA: Diagnosis not present

## 2020-01-24 DIAGNOSIS — M6281 Muscle weakness (generalized): Secondary | ICD-10-CM | POA: Diagnosis not present

## 2020-01-25 DIAGNOSIS — M6281 Muscle weakness (generalized): Secondary | ICD-10-CM | POA: Diagnosis not present

## 2020-01-25 DIAGNOSIS — R293 Abnormal posture: Secondary | ICD-10-CM | POA: Diagnosis not present

## 2020-01-25 DIAGNOSIS — I69354 Hemiplegia and hemiparesis following cerebral infarction affecting left non-dominant side: Secondary | ICD-10-CM | POA: Diagnosis not present

## 2020-01-25 DIAGNOSIS — R262 Difficulty in walking, not elsewhere classified: Secondary | ICD-10-CM | POA: Diagnosis not present

## 2020-01-26 DIAGNOSIS — R293 Abnormal posture: Secondary | ICD-10-CM | POA: Diagnosis not present

## 2020-01-26 DIAGNOSIS — R262 Difficulty in walking, not elsewhere classified: Secondary | ICD-10-CM | POA: Diagnosis not present

## 2020-01-26 DIAGNOSIS — M6281 Muscle weakness (generalized): Secondary | ICD-10-CM | POA: Diagnosis not present

## 2020-01-26 DIAGNOSIS — I69354 Hemiplegia and hemiparesis following cerebral infarction affecting left non-dominant side: Secondary | ICD-10-CM | POA: Diagnosis not present

## 2020-01-27 DIAGNOSIS — M6281 Muscle weakness (generalized): Secondary | ICD-10-CM | POA: Diagnosis not present

## 2020-01-27 DIAGNOSIS — R262 Difficulty in walking, not elsewhere classified: Secondary | ICD-10-CM | POA: Diagnosis not present

## 2020-01-27 DIAGNOSIS — I69354 Hemiplegia and hemiparesis following cerebral infarction affecting left non-dominant side: Secondary | ICD-10-CM | POA: Diagnosis not present

## 2020-01-27 DIAGNOSIS — R293 Abnormal posture: Secondary | ICD-10-CM | POA: Diagnosis not present

## 2020-01-28 DIAGNOSIS — I69354 Hemiplegia and hemiparesis following cerebral infarction affecting left non-dominant side: Secondary | ICD-10-CM | POA: Diagnosis not present

## 2020-01-28 DIAGNOSIS — R262 Difficulty in walking, not elsewhere classified: Secondary | ICD-10-CM | POA: Diagnosis not present

## 2020-01-28 DIAGNOSIS — R293 Abnormal posture: Secondary | ICD-10-CM | POA: Diagnosis not present

## 2020-01-28 DIAGNOSIS — M6281 Muscle weakness (generalized): Secondary | ICD-10-CM | POA: Diagnosis not present

## 2020-01-31 DIAGNOSIS — M6281 Muscle weakness (generalized): Secondary | ICD-10-CM | POA: Diagnosis not present

## 2020-01-31 DIAGNOSIS — R293 Abnormal posture: Secondary | ICD-10-CM | POA: Diagnosis not present

## 2020-01-31 DIAGNOSIS — I69354 Hemiplegia and hemiparesis following cerebral infarction affecting left non-dominant side: Secondary | ICD-10-CM | POA: Diagnosis not present

## 2020-01-31 DIAGNOSIS — R262 Difficulty in walking, not elsewhere classified: Secondary | ICD-10-CM | POA: Diagnosis not present

## 2020-02-01 DIAGNOSIS — M6281 Muscle weakness (generalized): Secondary | ICD-10-CM | POA: Diagnosis not present

## 2020-02-01 DIAGNOSIS — I69354 Hemiplegia and hemiparesis following cerebral infarction affecting left non-dominant side: Secondary | ICD-10-CM | POA: Diagnosis not present

## 2020-02-01 DIAGNOSIS — R293 Abnormal posture: Secondary | ICD-10-CM | POA: Diagnosis not present

## 2020-02-01 DIAGNOSIS — R262 Difficulty in walking, not elsewhere classified: Secondary | ICD-10-CM | POA: Diagnosis not present

## 2020-02-02 DIAGNOSIS — M6281 Muscle weakness (generalized): Secondary | ICD-10-CM | POA: Diagnosis not present

## 2020-02-02 DIAGNOSIS — R262 Difficulty in walking, not elsewhere classified: Secondary | ICD-10-CM | POA: Diagnosis not present

## 2020-02-02 DIAGNOSIS — R293 Abnormal posture: Secondary | ICD-10-CM | POA: Diagnosis not present

## 2020-02-02 DIAGNOSIS — I69354 Hemiplegia and hemiparesis following cerebral infarction affecting left non-dominant side: Secondary | ICD-10-CM | POA: Diagnosis not present

## 2020-02-03 DIAGNOSIS — R293 Abnormal posture: Secondary | ICD-10-CM | POA: Diagnosis not present

## 2020-02-03 DIAGNOSIS — R262 Difficulty in walking, not elsewhere classified: Secondary | ICD-10-CM | POA: Diagnosis not present

## 2020-02-03 DIAGNOSIS — F3289 Other specified depressive episodes: Secondary | ICD-10-CM | POA: Diagnosis not present

## 2020-02-03 DIAGNOSIS — M6281 Muscle weakness (generalized): Secondary | ICD-10-CM | POA: Diagnosis not present

## 2020-02-03 DIAGNOSIS — I69354 Hemiplegia and hemiparesis following cerebral infarction affecting left non-dominant side: Secondary | ICD-10-CM | POA: Diagnosis not present

## 2020-02-04 DIAGNOSIS — R293 Abnormal posture: Secondary | ICD-10-CM | POA: Diagnosis not present

## 2020-02-04 DIAGNOSIS — R262 Difficulty in walking, not elsewhere classified: Secondary | ICD-10-CM | POA: Diagnosis not present

## 2020-02-04 DIAGNOSIS — I69354 Hemiplegia and hemiparesis following cerebral infarction affecting left non-dominant side: Secondary | ICD-10-CM | POA: Diagnosis not present

## 2020-02-04 DIAGNOSIS — M6281 Muscle weakness (generalized): Secondary | ICD-10-CM | POA: Diagnosis not present

## 2020-02-07 DIAGNOSIS — I69354 Hemiplegia and hemiparesis following cerebral infarction affecting left non-dominant side: Secondary | ICD-10-CM | POA: Diagnosis not present

## 2020-02-07 DIAGNOSIS — R293 Abnormal posture: Secondary | ICD-10-CM | POA: Diagnosis not present

## 2020-02-07 DIAGNOSIS — R262 Difficulty in walking, not elsewhere classified: Secondary | ICD-10-CM | POA: Diagnosis not present

## 2020-02-07 DIAGNOSIS — M6281 Muscle weakness (generalized): Secondary | ICD-10-CM | POA: Diagnosis not present

## 2020-02-08 DIAGNOSIS — R293 Abnormal posture: Secondary | ICD-10-CM | POA: Diagnosis not present

## 2020-02-08 DIAGNOSIS — R262 Difficulty in walking, not elsewhere classified: Secondary | ICD-10-CM | POA: Diagnosis not present

## 2020-02-08 DIAGNOSIS — I69354 Hemiplegia and hemiparesis following cerebral infarction affecting left non-dominant side: Secondary | ICD-10-CM | POA: Diagnosis not present

## 2020-02-08 DIAGNOSIS — M6281 Muscle weakness (generalized): Secondary | ICD-10-CM | POA: Diagnosis not present

## 2020-02-09 DIAGNOSIS — I69354 Hemiplegia and hemiparesis following cerebral infarction affecting left non-dominant side: Secondary | ICD-10-CM | POA: Diagnosis not present

## 2020-02-09 DIAGNOSIS — M6281 Muscle weakness (generalized): Secondary | ICD-10-CM | POA: Diagnosis not present

## 2020-02-09 DIAGNOSIS — R262 Difficulty in walking, not elsewhere classified: Secondary | ICD-10-CM | POA: Diagnosis not present

## 2020-02-09 DIAGNOSIS — R293 Abnormal posture: Secondary | ICD-10-CM | POA: Diagnosis not present

## 2020-02-10 DIAGNOSIS — R293 Abnormal posture: Secondary | ICD-10-CM | POA: Diagnosis not present

## 2020-02-10 DIAGNOSIS — M6281 Muscle weakness (generalized): Secondary | ICD-10-CM | POA: Diagnosis not present

## 2020-02-10 DIAGNOSIS — I69354 Hemiplegia and hemiparesis following cerebral infarction affecting left non-dominant side: Secondary | ICD-10-CM | POA: Diagnosis not present

## 2020-02-10 DIAGNOSIS — R262 Difficulty in walking, not elsewhere classified: Secondary | ICD-10-CM | POA: Diagnosis not present

## 2020-02-11 DIAGNOSIS — R293 Abnormal posture: Secondary | ICD-10-CM | POA: Diagnosis not present

## 2020-02-11 DIAGNOSIS — M6281 Muscle weakness (generalized): Secondary | ICD-10-CM | POA: Diagnosis not present

## 2020-02-11 DIAGNOSIS — I69354 Hemiplegia and hemiparesis following cerebral infarction affecting left non-dominant side: Secondary | ICD-10-CM | POA: Diagnosis not present

## 2020-02-11 DIAGNOSIS — R262 Difficulty in walking, not elsewhere classified: Secondary | ICD-10-CM | POA: Diagnosis not present

## 2020-02-14 DIAGNOSIS — I69354 Hemiplegia and hemiparesis following cerebral infarction affecting left non-dominant side: Secondary | ICD-10-CM | POA: Diagnosis not present

## 2020-02-14 DIAGNOSIS — R293 Abnormal posture: Secondary | ICD-10-CM | POA: Diagnosis not present

## 2020-02-14 DIAGNOSIS — M6281 Muscle weakness (generalized): Secondary | ICD-10-CM | POA: Diagnosis not present

## 2020-02-14 DIAGNOSIS — R262 Difficulty in walking, not elsewhere classified: Secondary | ICD-10-CM | POA: Diagnosis not present

## 2020-02-15 DIAGNOSIS — R293 Abnormal posture: Secondary | ICD-10-CM | POA: Diagnosis not present

## 2020-02-15 DIAGNOSIS — R262 Difficulty in walking, not elsewhere classified: Secondary | ICD-10-CM | POA: Diagnosis not present

## 2020-02-15 DIAGNOSIS — I69354 Hemiplegia and hemiparesis following cerebral infarction affecting left non-dominant side: Secondary | ICD-10-CM | POA: Diagnosis not present

## 2020-02-15 DIAGNOSIS — M6281 Muscle weakness (generalized): Secondary | ICD-10-CM | POA: Diagnosis not present

## 2020-02-16 DIAGNOSIS — R293 Abnormal posture: Secondary | ICD-10-CM | POA: Diagnosis not present

## 2020-02-16 DIAGNOSIS — M6281 Muscle weakness (generalized): Secondary | ICD-10-CM | POA: Diagnosis not present

## 2020-02-16 DIAGNOSIS — I69354 Hemiplegia and hemiparesis following cerebral infarction affecting left non-dominant side: Secondary | ICD-10-CM | POA: Diagnosis not present

## 2020-02-16 DIAGNOSIS — R262 Difficulty in walking, not elsewhere classified: Secondary | ICD-10-CM | POA: Diagnosis not present

## 2020-02-17 DIAGNOSIS — M6281 Muscle weakness (generalized): Secondary | ICD-10-CM | POA: Diagnosis not present

## 2020-02-17 DIAGNOSIS — R293 Abnormal posture: Secondary | ICD-10-CM | POA: Diagnosis not present

## 2020-02-17 DIAGNOSIS — I69354 Hemiplegia and hemiparesis following cerebral infarction affecting left non-dominant side: Secondary | ICD-10-CM | POA: Diagnosis not present

## 2020-02-17 DIAGNOSIS — R262 Difficulty in walking, not elsewhere classified: Secondary | ICD-10-CM | POA: Diagnosis not present

## 2020-02-18 DIAGNOSIS — R262 Difficulty in walking, not elsewhere classified: Secondary | ICD-10-CM | POA: Diagnosis not present

## 2020-02-18 DIAGNOSIS — R309 Painful micturition, unspecified: Secondary | ICD-10-CM | POA: Diagnosis not present

## 2020-02-18 DIAGNOSIS — M6281 Muscle weakness (generalized): Secondary | ICD-10-CM | POA: Diagnosis not present

## 2020-02-18 DIAGNOSIS — J111 Influenza due to unidentified influenza virus with other respiratory manifestations: Secondary | ICD-10-CM | POA: Diagnosis not present

## 2020-02-18 DIAGNOSIS — I69354 Hemiplegia and hemiparesis following cerebral infarction affecting left non-dominant side: Secondary | ICD-10-CM | POA: Diagnosis not present

## 2020-02-18 DIAGNOSIS — R293 Abnormal posture: Secondary | ICD-10-CM | POA: Diagnosis not present

## 2020-02-18 DIAGNOSIS — N390 Urinary tract infection, site not specified: Secondary | ICD-10-CM | POA: Diagnosis not present

## 2020-02-19 DIAGNOSIS — R293 Abnormal posture: Secondary | ICD-10-CM | POA: Diagnosis not present

## 2020-02-19 DIAGNOSIS — R262 Difficulty in walking, not elsewhere classified: Secondary | ICD-10-CM | POA: Diagnosis not present

## 2020-02-19 DIAGNOSIS — M6281 Muscle weakness (generalized): Secondary | ICD-10-CM | POA: Diagnosis not present

## 2020-02-19 DIAGNOSIS — I69354 Hemiplegia and hemiparesis following cerebral infarction affecting left non-dominant side: Secondary | ICD-10-CM | POA: Diagnosis not present

## 2020-02-21 DIAGNOSIS — I69354 Hemiplegia and hemiparesis following cerebral infarction affecting left non-dominant side: Secondary | ICD-10-CM | POA: Diagnosis not present

## 2020-02-21 DIAGNOSIS — R293 Abnormal posture: Secondary | ICD-10-CM | POA: Diagnosis not present

## 2020-02-21 DIAGNOSIS — R262 Difficulty in walking, not elsewhere classified: Secondary | ICD-10-CM | POA: Diagnosis not present

## 2020-02-21 DIAGNOSIS — M6281 Muscle weakness (generalized): Secondary | ICD-10-CM | POA: Diagnosis not present

## 2020-02-22 DIAGNOSIS — R262 Difficulty in walking, not elsewhere classified: Secondary | ICD-10-CM | POA: Diagnosis not present

## 2020-02-22 DIAGNOSIS — R293 Abnormal posture: Secondary | ICD-10-CM | POA: Diagnosis not present

## 2020-02-22 DIAGNOSIS — I69354 Hemiplegia and hemiparesis following cerebral infarction affecting left non-dominant side: Secondary | ICD-10-CM | POA: Diagnosis not present

## 2020-02-22 DIAGNOSIS — M6281 Muscle weakness (generalized): Secondary | ICD-10-CM | POA: Diagnosis not present

## 2020-02-23 DIAGNOSIS — M6281 Muscle weakness (generalized): Secondary | ICD-10-CM | POA: Diagnosis not present

## 2020-02-23 DIAGNOSIS — I10 Essential (primary) hypertension: Secondary | ICD-10-CM | POA: Diagnosis not present

## 2020-02-23 DIAGNOSIS — R262 Difficulty in walking, not elsewhere classified: Secondary | ICD-10-CM | POA: Diagnosis not present

## 2020-02-23 DIAGNOSIS — I69354 Hemiplegia and hemiparesis following cerebral infarction affecting left non-dominant side: Secondary | ICD-10-CM | POA: Diagnosis not present

## 2020-02-23 DIAGNOSIS — R293 Abnormal posture: Secondary | ICD-10-CM | POA: Diagnosis not present

## 2020-02-23 DIAGNOSIS — R531 Weakness: Secondary | ICD-10-CM | POA: Diagnosis not present

## 2020-02-23 DIAGNOSIS — I4891 Unspecified atrial fibrillation: Secondary | ICD-10-CM | POA: Diagnosis not present

## 2020-02-23 DIAGNOSIS — E119 Type 2 diabetes mellitus without complications: Secondary | ICD-10-CM | POA: Diagnosis not present

## 2020-02-24 DIAGNOSIS — R293 Abnormal posture: Secondary | ICD-10-CM | POA: Diagnosis not present

## 2020-02-24 DIAGNOSIS — I69354 Hemiplegia and hemiparesis following cerebral infarction affecting left non-dominant side: Secondary | ICD-10-CM | POA: Diagnosis not present

## 2020-02-24 DIAGNOSIS — R262 Difficulty in walking, not elsewhere classified: Secondary | ICD-10-CM | POA: Diagnosis not present

## 2020-02-24 DIAGNOSIS — M6281 Muscle weakness (generalized): Secondary | ICD-10-CM | POA: Diagnosis not present

## 2020-02-25 DIAGNOSIS — R293 Abnormal posture: Secondary | ICD-10-CM | POA: Diagnosis not present

## 2020-02-25 DIAGNOSIS — M6281 Muscle weakness (generalized): Secondary | ICD-10-CM | POA: Diagnosis not present

## 2020-02-25 DIAGNOSIS — R262 Difficulty in walking, not elsewhere classified: Secondary | ICD-10-CM | POA: Diagnosis not present

## 2020-02-25 DIAGNOSIS — I69354 Hemiplegia and hemiparesis following cerebral infarction affecting left non-dominant side: Secondary | ICD-10-CM | POA: Diagnosis not present

## 2020-02-28 DIAGNOSIS — I69354 Hemiplegia and hemiparesis following cerebral infarction affecting left non-dominant side: Secondary | ICD-10-CM | POA: Diagnosis not present

## 2020-02-28 DIAGNOSIS — R293 Abnormal posture: Secondary | ICD-10-CM | POA: Diagnosis not present

## 2020-02-28 DIAGNOSIS — R262 Difficulty in walking, not elsewhere classified: Secondary | ICD-10-CM | POA: Diagnosis not present

## 2020-02-28 DIAGNOSIS — M6281 Muscle weakness (generalized): Secondary | ICD-10-CM | POA: Diagnosis not present

## 2020-02-29 DIAGNOSIS — M6281 Muscle weakness (generalized): Secondary | ICD-10-CM | POA: Diagnosis not present

## 2020-02-29 DIAGNOSIS — R262 Difficulty in walking, not elsewhere classified: Secondary | ICD-10-CM | POA: Diagnosis not present

## 2020-02-29 DIAGNOSIS — I69354 Hemiplegia and hemiparesis following cerebral infarction affecting left non-dominant side: Secondary | ICD-10-CM | POA: Diagnosis not present

## 2020-02-29 DIAGNOSIS — R293 Abnormal posture: Secondary | ICD-10-CM | POA: Diagnosis not present

## 2020-03-01 DIAGNOSIS — R262 Difficulty in walking, not elsewhere classified: Secondary | ICD-10-CM | POA: Diagnosis not present

## 2020-03-01 DIAGNOSIS — F3289 Other specified depressive episodes: Secondary | ICD-10-CM | POA: Diagnosis not present

## 2020-03-01 DIAGNOSIS — I69354 Hemiplegia and hemiparesis following cerebral infarction affecting left non-dominant side: Secondary | ICD-10-CM | POA: Diagnosis not present

## 2020-03-01 DIAGNOSIS — M6281 Muscle weakness (generalized): Secondary | ICD-10-CM | POA: Diagnosis not present

## 2020-03-01 DIAGNOSIS — R293 Abnormal posture: Secondary | ICD-10-CM | POA: Diagnosis not present

## 2020-03-02 DIAGNOSIS — M6281 Muscle weakness (generalized): Secondary | ICD-10-CM | POA: Diagnosis not present

## 2020-03-02 DIAGNOSIS — R293 Abnormal posture: Secondary | ICD-10-CM | POA: Diagnosis not present

## 2020-03-02 DIAGNOSIS — I69354 Hemiplegia and hemiparesis following cerebral infarction affecting left non-dominant side: Secondary | ICD-10-CM | POA: Diagnosis not present

## 2020-03-02 DIAGNOSIS — R262 Difficulty in walking, not elsewhere classified: Secondary | ICD-10-CM | POA: Diagnosis not present

## 2020-03-03 DIAGNOSIS — I69354 Hemiplegia and hemiparesis following cerebral infarction affecting left non-dominant side: Secondary | ICD-10-CM | POA: Diagnosis not present

## 2020-03-03 DIAGNOSIS — R262 Difficulty in walking, not elsewhere classified: Secondary | ICD-10-CM | POA: Diagnosis not present

## 2020-03-03 DIAGNOSIS — R293 Abnormal posture: Secondary | ICD-10-CM | POA: Diagnosis not present

## 2020-03-03 DIAGNOSIS — M6281 Muscle weakness (generalized): Secondary | ICD-10-CM | POA: Diagnosis not present

## 2020-03-06 DIAGNOSIS — R262 Difficulty in walking, not elsewhere classified: Secondary | ICD-10-CM | POA: Diagnosis not present

## 2020-03-06 DIAGNOSIS — I69354 Hemiplegia and hemiparesis following cerebral infarction affecting left non-dominant side: Secondary | ICD-10-CM | POA: Diagnosis not present

## 2020-03-06 DIAGNOSIS — M6281 Muscle weakness (generalized): Secondary | ICD-10-CM | POA: Diagnosis not present

## 2020-03-06 DIAGNOSIS — R293 Abnormal posture: Secondary | ICD-10-CM | POA: Diagnosis not present

## 2020-03-07 DIAGNOSIS — R293 Abnormal posture: Secondary | ICD-10-CM | POA: Diagnosis not present

## 2020-03-07 DIAGNOSIS — I69354 Hemiplegia and hemiparesis following cerebral infarction affecting left non-dominant side: Secondary | ICD-10-CM | POA: Diagnosis not present

## 2020-03-07 DIAGNOSIS — M6281 Muscle weakness (generalized): Secondary | ICD-10-CM | POA: Diagnosis not present

## 2020-03-07 DIAGNOSIS — R262 Difficulty in walking, not elsewhere classified: Secondary | ICD-10-CM | POA: Diagnosis not present

## 2020-03-08 DIAGNOSIS — M6281 Muscle weakness (generalized): Secondary | ICD-10-CM | POA: Diagnosis not present

## 2020-03-08 DIAGNOSIS — I69354 Hemiplegia and hemiparesis following cerebral infarction affecting left non-dominant side: Secondary | ICD-10-CM | POA: Diagnosis not present

## 2020-03-08 DIAGNOSIS — R293 Abnormal posture: Secondary | ICD-10-CM | POA: Diagnosis not present

## 2020-03-08 DIAGNOSIS — R262 Difficulty in walking, not elsewhere classified: Secondary | ICD-10-CM | POA: Diagnosis not present

## 2020-03-09 DIAGNOSIS — I69354 Hemiplegia and hemiparesis following cerebral infarction affecting left non-dominant side: Secondary | ICD-10-CM | POA: Diagnosis not present

## 2020-03-09 DIAGNOSIS — M6281 Muscle weakness (generalized): Secondary | ICD-10-CM | POA: Diagnosis not present

## 2020-03-09 DIAGNOSIS — R262 Difficulty in walking, not elsewhere classified: Secondary | ICD-10-CM | POA: Diagnosis not present

## 2020-03-09 DIAGNOSIS — R293 Abnormal posture: Secondary | ICD-10-CM | POA: Diagnosis not present

## 2020-03-10 DIAGNOSIS — R293 Abnormal posture: Secondary | ICD-10-CM | POA: Diagnosis not present

## 2020-03-10 DIAGNOSIS — M6281 Muscle weakness (generalized): Secondary | ICD-10-CM | POA: Diagnosis not present

## 2020-03-10 DIAGNOSIS — R262 Difficulty in walking, not elsewhere classified: Secondary | ICD-10-CM | POA: Diagnosis not present

## 2020-03-10 DIAGNOSIS — I69354 Hemiplegia and hemiparesis following cerebral infarction affecting left non-dominant side: Secondary | ICD-10-CM | POA: Diagnosis not present

## 2020-03-13 DIAGNOSIS — R262 Difficulty in walking, not elsewhere classified: Secondary | ICD-10-CM | POA: Diagnosis not present

## 2020-03-13 DIAGNOSIS — R293 Abnormal posture: Secondary | ICD-10-CM | POA: Diagnosis not present

## 2020-03-13 DIAGNOSIS — M6281 Muscle weakness (generalized): Secondary | ICD-10-CM | POA: Diagnosis not present

## 2020-03-13 DIAGNOSIS — I69354 Hemiplegia and hemiparesis following cerebral infarction affecting left non-dominant side: Secondary | ICD-10-CM | POA: Diagnosis not present

## 2020-03-14 DIAGNOSIS — E119 Type 2 diabetes mellitus without complications: Secondary | ICD-10-CM | POA: Diagnosis not present

## 2020-03-14 DIAGNOSIS — I69354 Hemiplegia and hemiparesis following cerebral infarction affecting left non-dominant side: Secondary | ICD-10-CM | POA: Diagnosis not present

## 2020-03-14 DIAGNOSIS — I2510 Atherosclerotic heart disease of native coronary artery without angina pectoris: Secondary | ICD-10-CM | POA: Diagnosis not present

## 2020-03-14 DIAGNOSIS — R293 Abnormal posture: Secondary | ICD-10-CM | POA: Diagnosis not present

## 2020-03-14 DIAGNOSIS — N390 Urinary tract infection, site not specified: Secondary | ICD-10-CM | POA: Diagnosis not present

## 2020-03-14 DIAGNOSIS — R531 Weakness: Secondary | ICD-10-CM | POA: Diagnosis not present

## 2020-03-14 DIAGNOSIS — I639 Cerebral infarction, unspecified: Secondary | ICD-10-CM | POA: Diagnosis not present

## 2020-03-14 DIAGNOSIS — I4891 Unspecified atrial fibrillation: Secondary | ICD-10-CM | POA: Diagnosis not present

## 2020-03-14 DIAGNOSIS — I10 Essential (primary) hypertension: Secondary | ICD-10-CM | POA: Diagnosis not present

## 2020-03-14 DIAGNOSIS — Z794 Long term (current) use of insulin: Secondary | ICD-10-CM | POA: Diagnosis not present

## 2020-03-14 DIAGNOSIS — R262 Difficulty in walking, not elsewhere classified: Secondary | ICD-10-CM | POA: Diagnosis not present

## 2020-03-14 DIAGNOSIS — R8290 Unspecified abnormal findings in urine: Secondary | ICD-10-CM | POA: Diagnosis not present

## 2020-03-14 DIAGNOSIS — M6281 Muscle weakness (generalized): Secondary | ICD-10-CM | POA: Diagnosis not present

## 2020-03-15 DIAGNOSIS — R262 Difficulty in walking, not elsewhere classified: Secondary | ICD-10-CM | POA: Diagnosis not present

## 2020-03-15 DIAGNOSIS — I69354 Hemiplegia and hemiparesis following cerebral infarction affecting left non-dominant side: Secondary | ICD-10-CM | POA: Diagnosis not present

## 2020-03-15 DIAGNOSIS — M6281 Muscle weakness (generalized): Secondary | ICD-10-CM | POA: Diagnosis not present

## 2020-03-15 DIAGNOSIS — R293 Abnormal posture: Secondary | ICD-10-CM | POA: Diagnosis not present

## 2020-03-16 DIAGNOSIS — R293 Abnormal posture: Secondary | ICD-10-CM | POA: Diagnosis not present

## 2020-03-16 DIAGNOSIS — I69354 Hemiplegia and hemiparesis following cerebral infarction affecting left non-dominant side: Secondary | ICD-10-CM | POA: Diagnosis not present

## 2020-03-16 DIAGNOSIS — M6281 Muscle weakness (generalized): Secondary | ICD-10-CM | POA: Diagnosis not present

## 2020-03-16 DIAGNOSIS — R262 Difficulty in walking, not elsewhere classified: Secondary | ICD-10-CM | POA: Diagnosis not present

## 2020-03-17 DIAGNOSIS — M6281 Muscle weakness (generalized): Secondary | ICD-10-CM | POA: Diagnosis not present

## 2020-03-17 DIAGNOSIS — R293 Abnormal posture: Secondary | ICD-10-CM | POA: Diagnosis not present

## 2020-03-17 DIAGNOSIS — R262 Difficulty in walking, not elsewhere classified: Secondary | ICD-10-CM | POA: Diagnosis not present

## 2020-03-17 DIAGNOSIS — I69354 Hemiplegia and hemiparesis following cerebral infarction affecting left non-dominant side: Secondary | ICD-10-CM | POA: Diagnosis not present

## 2020-03-20 DIAGNOSIS — M6281 Muscle weakness (generalized): Secondary | ICD-10-CM | POA: Diagnosis not present

## 2020-03-20 DIAGNOSIS — R293 Abnormal posture: Secondary | ICD-10-CM | POA: Diagnosis not present

## 2020-03-20 DIAGNOSIS — R262 Difficulty in walking, not elsewhere classified: Secondary | ICD-10-CM | POA: Diagnosis not present

## 2020-03-20 DIAGNOSIS — I69354 Hemiplegia and hemiparesis following cerebral infarction affecting left non-dominant side: Secondary | ICD-10-CM | POA: Diagnosis not present

## 2020-03-21 DIAGNOSIS — M6281 Muscle weakness (generalized): Secondary | ICD-10-CM | POA: Diagnosis not present

## 2020-03-21 DIAGNOSIS — R262 Difficulty in walking, not elsewhere classified: Secondary | ICD-10-CM | POA: Diagnosis not present

## 2020-03-21 DIAGNOSIS — R293 Abnormal posture: Secondary | ICD-10-CM | POA: Diagnosis not present

## 2020-03-21 DIAGNOSIS — I69354 Hemiplegia and hemiparesis following cerebral infarction affecting left non-dominant side: Secondary | ICD-10-CM | POA: Diagnosis not present

## 2020-03-22 DIAGNOSIS — I69354 Hemiplegia and hemiparesis following cerebral infarction affecting left non-dominant side: Secondary | ICD-10-CM | POA: Diagnosis not present

## 2020-03-22 DIAGNOSIS — M6281 Muscle weakness (generalized): Secondary | ICD-10-CM | POA: Diagnosis not present

## 2020-03-22 DIAGNOSIS — R262 Difficulty in walking, not elsewhere classified: Secondary | ICD-10-CM | POA: Diagnosis not present

## 2020-03-22 DIAGNOSIS — R293 Abnormal posture: Secondary | ICD-10-CM | POA: Diagnosis not present

## 2020-03-23 DIAGNOSIS — F3289 Other specified depressive episodes: Secondary | ICD-10-CM | POA: Diagnosis not present

## 2020-03-23 DIAGNOSIS — R262 Difficulty in walking, not elsewhere classified: Secondary | ICD-10-CM | POA: Diagnosis not present

## 2020-03-23 DIAGNOSIS — R293 Abnormal posture: Secondary | ICD-10-CM | POA: Diagnosis not present

## 2020-03-23 DIAGNOSIS — I69354 Hemiplegia and hemiparesis following cerebral infarction affecting left non-dominant side: Secondary | ICD-10-CM | POA: Diagnosis not present

## 2020-03-23 DIAGNOSIS — M6281 Muscle weakness (generalized): Secondary | ICD-10-CM | POA: Diagnosis not present

## 2020-03-24 DIAGNOSIS — M6281 Muscle weakness (generalized): Secondary | ICD-10-CM | POA: Diagnosis not present

## 2020-03-24 DIAGNOSIS — R262 Difficulty in walking, not elsewhere classified: Secondary | ICD-10-CM | POA: Diagnosis not present

## 2020-03-24 DIAGNOSIS — I69354 Hemiplegia and hemiparesis following cerebral infarction affecting left non-dominant side: Secondary | ICD-10-CM | POA: Diagnosis not present

## 2020-03-24 DIAGNOSIS — R293 Abnormal posture: Secondary | ICD-10-CM | POA: Diagnosis not present

## 2020-03-27 DIAGNOSIS — R262 Difficulty in walking, not elsewhere classified: Secondary | ICD-10-CM | POA: Diagnosis not present

## 2020-03-27 DIAGNOSIS — M6281 Muscle weakness (generalized): Secondary | ICD-10-CM | POA: Diagnosis not present

## 2020-03-27 DIAGNOSIS — R293 Abnormal posture: Secondary | ICD-10-CM | POA: Diagnosis not present

## 2020-03-27 DIAGNOSIS — I69354 Hemiplegia and hemiparesis following cerebral infarction affecting left non-dominant side: Secondary | ICD-10-CM | POA: Diagnosis not present

## 2020-03-28 DIAGNOSIS — I69354 Hemiplegia and hemiparesis following cerebral infarction affecting left non-dominant side: Secondary | ICD-10-CM | POA: Diagnosis not present

## 2020-03-28 DIAGNOSIS — M6281 Muscle weakness (generalized): Secondary | ICD-10-CM | POA: Diagnosis not present

## 2020-03-28 DIAGNOSIS — R262 Difficulty in walking, not elsewhere classified: Secondary | ICD-10-CM | POA: Diagnosis not present

## 2020-03-28 DIAGNOSIS — R293 Abnormal posture: Secondary | ICD-10-CM | POA: Diagnosis not present

## 2020-03-29 DIAGNOSIS — R293 Abnormal posture: Secondary | ICD-10-CM | POA: Diagnosis not present

## 2020-03-29 DIAGNOSIS — I69354 Hemiplegia and hemiparesis following cerebral infarction affecting left non-dominant side: Secondary | ICD-10-CM | POA: Diagnosis not present

## 2020-03-29 DIAGNOSIS — M6281 Muscle weakness (generalized): Secondary | ICD-10-CM | POA: Diagnosis not present

## 2020-03-29 DIAGNOSIS — R262 Difficulty in walking, not elsewhere classified: Secondary | ICD-10-CM | POA: Diagnosis not present

## 2020-03-30 DIAGNOSIS — Z23 Encounter for immunization: Secondary | ICD-10-CM | POA: Diagnosis not present

## 2020-03-30 DIAGNOSIS — M6281 Muscle weakness (generalized): Secondary | ICD-10-CM | POA: Diagnosis not present

## 2020-03-30 DIAGNOSIS — I69354 Hemiplegia and hemiparesis following cerebral infarction affecting left non-dominant side: Secondary | ICD-10-CM | POA: Diagnosis not present

## 2020-03-30 DIAGNOSIS — R262 Difficulty in walking, not elsewhere classified: Secondary | ICD-10-CM | POA: Diagnosis not present

## 2020-03-30 DIAGNOSIS — R293 Abnormal posture: Secondary | ICD-10-CM | POA: Diagnosis not present

## 2020-03-31 DIAGNOSIS — M6281 Muscle weakness (generalized): Secondary | ICD-10-CM | POA: Diagnosis not present

## 2020-03-31 DIAGNOSIS — I69354 Hemiplegia and hemiparesis following cerebral infarction affecting left non-dominant side: Secondary | ICD-10-CM | POA: Diagnosis not present

## 2020-03-31 DIAGNOSIS — R293 Abnormal posture: Secondary | ICD-10-CM | POA: Diagnosis not present

## 2020-03-31 DIAGNOSIS — R262 Difficulty in walking, not elsewhere classified: Secondary | ICD-10-CM | POA: Diagnosis not present

## 2020-04-03 DIAGNOSIS — R262 Difficulty in walking, not elsewhere classified: Secondary | ICD-10-CM | POA: Diagnosis not present

## 2020-04-03 DIAGNOSIS — M6281 Muscle weakness (generalized): Secondary | ICD-10-CM | POA: Diagnosis not present

## 2020-04-03 DIAGNOSIS — I69354 Hemiplegia and hemiparesis following cerebral infarction affecting left non-dominant side: Secondary | ICD-10-CM | POA: Diagnosis not present

## 2020-04-03 DIAGNOSIS — R293 Abnormal posture: Secondary | ICD-10-CM | POA: Diagnosis not present

## 2020-04-04 DIAGNOSIS — R293 Abnormal posture: Secondary | ICD-10-CM | POA: Diagnosis not present

## 2020-04-04 DIAGNOSIS — R262 Difficulty in walking, not elsewhere classified: Secondary | ICD-10-CM | POA: Diagnosis not present

## 2020-04-04 DIAGNOSIS — M6281 Muscle weakness (generalized): Secondary | ICD-10-CM | POA: Diagnosis not present

## 2020-04-04 DIAGNOSIS — I69354 Hemiplegia and hemiparesis following cerebral infarction affecting left non-dominant side: Secondary | ICD-10-CM | POA: Diagnosis not present

## 2020-04-05 DIAGNOSIS — R262 Difficulty in walking, not elsewhere classified: Secondary | ICD-10-CM | POA: Diagnosis not present

## 2020-04-05 DIAGNOSIS — I69354 Hemiplegia and hemiparesis following cerebral infarction affecting left non-dominant side: Secondary | ICD-10-CM | POA: Diagnosis not present

## 2020-04-05 DIAGNOSIS — R293 Abnormal posture: Secondary | ICD-10-CM | POA: Diagnosis not present

## 2020-04-05 DIAGNOSIS — M6281 Muscle weakness (generalized): Secondary | ICD-10-CM | POA: Diagnosis not present

## 2020-04-06 DIAGNOSIS — R293 Abnormal posture: Secondary | ICD-10-CM | POA: Diagnosis not present

## 2020-04-06 DIAGNOSIS — R262 Difficulty in walking, not elsewhere classified: Secondary | ICD-10-CM | POA: Diagnosis not present

## 2020-04-06 DIAGNOSIS — M6281 Muscle weakness (generalized): Secondary | ICD-10-CM | POA: Diagnosis not present

## 2020-04-06 DIAGNOSIS — I69354 Hemiplegia and hemiparesis following cerebral infarction affecting left non-dominant side: Secondary | ICD-10-CM | POA: Diagnosis not present

## 2020-04-07 DIAGNOSIS — M6281 Muscle weakness (generalized): Secondary | ICD-10-CM | POA: Diagnosis not present

## 2020-04-07 DIAGNOSIS — R262 Difficulty in walking, not elsewhere classified: Secondary | ICD-10-CM | POA: Diagnosis not present

## 2020-04-07 DIAGNOSIS — I69354 Hemiplegia and hemiparesis following cerebral infarction affecting left non-dominant side: Secondary | ICD-10-CM | POA: Diagnosis not present

## 2020-04-07 DIAGNOSIS — R293 Abnormal posture: Secondary | ICD-10-CM | POA: Diagnosis not present

## 2020-04-12 DIAGNOSIS — Z794 Long term (current) use of insulin: Secondary | ICD-10-CM | POA: Diagnosis not present

## 2020-04-12 DIAGNOSIS — I679 Cerebrovascular disease, unspecified: Secondary | ICD-10-CM | POA: Diagnosis not present

## 2020-04-12 DIAGNOSIS — I2510 Atherosclerotic heart disease of native coronary artery without angina pectoris: Secondary | ICD-10-CM | POA: Diagnosis not present

## 2020-04-12 DIAGNOSIS — E119 Type 2 diabetes mellitus without complications: Secondary | ICD-10-CM | POA: Diagnosis not present

## 2020-04-12 DIAGNOSIS — G8194 Hemiplegia, unspecified affecting left nondominant side: Secondary | ICD-10-CM | POA: Diagnosis not present

## 2020-04-12 DIAGNOSIS — I739 Peripheral vascular disease, unspecified: Secondary | ICD-10-CM | POA: Diagnosis not present

## 2020-04-12 DIAGNOSIS — I10 Essential (primary) hypertension: Secondary | ICD-10-CM | POA: Diagnosis not present

## 2020-04-13 DIAGNOSIS — F3289 Other specified depressive episodes: Secondary | ICD-10-CM | POA: Diagnosis not present

## 2020-05-03 DIAGNOSIS — I2510 Atherosclerotic heart disease of native coronary artery without angina pectoris: Secondary | ICD-10-CM | POA: Diagnosis not present

## 2020-05-03 DIAGNOSIS — Z794 Long term (current) use of insulin: Secondary | ICD-10-CM | POA: Diagnosis not present

## 2020-05-03 DIAGNOSIS — I10 Essential (primary) hypertension: Secondary | ICD-10-CM | POA: Diagnosis not present

## 2020-05-03 DIAGNOSIS — B353 Tinea pedis: Secondary | ICD-10-CM | POA: Diagnosis not present

## 2020-05-03 DIAGNOSIS — I4891 Unspecified atrial fibrillation: Secondary | ICD-10-CM | POA: Diagnosis not present

## 2020-05-03 DIAGNOSIS — E119 Type 2 diabetes mellitus without complications: Secondary | ICD-10-CM | POA: Diagnosis not present

## 2020-05-05 DIAGNOSIS — Z23 Encounter for immunization: Secondary | ICD-10-CM | POA: Diagnosis not present

## 2020-05-17 DIAGNOSIS — E119 Type 2 diabetes mellitus without complications: Secondary | ICD-10-CM | POA: Diagnosis not present

## 2020-05-17 DIAGNOSIS — B356 Tinea cruris: Secondary | ICD-10-CM | POA: Diagnosis not present

## 2020-05-17 DIAGNOSIS — I4891 Unspecified atrial fibrillation: Secondary | ICD-10-CM | POA: Diagnosis not present

## 2020-05-17 DIAGNOSIS — Z794 Long term (current) use of insulin: Secondary | ICD-10-CM | POA: Diagnosis not present

## 2020-05-25 DIAGNOSIS — F3289 Other specified depressive episodes: Secondary | ICD-10-CM | POA: Diagnosis not present

## 2020-06-14 DIAGNOSIS — M6281 Muscle weakness (generalized): Secondary | ICD-10-CM | POA: Diagnosis not present

## 2020-06-14 DIAGNOSIS — R293 Abnormal posture: Secondary | ICD-10-CM | POA: Diagnosis not present

## 2020-06-14 DIAGNOSIS — R262 Difficulty in walking, not elsewhere classified: Secondary | ICD-10-CM | POA: Diagnosis not present

## 2020-06-14 DIAGNOSIS — I69354 Hemiplegia and hemiparesis following cerebral infarction affecting left non-dominant side: Secondary | ICD-10-CM | POA: Diagnosis not present

## 2020-06-15 DIAGNOSIS — R293 Abnormal posture: Secondary | ICD-10-CM | POA: Diagnosis not present

## 2020-06-15 DIAGNOSIS — I69354 Hemiplegia and hemiparesis following cerebral infarction affecting left non-dominant side: Secondary | ICD-10-CM | POA: Diagnosis not present

## 2020-06-15 DIAGNOSIS — M6281 Muscle weakness (generalized): Secondary | ICD-10-CM | POA: Diagnosis not present

## 2020-06-15 DIAGNOSIS — R262 Difficulty in walking, not elsewhere classified: Secondary | ICD-10-CM | POA: Diagnosis not present

## 2020-06-16 DIAGNOSIS — I69354 Hemiplegia and hemiparesis following cerebral infarction affecting left non-dominant side: Secondary | ICD-10-CM | POA: Diagnosis not present

## 2020-06-16 DIAGNOSIS — R262 Difficulty in walking, not elsewhere classified: Secondary | ICD-10-CM | POA: Diagnosis not present

## 2020-06-16 DIAGNOSIS — M6281 Muscle weakness (generalized): Secondary | ICD-10-CM | POA: Diagnosis not present

## 2020-06-16 DIAGNOSIS — R293 Abnormal posture: Secondary | ICD-10-CM | POA: Diagnosis not present

## 2020-06-19 DIAGNOSIS — R293 Abnormal posture: Secondary | ICD-10-CM | POA: Diagnosis not present

## 2020-06-19 DIAGNOSIS — R262 Difficulty in walking, not elsewhere classified: Secondary | ICD-10-CM | POA: Diagnosis not present

## 2020-06-19 DIAGNOSIS — M6281 Muscle weakness (generalized): Secondary | ICD-10-CM | POA: Diagnosis not present

## 2020-06-19 DIAGNOSIS — I69354 Hemiplegia and hemiparesis following cerebral infarction affecting left non-dominant side: Secondary | ICD-10-CM | POA: Diagnosis not present

## 2020-06-20 DIAGNOSIS — R262 Difficulty in walking, not elsewhere classified: Secondary | ICD-10-CM | POA: Diagnosis not present

## 2020-06-20 DIAGNOSIS — M6281 Muscle weakness (generalized): Secondary | ICD-10-CM | POA: Diagnosis not present

## 2020-06-20 DIAGNOSIS — I69354 Hemiplegia and hemiparesis following cerebral infarction affecting left non-dominant side: Secondary | ICD-10-CM | POA: Diagnosis not present

## 2020-06-20 DIAGNOSIS — R293 Abnormal posture: Secondary | ICD-10-CM | POA: Diagnosis not present

## 2020-06-21 DIAGNOSIS — R293 Abnormal posture: Secondary | ICD-10-CM | POA: Diagnosis not present

## 2020-06-21 DIAGNOSIS — R262 Difficulty in walking, not elsewhere classified: Secondary | ICD-10-CM | POA: Diagnosis not present

## 2020-06-21 DIAGNOSIS — M6281 Muscle weakness (generalized): Secondary | ICD-10-CM | POA: Diagnosis not present

## 2020-06-21 DIAGNOSIS — I69354 Hemiplegia and hemiparesis following cerebral infarction affecting left non-dominant side: Secondary | ICD-10-CM | POA: Diagnosis not present

## 2020-06-22 DIAGNOSIS — R262 Difficulty in walking, not elsewhere classified: Secondary | ICD-10-CM | POA: Diagnosis not present

## 2020-06-22 DIAGNOSIS — M6281 Muscle weakness (generalized): Secondary | ICD-10-CM | POA: Diagnosis not present

## 2020-06-22 DIAGNOSIS — I69354 Hemiplegia and hemiparesis following cerebral infarction affecting left non-dominant side: Secondary | ICD-10-CM | POA: Diagnosis not present

## 2020-06-22 DIAGNOSIS — R293 Abnormal posture: Secondary | ICD-10-CM | POA: Diagnosis not present

## 2020-06-23 DIAGNOSIS — R262 Difficulty in walking, not elsewhere classified: Secondary | ICD-10-CM | POA: Diagnosis not present

## 2020-06-23 DIAGNOSIS — M6281 Muscle weakness (generalized): Secondary | ICD-10-CM | POA: Diagnosis not present

## 2020-06-23 DIAGNOSIS — I69354 Hemiplegia and hemiparesis following cerebral infarction affecting left non-dominant side: Secondary | ICD-10-CM | POA: Diagnosis not present

## 2020-06-23 DIAGNOSIS — R293 Abnormal posture: Secondary | ICD-10-CM | POA: Diagnosis not present

## 2020-06-26 DIAGNOSIS — I69354 Hemiplegia and hemiparesis following cerebral infarction affecting left non-dominant side: Secondary | ICD-10-CM | POA: Diagnosis not present

## 2020-06-26 DIAGNOSIS — R262 Difficulty in walking, not elsewhere classified: Secondary | ICD-10-CM | POA: Diagnosis not present

## 2020-06-26 DIAGNOSIS — M6281 Muscle weakness (generalized): Secondary | ICD-10-CM | POA: Diagnosis not present

## 2020-06-26 DIAGNOSIS — R293 Abnormal posture: Secondary | ICD-10-CM | POA: Diagnosis not present

## 2020-06-27 DIAGNOSIS — R262 Difficulty in walking, not elsewhere classified: Secondary | ICD-10-CM | POA: Diagnosis not present

## 2020-06-27 DIAGNOSIS — I69354 Hemiplegia and hemiparesis following cerebral infarction affecting left non-dominant side: Secondary | ICD-10-CM | POA: Diagnosis not present

## 2020-06-27 DIAGNOSIS — R293 Abnormal posture: Secondary | ICD-10-CM | POA: Diagnosis not present

## 2020-06-27 DIAGNOSIS — M6281 Muscle weakness (generalized): Secondary | ICD-10-CM | POA: Diagnosis not present

## 2020-06-28 DIAGNOSIS — Z794 Long term (current) use of insulin: Secondary | ICD-10-CM | POA: Diagnosis not present

## 2020-06-28 DIAGNOSIS — I6789 Other cerebrovascular disease: Secondary | ICD-10-CM | POA: Diagnosis not present

## 2020-06-28 DIAGNOSIS — E119 Type 2 diabetes mellitus without complications: Secondary | ICD-10-CM | POA: Diagnosis not present

## 2020-06-28 DIAGNOSIS — G8194 Hemiplegia, unspecified affecting left nondominant side: Secondary | ICD-10-CM | POA: Diagnosis not present

## 2020-06-28 DIAGNOSIS — I10 Essential (primary) hypertension: Secondary | ICD-10-CM | POA: Diagnosis not present

## 2020-06-28 DIAGNOSIS — I4891 Unspecified atrial fibrillation: Secondary | ICD-10-CM | POA: Diagnosis not present

## 2020-06-29 DIAGNOSIS — R262 Difficulty in walking, not elsewhere classified: Secondary | ICD-10-CM | POA: Diagnosis not present

## 2020-06-29 DIAGNOSIS — M6281 Muscle weakness (generalized): Secondary | ICD-10-CM | POA: Diagnosis not present

## 2020-06-29 DIAGNOSIS — I69354 Hemiplegia and hemiparesis following cerebral infarction affecting left non-dominant side: Secondary | ICD-10-CM | POA: Diagnosis not present

## 2020-06-29 DIAGNOSIS — R293 Abnormal posture: Secondary | ICD-10-CM | POA: Diagnosis not present

## 2020-06-30 DIAGNOSIS — I69354 Hemiplegia and hemiparesis following cerebral infarction affecting left non-dominant side: Secondary | ICD-10-CM | POA: Diagnosis not present

## 2020-06-30 DIAGNOSIS — R293 Abnormal posture: Secondary | ICD-10-CM | POA: Diagnosis not present

## 2020-06-30 DIAGNOSIS — R262 Difficulty in walking, not elsewhere classified: Secondary | ICD-10-CM | POA: Diagnosis not present

## 2020-06-30 DIAGNOSIS — M6281 Muscle weakness (generalized): Secondary | ICD-10-CM | POA: Diagnosis not present

## 2020-07-02 DIAGNOSIS — R293 Abnormal posture: Secondary | ICD-10-CM | POA: Diagnosis not present

## 2020-07-02 DIAGNOSIS — R262 Difficulty in walking, not elsewhere classified: Secondary | ICD-10-CM | POA: Diagnosis not present

## 2020-07-02 DIAGNOSIS — M6281 Muscle weakness (generalized): Secondary | ICD-10-CM | POA: Diagnosis not present

## 2020-07-02 DIAGNOSIS — I69354 Hemiplegia and hemiparesis following cerebral infarction affecting left non-dominant side: Secondary | ICD-10-CM | POA: Diagnosis not present

## 2020-07-03 DIAGNOSIS — I69354 Hemiplegia and hemiparesis following cerebral infarction affecting left non-dominant side: Secondary | ICD-10-CM | POA: Diagnosis not present

## 2020-07-03 DIAGNOSIS — M6281 Muscle weakness (generalized): Secondary | ICD-10-CM | POA: Diagnosis not present

## 2020-07-03 DIAGNOSIS — R293 Abnormal posture: Secondary | ICD-10-CM | POA: Diagnosis not present

## 2020-07-03 DIAGNOSIS — R262 Difficulty in walking, not elsewhere classified: Secondary | ICD-10-CM | POA: Diagnosis not present

## 2020-07-04 DIAGNOSIS — M6281 Muscle weakness (generalized): Secondary | ICD-10-CM | POA: Diagnosis not present

## 2020-07-04 DIAGNOSIS — I69354 Hemiplegia and hemiparesis following cerebral infarction affecting left non-dominant side: Secondary | ICD-10-CM | POA: Diagnosis not present

## 2020-07-04 DIAGNOSIS — R293 Abnormal posture: Secondary | ICD-10-CM | POA: Diagnosis not present

## 2020-07-04 DIAGNOSIS — R262 Difficulty in walking, not elsewhere classified: Secondary | ICD-10-CM | POA: Diagnosis not present

## 2020-07-05 DIAGNOSIS — R293 Abnormal posture: Secondary | ICD-10-CM | POA: Diagnosis not present

## 2020-07-05 DIAGNOSIS — A419 Sepsis, unspecified organism: Secondary | ICD-10-CM | POA: Diagnosis not present

## 2020-07-05 DIAGNOSIS — R278 Other lack of coordination: Secondary | ICD-10-CM | POA: Diagnosis not present

## 2020-07-05 DIAGNOSIS — G8194 Hemiplegia, unspecified affecting left nondominant side: Secondary | ICD-10-CM | POA: Diagnosis not present

## 2020-07-05 DIAGNOSIS — R778 Other specified abnormalities of plasma proteins: Secondary | ICD-10-CM | POA: Diagnosis not present

## 2020-07-05 DIAGNOSIS — Z951 Presence of aortocoronary bypass graft: Secondary | ICD-10-CM | POA: Diagnosis not present

## 2020-07-05 DIAGNOSIS — R262 Difficulty in walking, not elsewhere classified: Secondary | ICD-10-CM | POA: Diagnosis not present

## 2020-07-05 DIAGNOSIS — R5383 Other fatigue: Secondary | ICD-10-CM | POA: Diagnosis not present

## 2020-07-05 DIAGNOSIS — N401 Enlarged prostate with lower urinary tract symptoms: Secondary | ICD-10-CM | POA: Diagnosis not present

## 2020-07-05 DIAGNOSIS — Z7901 Long term (current) use of anticoagulants: Secondary | ICD-10-CM | POA: Diagnosis not present

## 2020-07-05 DIAGNOSIS — I21A1 Myocardial infarction type 2: Secondary | ICD-10-CM | POA: Diagnosis not present

## 2020-07-05 DIAGNOSIS — N3001 Acute cystitis with hematuria: Secondary | ICD-10-CM | POA: Diagnosis not present

## 2020-07-05 DIAGNOSIS — Z7982 Long term (current) use of aspirin: Secondary | ICD-10-CM | POA: Diagnosis not present

## 2020-07-05 DIAGNOSIS — N319 Neuromuscular dysfunction of bladder, unspecified: Secondary | ICD-10-CM | POA: Diagnosis not present

## 2020-07-05 DIAGNOSIS — R509 Fever, unspecified: Secondary | ICD-10-CM | POA: Diagnosis not present

## 2020-07-05 DIAGNOSIS — I4811 Longstanding persistent atrial fibrillation: Secondary | ICD-10-CM | POA: Diagnosis not present

## 2020-07-05 DIAGNOSIS — Z20822 Contact with and (suspected) exposure to covid-19: Secondary | ICD-10-CM | POA: Diagnosis not present

## 2020-07-05 DIAGNOSIS — N3000 Acute cystitis without hematuria: Secondary | ICD-10-CM | POA: Diagnosis not present

## 2020-07-05 DIAGNOSIS — I4819 Other persistent atrial fibrillation: Secondary | ICD-10-CM | POA: Diagnosis not present

## 2020-07-05 DIAGNOSIS — I2510 Atherosclerotic heart disease of native coronary artery without angina pectoris: Secondary | ICD-10-CM | POA: Diagnosis not present

## 2020-07-05 DIAGNOSIS — R918 Other nonspecific abnormal finding of lung field: Secondary | ICD-10-CM | POA: Diagnosis not present

## 2020-07-05 DIAGNOSIS — N320 Bladder-neck obstruction: Secondary | ICD-10-CM | POA: Diagnosis present

## 2020-07-05 DIAGNOSIS — E0840 Diabetes mellitus due to underlying condition with diabetic neuropathy, unspecified: Secondary | ICD-10-CM | POA: Diagnosis not present

## 2020-07-05 DIAGNOSIS — K219 Gastro-esophageal reflux disease without esophagitis: Secondary | ICD-10-CM | POA: Diagnosis present

## 2020-07-05 DIAGNOSIS — E119 Type 2 diabetes mellitus without complications: Secondary | ICD-10-CM | POA: Diagnosis not present

## 2020-07-05 DIAGNOSIS — E785 Hyperlipidemia, unspecified: Secondary | ICD-10-CM | POA: Diagnosis not present

## 2020-07-05 DIAGNOSIS — I272 Pulmonary hypertension, unspecified: Secondary | ICD-10-CM | POA: Diagnosis not present

## 2020-07-05 DIAGNOSIS — I10 Essential (primary) hypertension: Secondary | ICD-10-CM | POA: Diagnosis present

## 2020-07-05 DIAGNOSIS — Z8546 Personal history of malignant neoplasm of prostate: Secondary | ICD-10-CM | POA: Diagnosis not present

## 2020-07-05 DIAGNOSIS — K8020 Calculus of gallbladder without cholecystitis without obstruction: Secondary | ICD-10-CM | POA: Diagnosis present

## 2020-07-05 DIAGNOSIS — Z66 Do not resuscitate: Secondary | ICD-10-CM | POA: Diagnosis not present

## 2020-07-05 DIAGNOSIS — I4891 Unspecified atrial fibrillation: Secondary | ICD-10-CM | POA: Diagnosis present

## 2020-07-05 DIAGNOSIS — M6281 Muscle weakness (generalized): Secondary | ICD-10-CM | POA: Diagnosis not present

## 2020-07-05 DIAGNOSIS — Z794 Long term (current) use of insulin: Secondary | ICD-10-CM | POA: Diagnosis not present

## 2020-07-05 DIAGNOSIS — R0902 Hypoxemia: Secondary | ICD-10-CM | POA: Diagnosis not present

## 2020-07-05 DIAGNOSIS — N400 Enlarged prostate without lower urinary tract symptoms: Secondary | ICD-10-CM | POA: Diagnosis present

## 2020-07-05 DIAGNOSIS — R112 Nausea with vomiting, unspecified: Secondary | ICD-10-CM | POA: Diagnosis not present

## 2020-07-05 DIAGNOSIS — N390 Urinary tract infection, site not specified: Secondary | ICD-10-CM | POA: Diagnosis not present

## 2020-07-05 DIAGNOSIS — Z743 Need for continuous supervision: Secondary | ICD-10-CM | POA: Diagnosis not present

## 2020-07-05 DIAGNOSIS — I25119 Atherosclerotic heart disease of native coronary artery with unspecified angina pectoris: Secondary | ICD-10-CM | POA: Diagnosis present

## 2020-07-05 DIAGNOSIS — R6520 Severe sepsis without septic shock: Secondary | ICD-10-CM | POA: Diagnosis present

## 2020-07-05 DIAGNOSIS — N1330 Unspecified hydronephrosis: Secondary | ICD-10-CM | POA: Diagnosis not present

## 2020-07-05 DIAGNOSIS — Z7902 Long term (current) use of antithrombotics/antiplatelets: Secondary | ICD-10-CM | POA: Diagnosis not present

## 2020-07-05 DIAGNOSIS — G9341 Metabolic encephalopathy: Secondary | ICD-10-CM | POA: Diagnosis not present

## 2020-07-05 DIAGNOSIS — N136 Pyonephrosis: Secondary | ICD-10-CM | POA: Diagnosis not present

## 2020-07-05 DIAGNOSIS — R400 Somnolence: Secondary | ICD-10-CM | POA: Diagnosis present

## 2020-07-05 DIAGNOSIS — I480 Paroxysmal atrial fibrillation: Secondary | ICD-10-CM | POA: Diagnosis not present

## 2020-07-05 DIAGNOSIS — F339 Major depressive disorder, recurrent, unspecified: Secondary | ICD-10-CM | POA: Diagnosis not present

## 2020-07-05 DIAGNOSIS — K5900 Constipation, unspecified: Secondary | ICD-10-CM | POA: Diagnosis not present

## 2020-07-05 DIAGNOSIS — N179 Acute kidney failure, unspecified: Secondary | ICD-10-CM | POA: Diagnosis not present

## 2020-07-05 DIAGNOSIS — I071 Rheumatic tricuspid insufficiency: Secondary | ICD-10-CM | POA: Diagnosis not present

## 2020-07-05 DIAGNOSIS — Z79899 Other long term (current) drug therapy: Secondary | ICD-10-CM | POA: Diagnosis not present

## 2020-07-05 DIAGNOSIS — I69354 Hemiplegia and hemiparesis following cerebral infarction affecting left non-dominant side: Secondary | ICD-10-CM | POA: Diagnosis not present

## 2020-07-05 DIAGNOSIS — J189 Pneumonia, unspecified organism: Secondary | ICD-10-CM | POA: Diagnosis not present

## 2020-07-05 DIAGNOSIS — I25709 Atherosclerosis of coronary artery bypass graft(s), unspecified, with unspecified angina pectoris: Secondary | ICD-10-CM | POA: Diagnosis not present

## 2020-07-05 DIAGNOSIS — E1142 Type 2 diabetes mellitus with diabetic polyneuropathy: Secondary | ICD-10-CM | POA: Diagnosis present

## 2020-07-05 DIAGNOSIS — I714 Abdominal aortic aneurysm, without rupture: Secondary | ICD-10-CM | POA: Diagnosis present

## 2020-07-10 DIAGNOSIS — R6520 Severe sepsis without septic shock: Secondary | ICD-10-CM | POA: Diagnosis not present

## 2020-07-10 DIAGNOSIS — I25709 Atherosclerosis of coronary artery bypass graft(s), unspecified, with unspecified angina pectoris: Secondary | ICD-10-CM | POA: Diagnosis not present

## 2020-07-10 DIAGNOSIS — R293 Abnormal posture: Secondary | ICD-10-CM | POA: Diagnosis not present

## 2020-07-10 DIAGNOSIS — Z794 Long term (current) use of insulin: Secondary | ICD-10-CM | POA: Diagnosis not present

## 2020-07-10 DIAGNOSIS — K5900 Constipation, unspecified: Secondary | ICD-10-CM | POA: Diagnosis not present

## 2020-07-10 DIAGNOSIS — M6281 Muscle weakness (generalized): Secondary | ICD-10-CM | POA: Diagnosis not present

## 2020-07-10 DIAGNOSIS — I272 Pulmonary hypertension, unspecified: Secondary | ICD-10-CM | POA: Diagnosis not present

## 2020-07-10 DIAGNOSIS — Z951 Presence of aortocoronary bypass graft: Secondary | ICD-10-CM | POA: Diagnosis not present

## 2020-07-10 DIAGNOSIS — R262 Difficulty in walking, not elsewhere classified: Secondary | ICD-10-CM | POA: Diagnosis not present

## 2020-07-10 DIAGNOSIS — R278 Other lack of coordination: Secondary | ICD-10-CM | POA: Diagnosis not present

## 2020-07-10 DIAGNOSIS — N401 Enlarged prostate with lower urinary tract symptoms: Secondary | ICD-10-CM | POA: Diagnosis not present

## 2020-07-10 DIAGNOSIS — E119 Type 2 diabetes mellitus without complications: Secondary | ICD-10-CM | POA: Diagnosis not present

## 2020-07-10 DIAGNOSIS — I10 Essential (primary) hypertension: Secondary | ICD-10-CM | POA: Diagnosis not present

## 2020-07-10 DIAGNOSIS — A419 Sepsis, unspecified organism: Secondary | ICD-10-CM | POA: Diagnosis not present

## 2020-07-10 DIAGNOSIS — R778 Other specified abnormalities of plasma proteins: Secondary | ICD-10-CM | POA: Diagnosis not present

## 2020-07-10 DIAGNOSIS — I071 Rheumatic tricuspid insufficiency: Secondary | ICD-10-CM | POA: Diagnosis not present

## 2020-07-10 DIAGNOSIS — I2510 Atherosclerotic heart disease of native coronary artery without angina pectoris: Secondary | ICD-10-CM | POA: Diagnosis not present

## 2020-07-10 DIAGNOSIS — N390 Urinary tract infection, site not specified: Secondary | ICD-10-CM | POA: Diagnosis not present

## 2020-07-10 DIAGNOSIS — I69354 Hemiplegia and hemiparesis following cerebral infarction affecting left non-dominant side: Secondary | ICD-10-CM | POA: Diagnosis not present

## 2020-07-10 DIAGNOSIS — I4891 Unspecified atrial fibrillation: Secondary | ICD-10-CM | POA: Diagnosis not present

## 2020-07-10 DIAGNOSIS — N1330 Unspecified hydronephrosis: Secondary | ICD-10-CM | POA: Diagnosis not present

## 2020-07-10 DIAGNOSIS — Z8546 Personal history of malignant neoplasm of prostate: Secondary | ICD-10-CM | POA: Diagnosis not present

## 2020-07-10 DIAGNOSIS — E0840 Diabetes mellitus due to underlying condition with diabetic neuropathy, unspecified: Secondary | ICD-10-CM | POA: Diagnosis not present

## 2020-07-10 DIAGNOSIS — K219 Gastro-esophageal reflux disease without esophagitis: Secondary | ICD-10-CM | POA: Diagnosis not present

## 2020-07-10 DIAGNOSIS — E785 Hyperlipidemia, unspecified: Secondary | ICD-10-CM | POA: Diagnosis not present

## 2020-07-10 DIAGNOSIS — N319 Neuromuscular dysfunction of bladder, unspecified: Secondary | ICD-10-CM | POA: Diagnosis not present

## 2020-07-10 DIAGNOSIS — I714 Abdominal aortic aneurysm, without rupture: Secondary | ICD-10-CM | POA: Diagnosis not present

## 2020-07-10 DIAGNOSIS — N179 Acute kidney failure, unspecified: Secondary | ICD-10-CM | POA: Diagnosis not present

## 2020-07-10 DIAGNOSIS — Z743 Need for continuous supervision: Secondary | ICD-10-CM | POA: Diagnosis not present

## 2020-07-10 DIAGNOSIS — F339 Major depressive disorder, recurrent, unspecified: Secondary | ICD-10-CM | POA: Diagnosis not present

## 2020-07-10 DIAGNOSIS — I480 Paroxysmal atrial fibrillation: Secondary | ICD-10-CM | POA: Diagnosis not present

## 2020-07-10 DIAGNOSIS — N3001 Acute cystitis with hematuria: Secondary | ICD-10-CM | POA: Diagnosis not present

## 2020-09-03 IMAGING — CT CT ANGIOGRAPHY NECK
2 of 7 series · 8 of 33 positions shown · IV contrast (APPLIED)
Comparison: Head CT without contrast 0149 hours today. MRI and
intracranial MRA 07/13/2018.

CLINICAL DATA: Code stroke.  83-year-old male.

EXAM:
CT ANGIOGRAPHY HEAD AND NECK
TECHNIQUE: Multidetector CT imaging of the head and neck was performed using
the standard protocol during bolus administration of intravenous
contrast. Multiplanar CT image reconstructions and MIPs were
obtained to evaluate the vascular anatomy. Carotid stenosis
measurements (when applicable) are obtained utilizing NASCET
criteria, using the distal internal carotid diameter as the
denominator.
CONTRAST:  60mL OMNIPAQUE IOHEXOL 350 MG/ML SOLN

[Series 5: cta neck/head · axial · 0.48mm/px · z∈[-193,-75]mm · 2 of 177 slices shown]
[im 59/177  soft-tissue]
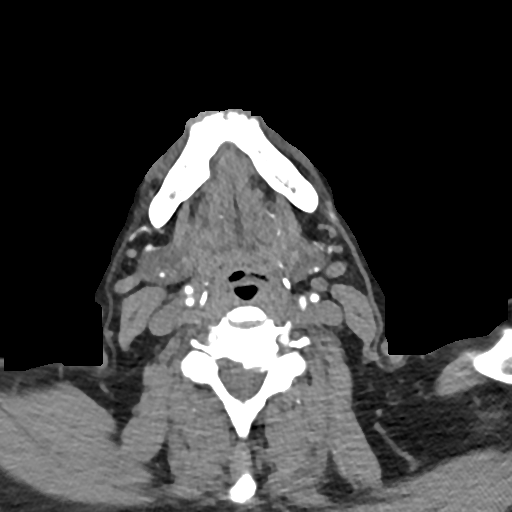
[im 118/177  bone]
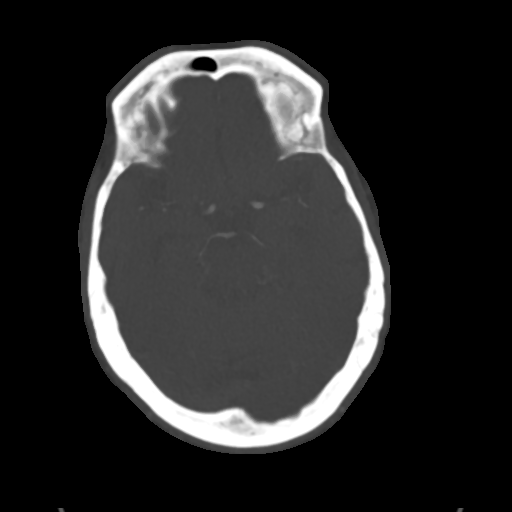

[Series 7: ax thins · axial · 0.39mm/px · z∈[-303,-72]mm · 6 of 356 slices shown]
[im 51/356  soft-tissue]
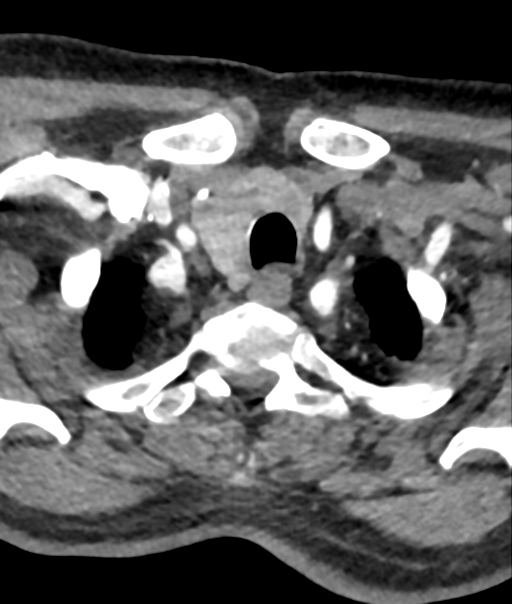
[im 102/356  soft-tissue]
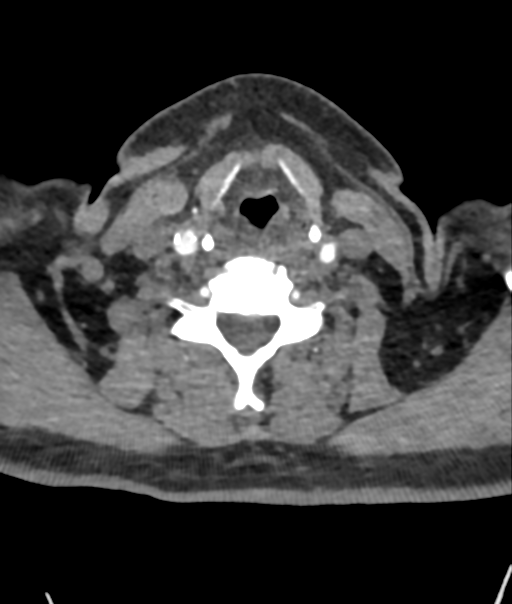
[im 153/356  soft-tissue]
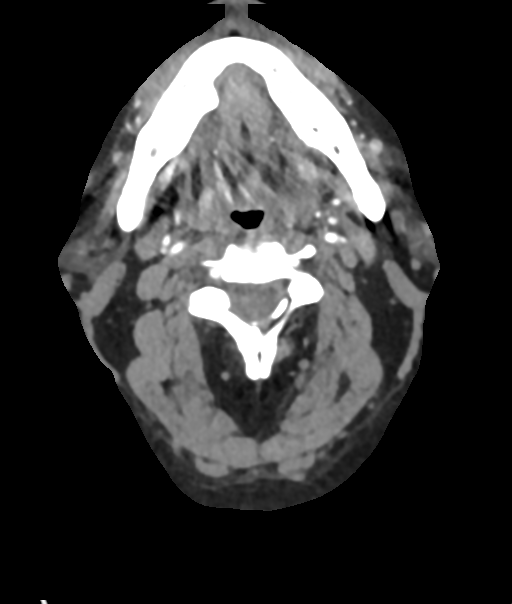
[im 203/356  soft-tissue]
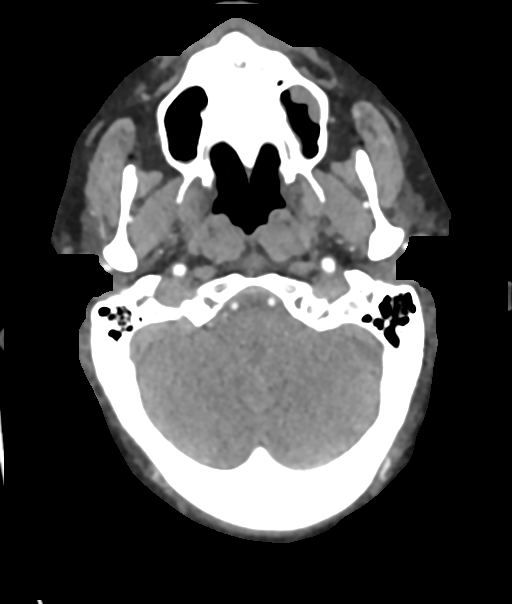
[im 254/356  soft-tissue]
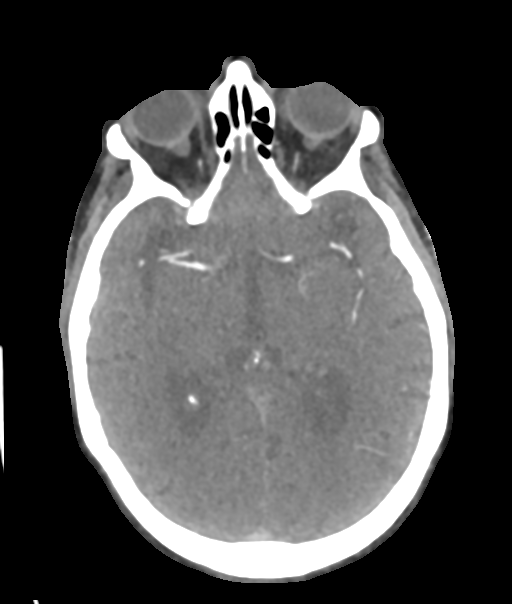
[im 305/356  soft-tissue]
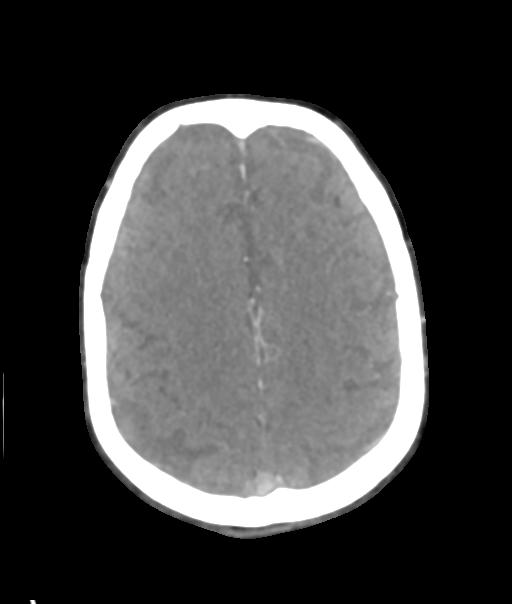

[8 of 33 positions shown; findings below may reference images not displayed]

FINDINGS: CTA NECK

Skeleton: Prior sternotomy. No acute osseous abnormality identified.

Upper chest: Centrilobular emphysema. No superior mediastinal
lymphadenopathy.

Other neck: Mild thyroid goiter on the right. Small 12 millimeter
left parotid gland nodule is stable since [DATE], image
163) and head hypercellular features on MRI. Other neck soft tissues
are within normal limits.

Aortic arch: Calcified aortic atherosclerosis. 3 vessel arch
configuration.

Right carotid system: No brachiocephalic or right CCA origin
stenosis despite plaque. Soft and calcified plaque at the right ICA
origin and bulb with less than 50 % stenosis with respect to the
distal vessel.

Left carotid system: No left CCA origin stenosis. Mildly tortuous
left CCA. Soft and calcified plaque at the left ICA origin and bulb
with less than 50 % stenosis with respect to the distal vessel.

Vertebral arteries:
Tortuous proximal right subclavian artery with soft and calcified
plaque but no stenosis. Normal right vertebral artery origin. Patent
right vertebral artery to the skull base without stenosis.

Mildly tortuous proximal left subclavian artery with soft and
calcified plaque but no stenosis. Patent left vertebral artery
origin without stenosis. Mild left V1 calcified plaque and
tortuosity. Left V2 segment irregularity intermittently throughout
the neck suggesting multifocal soft plaque, but no hemodynamically
significant stenosis identified to the skull base.

CTA HEAD

Posterior circulation: Codominant distal vertebral arteries without
stenosis. Patent vertebrobasilar junction and PICA origins. Patent
basilar artery without stenosis. Normal SCA and right PCA origins.

Moderate to severe irregularity and stenosis redemonstrated at the
left PCA origin and stable from the prior MRA. The left P2 segment
is mildly irregular but the other left PCA branches are within
normal limits. There is contralateral moderate to severe right P2
segment stenosis which appears progressed since [REDACTED], but with
preserved distal right PCA enhancement.

Anterior circulation: Both ICA siphons are patent. Extensive left
siphon plaque including bulky calcified plaque in the cavernous and
proximal supraclinoid segments and soft plaque in the petrous
segment. Mild to moderate subsequent left ICA stenosis appears
stable since [REDACTED]. Normal left ophthalmic artery origin.
Posterior communicating arteries are diminutive or absent.

Right ICA siphon similar soft and calcified plaque with mild to
moderate stenosis stable since [REDACTED]. Normal left ophthalmic
artery origin.

Patent carotid termini. Dominant left and diminutive or absent right
ACA A1 segments. Normal left A1, anterior communicating artery and
bilateral ACA branches.

Left MCA M1 segment and trifurcation are patent with mild
irregularity that appears stable since [REDACTED]. Left MCA branches
are stable.

Right MCA M1 and bifurcation are patent and appear stable since
[REDACTED]. Right MCA branches appear stable.

Venous sinuses: Not well evaluated due to early contrast timing

Anatomic variants: Dominant left ACA A1. Bovine arch configuration.

Review of the MIP images confirms the above findings
IMPRESSION: 1. Negative for emergent large vessel occlusion.
2. Widespread intracranial atherosclerosis which appears largely
stable since the [place] MRA.
- possible increased moderate to severe Right PCA P2 stenosis.
- chronic severe Left PCA origin stenosis.
- up to moderate bilateral ICA siphon stenosis.
3. Atherosclerosis in the neck and Aortic Atherosclerosis
([id number]). No significant cervical carotid or vertebral artery
stenosis identified.
4. A small 12 mm primary salivary gland neoplasm in the left parotid
is stable since [REDACTED]. Recommend outpatient ENT follow-up.

Salient findings communicated to Dr. Rirache at 0562 hours on
11/05/2018 by text page via the [employer] messaging system.

## 2020-09-03 IMAGING — CT CT HEAD CODE STROKE W/O CM
4 series · 16 of 47 positions shown, 18 images · non-contrast
Comparison: Brain MRI and head CT 07/13/2018

CLINICAL DATA: Code stroke. 83-year-old male. History of small
vessel infarcts in 7391.

EXAM:
CT HEAD WITHOUT CONTRAST
TECHNIQUE: Contiguous axial images were obtained from the base of the skull
through the vertex without intravenous contrast.

[Series 3: head wo · axial · 0.46mm/px · z∈[-110,+14]mm · 7 of 35 slices shown, 9 images]
[im 5/35  brain]
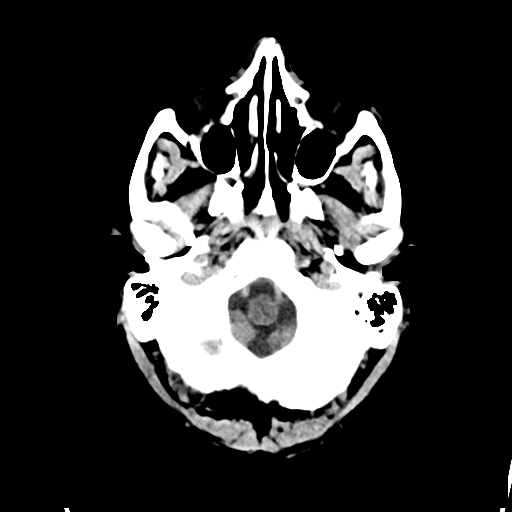
[im 5/35  bone]
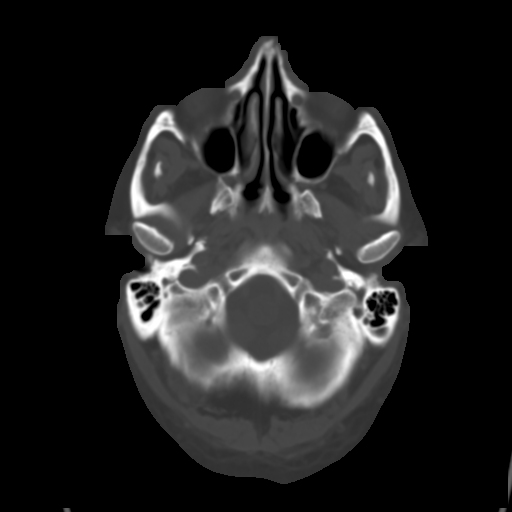
[im 9/35  brain]
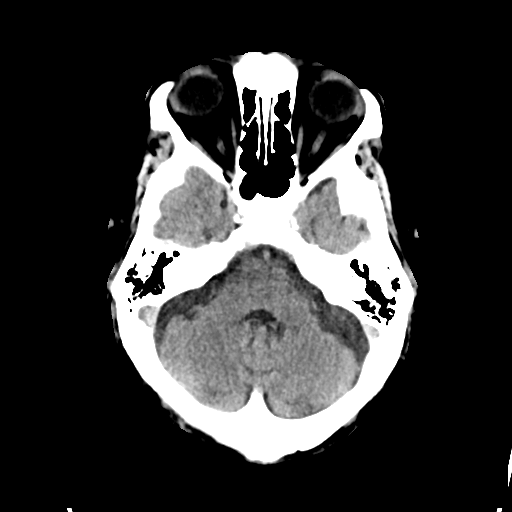
[im 13/35  brain]
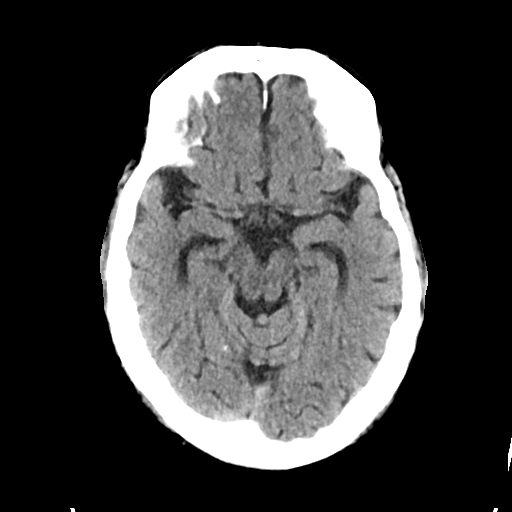
[im 18/35  brain]
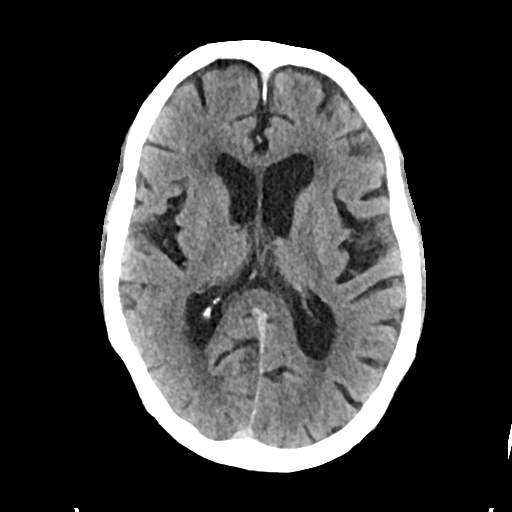
[im 22/35  brain]
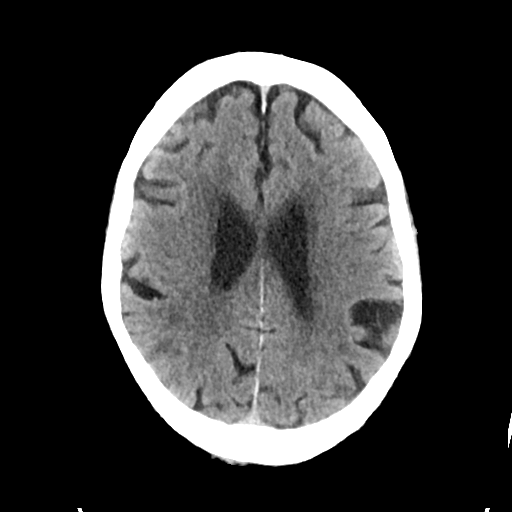
[im 22/35  bone]
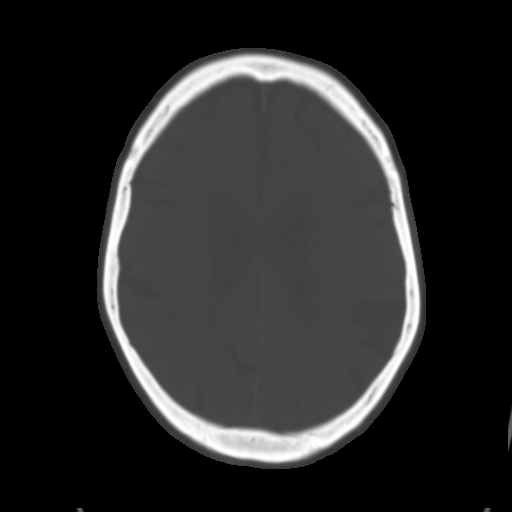
[im 26/35  brain]
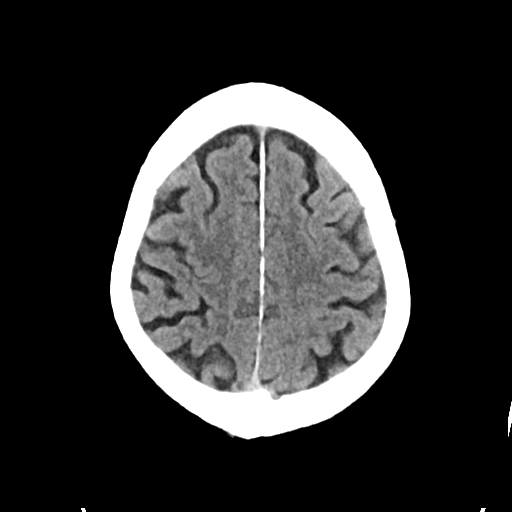
[im 30/35  brain]
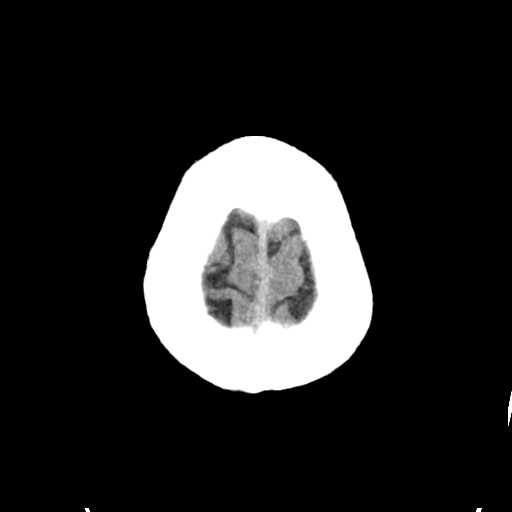

[Series 4: head bone · axial · 0.46mm/px · z∈[-114,-80]mm · 3 of 87 slices shown]
[im 9/87  bone]
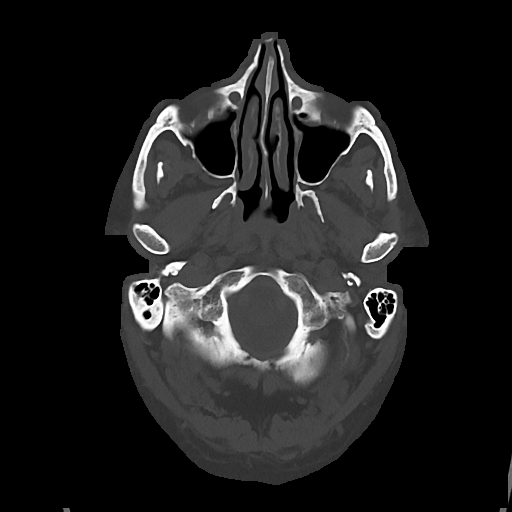
[im 18/87  bone]
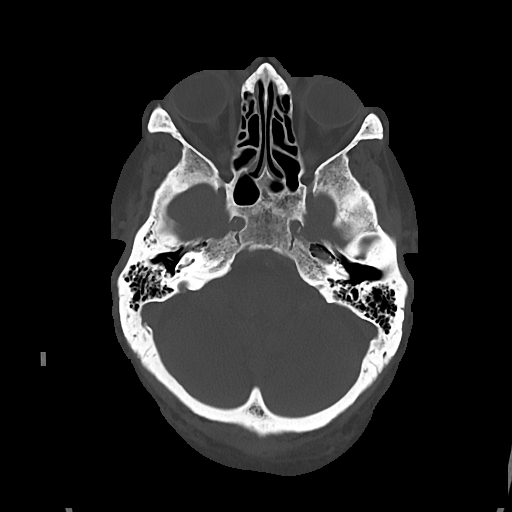
[im 26/87  bone]
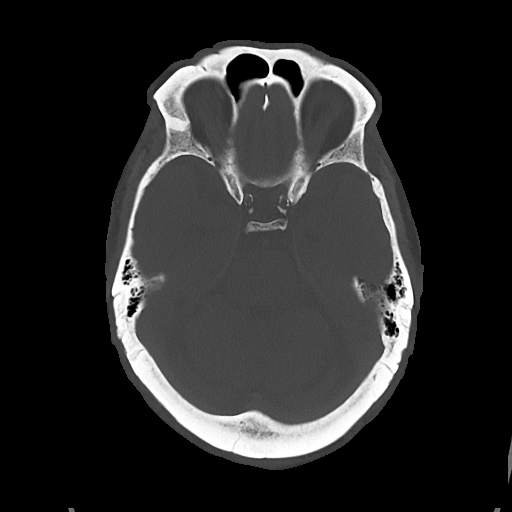

[Series 5: cor soft · coronal · 0.34mm/px · 3 of 77 slices shown]
[im 26/77  brain]
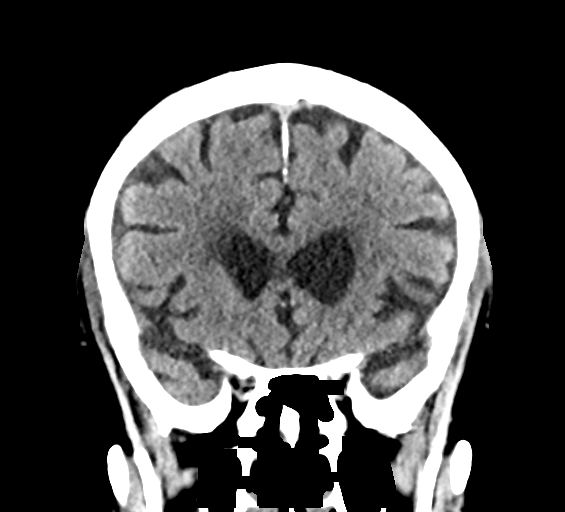
[im 34/77  brain]
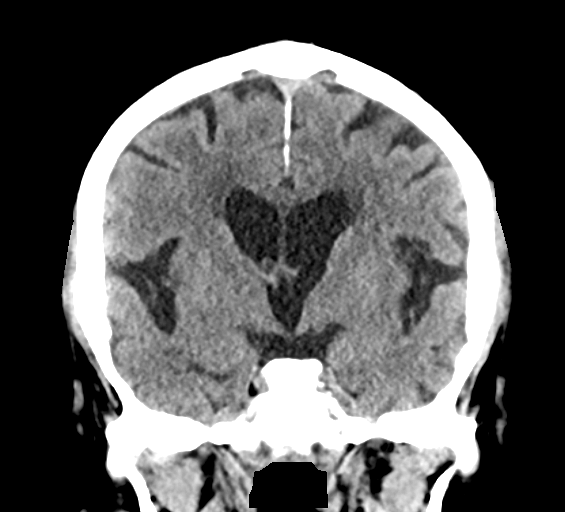
[im 43/77  brain]
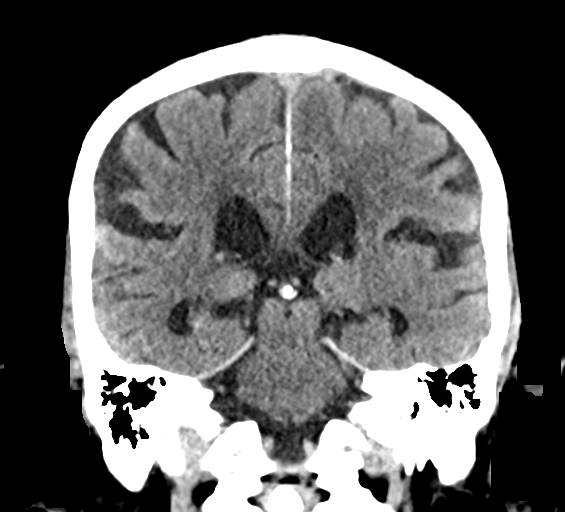

[Series 6: sag soft · sagittal · 0.34mm/px · 3 of 57 slices shown]
[im 19/57  brain]
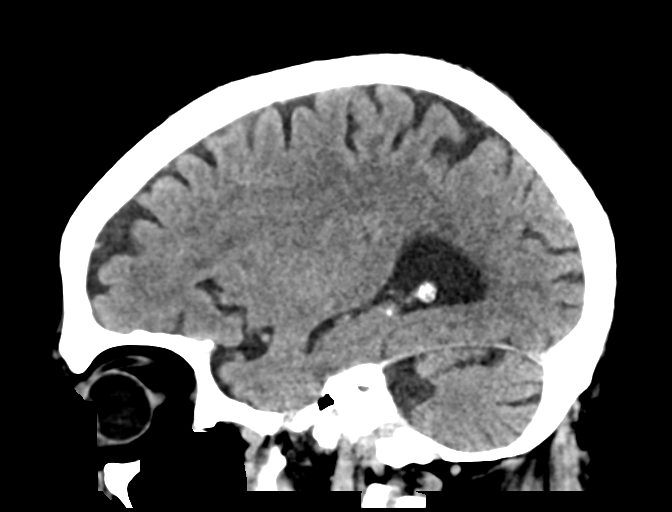
[im 29/57  brain]
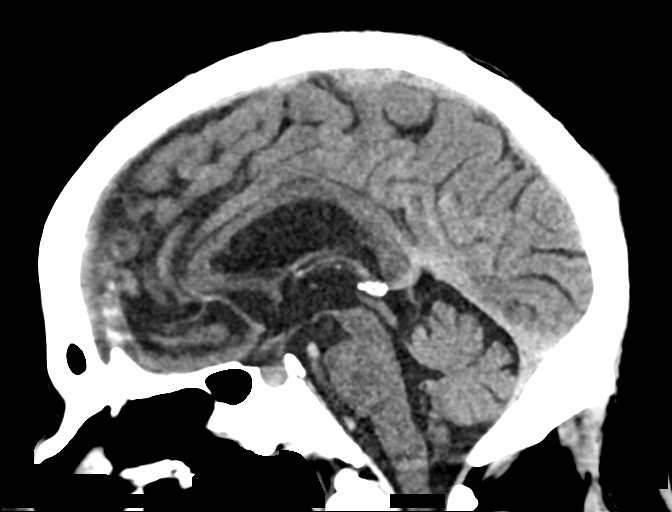
[im 38/57  brain]
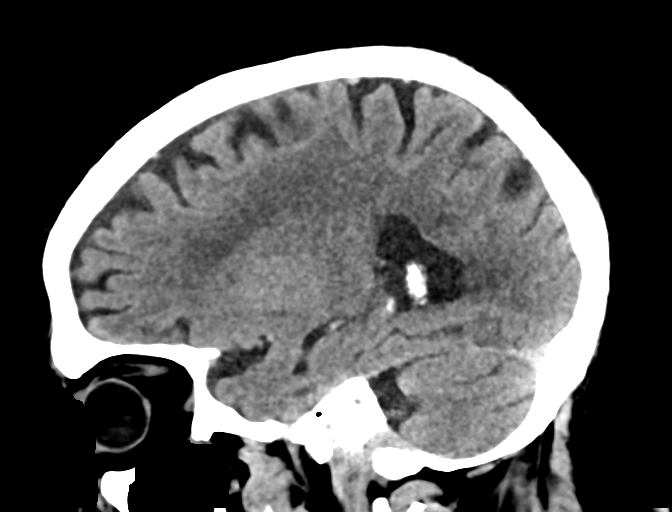

[16 of 47 positions shown; findings below may reference images not displayed]

FINDINGS: Brain: Stable cerebral volume. No midline shift, mass effect, or
evidence of intracranial mass lesion. No acute intracranial
hemorrhage identified. No ventriculomegaly. Evolved right pontine
infarct seen in [REDACTED]. Patchy bilateral cerebral white matter
hypodensity.

Asymmetric left occipital lobe sulcus or less likely chronic
encephalomalacia is stable. Stable gray-white matter differentiation
throughout the brain. No cortically based acute infarct identified.

Vascular: Calcified atherosclerosis at the skull base. No suspicious
intracranial vascular hyperdensity.

Skull: No acute osseous abnormality identified.

Sinuses/Orbits: Visualized paranasal sinuses and mastoids are stable
and well pneumatized.

Other: No acute orbit or scalp soft tissue finding.

ASPECTS (Al[place]ta Stroke Program Early CT Score)

- Ganglionic level infarction (caudate, lentiform nuclei, internal
capsule, insula, M1-M3 cortex): 7

- Supraganglionic infarction (M4-M6 cortex): 3

Total score (0-10 with 10 being normal): 10
IMPRESSION: 1. No acute cortically based infarct or acute intracranial
hemorrhage identified. ASPECTS is 10.
2. Expected evolution of right pontine infarct since [REDACTED].
Chronic white matter disease.
3. These results were communicated to Dr. Apollon at [DATE] on
11/05/2018by text page via the [employer]ION messaging system.

## 2021-03-15 DEATH — deceased
